# Patient Record
Sex: Female | Born: 1935
Health system: Southern US, Community
[De-identification: ages and names within clinical notes are randomized; demographics above are authoritative.]

## PROBLEM LIST (undated history)

## (undated) DIAGNOSIS — K625 Hemorrhage of anus and rectum: Secondary | ICD-10-CM

## (undated) DIAGNOSIS — G4700 Insomnia, unspecified: Secondary | ICD-10-CM

## (undated) DIAGNOSIS — H9319 Tinnitus, unspecified ear: Secondary | ICD-10-CM

## (undated) DIAGNOSIS — D638 Anemia in other chronic diseases classified elsewhere: Secondary | ICD-10-CM

## (undated) DIAGNOSIS — K631 Perforation of intestine (nontraumatic): Secondary | ICD-10-CM

## (undated) DIAGNOSIS — I10 Essential (primary) hypertension: Secondary | ICD-10-CM

## (undated) DIAGNOSIS — K5790 Diverticulosis of intestine, part unspecified, without perforation or abscess without bleeding: Secondary | ICD-10-CM

## (undated) DIAGNOSIS — D649 Anemia, unspecified: Secondary | ICD-10-CM

## (undated) DIAGNOSIS — F419 Anxiety disorder, unspecified: Secondary | ICD-10-CM

## (undated) HISTORY — DX: Anemia, unspecified: D64.9

## (undated) HISTORY — DX: Anxiety disorder, unspecified: F41.9

## (undated) HISTORY — PX: COLONOSCOPY: SHX174

## (undated) HISTORY — DX: Essential (primary) hypertension: I10

## (undated) HISTORY — DX: Anemia in other chronic diseases classified elsewhere: D63.8

## (undated) HISTORY — DX: Diverticulosis of intestine, part unspecified, without perforation or abscess without bleeding: K57.90

## (undated) HISTORY — PX: PARTIAL HYSTERECTOMY: SHX80

## (undated) HISTORY — DX: Insomnia, unspecified: G47.00

---

## 2001-04-29 DIAGNOSIS — K5790 Diverticulosis of intestine, part unspecified, without perforation or abscess without bleeding: Secondary | ICD-10-CM

## 2001-04-29 HISTORY — DX: Diverticulosis of intestine, part unspecified, without perforation or abscess without bleeding: K57.90

## 2001-05-20 ENCOUNTER — Encounter (INDEPENDENT_AMBULATORY_CARE_PROVIDER_SITE_OTHER): Payer: Self-pay | Admitting: Specialist

## 2001-05-20 ENCOUNTER — Other Ambulatory Visit: Admission: RE | Admit: 2001-05-20 | Discharge: 2001-05-20 | Payer: Self-pay | Admitting: Gastroenterology

## 2005-03-27 ENCOUNTER — Inpatient Hospital Stay (HOSPITAL_COMMUNITY): Admission: EM | Admit: 2005-03-27 | Discharge: 2005-03-30 | Payer: Self-pay | Admitting: Emergency Medicine

## 2005-03-28 ENCOUNTER — Ambulatory Visit: Payer: Self-pay | Admitting: Internal Medicine

## 2005-04-09 ENCOUNTER — Ambulatory Visit: Payer: Self-pay | Admitting: Gastroenterology

## 2005-05-14 ENCOUNTER — Ambulatory Visit: Payer: Self-pay | Admitting: Gastroenterology

## 2005-06-12 ENCOUNTER — Ambulatory Visit: Payer: Self-pay | Admitting: Gastroenterology

## 2005-06-12 LAB — CONVERTED CEMR LAB
Fecal Occult Blood: NEGATIVE
OCCULT 1: NEGATIVE
OCCULT 2: NEGATIVE
OCCULT 3: NEGATIVE
OCCULT 4: NEGATIVE
OCCULT 5: NEGATIVE

## 2007-03-08 ENCOUNTER — Ambulatory Visit: Payer: Self-pay | Admitting: Internal Medicine

## 2007-03-09 LAB — CONVERTED CEMR LAB
ALT: 14 units/L (ref 0–40)
AST: 17 units/L (ref 0–37)
Albumin: 3.3 g/dL — ABNORMAL LOW (ref 3.5–5.2)
Alkaline Phosphatase: 40 units/L (ref 39–117)
Basophils Absolute: 0 10*3/uL (ref 0.0–0.1)
Basophils Relative: 0.1 % (ref 0.0–1.0)
Bilirubin, Direct: 0.2 mg/dL (ref 0.0–0.3)
Crystals: NEGATIVE
Eosinophils Relative: 1.3 % (ref 0.0–5.0)
HCT: 31.5 % — ABNORMAL LOW (ref 36.0–46.0)
Hemoglobin, Urine: NEGATIVE
Hemoglobin: 10.7 g/dL — ABNORMAL LOW (ref 12.0–15.0)
LDL Cholesterol: 92 mg/dL (ref 0–99)
Lymphocytes Relative: 18.2 % (ref 12.0–46.0)
MCHC: 33.9 g/dL (ref 30.0–36.0)
Monocytes Relative: 7.8 % (ref 3.0–11.0)
Neutrophils Relative %: 72.6 % (ref 43.0–77.0)
Nitrite: NEGATIVE
Platelets: 311 10*3/uL (ref 150–400)
TSH: 0.97 microintl units/mL (ref 0.35–5.50)
Total Bilirubin: 1.2 mg/dL (ref 0.3–1.2)
Triglycerides: 37 mg/dL (ref 0–149)
Urobilinogen, UA: 0.2 (ref 0.0–1.0)

## 2007-05-28 ENCOUNTER — Ambulatory Visit: Payer: Self-pay | Admitting: Internal Medicine

## 2007-05-28 LAB — CONVERTED CEMR LAB: Creatinine, Ser: 0.8 mg/dL (ref 0.4–1.2)

## 2007-06-02 ENCOUNTER — Ambulatory Visit: Payer: Self-pay | Admitting: Internal Medicine

## 2007-06-07 ENCOUNTER — Ambulatory Visit: Payer: Self-pay | Admitting: Internal Medicine

## 2007-06-07 LAB — CONVERTED CEMR LAB
Albumin ELP: 48.5 % — ABNORMAL LOW (ref 55.8–66.1)
Alpha-1-Globulin: 4.8 % (ref 2.9–4.9)
BUN: 12 mg/dL (ref 6–23)
Beta Globulin: 5.4 % (ref 4.7–7.2)
Calcium: 9.6 mg/dL (ref 8.4–10.5)
Chloride: 107 meq/L (ref 96–112)
Creatinine, Ser: 0.7 mg/dL (ref 0.4–1.2)
Potassium: 4.1 meq/L (ref 3.5–5.1)
Total Protein, Serum Electrophoresis: 8.6 g/dL — ABNORMAL HIGH (ref 6.0–8.3)
Vitamin B-12: 1476 pg/mL — ABNORMAL HIGH (ref 211–911)

## 2007-06-19 ENCOUNTER — Emergency Department (HOSPITAL_COMMUNITY): Admission: EM | Admit: 2007-06-19 | Discharge: 2007-06-19 | Payer: Self-pay | Admitting: Internal Medicine

## 2007-06-21 ENCOUNTER — Emergency Department (HOSPITAL_COMMUNITY): Admission: EM | Admit: 2007-06-21 | Discharge: 2007-06-21 | Payer: Self-pay | Admitting: *Deleted

## 2007-06-22 ENCOUNTER — Inpatient Hospital Stay (HOSPITAL_COMMUNITY): Admission: EM | Admit: 2007-06-22 | Discharge: 2007-06-24 | Payer: Self-pay | Admitting: Emergency Medicine

## 2007-06-22 ENCOUNTER — Ambulatory Visit: Payer: Self-pay | Admitting: Internal Medicine

## 2007-07-06 ENCOUNTER — Encounter: Admission: RE | Admit: 2007-07-06 | Discharge: 2007-07-06 | Payer: Self-pay | Admitting: Internal Medicine

## 2007-07-08 ENCOUNTER — Ambulatory Visit: Payer: Self-pay | Admitting: Internal Medicine

## 2007-07-09 ENCOUNTER — Ambulatory Visit: Payer: Self-pay | Admitting: Oncology

## 2007-07-22 ENCOUNTER — Telehealth: Payer: Self-pay | Admitting: Internal Medicine

## 2007-07-28 ENCOUNTER — Encounter (INDEPENDENT_AMBULATORY_CARE_PROVIDER_SITE_OTHER): Payer: Self-pay | Admitting: Diagnostic Radiology

## 2007-07-28 ENCOUNTER — Ambulatory Visit (HOSPITAL_COMMUNITY): Admission: RE | Admit: 2007-07-28 | Discharge: 2007-07-28 | Payer: Self-pay | Admitting: Internal Medicine

## 2007-07-28 ENCOUNTER — Encounter: Payer: Self-pay | Admitting: Internal Medicine

## 2007-08-02 ENCOUNTER — Telehealth: Payer: Self-pay | Admitting: Internal Medicine

## 2007-08-11 LAB — CBC WITH DIFFERENTIAL/PLATELET
BASO%: 0.7 % (ref 0.0–2.0)
Basophils Absolute: 0 10*3/uL (ref 0.0–0.1)
EOS%: 0.7 % (ref 0.0–7.0)
HGB: 11.3 g/dL — ABNORMAL LOW (ref 11.6–15.9)
MCH: 30.3 pg (ref 26.0–34.0)
MCHC: 33.6 g/dL (ref 32.0–36.0)
MCV: 90.3 fL (ref 81.0–101.0)
MONO%: 8.5 % (ref 0.0–13.0)
NEUT%: 72.5 % (ref 39.6–76.8)
RDW: 15 % — ABNORMAL HIGH (ref 11.3–14.5)

## 2007-08-11 LAB — MORPHOLOGY

## 2007-08-13 LAB — COMPREHENSIVE METABOLIC PANEL
Albumin: 4.1 g/dL (ref 3.5–5.2)
CO2: 28 mEq/L (ref 19–32)
Glucose, Bld: 90 mg/dL (ref 70–99)
Potassium: 3.8 mEq/L (ref 3.5–5.3)
Sodium: 139 mEq/L (ref 135–145)
Total Protein: 8.3 g/dL (ref 6.0–8.3)

## 2007-08-13 LAB — KAPPA/LAMBDA LIGHT CHAINS: Kappa free light chain: 1.65 mg/dL (ref 0.33–1.94)

## 2007-08-13 LAB — LACTATE DEHYDROGENASE: LDH: 154 U/L (ref 94–250)

## 2007-08-13 LAB — BETA 2 MICROGLOBULIN, SERUM: Beta-2 Microglobulin: 1.79 mg/L — ABNORMAL HIGH (ref 1.01–1.73)

## 2007-08-13 LAB — IMMUNOFIXATION ELECTROPHORESIS: Total Protein, Serum Electrophoresis: 8.3 g/dL (ref 6.0–8.3)

## 2007-08-16 ENCOUNTER — Encounter: Payer: Self-pay | Admitting: Internal Medicine

## 2007-08-16 ENCOUNTER — Ambulatory Visit (HOSPITAL_COMMUNITY): Admission: RE | Admit: 2007-08-16 | Discharge: 2007-08-16 | Payer: Self-pay | Admitting: Oncology

## 2007-08-18 LAB — UIFE/LIGHT CHAINS/TP QN, 24-HR UR
Albumin, U: DETECTED
Alpha 1, Urine: DETECTED — AB
Alpha 2, Urine: DETECTED — AB
Free Lambda Excretion/Day: 1.8 mg/d
Gamma Globulin, Urine: DETECTED — AB
Time: 24 hours

## 2007-09-10 ENCOUNTER — Ambulatory Visit: Payer: Self-pay | Admitting: Oncology

## 2007-10-22 ENCOUNTER — Encounter: Payer: Self-pay | Admitting: Internal Medicine

## 2007-10-28 ENCOUNTER — Telehealth: Payer: Self-pay | Admitting: Internal Medicine

## 2007-11-04 ENCOUNTER — Ambulatory Visit (HOSPITAL_COMMUNITY): Admission: RE | Admit: 2007-11-04 | Discharge: 2007-11-04 | Payer: Self-pay | Admitting: Oncology

## 2007-11-04 ENCOUNTER — Ambulatory Visit: Payer: Self-pay | Admitting: Oncology

## 2007-11-04 ENCOUNTER — Encounter: Payer: Self-pay | Admitting: Oncology

## 2007-11-04 ENCOUNTER — Encounter: Payer: Self-pay | Admitting: Internal Medicine

## 2007-11-17 ENCOUNTER — Ambulatory Visit: Payer: Self-pay | Admitting: Oncology

## 2007-11-19 LAB — COMPREHENSIVE METABOLIC PANEL
Albumin: 4.2 g/dL (ref 3.5–5.2)
CO2: 26 mEq/L (ref 19–32)
Glucose, Bld: 103 mg/dL — ABNORMAL HIGH (ref 70–99)
Potassium: 3.7 mEq/L (ref 3.5–5.3)
Sodium: 140 mEq/L (ref 135–145)
Total Protein: 8.6 g/dL — ABNORMAL HIGH (ref 6.0–8.3)

## 2007-11-19 LAB — CBC WITH DIFFERENTIAL/PLATELET
Eosinophils Absolute: 0 10*3/uL (ref 0.0–0.5)
HGB: 12.1 g/dL (ref 11.6–15.9)
LYMPH%: 15.3 % (ref 14.0–48.0)
MONO#: 0.3 10*3/uL (ref 0.1–0.9)
NEUT#: 4.3 10*3/uL (ref 1.5–6.5)
Platelets: 295 10*3/uL (ref 145–400)
RBC: 4.03 10*6/uL (ref 3.70–5.32)
RDW: 14.3 % (ref 11.3–14.5)
WBC: 5.4 10*3/uL (ref 3.9–10.0)

## 2007-11-25 ENCOUNTER — Encounter: Payer: Self-pay | Admitting: Internal Medicine

## 2007-11-25 DIAGNOSIS — F062 Psychotic disorder with delusions due to known physiological condition: Secondary | ICD-10-CM

## 2007-11-25 DIAGNOSIS — I10 Essential (primary) hypertension: Secondary | ICD-10-CM

## 2007-12-16 ENCOUNTER — Telehealth: Payer: Self-pay | Admitting: Internal Medicine

## 2008-02-29 ENCOUNTER — Telehealth (INDEPENDENT_AMBULATORY_CARE_PROVIDER_SITE_OTHER): Payer: Self-pay | Admitting: *Deleted

## 2008-03-07 ENCOUNTER — Ambulatory Visit: Payer: Self-pay | Admitting: Oncology

## 2008-03-08 ENCOUNTER — Ambulatory Visit: Payer: Self-pay | Admitting: Internal Medicine

## 2008-03-08 DIAGNOSIS — C9000 Multiple myeloma not having achieved remission: Secondary | ICD-10-CM | POA: Insufficient documentation

## 2008-03-08 DIAGNOSIS — D649 Anemia, unspecified: Secondary | ICD-10-CM

## 2008-03-08 DIAGNOSIS — G4700 Insomnia, unspecified: Secondary | ICD-10-CM

## 2008-03-20 ENCOUNTER — Ambulatory Visit: Payer: Self-pay | Admitting: Internal Medicine

## 2008-03-20 DIAGNOSIS — R42 Dizziness and giddiness: Secondary | ICD-10-CM

## 2008-03-20 DIAGNOSIS — R000 Tachycardia, unspecified: Secondary | ICD-10-CM

## 2008-03-21 ENCOUNTER — Encounter: Payer: Self-pay | Admitting: Internal Medicine

## 2008-03-21 DIAGNOSIS — F411 Generalized anxiety disorder: Secondary | ICD-10-CM

## 2008-03-21 LAB — CBC WITH DIFFERENTIAL/PLATELET
BASO%: 0 % (ref 0.0–2.0)
Basophils Absolute: 0 10*3/uL (ref 0.0–0.1)
EOS%: 0 % (ref 0.0–7.0)
HCT: 37.5 % (ref 34.8–46.6)
HGB: 12.4 g/dL (ref 11.6–15.9)
LYMPH%: 2.9 % — ABNORMAL LOW (ref 14.0–48.0)
MCH: 29.6 pg (ref 26.0–34.0)
MCHC: 33.1 g/dL (ref 32.0–36.0)
MCV: 89.3 fL (ref 81.0–101.0)
MONO%: 0.7 % (ref 0.0–13.0)
NEUT%: 96.4 % — ABNORMAL HIGH (ref 39.6–76.8)
Platelets: 291 10*3/uL (ref 145–400)

## 2008-03-21 LAB — COMPREHENSIVE METABOLIC PANEL
Albumin: 4.1 g/dL (ref 3.5–5.2)
Alkaline Phosphatase: 53 U/L (ref 39–117)
CO2: 25 mEq/L (ref 19–32)
Calcium: 9.7 mg/dL (ref 8.4–10.5)
Chloride: 104 mEq/L (ref 96–112)
Glucose, Bld: 117 mg/dL — ABNORMAL HIGH (ref 70–99)
Potassium: 3.6 mEq/L (ref 3.5–5.3)
Sodium: 141 mEq/L (ref 135–145)
Total Protein: 7.7 g/dL (ref 6.0–8.3)

## 2008-03-21 LAB — MORPHOLOGY

## 2008-03-21 LAB — LACTATE DEHYDROGENASE: LDH: 156 U/L (ref 94–250)

## 2008-03-22 ENCOUNTER — Encounter: Payer: Self-pay | Admitting: Internal Medicine

## 2008-03-24 LAB — IMMUNOFIXATION ELECTROPHORESIS
IgG (Immunoglobin G), Serum: 1770 mg/dL — ABNORMAL HIGH (ref 694–1618)
Total Protein, Serum Electrophoresis: 7.5 g/dL (ref 6.0–8.3)

## 2008-03-24 LAB — KAPPA/LAMBDA LIGHT CHAINS: Kappa free light chain: 2 mg/dL — ABNORMAL HIGH (ref 0.33–1.94)

## 2008-03-29 ENCOUNTER — Telehealth: Payer: Self-pay | Admitting: Internal Medicine

## 2008-04-14 ENCOUNTER — Ambulatory Visit: Payer: Self-pay | Admitting: Oncology

## 2008-04-21 ENCOUNTER — Encounter: Payer: Self-pay | Admitting: Internal Medicine

## 2008-04-21 LAB — MORPHOLOGY

## 2008-04-21 LAB — CBC WITH DIFFERENTIAL/PLATELET
EOS%: 0.4 % (ref 0.0–7.0)
Eosinophils Absolute: 0 10*3/uL (ref 0.0–0.5)
MCV: 90.6 fL (ref 81.0–101.0)
MONO%: 10.1 % (ref 0.0–13.0)
NEUT#: 2.7 10*3/uL (ref 1.5–6.5)
RBC: 3.66 10*6/uL — ABNORMAL LOW (ref 3.70–5.32)
RDW: 16.5 % — ABNORMAL HIGH (ref 11.3–14.5)

## 2008-04-25 LAB — IMMUNOFIXATION ELECTROPHORESIS
IgA: 332 mg/dL (ref 68–378)
IgM, Serum: 27 mg/dL — ABNORMAL LOW (ref 60–263)
Total Protein, Serum Electrophoresis: 6.6 g/dL (ref 6.0–8.3)

## 2008-04-25 LAB — COMPREHENSIVE METABOLIC PANEL
AST: 12 U/L (ref 0–37)
Alkaline Phosphatase: 49 U/L (ref 39–117)
BUN: 12 mg/dL (ref 6–23)
Creatinine, Ser: 0.69 mg/dL (ref 0.40–1.20)
Potassium: 3.8 mEq/L (ref 3.5–5.3)
Total Bilirubin: 1.2 mg/dL (ref 0.3–1.2)

## 2008-04-25 LAB — LACTATE DEHYDROGENASE: LDH: 140 U/L (ref 94–250)

## 2008-05-05 ENCOUNTER — Encounter: Payer: Self-pay | Admitting: Internal Medicine

## 2008-05-05 LAB — CBC WITH DIFFERENTIAL/PLATELET
BASO%: 0.8 % (ref 0.0–2.0)
EOS%: 0.4 % (ref 0.0–7.0)
MCH: 30.5 pg (ref 26.0–34.0)
MCHC: 33.3 g/dL (ref 32.0–36.0)
RDW: 16.6 % — ABNORMAL HIGH (ref 11.3–14.5)
WBC: 3.3 10*3/uL — ABNORMAL LOW (ref 3.9–10.0)
lymph#: 0.4 10*3/uL — ABNORMAL LOW (ref 0.9–3.3)

## 2008-05-16 ENCOUNTER — Encounter: Payer: Self-pay | Admitting: Internal Medicine

## 2008-05-16 LAB — CBC WITH DIFFERENTIAL/PLATELET
BASO%: 0.1 % (ref 0.0–2.0)
Basophils Absolute: 0 10e3/uL (ref 0.0–0.1)
EOS%: 0.2 % (ref 0.0–7.0)
Eosinophils Absolute: 0 10e3/uL (ref 0.0–0.5)
HCT: 37.1 % (ref 34.8–46.6)
HGB: 12.2 g/dL (ref 11.6–15.9)
LYMPH%: 6.3 % — ABNORMAL LOW (ref 14.0–48.0)
MCH: 30.7 pg (ref 26.0–34.0)
MCHC: 33 g/dL (ref 32.0–36.0)
MCV: 92.9 fL (ref 81.0–101.0)
MONO#: 0.2 10e3/uL (ref 0.1–0.9)
MONO%: 4.5 % (ref 0.0–13.0)
NEUT#: 4.8 10e3/uL (ref 1.5–6.5)
NEUT%: 88.9 % — ABNORMAL HIGH (ref 39.6–76.8)
Platelets: 197 10e3/uL (ref 145–400)
RBC: 3.99 10e6/uL (ref 3.70–5.32)
RDW: 16.6 % — ABNORMAL HIGH (ref 11.3–14.5)
WBC: 5.4 10e3/uL (ref 3.9–10.0)
lymph#: 0.3 10e3/uL — ABNORMAL LOW (ref 0.9–3.3)

## 2008-05-16 LAB — BASIC METABOLIC PANEL WITH GFR
BUN: 21 mg/dL (ref 6–23)
CO2: 24 meq/L (ref 19–32)
Calcium: 9.4 mg/dL (ref 8.4–10.5)
Chloride: 107 meq/L (ref 96–112)
Creatinine, Ser: 0.97 mg/dL (ref 0.40–1.20)
Glucose, Bld: 103 mg/dL — ABNORMAL HIGH (ref 70–99)
Potassium: 3.7 meq/L (ref 3.5–5.3)
Sodium: 143 meq/L (ref 135–145)

## 2008-06-06 ENCOUNTER — Ambulatory Visit: Payer: Self-pay | Admitting: Oncology

## 2008-06-09 ENCOUNTER — Encounter: Payer: Self-pay | Admitting: Internal Medicine

## 2008-06-09 LAB — CBC WITH DIFFERENTIAL/PLATELET
BASO%: 0.3 % (ref 0.0–2.0)
LYMPH%: 14.8 % (ref 14.0–48.0)
MCHC: 33.5 g/dL (ref 32.0–36.0)
MONO#: 0.3 10*3/uL (ref 0.1–0.9)
Platelets: 201 10*3/uL (ref 145–400)
RBC: 3.74 10*6/uL (ref 3.70–5.32)
RDW: 16.2 % — ABNORMAL HIGH (ref 11.3–14.5)
WBC: 3.1 10*3/uL — ABNORMAL LOW (ref 3.9–10.0)

## 2008-06-13 LAB — COMPREHENSIVE METABOLIC PANEL
ALT: 8 U/L (ref 0–35)
AST: 15 U/L (ref 0–37)
Albumin: 4.4 g/dL (ref 3.5–5.2)
Alkaline Phosphatase: 60 U/L (ref 39–117)
Glucose, Bld: 122 mg/dL — ABNORMAL HIGH (ref 70–99)
Potassium: 3.9 mEq/L (ref 3.5–5.3)
Sodium: 143 mEq/L (ref 135–145)
Total Bilirubin: 0.9 mg/dL (ref 0.3–1.2)
Total Protein: 7.7 g/dL (ref 6.0–8.3)

## 2008-06-13 LAB — KAPPA/LAMBDA LIGHT CHAINS
Kappa free light chain: 1.56 mg/dL (ref 0.33–1.94)
Kappa:Lambda Ratio: 0.67 (ref 0.26–1.65)

## 2008-06-13 LAB — IMMUNOFIXATION ELECTROPHORESIS
IgM, Serum: 27 mg/dL — ABNORMAL LOW (ref 60–263)
Total Protein, Serum Electrophoresis: 7.7 g/dL (ref 6.0–8.3)

## 2008-06-16 LAB — CBC WITH DIFFERENTIAL/PLATELET
BASO%: 0.2 % (ref 0.0–2.0)
EOS%: 0 % (ref 0.0–7.0)
HCT: 34.4 % — ABNORMAL LOW (ref 34.8–46.6)
LYMPH%: 3.8 % — ABNORMAL LOW (ref 14.0–48.0)
MCH: 31 pg (ref 26.0–34.0)
MCHC: 33.3 g/dL (ref 32.0–36.0)
NEUT%: 90.4 % — ABNORMAL HIGH (ref 39.6–76.8)
RBC: 3.7 10*6/uL (ref 3.70–5.32)
lymph#: 0.3 10*3/uL — ABNORMAL LOW (ref 0.9–3.3)

## 2008-06-30 LAB — CBC WITH DIFFERENTIAL/PLATELET
BASO%: 0.1 % (ref 0.0–2.0)
EOS%: 0.3 % (ref 0.0–7.0)
HCT: 33.9 % — ABNORMAL LOW (ref 34.8–46.6)
MCH: 31.5 pg (ref 26.0–34.0)
MCHC: 33.7 g/dL (ref 32.0–36.0)
NEUT%: 80.4 % — ABNORMAL HIGH (ref 39.6–76.8)
lymph#: 0.3 10*3/uL — ABNORMAL LOW (ref 0.9–3.3)

## 2008-06-30 LAB — MORPHOLOGY

## 2008-07-12 ENCOUNTER — Encounter: Payer: Self-pay | Admitting: Internal Medicine

## 2008-07-12 LAB — CBC WITH DIFFERENTIAL/PLATELET
Basophils Absolute: 0 10*3/uL (ref 0.0–0.1)
EOS%: 0.2 % (ref 0.0–7.0)
HCT: 35.7 % (ref 34.8–46.6)
HGB: 11.8 g/dL (ref 11.6–15.9)
MCH: 31.1 pg (ref 26.0–34.0)
MCV: 94.2 fL (ref 81.0–101.0)
MONO%: 4.3 % (ref 0.0–13.0)
NEUT%: 86.9 % — ABNORMAL HIGH (ref 39.6–76.8)

## 2008-07-14 LAB — COMPREHENSIVE METABOLIC PANEL
ALT: 13 U/L (ref 0–35)
AST: 17 U/L (ref 0–37)
Albumin: 4.5 g/dL (ref 3.5–5.2)
Alkaline Phosphatase: 56 U/L (ref 39–117)
Potassium: 3.8 mEq/L (ref 3.5–5.3)
Sodium: 143 mEq/L (ref 135–145)
Total Protein: 7.4 g/dL (ref 6.0–8.3)

## 2008-07-14 LAB — KAPPA/LAMBDA LIGHT CHAINS
Kappa free light chain: 0.81 mg/dL (ref 0.33–1.94)
Kappa:Lambda Ratio: 0.61 (ref 0.26–1.65)
Lambda Free Lght Chn: 1.32 mg/dL (ref 0.57–2.63)

## 2008-07-14 LAB — IMMUNOFIXATION ELECTROPHORESIS

## 2008-07-31 ENCOUNTER — Ambulatory Visit: Payer: Self-pay | Admitting: Oncology

## 2008-08-02 LAB — CBC WITH DIFFERENTIAL/PLATELET
BASO%: 0.2 % (ref 0.0–2.0)
Eosinophils Absolute: 0 10*3/uL (ref 0.0–0.5)
HCT: 33.9 % — ABNORMAL LOW (ref 34.8–46.6)
LYMPH%: 10 % — ABNORMAL LOW (ref 14.0–48.0)
MCHC: 33.7 g/dL (ref 32.0–36.0)
MCV: 94.2 fL (ref 81.0–101.0)
MONO#: 0.3 10*3/uL (ref 0.1–0.9)
MONO%: 10 % (ref 0.0–13.0)
NEUT%: 79.6 % — ABNORMAL HIGH (ref 39.6–76.8)
Platelets: 215 10*3/uL (ref 145–400)
WBC: 2.6 10*3/uL — ABNORMAL LOW (ref 3.9–10.0)

## 2008-08-03 LAB — COMPREHENSIVE METABOLIC PANEL
Albumin: 4.3 g/dL (ref 3.5–5.2)
BUN: 14 mg/dL (ref 6–23)
CO2: 25 mEq/L (ref 19–32)
Glucose, Bld: 114 mg/dL — ABNORMAL HIGH (ref 70–99)
Potassium: 3.8 mEq/L (ref 3.5–5.3)
Sodium: 142 mEq/L (ref 135–145)
Total Protein: 7.1 g/dL (ref 6.0–8.3)

## 2008-08-03 LAB — IGG, IGA, IGM
IgA: 328 mg/dL (ref 68–378)
IgG (Immunoglobin G), Serum: 1090 mg/dL (ref 694–1618)
IgM, Serum: 23 mg/dL — ABNORMAL LOW (ref 60–263)

## 2008-08-03 LAB — LACTATE DEHYDROGENASE: LDH: 170 U/L (ref 94–250)

## 2008-08-03 LAB — KAPPA/LAMBDA LIGHT CHAINS
Kappa:Lambda Ratio: 1.28 (ref 0.26–1.65)
Lambda Free Lght Chn: 1.51 mg/dL (ref 0.57–2.63)

## 2008-08-12 ENCOUNTER — Encounter: Payer: Self-pay | Admitting: Internal Medicine

## 2008-08-16 ENCOUNTER — Telehealth: Payer: Self-pay | Admitting: Internal Medicine

## 2008-09-26 ENCOUNTER — Telehealth: Payer: Self-pay | Admitting: Internal Medicine

## 2008-10-02 ENCOUNTER — Ambulatory Visit: Payer: Self-pay | Admitting: Oncology

## 2008-10-04 LAB — CBC WITH DIFFERENTIAL/PLATELET
BASO%: 0.6 % (ref 0.0–2.0)
Basophils Absolute: 0 10*3/uL (ref 0.0–0.1)
EOS%: 0.4 % (ref 0.0–7.0)
HGB: 11.7 g/dL (ref 11.6–15.9)
MCH: 32 pg (ref 26.0–34.0)
MCHC: 33.3 g/dL (ref 32.0–36.0)
MCV: 96.1 fL (ref 81.0–101.0)
MONO%: 7.9 % (ref 0.0–13.0)
NEUT%: 77.5 % — ABNORMAL HIGH (ref 39.6–76.8)
RDW: 14.1 % (ref 11.3–14.5)
lymph#: 0.4 10*3/uL — ABNORMAL LOW (ref 0.9–3.3)

## 2008-10-04 LAB — MORPHOLOGY

## 2008-10-06 LAB — IGG, IGA, IGM
IgA: 397 mg/dL — ABNORMAL HIGH (ref 68–378)
IgG (Immunoglobin G), Serum: 1270 mg/dL (ref 694–1618)
IgM, Serum: 35 mg/dL — ABNORMAL LOW (ref 60–263)

## 2008-10-06 LAB — COMPREHENSIVE METABOLIC PANEL
Albumin: 4.5 g/dL (ref 3.5–5.2)
BUN: 15 mg/dL (ref 6–23)
CO2: 27 mEq/L (ref 19–32)
Glucose, Bld: 112 mg/dL — ABNORMAL HIGH (ref 70–99)
Potassium: 3.8 mEq/L (ref 3.5–5.3)
Sodium: 145 mEq/L (ref 135–145)
Total Protein: 7.4 g/dL (ref 6.0–8.3)

## 2008-10-06 LAB — KAPPA/LAMBDA LIGHT CHAINS: Kappa free light chain: 2.54 mg/dL — ABNORMAL HIGH (ref 0.33–1.94)

## 2008-11-29 ENCOUNTER — Ambulatory Visit: Payer: Self-pay | Admitting: Oncology

## 2008-12-01 ENCOUNTER — Encounter: Payer: Self-pay | Admitting: Internal Medicine

## 2008-12-01 LAB — CBC WITH DIFFERENTIAL/PLATELET
Basophils Absolute: 0 10*3/uL (ref 0.0–0.1)
Eosinophils Absolute: 0 10*3/uL (ref 0.0–0.5)
HCT: 35.2 % (ref 34.8–46.6)
HGB: 11.7 g/dL (ref 11.6–15.9)
MCV: 94.8 fL (ref 79.5–101.0)
MONO%: 7 % (ref 0.0–14.0)
NEUT#: 2.6 10*3/uL (ref 1.5–6.5)
NEUT%: 79.9 % — ABNORMAL HIGH (ref 38.4–76.8)
RDW: 13.7 % (ref 11.2–14.5)
lymph#: 0.4 10*3/uL — ABNORMAL LOW (ref 0.9–3.3)

## 2008-12-01 LAB — MORPHOLOGY: PLT EST: ADEQUATE

## 2008-12-05 LAB — COMPREHENSIVE METABOLIC PANEL
ALT: 12 U/L (ref 0–35)
Alkaline Phosphatase: 56 U/L (ref 39–117)
Potassium: 3.6 mEq/L (ref 3.5–5.3)
Sodium: 141 mEq/L (ref 135–145)
Total Bilirubin: 0.9 mg/dL (ref 0.3–1.2)
Total Protein: 7.1 g/dL (ref 6.0–8.3)

## 2008-12-05 LAB — IMMUNOFIXATION ELECTROPHORESIS
IgG (Immunoglobin G), Serum: 1290 mg/dL (ref 694–1618)
IgM, Serum: 29 mg/dL — ABNORMAL LOW (ref 60–263)
Total Protein, Serum Electrophoresis: 7.1 g/dL (ref 6.0–8.3)

## 2008-12-05 LAB — KAPPA/LAMBDA LIGHT CHAINS: Kappa:Lambda Ratio: 0.82 (ref 0.26–1.65)

## 2008-12-22 ENCOUNTER — Ambulatory Visit (HOSPITAL_COMMUNITY): Admission: RE | Admit: 2008-12-22 | Discharge: 2008-12-22 | Payer: Self-pay | Admitting: Oncology

## 2009-01-03 ENCOUNTER — Telehealth: Payer: Self-pay | Admitting: Internal Medicine

## 2009-01-08 ENCOUNTER — Telehealth: Payer: Self-pay | Admitting: Internal Medicine

## 2009-02-07 ENCOUNTER — Ambulatory Visit: Payer: Self-pay | Admitting: Oncology

## 2009-02-09 LAB — BASIC METABOLIC PANEL
BUN: 13 mg/dL (ref 6–23)
CO2: 28 mEq/L (ref 19–32)
Chloride: 106 mEq/L (ref 96–112)
Creatinine, Ser: 0.7 mg/dL (ref 0.40–1.20)
Glucose, Bld: 141 mg/dL — ABNORMAL HIGH (ref 70–99)
Potassium: 3.7 mEq/L (ref 3.5–5.3)

## 2009-02-09 LAB — CBC WITH DIFFERENTIAL/PLATELET
Basophils Absolute: 0 10*3/uL (ref 0.0–0.1)
EOS%: 0.6 % (ref 0.0–7.0)
Eosinophils Absolute: 0 10*3/uL (ref 0.0–0.5)
HCT: 35.5 % (ref 34.8–46.6)
HGB: 11.7 g/dL (ref 11.6–15.9)
MCH: 31 pg (ref 25.1–34.0)
MCV: 94.3 fL (ref 79.5–101.0)
NEUT#: 3.1 10*3/uL (ref 1.5–6.5)
NEUT%: 80.3 % — ABNORMAL HIGH (ref 38.4–76.8)
RDW: 14.3 % (ref 11.2–14.5)
lymph#: 0.5 10*3/uL — ABNORMAL LOW (ref 0.9–3.3)

## 2009-02-12 LAB — IGG, IGA, IGM: IgA: 381 mg/dL — ABNORMAL HIGH (ref 68–378)

## 2009-02-12 LAB — KAPPA/LAMBDA LIGHT CHAINS
Kappa free light chain: 1.66 mg/dL (ref 0.33–1.94)
Lambda Free Lght Chn: 1.72 mg/dL (ref 0.57–2.63)

## 2009-03-14 ENCOUNTER — Telehealth: Payer: Self-pay | Admitting: Internal Medicine

## 2009-03-28 ENCOUNTER — Ambulatory Visit: Payer: Self-pay | Admitting: Oncology

## 2009-04-06 ENCOUNTER — Encounter: Payer: Self-pay | Admitting: Internal Medicine

## 2009-04-06 LAB — COMPREHENSIVE METABOLIC PANEL
Albumin: 3.9 g/dL (ref 3.5–5.2)
BUN: 13 mg/dL (ref 6–23)
Calcium: 9.5 mg/dL (ref 8.4–10.5)
Chloride: 104 mEq/L (ref 96–112)
Glucose, Bld: 132 mg/dL — ABNORMAL HIGH (ref 70–99)
Potassium: 3.5 mEq/L (ref 3.5–5.3)
Sodium: 137 mEq/L (ref 135–145)
Total Protein: 8 g/dL (ref 6.0–8.3)

## 2009-04-06 LAB — CBC WITH DIFFERENTIAL/PLATELET
BASO%: 0.3 % (ref 0.0–2.0)
EOS%: 0.5 % (ref 0.0–7.0)
HCT: 35 % (ref 34.8–46.6)
MCH: 30.9 pg (ref 25.1–34.0)
MCHC: 33 g/dL (ref 31.5–36.0)
MONO#: 0.3 10*3/uL (ref 0.1–0.9)
NEUT%: 82.4 % — ABNORMAL HIGH (ref 38.4–76.8)
RDW: 14 % (ref 11.2–14.5)
WBC: 5.1 10*3/uL (ref 3.9–10.3)
lymph#: 0.6 10*3/uL — ABNORMAL LOW (ref 0.9–3.3)

## 2009-04-09 LAB — IGG, IGA, IGM
IgA: 404 mg/dL — ABNORMAL HIGH (ref 68–378)
IgG (Immunoglobin G), Serum: 1290 mg/dL (ref 694–1618)

## 2009-04-09 LAB — KAPPA/LAMBDA LIGHT CHAINS: Kappa:Lambda Ratio: 0.88 (ref 0.26–1.65)

## 2009-04-13 ENCOUNTER — Telehealth: Payer: Self-pay | Admitting: Internal Medicine

## 2009-04-16 ENCOUNTER — Telehealth: Payer: Self-pay | Admitting: Internal Medicine

## 2009-05-31 ENCOUNTER — Telehealth: Payer: Self-pay | Admitting: Internal Medicine

## 2009-05-31 DIAGNOSIS — H9190 Unspecified hearing loss, unspecified ear: Secondary | ICD-10-CM | POA: Insufficient documentation

## 2009-06-06 ENCOUNTER — Ambulatory Visit: Payer: Self-pay | Admitting: Oncology

## 2009-06-14 LAB — CBC WITH DIFFERENTIAL/PLATELET
BASO%: 0.3 % (ref 0.0–2.0)
LYMPH%: 14.9 % (ref 14.0–49.7)
MCHC: 33 g/dL (ref 31.5–36.0)
MONO#: 0.3 10*3/uL (ref 0.1–0.9)
RBC: 3.73 10*6/uL (ref 3.70–5.45)
WBC: 4 10*3/uL (ref 3.9–10.3)
lymph#: 0.6 10*3/uL — ABNORMAL LOW (ref 0.9–3.3)

## 2009-06-14 LAB — COMPREHENSIVE METABOLIC PANEL
AST: 15 U/L (ref 0–37)
Albumin: 3.8 g/dL (ref 3.5–5.2)
Alkaline Phosphatase: 56 U/L (ref 39–117)
BUN: 11 mg/dL (ref 6–23)
Potassium: 3.7 mEq/L (ref 3.5–5.3)
Sodium: 140 mEq/L (ref 135–145)
Total Bilirubin: 1 mg/dL (ref 0.3–1.2)
Total Protein: 7.6 g/dL (ref 6.0–8.3)

## 2009-06-14 LAB — MORPHOLOGY

## 2009-06-15 LAB — IGG, IGA, IGM
IgA: 380 mg/dL — ABNORMAL HIGH (ref 68–378)
IgM, Serum: 23 mg/dL — ABNORMAL LOW (ref 60–263)

## 2009-06-15 LAB — KAPPA/LAMBDA LIGHT CHAINS
Kappa:Lambda Ratio: 0.59 (ref 0.26–1.65)
Lambda Free Lght Chn: 1.61 mg/dL (ref 0.57–2.63)

## 2009-08-08 ENCOUNTER — Ambulatory Visit: Payer: Self-pay | Admitting: Oncology

## 2009-08-17 LAB — COMPREHENSIVE METABOLIC PANEL
CO2: 28 mEq/L (ref 19–32)
Creatinine, Ser: 0.8 mg/dL (ref 0.40–1.20)
Glucose, Bld: 104 mg/dL — ABNORMAL HIGH (ref 70–99)
Total Bilirubin: 1 mg/dL (ref 0.3–1.2)

## 2009-08-17 LAB — CBC WITH DIFFERENTIAL/PLATELET
BASO%: 0.1 % (ref 0.0–2.0)
LYMPH%: 15.7 % (ref 14.0–49.7)
MCHC: 32.8 g/dL (ref 31.5–36.0)
MONO#: 0.3 10*3/uL (ref 0.1–0.9)
MONO%: 6.8 % (ref 0.0–14.0)
Platelets: 215 10*3/uL (ref 145–400)
RBC: 3.71 10*6/uL (ref 3.70–5.45)
RDW: 14.4 % (ref 11.2–14.5)
WBC: 4.2 10*3/uL (ref 3.9–10.3)

## 2009-08-17 LAB — MORPHOLOGY: PLT EST: ADEQUATE

## 2009-08-20 LAB — KAPPA/LAMBDA LIGHT CHAINS
Kappa free light chain: 1.33 mg/dL (ref 0.33–1.94)
Kappa:Lambda Ratio: 0.63 (ref 0.26–1.65)
Lambda Free Lght Chn: 2.12 mg/dL (ref 0.57–2.63)

## 2009-08-20 LAB — IGG: IgG (Immunoglobin G), Serum: 1300 mg/dL (ref 694–1618)

## 2009-09-05 ENCOUNTER — Encounter: Payer: Self-pay | Admitting: Internal Medicine

## 2009-09-17 ENCOUNTER — Telehealth: Payer: Self-pay | Admitting: Internal Medicine

## 2009-11-20 ENCOUNTER — Telehealth: Payer: Self-pay | Admitting: Internal Medicine

## 2009-12-05 ENCOUNTER — Ambulatory Visit: Payer: Self-pay | Admitting: Oncology

## 2009-12-07 ENCOUNTER — Encounter: Payer: Self-pay | Admitting: Internal Medicine

## 2009-12-07 ENCOUNTER — Ambulatory Visit (HOSPITAL_COMMUNITY): Admission: RE | Admit: 2009-12-07 | Discharge: 2009-12-07 | Payer: Self-pay | Admitting: Family Medicine

## 2009-12-07 LAB — MORPHOLOGY: PLT EST: ADEQUATE

## 2009-12-07 LAB — CBC WITH DIFFERENTIAL/PLATELET
BASO%: 0.4 % (ref 0.0–2.0)
Basophils Absolute: 0 10*3/uL (ref 0.0–0.1)
EOS%: 0.4 % (ref 0.0–7.0)
HGB: 11.8 g/dL (ref 11.6–15.9)
MCH: 31.7 pg (ref 25.1–34.0)
NEUT#: 2.6 10*3/uL (ref 1.5–6.5)
Platelets: 221 10*3/uL (ref 145–400)
RBC: 3.73 10*6/uL (ref 3.70–5.45)
WBC: 3.4 10*3/uL — ABNORMAL LOW (ref 3.9–10.3)

## 2009-12-11 ENCOUNTER — Encounter: Payer: Self-pay | Admitting: Internal Medicine

## 2009-12-11 LAB — IMMUNOFIXATION ELECTROPHORESIS
IgA: 417 mg/dL — ABNORMAL HIGH (ref 68–378)
IgG (Immunoglobin G), Serum: 1500 mg/dL (ref 694–1618)
Total Protein, Serum Electrophoresis: 7.6 g/dL (ref 6.0–8.3)

## 2009-12-11 LAB — COMPREHENSIVE METABOLIC PANEL
AST: 16 U/L (ref 0–37)
Alkaline Phosphatase: 59 U/L (ref 39–117)
BUN: 11 mg/dL (ref 6–23)
Calcium: 9.9 mg/dL (ref 8.4–10.5)
Creatinine, Ser: 1.03 mg/dL (ref 0.40–1.20)
Glucose, Bld: 105 mg/dL — ABNORMAL HIGH (ref 70–99)

## 2009-12-11 LAB — KAPPA/LAMBDA LIGHT CHAINS: Kappa free light chain: 2.02 mg/dL — ABNORMAL HIGH (ref 0.33–1.94)

## 2009-12-14 ENCOUNTER — Ambulatory Visit: Payer: Self-pay | Admitting: Internal Medicine

## 2009-12-14 DIAGNOSIS — R279 Unspecified lack of coordination: Secondary | ICD-10-CM | POA: Insufficient documentation

## 2009-12-14 DIAGNOSIS — R5383 Other fatigue: Secondary | ICD-10-CM

## 2009-12-14 DIAGNOSIS — R5381 Other malaise: Secondary | ICD-10-CM

## 2010-02-27 ENCOUNTER — Encounter (INDEPENDENT_AMBULATORY_CARE_PROVIDER_SITE_OTHER): Payer: Self-pay | Admitting: *Deleted

## 2010-03-05 ENCOUNTER — Ambulatory Visit: Payer: Self-pay | Admitting: Oncology

## 2010-03-07 ENCOUNTER — Encounter: Payer: Self-pay | Admitting: Internal Medicine

## 2010-03-07 LAB — CBC WITH DIFFERENTIAL/PLATELET
EOS%: 0.4 % (ref 0.0–7.0)
Eosinophils Absolute: 0 10*3/uL (ref 0.0–0.5)
HCT: 34.3 % — ABNORMAL LOW (ref 34.8–46.6)
HGB: 11.5 g/dL — ABNORMAL LOW (ref 11.6–15.9)
MCH: 31.3 pg (ref 25.1–34.0)
MCHC: 33.4 g/dL (ref 31.5–36.0)
MCV: 93.7 fL (ref 79.5–101.0)
MONO#: 0.2 10*3/uL (ref 0.1–0.9)
MONO%: 6.9 % (ref 0.0–14.0)
NEUT#: 2.7 10*3/uL (ref 1.5–6.5)
Platelets: 193 10*3/uL (ref 145–400)
lymph#: 0.4 10*3/uL — ABNORMAL LOW (ref 0.9–3.3)

## 2010-03-07 LAB — COMPREHENSIVE METABOLIC PANEL
Albumin: 3.9 g/dL (ref 3.5–5.2)
Calcium: 9.4 mg/dL (ref 8.4–10.5)
Chloride: 104 mEq/L (ref 96–112)
Creatinine, Ser: 0.76 mg/dL (ref 0.40–1.20)
Sodium: 139 mEq/L (ref 135–145)

## 2010-03-07 LAB — LACTATE DEHYDROGENASE: LDH: 152 U/L (ref 94–250)

## 2010-03-07 LAB — MORPHOLOGY: PLT EST: ADEQUATE

## 2010-03-08 LAB — KAPPA/LAMBDA LIGHT CHAINS
Kappa free light chain: 1.48 mg/dL (ref 0.33–1.94)
Kappa:Lambda Ratio: 0.34 (ref 0.26–1.65)
Lambda Free Lght Chn: 4.32 mg/dL — ABNORMAL HIGH (ref 0.57–2.63)

## 2010-06-19 ENCOUNTER — Ambulatory Visit: Payer: Self-pay | Admitting: Oncology

## 2010-06-28 LAB — COMPREHENSIVE METABOLIC PANEL
ALT: 14 U/L (ref 0–35)
AST: 19 U/L (ref 0–37)
Albumin: 4 g/dL (ref 3.5–5.2)
Alkaline Phosphatase: 53 U/L (ref 39–117)
CO2: 29 mEq/L (ref 19–32)
Calcium: 9.6 mg/dL (ref 8.4–10.5)
Glucose, Bld: 105 mg/dL — ABNORMAL HIGH (ref 70–99)
Potassium: 3.4 mEq/L — ABNORMAL LOW (ref 3.5–5.3)
Total Protein: 8 g/dL (ref 6.0–8.3)

## 2010-06-28 LAB — CBC WITH DIFFERENTIAL/PLATELET
Basophils Absolute: 0 10*3/uL (ref 0.0–0.1)
EOS%: 0.4 % (ref 0.0–7.0)
HCT: 32.8 % — ABNORMAL LOW (ref 34.8–46.6)
LYMPH%: 13.5 % — ABNORMAL LOW (ref 14.0–49.7)
MCH: 30.4 pg (ref 25.1–34.0)
MCHC: 32.6 g/dL (ref 31.5–36.0)
MONO#: 0.3 10*3/uL (ref 0.1–0.9)
MONO%: 6.6 % (ref 0.0–14.0)
Platelets: 225 10*3/uL (ref 145–400)
RBC: 3.53 10*6/uL — ABNORMAL LOW (ref 3.70–5.45)
RDW: 15.3 % — ABNORMAL HIGH (ref 11.2–14.5)
WBC: 4.3 10*3/uL (ref 3.9–10.3)
lymph#: 0.6 10*3/uL — ABNORMAL LOW (ref 0.9–3.3)

## 2010-06-28 LAB — LACTATE DEHYDROGENASE: LDH: 151 U/L (ref 94–250)

## 2010-07-01 LAB — KAPPA/LAMBDA LIGHT CHAINS: Lambda Free Lght Chn: 6.8 mg/dL — ABNORMAL HIGH (ref 0.57–2.63)

## 2010-09-05 ENCOUNTER — Telehealth: Payer: Self-pay | Admitting: Internal Medicine

## 2010-09-10 ENCOUNTER — Telehealth: Payer: Self-pay | Admitting: Internal Medicine

## 2010-09-11 ENCOUNTER — Ambulatory Visit: Payer: Self-pay | Admitting: Oncology

## 2010-09-13 ENCOUNTER — Encounter: Payer: Self-pay | Admitting: Internal Medicine

## 2010-09-13 LAB — CBC WITH DIFFERENTIAL/PLATELET
Basophils Absolute: 0 10*3/uL (ref 0.0–0.1)
Eosinophils Absolute: 0 10*3/uL (ref 0.0–0.5)
HCT: 35.8 % (ref 34.8–46.6)
LYMPH%: 15.3 % (ref 14.0–49.7)
MCV: 91.4 fL (ref 79.5–101.0)
MONO#: 0.3 10*3/uL (ref 0.1–0.9)
MONO%: 5.9 % (ref 0.0–14.0)
NEUT#: 4.3 10*3/uL (ref 1.5–6.5)
NEUT%: 78.3 % — ABNORMAL HIGH (ref 38.4–76.8)
Platelets: 236 10*3/uL (ref 145–400)
RBC: 3.91 10*6/uL (ref 3.70–5.45)

## 2010-09-14 LAB — LACTATE DEHYDROGENASE: LDH: 172 U/L (ref 94–250)

## 2010-09-14 LAB — COMPREHENSIVE METABOLIC PANEL
Alkaline Phosphatase: 55 U/L (ref 39–117)
BUN: 12 mg/dL (ref 6–23)
CO2: 29 mEq/L (ref 19–32)
Creatinine, Ser: 0.85 mg/dL (ref 0.40–1.20)
Glucose, Bld: 90 mg/dL (ref 70–99)
Sodium: 142 mEq/L (ref 135–145)
Total Bilirubin: 1.1 mg/dL (ref 0.3–1.2)

## 2010-09-16 ENCOUNTER — Encounter: Payer: Self-pay | Admitting: Internal Medicine

## 2010-09-16 LAB — KAPPA/LAMBDA LIGHT CHAINS
Kappa free light chain: 2.05 mg/dL — ABNORMAL HIGH (ref 0.33–1.94)
Kappa:Lambda Ratio: 0.26 (ref 0.26–1.65)
Lambda Free Lght Chn: 7.76 mg/dL — ABNORMAL HIGH (ref 0.57–2.63)

## 2010-09-16 LAB — IGG: IgG (Immunoglobin G), Serum: 1750 mg/dL — ABNORMAL HIGH (ref 694–1618)

## 2010-09-26 ENCOUNTER — Encounter: Payer: Self-pay | Admitting: Internal Medicine

## 2010-09-26 LAB — CONVERTED CEMR LAB
Albumin: 4.1 g/dL
BUN: 12 mg/dL
CO2: 29 meq/L
Calcium: 9.9 mg/dL
Creatinine, Ser: 0.85 mg/dL
Potassium: 3.7 meq/L

## 2010-09-29 HISTORY — PX: CHOLECYSTECTOMY: SHX55

## 2010-10-20 ENCOUNTER — Encounter: Payer: Self-pay | Admitting: Internal Medicine

## 2010-10-29 NOTE — Assessment & Plan Note (Signed)
Summary: EST PT OV/MED REFILL/KB   Vital Signs:  Patient profile:   77 year old female Height:      61 inches Weight:      115 pounds BMI:     21.81 Temp:     99.4 degrees F oral Pulse rate:   94 / minute BP sitting:   166 / 92  (left arm)  Vitals Entered By: Tora Perches (December 14, 2009 4:35 PM) CC: F/U Is Patient Diabetic? No   CC:  F/U.  History of Present Illness: F/u HTN, anxiety BP OK at home C/o being tiired  Preventive Screening-Counseling & Management  Alcohol-Tobacco     Smoking Status: never  Current Medications (verified): 1)  Lorazepam 1 Mg  Tabs (Lorazepam) .Marland Kitchen.. 1 Two Times A Day Prn 2)  Norvasc 5 Mg  Tabs (Amlodipine Besylate) .... Once Daily  Allergies: 1)  ! Adult Aspirin Low Strength (Aspirin) 2)  ! Tetracycline Hcl (Tetracycline Hcl) 3)  ! Vistaril (Hydroxyzine Pamoate) 4)  ! * Ivp Dye  Past History:  Past Medical History: Last updated: 03/20/2008 Hypertension MM Dr Cyndie Chime Insomnia Psychotic depression Anxiety  Social History: Last updated: 03/20/2008 Retired Runner, broadcasting/film/video Widow/Widower Never Smoked  Review of Systems  The patient denies weight loss and weight gain.    Physical Exam  General:  NAD Nose:  External nasal examination shows no deformity or inflammation. Nasal mucosa are pink and moist without lesions or exudates. Mouth:  Oral mucosa and oropharynx without lesions or exudates.  Teeth in good repair. Lungs:  Normal respiratory effort, chest expands symmetrically. Lungs are clear to auscultation, no crackles or wheezes. Heart:  Tachy, reg rr Abdomen:  Bowel sounds positive,abdomen soft and non-tender without masses, organomegaly or hernias noted. Msk:  No deformity or scoliosis noted of thoracic or lumbar spine.   Lumbar-sacral spine is stiff,  tender to palpation over paraspinal muscles and painfull with the ROM  Neurologic:  HP is pos on R Skin:  Intact without suspicious lesions or rashes Psych:  moderately  anxious.     Impression & Recommendations:  Problem # 1:  ANXIETY (ICD-300.00) Assessment Unchanged  The following medications were removed from the medication list:    Paroxetine Hcl 10 Mg Tabs (Paroxetine hcl) ..... One by mouth daily Her updated medication list for this problem includes:    Lorazepam 1 Mg Tabs (Lorazepam) .Marland Kitchen... 1 two times a day prn  Problem # 2:  HYPERTENSION (ICD-401.9) Assessment: Improved  Her updated medication list for this problem includes:    Norvasc 5 Mg Tabs (Amlodipine besylate) ..... Once daily  BP today: 166/92 Prior BP: 188/93 (03/20/2008)  Labs Reviewed: K+: 4.1 (06/07/2007) Creat: : 0.7 (06/07/2007)   Chol: 158 (03/09/2007)   HDL: 58.8 (03/09/2007)   LDL: 92 (03/09/2007)   TG: 37 (03/09/2007)  Problem # 3:  ATAXIA (ICD-781.3) Assessment: Comment Only  Problem # 4:  MULTIPLE  MYELOMA (ICD-203.00) Assessment: Unchanged  Problem # 5:  LBP See "Patient Instructions".   Problem # 6:  Fatigue Assessment: Unchanged See "Patient Instructions".   Complete Medication List: 1)  Lorazepam 1 Mg Tabs (Lorazepam) .Marland Kitchen.. 1 two times a day prn 2)  Norvasc 5 Mg Tabs (Amlodipine besylate) .... Once daily 3)  Vitamin D 1000 Unit Tabs (Cholecalciferol) .Marland Kitchen.. 1 by mouth qd 4)  Vaniqa 13.9 % Crea (Eflornithine hcl) .... Use once daily on face  Patient Instructions: 1)  Can try Benicar 20 mg 1-2 by mouth once daily for your BP instead  of Norvasc 2)  Use stretching exercises that I have provided (15 min. or longer every day) 3)  Start taking a chair  yoga class  4)  Please schedule a follow-up appointment in 6 months. Prescriptions: VANIQA 13.9 % CREA (EFLORNITHINE HCL) use once daily on face  #45 g x 0   Entered and Authorized by:   Tresa Garter MD   Signed by:   Tresa Garter MD on 12/14/2009   Method used:   Print then Give to Patient   RxID:   [id number] NORVASC 5 MG  TABS (AMLODIPINE BESYLATE) once daily  #30 Tablet x 12    Entered and Authorized by:   Tresa Garter MD   Signed by:   Tresa Garter MD on 12/14/2009   Method used:   Print then Give to Patient   RxID:   [id number] LORAZEPAM 1 MG  TABS (LORAZEPAM) 1 two times a day prn  #60 x 6   Entered and Authorized by:   Tresa Garter MD   Signed by:   Tresa Garter MD on 12/14/2009   Method used:   Print then Give to Patient   RxID:   [id number]

## 2010-10-29 NOTE — Progress Notes (Signed)
Summary: REFILL  Phone Note Call from Patient   Summary of Call: Patient is requesting refill of Lorazepam. OK? Daughter wants to know if patient needs office visit anytime soon? She is being followed by cancer center on a regular basis.  Initial call taken by: Lamar Sprinkles, CMA,  November 20, 2009 3:54 PM  Follow-up for Phone Call        Yes, she needs to see me for a Rx (OK to fill x1) or she  can ask Cancer Center to issue Lorazepam Rx Follow-up by: Tresa Garter MD,  November 21, 2009 12:58 PM  Additional Follow-up for Phone Call Additional follow up Details #1::        D[facility] notified and [facility]. Additional Follow-up by: Lucious Groves,  November 21, 2009 3:24 PM    Prescriptions: LORAZEPAM 1 MG  TABS (LORAZEPAM) 1 two times a day prn  #60 x 0   Entered by:   Lucious Groves   Authorized by:   Tresa Garter MD   Signed by:   Lucious Groves on 11/21/2009   Method used:   [facility]phoned to ...       [facility]  [address] [phone]* (retail)       [address]       [place], [place]  [address]       Ph: [phone] or [phone]       Fax: [phone]   RxID:   [id number]

## 2010-10-29 NOTE — Letter (Signed)
Summary: [facility]  [facility]   Imported By: Sherian Rein 10/12/2009 10:29:15  _____________________________________________________________________  External Attachment:    Type:   Image     Comment:   External Document

## 2010-10-29 NOTE — Letter (Signed)
Summary: Colonoscopy Date Change Letter  [facility]  [address] [place], [place] [address]   Phone: [phone]  Fax: [phone]      February 27, 2010 MRN: [id number]   [facility] [address] [place], [place]  [address]   Dear Ms. Schoene,   Previously you were recommended to have a repeat colonoscopy around this time. Your chart was recently reviewed by Dr. Lina Sar of [facility]. Follow up colonoscopy is now recommended in June 2016. This revised recommendation is based on current, nationally recognized guidelines for colorectal cancer screening and polyp surveillance. These guidelines are endorsed by the American Cancer Society, [employer] on Colorectal Cancer as well as numerous other major medical organizations.  Please understand that our recommendation assumes that you do not have any new symptoms such as bleeding, a change in bowel habits, anemia, or significant abdominal discomfort. If you do have any concerning GI symptoms or want to discuss the guideline recommendations, please call to arrange an office visit at your earliest convenience. Otherwise we will keep you in our reminder system and contact you 1-2 months prior to the date listed above to schedule your next colonoscopy.  Thank you,  Hedwig Morton. Juanda Chance, M.D.  [facility] [phone]

## 2010-10-29 NOTE — Progress Notes (Signed)
Summary: RF  Phone Note Refill Request Message from:  Patient on September 05, 2010 4:54 PM  Refills Requested: Medication #1:  LORAZEPAM 1 MG  TABS 1 two times a day prn Daughter (Alfa) left msg requesting refill on Lorazepam. Called back no ansew LMOM md out of office will be tomorrow before we get approval. Is this ok to refill?  Initial call taken by: Orlan Leavens RMA,  September 05, 2010 4:55 PM Caller: Daughter Ellie Lunch [phone] Reason for Call: Refill Medication, Talk to Doctor Summary of Call: Left msg on vm needing refill on mother Lorazepam.   Follow-up for Phone Call        ok x 3 Follow-up by: Tresa Garter MD,  September 06, 2010 1:06 PM    Prescriptions: LORAZEPAM 1 MG  TABS (LORAZEPAM) 1 two times a day prn  #60 x 3   Entered by:   Lamar Sprinkles, CMA   Authorized by:   Tresa Garter MD   Signed by:   Lamar Sprinkles, CMA on 09/06/2010   Method used:   Telephoned to ...       [facility] (retail)       [address]       [place], [place]  [address]       Ph: [phone]       Fax: [phone]   RxID:   [id number]

## 2010-10-31 NOTE — Progress Notes (Signed)
Summary: Lorazepam  Phone Note Call from Patient   Caller: [phone] - Dtr Summary of Call: [facility] [address] as last phone note has. Need to correct.  Initial call taken by: Lamar Sprinkles, CMA,  September 10, 2010 4:38 PM  Follow-up for Phone Call        Canc[facility] at [facility] daughter informed Follow-up by: Lamar Sprinkles, CMA,  September 10, 2010 5:51 PM    Prescriptions: LORAZEPAM 1 MG  TABS (LORAZEPAM) 1 two times a day prn  #60 x 3   Entered by:   Lamar Sprinkles, CMA   Authorized by:   Tresa Garter MD   Signed by:   Lamar Sprinkles, CMA on 09/10/2010   Method used:   Telephoned to ...       [facility]  [address] [phone]* (retail)       [address]       [place], [place]  [id number]       Ph: [phone] or [phone]       Fax: [phone]   RxID:   [id number]

## 2010-10-31 NOTE — Letter (Signed)
Summary: [facility]  [facility]   Imported By: Lennie Odor 09/27/2010 14:15:24  _____________________________________________________________________  External Attachment:    Type:   Image     Comment:   External Document

## 2010-10-31 NOTE — Miscellaneous (Signed)
Summary: lab results  Clinical Lists Changes  Observations: Added new observation of ALBUMIN: 4.1 g/dL (04/54/0981 19:14) Added new observation of PROTEIN, TOT: 8.3 g/dL (78/29/5621 30:86) Added new observation of CALCIUM: 9.9 mg/dL (57/84/6962 95:28) Added new observation of ALK PHOS: 55 units/L (09/26/2010 16:18) Added new observation of BILI TOTAL: 1.1 mg/dL (41/32/4401 02:72) Added new observation of SGPT (ALT): 18 units/L (09/26/2010 16:18) Added new observation of SGOT (AST): 22 units/L (09/26/2010 16:18) Added new observation of CO2 PLSM/SER: 29 meq/L (09/26/2010 16:18) Added new observation of CL SERUM: 104 meq/L (09/26/2010 16:18) Added new observation of K SERUM: 3.7 meq/L (09/26/2010 16:18) Added new observation of NA: 142 meq/L (09/26/2010 16:18) Added new observation of CREATININE: 0.85 mg/dL (53/66/4403 47:42) Added new observation of BUN: 12 mg/dL (59/56/3875 64:33)      -  Date:  09/26/2010    BUN: 12    Creatinine: 0.85    Sodium: 142    Potassium: 3.7    Chloride: 104    CO2 Total: 29    SGOT (AST): 22    SGPT (ALT): 18    T. Bilirubin: 1.1    Alk Phos: 55    Calcium: 9.9    Total Protein: 8.3    Albumin: 4.1

## 2011-01-28 HISTORY — PX: SIGMOID RESECTION / RECTOPEXY: SUR1294

## 2011-02-05 ENCOUNTER — Telehealth: Payer: Self-pay | Admitting: *Deleted

## 2011-02-05 NOTE — Telephone Encounter (Signed)
Daughter left VM requesting RF of Ativan 1 mg bid prn (unsure pharm)

## 2011-02-06 MED ORDER — LORAZEPAM 1 MG PO TABS: 1.0000 mg | ORAL_TABLET | Freq: Two times a day (BID) | ORAL | Status: DC | PRN

## 2011-02-06 NOTE — Telephone Encounter (Signed)
OK to fill this prescription with additional refills x1. Needs OV Thank you!  

## 2011-02-06 NOTE — Telephone Encounter (Signed)
Attempted to call to confirm pharm, no answer no vm. Called into last pharm we called [facility] [address]

## 2011-02-11 NOTE — Assessment & Plan Note (Signed)
[facility]                           PRIMARY CARE OFFICE NOTE   HAIDE, Victoria [name]                       MRN:          [id number]  DATE:03/08/2007                            DOB:          [birth year]    The patient is a 77 year old female new patient who presents with her  daughter with complaint of ringing in the ears, anxiety and  constipation. The ringing in the ears has been chronic. She saw Dr.  Haroldine Laws who gave her a hearing aid in the past.   PAST MEDICAL HISTORY:  1. Elevated blood pressure with white coat hypertension.  2. Anxiety.  3. Tinnitus.  4. Post nasal drip.  5. History of diverticulitis with bleeding in 2006 status post      colonoscopy in 2006.  6. Anxiety.  7. History of elevated glucose.   FAMILY HISTORY:  Positive for diabetes in her sister.   SOCIAL HISTORY:  She lives with her daughter. She has 2 children. She  does not smoke or drink. She is a retired Runner, broadcasting/film/video.   CURRENT MEDICATIONS:  None.   REVIEW OF SYSTEMS:  Blood pressure over 200 when she is at the doctor's  office. Extremely anxious around doctors or when gets tests. Tinnitus is  bothersome. No headache, denies chest pain, weight loss, no memory  problems. The rest of the 18-point review of systems is otherwise  negative.   PHYSICAL EXAMINATION:  VITAL SIGNS:  Blood pressure 228/98, pulse 96,  temperature 99, weight 121 pounds.  GENERAL:  She is in no acute distress.  HEENT:  Moist mucosa. Clear discharge in both nostrils. Left ear blocked  with wax.  NECK:  Supple, possible bruit on both sides.  HEART:  S1, S2. Grade 2/6 systolic murmur.  ABDOMEN:  Soft, slightly distended. There is a U shaped firm large mass  in the left side of the stomach and to the lower center. Nontender.  RECTAL:  Not done.  EXTREMITIES:  Lower extremities without edema.  NEUROLOGIC:  She is alert, oriented and cooperative.   ASSESSMENT/PLAN:  1. Elevated blood pressure seems  more to be related to white coat      hypertension. There is probably some baseline hypertension as well.      Started Inderal 20 mg twice daily. Hopefully this will help with      anxiety as well.  2. Chronic tinnitus. Secondary to #1. Vistaril 50 mg 1/2 - 1 b.i.d.      p.r.n. Followup with Dr. Haroldine Laws.  3. Diverticulitis in 2006 with a colonoscopy 2006. She has been      constipated. MiraLax 17 grams daily.  4. Mass in the left abdomen, unclear etiology. Iam ordering a CT scan      of the abdomen and pelvis with contrast.  5. Wax impaction left ear. She will try to disimpact at home with a      wax removing kit.  6. Weight loss. Plan as above. Check TSH.  7. History of elevated glucose. Check A1c.  8. Chronic rhinitis. Flonase 1 spray each nostril daily.  Georgina Quint. Plotnikov, MD  Electronically Signed    AVP/MedQ  DD: 03/08/2007  DT: 03/08/2007  Job #: [id number]

## 2011-02-11 NOTE — Discharge Summary (Signed)
Victoria [name], Victoria [name]                ACCOUNT NO.:  [id number]   MEDICAL RECORD NO.:  [id number]          PATIENT TYPE:  INP   LOCATION:  1607                         FACILITY:  [facility]   PHYSICIAN:  Rosalyn Gess. Norins, MD  DATE OF BIRTH:  08/02/1932   DATE OF ADMISSION:  06/22/2007  DATE OF DISCHARGE:  06/24/2007                               DISCHARGE SUMMARY   ADMISSION DIAGNOSES:  1. Psychotic break with paranoia and agitation  2. Weight loss.  3. Hypertension.  4. Tinnitus.   DISCHARGE DIAGNOSES:  1. Psychotic break with paranoia and agitation  2. Weight loss.  3. Hypertension.  4. Tinnitus.   PROCEDURES:  No inpatient procedures were done.  The patient had  outpatient CT of brain June 19, 2007, which was negative.   CONSULTANTS:  Dr. Jeanie Sewer for Psychiatry.   HISTORY OF PRESENT ILLNESS:  The patient is a 77 year old African  American woman with no previous significant psychiatric history who  began having paranoid thoughts centered on the problems with family.  She had repetitive thoughts, insomnia and agitation.  The patient had  been seen in the emergency department x2.  She had been advised to see a  psychiatrist, was given Symbyax 6/25 as a sample.  The patient, on the  day of admission, presented with increasing paranoid thoughts and  agitation.  Please see the H&P for past medical history, family history,  social history and medications.   HOSPITAL COURSE:  The patient was initially very agitated and combative  and did require restraints overnight.  Since that time, she has been  more calm.  She has been resting well.  She was seen by Dr. Jeanie Sewer  who felt the patient should continue on Risperdal 0.5 mg q.a.m. and 1 mg  q.h.s..   The patient did remain calm and stable.  On the morning of discharge,  she was resting quietly.  Her daughter is present and has been staying  with her and reports that her mother had a quiet night and was doing  well.   FINAL  LABORATORY:  The patient had a CBC on September 23 which revealed  hemoglobin 11.7 grams, white count was 5200.  Chemistries from September  23 were normal with a BUN 22, creatinine 0.99, glucose was 97, liver  functions on September 23 were normal except for a total bilirubin  mildly elevated at 1.3.  Electrolytes were normal.  Urinalysis was  negative.  Cardiac markers were negative.   DISCHARGE PHYSICAL EXAMINATION:  The patient is a sleep was not awakened  as she did not have a quiet night.  Temperature was 98.8, blood pressure  123/75, pulse 68.   DISPOSITION:  The patient is discharged home.  Her daughter does provide  her ongoing care.  Dr. Jeanie Sewer has suggested possibility of intensive  outpatient therapy.  Per the daughter, the patient has an appointment  see Dr. Betti Cruz for Psychiatry on October 2.   DISCHARGE MEDICATIONS:  1. HCTZ 12.5 mg daily.  2. Risperdal 0.5 mg q.a.m. 1 mg q.h.s.  3. Tylenol as needed.  4. Lorazepam 1  mg one p.o. q.6 h p.r.n. severe agitation and q.h.s.      for sleep..   CONDITION AT TIME OF DISCHARGE:  Stable.   FOLLOWUP:  She is to keep her follow-up appoint with Dr. Betti Cruz on  October 2.      Rosalyn Gess Norins, MD  Electronically Signed     MEN/MEDQ  D:  06/24/2007  T:  06/24/2007  Job:  [id number]   cc:   Georgina Quint. Plotnikov, MD  [address]  [place]  [place] [address]   Daine Floras, M.D.  Fax: [phone]

## 2011-02-11 NOTE — H&P (Signed)
[name], [name]                ACCOUNT NO.:  [id number]   MEDICAL RECORD NO.:  [id number]          PATIENT TYPE:  INP   LOCATION:  1607                         FACILITY[facility]  [facility]   PHYSICIAN:  Georgina Quint. Plotnikov, MDDATE OF BIRTH:  December 03, 1931   DATE OF ADMISSION:  06/22/2007  DATE OF DISCHARGE:                              HISTORY & PHYSICAL   CHIEF COMPLAINT:  None.   HISTORY OF PRESENT ILLNESS:  The patient is a 77 year old female with no  significant previous psychiatric history who started to have paranoid  thoughts mostly about the fact that she destroyed her family; repetitive  thoughts, insomnia, agitation.  Her daughters took her to the emergency  room twice, the first time on Saturday, then on Monday.  The patient was  advised to see a psychiatrist and was given Cymbia 6/25 (sample).  Her  daughter states that in the emergency room she had a head scan which was  normal.  Chest x-ray and blood work with urine test unremarkable.  She  has been complaining of night terrors as well.   PAST MEDICAL HISTORY:  1. Anxiety.  2. White coat hypertension.  3. Tinnitus.  4. History of diverticulitis.  5. History of elevated glucose.   FAMILY HISTORY:  Positive for diabetes in her sister.   SOCIAL HISTORY:  She lives with her daughter, two children.  Does not  smoke or drink.  She is a retired Runner, broadcasting/film/video.   CURRENT MEDICATIONS:  1. Cymbia 6/25 for 2 days I understand.  2. HCTZ 12.5 mg daily.  3. Flonase nasal p.r.n.  4. Xanax 0.25 mg 1/2-1 b.i.d. p.r.n.   ALLERGIES:  VISTARIL MADE HER FEEL WEIRD.  IV CONTRAST CAUSED LIP  SWELLING.  ASPIRIN CAUSED TACHYCARDIA.   REVIEW OF SYSTEMS:  Unobtainable from the patient.   PHYSICAL EXAMINATION:  Blood pressure 180/91, pulse 100, temperature  97.4, weight 108 pounds.  She is in no acute distress.  Tense, alert,  oriented, cooperative.  Denies being depressed or paranoid.  HEENT:  Moist mucosa.  NECK:  Supple.  LUNGS:  Clear.  HEART:  Regular, tachycardiac.  ABDOMEN:  Soft, nontender.  LOWER EXTREMITIES: Without edema.  Calves nontender.  She is alert and  cooperative.  SKIN:  Clear.  No rashes.  No meningeal signs.  Muscle strength within  normal limits.   LABORATORY DATA:  None available at present.   ASSESSMENT/PLAN:  1. Paranoia/agitation.  Likely the patient has decompensated bipolar      disorder.  She has not been sleeping or eating for a couple days.      The family is very concerned.  We will admit her for inpatient evaluation and treatment.  Psychiatry  consultation with Dr. Jeanie Sewer.  I will discontinue Cymbia since it has not helped yet  and start on  Risperdal 1 mg b.i.d. We will use Xanax p.r.n.  1. Recently suspected multiple myeloma with elevated sed rate,      abnormal SPEP and a lytic lesion on the pelvic bone.  She will need      a Hem/Onc consult as an outpatient.  2. Hypertension with white coat component.  She was on HCTZ.  We will      restart.  3. Tinnitus, chronic.      Georgina Quint. Plotnikov, MD  Electronically Signed     AVP/MEDQ  D:  06/22/2007  T:  06/23/2007  Job:  [id number]

## 2011-02-11 NOTE — Consult Note (Signed)
[name], [name]                ACCOUNT NO.:  [id number]   MEDICAL RECORD NO.:  [id number]          PATIENT TYPE:  INP   LOCATION:  1607                         FACILITY:  [facility]   PHYSICIAN:  Antonietta Breach, M.D.  DATE OF BIRTH:  03/19/1932   DATE OF CONSULTATION:  06/23/2007  DATE OF DISCHARGE:                                 CONSULTATION   REFERRING PHYSICIAN:  Georgina Quint. Plotnikov, MD   REASON FOR CONSULTATION:  Catastrophic anxiety and psychosis.   HISTORY OF PRESENT ILLNESS:  [name] is a 77 year old female  ad[facility] on September 23 due to severe  anxiety and psychosis.   [name] has experienced approximately 10 days of progressive feeling  on edge, excessive worry, muscle tension and insomnia.  She began  developing paranoid delusions yesterday.  She was stating that she was  destroying her family.  She also had some psychotic religious  projections involving demons in her room.   The patient was given Symbyax 6-25 in an emergency room on September 20.  It is unclear whether she took any of this.   The patient has maintained her ability for orientation and memory.  She  has not had any thoughts of harming herself or others.  She has  maintained her normal interest in activities and her children through  this time.  While she is suspicious, she is not combative.  She has been  accepting care in her room including medications.  Her daughter and son  are at the bedside.   PAST PSYCHIATRIC HISTORY:  The daughter helped with the history.  The  patient has no history of psychotropic medications up until  approximately 10 years ago when she did receive Xanax from her  outpatient primary care physician for some feeling on edge, muscle  tension and some excessive worry.   She did not require psychotropic intervention since then, until this  past week.   The patient and the daughter due identified a history of some excessive  worry  and feeling on edge, muscle tension that apparently has increased  since the patient stopped teaching.   FAMILY PSYCHIATRIC HISTORY:  None known.   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  One of her favorite  topics was teaching [place] and literature.  She has been living with  her daughter.  She has two children, female and female.  Her daughter  lives in the area.  The patient's religion is Methodist.  She does not  drink alcohol or use illegal drugs.   PAST MEDICAL HISTORY:  It is noted that the patient has recently had an  elevated sed rate assessed in early September.  She also has had an  abnormal S-pep.  There was a lytic lesion located on her pelvis.  She is  pending a Hematology/Oncology workup and there is a provisional  diagnosis of multiple myeloma.  It must be emphasized that this is  provisional at this point.   The patient  also has hypertension.  She has a history of tinnitus,  diverticulitis.  She also has a history of an elevated glucose.   MEDICATIONS:  The MAR is reviewed.   ALLERGIES:  The patient has allergies to ASPIRIN and LATEX.   On medication review she is currently on Xanax 0.25 mg b.i.d. Risperdal  1 mg b.i.d. has been ordered.  She is also on Ativan 0.5 mg q.4 h p.r.n.   Her head CT without contrast showed no acute abnormalities.  There was  cortical volume loss and chronic small vessel disease changes.  Of note  her B12 level in the early September was more than adequate.   WBC 5.2, hemoglobin 11.7, platelet count 282.  Urine drug screen was  positive for benzodiazepines only.   Sodium 139, potassium 3.9, chloride 103, bicarb 24, BUN 22, creatinine  0.99, glucose 97.  SGOT 23, SGPT 16, albumin 3.6, calcium 9.7.   REVIEW OF SYSTEMS:  CONSTITUTIONAL:  Afebrile.  No weight loss.  HEAD:  No trauma.  EYES:  No visual changes.  EARS:  No hearing impairment.  NOSE:  No  rhinorrhea.  MOUTH AND THROAT:  No sore throat NEUROLOGIC:  No  focal motor or sensory deficits.  PSYCHIATRIC:  As above.  CARDIOVASCULAR:  No chest pain, palpitations.  RESPIRATORY:  No coughing  or wheezing.  GASTROINTESTINAL:  No nausea, vomiting, diarrhea.  GENITOURINARY:  No dysuria.  SKIN:  Unremarkable.  ENDOCRINE AND  METABOLIC:  No heat or cold intolerance.  MUSCULOSKELETAL:  No  deformities.  HEMATOLOGIC/LYMPHATIC: As above.   EXAMINATION:  VITAL SIGNS:  Temperature 97.8, pulse 60, respiratory rate  18, blood pressure 140/74, O2 saturation on room air 96%.  GENERAL APPEARANCE:  [name] is an elderly female partially reclined  in a supine position in her hospital bed.  She does not have any  abnormal involuntary movements.  She is well-groomed.   OTHER MENTAL STATUS EXAM:  [name] has intact eye contact.  Her  attention span is slightly decreased.  Her concentration is mildly  decreased.  On orientation testing she is intact to all spheres.  Her  memory is intact to immediate, recent and remote.  Her affect is  slightly anxious and initially guarded but improves as the interview  proceeds.  Her mood is slightly anxious.  Her fund of knowledge and  intelligence are estimated to be within normal limits.  Speech is  slightly soft and with a slightly flat prosody, however there is no  dysarthria.  Thought process is logical, coherent, goal-directed.  No  looseness of associations.  The patient does have intact abstraction.  Thought content:  The patient does still have some suspiciousness but  again this does respond to ego support at this time.  She is having no  thoughts of harming herself or others.  She is not having any  hallucinations or ideas of reference.   Her insight is partial at this time.  She is able to identify that she  has had experiences while she has been awake in recent days that are  similar to a nightmare.  At the time of the exam and the ego  supportive  therapy the patient is able to recognize that she is having a mental  condition.  Her judgment is still impaired, however.   Her judgment does appear to be improving significantly over the  description in the medical record last night.   ASSESSMENT:  AXIS I:  293.81 psychotic disorder  not otherwise specified.  293.84 anxiety disorder not otherwise specified, rule out generalized  anxiety disorder.  AXIS II:  None.  AXIS III:  See past medical history and laboratory section above.  AXIS IV:  General medical.  AXIS V:  45.   [name] and her daughter agree to call emergency services for any  return of severe agitation, catastrophic anxiety or hallucinations.   The undersigned provided ego supportive psychotherapy and education.   The indications, alternatives and adverse effects of the following were  discussed with the patient and her daughter:  Risperdal versus Zyprexa  versus Seroquel for anti psychosis and anti agitation including the risk  of a nonreversible movement, sedation, falls, rigidity, hyperglycemia  and Risperdal as stroke warning; Ativan or Xanax for anti acute anxiety  including cognitive and memory impairment, risk of dependence of falls;  Celexa for long-term anti depression and anti anxiety.   The daughter understands and the patient understands the essentials of  above.  They want to proceed as follows.   RECOMMENDATIONS:  1. Continue the Risperdal with anticipation that it will be tried off      in a taper fashion with the taper to be complete after 3 weeks if      there is no return of psychosis.  2. Utilize Ativan in the hospital 0.5 mg q. 4 hours p.r.n. and 1 mg q.      h.s. p.r.n. insomnia.  Discontinue the Xanax for now to avoid      complicating active metabolites.  3. Consider starting Celexa at 10 mg q. a.m. if excessive worry and      other anxiety symptoms continue after this acute episode.  4. Given the patient's memory ability and  her long-term history of      significant anxiety symptoms she would be a candidate for cognitive      behavioral therapy, deep breathing and progressive muscle      relaxation training as an outpatient.  5. Preliminary discharge planning:  Would ask the case manager to set      this patient up with one of the intensive outpatient psychiatric      programs within the community.  Options are [facility],      [place] or [facility].  If the patient continues with her      current level of stability, her daughter reports that she could      provide 24/7 supervision at home during the patient's initial week      at home.   Therefore if the patient remains able to cooperate with care and  continues to be without significant paranoia or other psychotic  symptoms, the patient may be able to leave the hospital tomorrow with  psychiatric follow-up as mentioned above.   Eventually the patient can transition to standard outpatient psychiatric  medication management along with the psychotherapy described.  The long-  term goal will be to eliminate the use of benzodiazepine with the  potential long-term therapy involving only SSRI treatment along with the  psychotherapy mentioned.      Antonietta Breach, M.D.  Electronically Signed     JW/MEDQ  D:  06/23/2007  T:  06/24/2007  Job:  [id number]

## 2011-02-14 NOTE — Discharge Summary (Signed)
NAMEJAELEIGH, Victoria [name]                ACCOUNT NO.:  [id number]   MEDICAL RECORD NO.:  [id number]          PATIENT TYPE:  INP   LOCATION:  1614                         FACILITY:  [facility]   PHYSICIAN:  Wilhemina Bonito. Marina Goodell, M.D. [facility] OF BIRTH:  01/17/1938   DATE OF ADMISSION:  03/27/2005  DATE OF DISCHARGE:  03/30/2005                                 DISCHARGE SUMMARY   ADMITTING DIAGNOSES.:  33.  A 77 year old female with acute GI bleed likely diverticular.  2.  Anemia secondary to above.  3.  Hyperglycemia rule out adult onset diabetes mellitus.  4.  Hypertension.  5.  Anxiety  6.  Status post partial hysterectomy and a C-section x2.   DISCHARGE DIAGNOSES:  1.  Stable status post acute diverticular bleed.  2.  Anemia secondary to above requiring transfusion  3.  Mild hyperglycemia with normal hemoglobin A1c  4.  Hypertension.  5.  Anxiety  6.  Status post partial hysterectomy and a C-section x2.   CONSULTATIONS:  None.   PROCEDURES:  Colonoscopy per Dr. Lina Sar.   BRIEF HISTORY:  Victoria [name] is a 77 year old African-American female known to Dr.  Victorino Dike and Dr. Cato Mulligan, I believe, with a history of diverticulosis. She  had undergone colonoscopy last in 2002 noted to have pandiverticular  disease. At about 2:30 in the morning on the day of admission, she developed  what sounded like a combination of melenic and bloody appearing stool which  then progressed to bright red blood per rectum. She had had at least four  episodes of bleeding prior to presenting to the emergency room. She denied  any associated abdominal pain, nausea, vomiting, diaphoresis, chest pain,  shortness of breath. She has no upper GI complaints at the time of  admission. Denied any NSAID use or aspirin. She was quite anxious about  having to come to the hospital and had to be talked into it over the phone  per triage, nevertheless, she presented hemodynamically stable but  hemoglobin of 8.2 and she was admitted to  the hospital for medical  management of what appears to be a diverticular bleed.   LABORATORY DATA:  On June 29,  WBC of 9.9, hemoglobin 8.2, hematocrit of  24.9, MCV of 82, platelets 304. Hemoglobin remained 8.2, hematocrit of 24.8.  She was given two more units of packed cells with hemoglobin up to 10.2,  hematocrit of 30.4 and then on July 2, hemoglobin was 8.9, hematocrit of  26.2. Pro time 15, INR of 1.2. Electrolytes within normal limits. Glucose  197 on presentation which was not fasting. Glucose fasting on July 1 was 82.  Hemoglobin A1c was 5.8 within the normal range.   HOSPITAL COURSE:  The patient was admitted to the service of Dr. Stan Head who was covering on-call. She was at kept on a of clear liquid diet,  placed at bedrest, started on IV fluids and was transfused 2 units of packed  RBCs on the evening of admission. The following morning she underwent a  bowel prep, did not appear to have any further active hemorrhage with  the  prep and at colonoscopy per Dr. Juanda Chance was noted to have pandiverticulosis  and some narrowing of the sigmoid colon due to diverticulosis. It was felt  she may have had a right colon bleed as there were blood clots and old blood  noted in the right and the left colon. She required two more units of blood  on June 30 and did not have any further of bleeding. Her diet was gradually  advanced. On July 1, she had had dark stools but no gross blood. Her diet  was advanced, serial H&H's is were obtained. She did not have any further  stools and on the morning of July 2 was up walking, feeling fine, hemoglobin  had drifted a bit to 8.9 but again she had had no evidence of active  bleeding for approximately 36 hours and was allowed discharge to home with  instructions to take it easy at home, limit her activity.   DIET:  Low residue for the next week or so. She was to avoid all aspirin and  aspirin products and antiinflammatories and to follow up with  Dr. Victorino Dike in approximately 10 days at which time she will need repeat  hemoglobin   CONDITION ON DISCHARGE:  Stable.   The patient was also advised to obtain a follow-up appointment with Dr.  Cato Mulligan for follow-up of her hypertension and may need to have follow-up labs  regarding her hyperglycemia as well.      Victoria [name]   AE/MEDQ  D:  04/02/2005  T:  04/02/2005  Job:  [id number]   cc:   Ulyess Mort, M.D. [facility]   Bruce H. Swords, M.D. [facility]

## 2011-02-14 NOTE — H&P (Signed)
Victoria [name], Victoria [name]                ACCOUNT NO.:  [id number]   MEDICAL RECORD NO.:  [id number]          PATIENT TYPE:  INP   LOCATION:  0103                         [facility]AC[facility]:  [facility]   PHYSICIAN:  Iva Boop, M.D. [facility] OF BIRTH:  01/17/1938   DATE OF ADMISSION:  03/27/2005  DATE OF DISCHARGE:                                HISTORY & PHYSICAL   CHIEF COMPLAINT:  Bleeding from rectum.   HISTORY:  This is a 77 year old black female with a history of  diverticulosis and recurrent bleeding.  Her last colonoscopy was in 2002,  with pan diverticulosis, Dr. Victorino Dike has followed her.  At about 2:30  this morning she started passing what sounds like melenic and bloody stools  which progressed to bright red blood.  She has had at least four bowel  movements, the last of which was here just a little while ago.  Denies any  abdominal pain, no nausea or vomiting, no weight loss or anorexia.  No  dysphagia or heartburn symptoms.  Denies NSAIDS.  She does not feel unwell  otherwise at this point, though she is quite anxious and nervous about  having to come to the hospital.  This is similar to previous bleeding, and  she says she thinks it is about to stop.  Her hemoglobin is 8.2.   MEDICATIONS:  Multivitamin.  Has been on atenolol in the past, but says that  her blood pressure has been under good control and she has not needed  medication for that anymore.   ALLERGIES:  ASPIRIN.   PAST MEDICAL HISTORY:  1.  Diverticulosis, as above.  2.  Hypertension.  3.  Partial hysterectomy.  4.  Cesarean section x2.   SOCIAL HISTORY:  She is a divorced retired Runner, broadcasting/film/video.  Her daughter comes and  goes in her house.  No tobacco or alcohol.  She has a Development worker, international aid.   REVIEW OF SYSTEMS:  Positive for sinus drainage.  She denies other  complaints at this time.  All other systems are negative.  See above for  other details, however.   PHYSICAL EXAMINATION:  GENERAL:  A pleasant, elderly, black female in  no  acute distress.  VITAL SIGNS:  Blood pressure 197/93, pulse 122 initially, respirations 18,  temperature 99.2.  Recheck blood pressure 181/96, pulse 113.  HEENT:  The eyes are anicteric.  Mouth free of lesions.  LUNGS:  Clear.  NECK:  Supple, no mass.  HEART:  S1 and S2, tachycardic with a 1/6 systolic murmur throughout.  ABDOMEN:  Soft, nontender, bowel sounds present.  There is no organomegaly  or mass.  RECTAL:  In the presence of female nursing staff shows a scanty amount of  bright red blood.  EXTREMITIES:  No edema in the lower extremities.  NEUROLOGIC:  She is alert and oriented x3.  LYMPH NODES:  No neck or supraclavicular nodes palpated.  PSYCHIATRIC:  She is somewhat anxious.  SKIN:  Warm, dry, no rash.   LABORATORY DATA:  Hemoglobin 8.2, MCV 82, hematocrit 24.9, white count 9.9,  platelets 304.  PT and PTT  normal.  Calcium 9.1.  BMET shows sodium 139,  potassium 3.8, chloride 107, CO2 of 25, BUN 17, creatinine 0.9, glucose 197,  calcium 9.1.   ASSESSMENT:  1.  Gastrointestinal bleeding, likely diverticular.  2.  Blood loss anemia.  3.  Hyperglycemia.  This lady probably has diabetes with a glucose of 197.  4.  Hypertension, not controlled at this point, question related to anxiety      or other issues.  5.  Anxiety.   PLAN:  1.  Admit and hydrate.  I think she needs a transfusion given her hemoglobin      of 8.2 and active bleeding.  Risks have been discussed with the patient.      We will transfuse 2 units of packed red blood cells.  2.  We will probably need a colonoscopy to start, may need an      esophagogastroduodenoscopy.  We will start her prep in the early      morning.  3.  Treat anxiety with Ativan.  4.  Check hemoglobin A1C and add sliding scale insulin.  5.  She has had recurrent bleeding, and I think she ought to consider      surgery for recurrent diverticular bleeding, it is not mandatory, but it      is certainly something to consider it sounds  like.  Would need to flush      out the entire past history, however.  May need to discuss that with Dr.      Corinda Gubler.  6.  If her blood pressure remains significantly elevated, we will use      enalaprilat 0.625 mg IV q6h., though we will watch things for now given      her gastrointestinal bleeding and risk for hypotension.       CEG/MEDQ  D:  03/27/2005  T:  03/27/2005  Job:  [id number]   cc:   Ulyess Mort, M.D. [facility]   Bruce H. Swords, M.D. [facility]

## 2011-02-21 ENCOUNTER — Emergency Department (HOSPITAL_COMMUNITY): Payer: Medicare Other

## 2011-02-21 ENCOUNTER — Inpatient Hospital Stay (HOSPITAL_COMMUNITY)
Admission: EM | Admit: 2011-02-21 | Discharge: 2011-03-05 | DRG: 329 | Disposition: A | Discharge status: Home Health | Payer: Medicare Other | Attending: General Surgery | Admitting: General Surgery

## 2011-02-21 DIAGNOSIS — D62 Acute posthemorrhagic anemia: Secondary | ICD-10-CM | POA: Diagnosis not present

## 2011-02-21 DIAGNOSIS — C9000 Multiple myeloma not having achieved remission: Secondary | ICD-10-CM | POA: Diagnosis present

## 2011-02-21 DIAGNOSIS — R259 Unspecified abnormal involuntary movements: Secondary | ICD-10-CM | POA: Diagnosis present

## 2011-02-21 DIAGNOSIS — F411 Generalized anxiety disorder: Secondary | ICD-10-CM | POA: Diagnosis present

## 2011-02-21 DIAGNOSIS — I959 Hypotension, unspecified: Secondary | ICD-10-CM | POA: Diagnosis not present

## 2011-02-21 DIAGNOSIS — K5669 Other intestinal obstruction: Secondary | ICD-10-CM | POA: Diagnosis present

## 2011-02-21 DIAGNOSIS — K5732 Diverticulitis of large intestine without perforation or abscess without bleeding: Secondary | ICD-10-CM | POA: Diagnosis present

## 2011-02-21 DIAGNOSIS — J9600 Acute respiratory failure, unspecified whether with hypoxia or hypercapnia: Secondary | ICD-10-CM | POA: Diagnosis not present

## 2011-02-21 DIAGNOSIS — I471 Supraventricular tachycardia, unspecified: Secondary | ICD-10-CM | POA: Diagnosis present

## 2011-02-21 DIAGNOSIS — D509 Iron deficiency anemia, unspecified: Secondary | ICD-10-CM | POA: Diagnosis present

## 2011-02-21 DIAGNOSIS — F22 Delusional disorders: Secondary | ICD-10-CM | POA: Diagnosis present

## 2011-02-21 DIAGNOSIS — K560 Paralytic ileus: Secondary | ICD-10-CM | POA: Diagnosis not present

## 2011-02-21 DIAGNOSIS — R188 Other ascites: Secondary | ICD-10-CM | POA: Diagnosis present

## 2011-02-21 DIAGNOSIS — F29 Unspecified psychosis not due to a substance or known physiological condition: Secondary | ICD-10-CM | POA: Diagnosis not present

## 2011-02-21 DIAGNOSIS — I369 Nonrheumatic tricuspid valve disorder, unspecified: Secondary | ICD-10-CM

## 2011-02-21 DIAGNOSIS — E876 Hypokalemia: Secondary | ICD-10-CM | POA: Diagnosis not present

## 2011-02-21 DIAGNOSIS — Z79899 Other long term (current) drug therapy: Secondary | ICD-10-CM

## 2011-02-21 DIAGNOSIS — I10 Essential (primary) hypertension: Secondary | ICD-10-CM | POA: Diagnosis present

## 2011-02-21 DIAGNOSIS — K650 Generalized (acute) peritonitis: Principal | ICD-10-CM | POA: Diagnosis present

## 2011-02-21 DIAGNOSIS — Z781 Physical restraint status: Secondary | ICD-10-CM | POA: Diagnosis not present

## 2011-02-21 DIAGNOSIS — R6510 Systemic inflammatory response syndrome (SIRS) of non-infectious origin without acute organ dysfunction: Secondary | ICD-10-CM | POA: Diagnosis not present

## 2011-02-21 LAB — CBC
MCH: 29.6 pg (ref 26.0–34.0)
MCV: 84.9 fL (ref 78.0–100.0)
Platelets: 229 10*3/uL (ref 150–400)
RBC: 3.98 MIL/uL (ref 3.87–5.11)
RDW: 16.7 % — ABNORMAL HIGH (ref 11.5–15.5)
WBC: 2.9 10*3/uL — ABNORMAL LOW (ref 4.0–10.5)

## 2011-02-21 LAB — URINE MICROSCOPIC-ADD ON

## 2011-02-21 LAB — CARDIAC PANEL(CRET KIN+CKTOT+MB+TROPI)
CK, MB: 2.6 ng/mL (ref 0.3–4.0)
CK, MB: 2.6 ng/mL (ref 0.3–4.0)
Relative Index: 1.9 (ref 0.0–2.5)
Relative Index: 1.5 (ref 0.0–2.5)
Total CK: 117 U/L (ref 7–177)

## 2011-02-21 LAB — RAPID URINE DRUG SCREEN, HOSP PERFORMED
Benzodiazepines: NOT DETECTED
Tetrahydrocannabinol: NOT DETECTED

## 2011-02-21 LAB — APTT: aPTT: 30 seconds (ref 24–37)

## 2011-02-21 LAB — DIFFERENTIAL
Basophils Absolute: 0 10*3/uL (ref 0.0–0.1)
Eosinophils Absolute: 0 10*3/uL (ref 0.0–0.7)
Lymphocytes Relative: 4 % — ABNORMAL LOW (ref 12–46)
Monocytes Relative: 9 % (ref 3–12)
Neutro Abs: 2.5 10*3/uL (ref 1.7–7.7)

## 2011-02-21 LAB — BASIC METABOLIC PANEL
CO2: 24 mEq/L (ref 19–32)
Chloride: 99 mEq/L (ref 96–112)
Glucose, Bld: 145 mg/dL — ABNORMAL HIGH (ref 70–99)
Potassium: 3.6 mEq/L (ref 3.5–5.1)
Sodium: 136 mEq/L (ref 135–145)

## 2011-02-21 LAB — BLOOD GAS, VENOUS
Acid-Base Excess: 1.9 mmol/L (ref 0.0–2.0)
TCO2: 22.2 mmol/L (ref 0–100)
pCO2, Ven: 32.6 mmHg — ABNORMAL LOW (ref 45.0–50.0)
pO2, Ven: 41.6 mmHg (ref 30.0–45.0)

## 2011-02-21 LAB — URINALYSIS, ROUTINE W REFLEX MICROSCOPIC
Glucose, UA: NEGATIVE mg/dL
Specific Gravity, Urine: 1.024 (ref 1.005–1.030)
pH: 6 (ref 5.0–8.0)

## 2011-02-21 LAB — MRSA PCR SCREENING: MRSA by PCR: NEGATIVE

## 2011-02-21 LAB — GLUCOSE, CAPILLARY: Glucose-Capillary: 92 mg/dL (ref 70–99)

## 2011-02-21 LAB — PHOSPHORUS: Phosphorus: 2.9 mg/dL (ref 2.3–4.6)

## 2011-02-22 ENCOUNTER — Inpatient Hospital Stay (HOSPITAL_COMMUNITY): Payer: Medicare Other

## 2011-02-22 ENCOUNTER — Other Ambulatory Visit (INDEPENDENT_AMBULATORY_CARE_PROVIDER_SITE_OTHER): Payer: Self-pay | Admitting: Surgery

## 2011-02-22 DIAGNOSIS — J9600 Acute respiratory failure, unspecified whether with hypoxia or hypercapnia: Secondary | ICD-10-CM

## 2011-02-22 DIAGNOSIS — K650 Generalized (acute) peritonitis: Secondary | ICD-10-CM

## 2011-02-22 DIAGNOSIS — I471 Supraventricular tachycardia: Secondary | ICD-10-CM

## 2011-02-22 LAB — LIPASE, BLOOD: Lipase: 10 U/L — ABNORMAL LOW (ref 11–59)

## 2011-02-22 LAB — BLOOD GAS, ARTERIAL
Bicarbonate: 19.6 mEq/L — ABNORMAL LOW (ref 20.0–24.0)
Drawn by: 235321
Drawn by: 295031
FIO2: 0.21 %
MECHVT: 500 mL
O2 Saturation: 96 %
PEEP: 5 cmH2O
Patient temperature: 98.6
Patient temperature: 98.6
RATE: 12 resp/min
pH, Arterial: 7.441 — ABNORMAL HIGH (ref 7.350–7.400)

## 2011-02-22 LAB — BASIC METABOLIC PANEL
BUN: 31 mg/dL — ABNORMAL HIGH (ref 6–23)
CO2: 17 mEq/L — ABNORMAL LOW (ref 19–32)
Calcium: 8.5 mg/dL (ref 8.4–10.5)
Calcium: 7.4 mg/dL — ABNORMAL LOW (ref 8.4–10.5)
Creatinine, Ser: 1 mg/dL (ref 0.4–1.2)
GFR calc Af Amer: >60 mL/min (ref >60)
GFR calc non Af Amer: >60 mL/min (ref >60)
Glucose, Bld: 82 mg/dL (ref 70–99)
Potassium: 4.1 mEq/L (ref 3.5–5.1)
Sodium: 143 mEq/L (ref 135–145)

## 2011-02-22 LAB — GLUCOSE, CAPILLARY: Glucose-Capillary: 132 mg/dL — ABNORMAL HIGH (ref 70–99)

## 2011-02-22 LAB — HEPATIC FUNCTION PANEL
Bilirubin, Direct: 0.6 mg/dL — ABNORMAL HIGH (ref 0.0–0.3)
Indirect Bilirubin: 0.8 mg/dL (ref 0.3–0.9)
Total Bilirubin: 1.4 mg/dL — ABNORMAL HIGH (ref 0.3–1.2)

## 2011-02-22 LAB — CBC
MCH: 27.2 pg (ref 26.0–34.0)
MCH: 27.4 pg (ref 26.0–34.0)
MCHC: 31.8 g/dL (ref 30.0–36.0)
MCHC: 32 g/dL (ref 30.0–36.0)
MCV: 85.6 fL (ref 78.0–100.0)
Platelets: 215 10*3/uL (ref 150–400)
Platelets: 236 10*3/uL (ref 150–400)
RDW: 16.8 % — ABNORMAL HIGH (ref 11.5–15.5)

## 2011-02-22 LAB — DIFFERENTIAL
Basophils Relative: 0 % (ref 0–1)
Eosinophils Absolute: 0 10*3/uL (ref 0.0–0.7)
Eosinophils Relative: 0 % (ref 0–5)
Lymphs Abs: 0.5 10*3/uL — ABNORMAL LOW (ref 0.7–4.0)
Monocytes Absolute: 0.5 10*3/uL (ref 0.1–1.0)
Neutro Abs: 8.1 10*3/uL — ABNORMAL HIGH (ref 1.7–7.7)
Neutrophils Relative %: 89 % — ABNORMAL HIGH (ref 43–77)

## 2011-02-22 LAB — TYPE AND SCREEN: Antibody Screen: NEGATIVE

## 2011-02-22 LAB — MAGNESIUM: Magnesium: 2.3 mg/dL (ref 1.5–2.5)

## 2011-02-22 LAB — LACTIC ACID, PLASMA: Lactic Acid, Venous: 0.9 mmol/L (ref 0.5–2.2)

## 2011-02-22 LAB — POCT OCCULT BLOOD STOOL (DEVICE): Fecal Occult Bld: POSITIVE

## 2011-02-23 ENCOUNTER — Inpatient Hospital Stay (HOSPITAL_COMMUNITY): Payer: Medicare Other

## 2011-02-23 LAB — CBC
HCT: 23.8 % — ABNORMAL LOW (ref 36.0–46.0)
MCH: 27.9 pg (ref 26.0–34.0)
MCH: 27.6 pg (ref 26.0–34.0)
MCV: 85 fL (ref 78.0–100.0)
Platelets: 191 10*3/uL (ref 150–400)
RBC: 2.8 MIL/uL — ABNORMAL LOW (ref 3.87–5.11)
RBC: 2.75 MIL/uL — ABNORMAL LOW (ref 3.87–5.11)
WBC: 8.3 10*3/uL (ref 4.0–10.5)
WBC: 8.3 10*3/uL (ref 4.0–10.5)

## 2011-02-23 LAB — HEMOGLOBIN: Hemoglobin: 7.8 g/dL — ABNORMAL LOW (ref 12.0–15.0)

## 2011-02-23 LAB — DIFFERENTIAL
Basophils Absolute: 0 10*3/uL (ref 0.0–0.1)
Eosinophils Absolute: 0 10*3/uL (ref 0.0–0.7)
Lymphocytes Relative: 4 % — ABNORMAL LOW (ref 12–46)
Monocytes Absolute: 0.5 10*3/uL (ref 0.1–1.0)
Neutrophils Relative %: 90 % — ABNORMAL HIGH (ref 43–77)

## 2011-02-23 LAB — GLUCOSE, CAPILLARY: Glucose-Capillary: 99 mg/dL (ref 70–99)

## 2011-02-23 LAB — COMPREHENSIVE METABOLIC PANEL
Alkaline Phosphatase: 26 U/L — ABNORMAL LOW (ref 39–117)
BUN: 36 mg/dL — ABNORMAL HIGH (ref 6–23)
Chloride: 116 mEq/L — ABNORMAL HIGH (ref 96–112)
Creatinine, Ser: 1.73 mg/dL — ABNORMAL HIGH (ref 0.4–1.2)
GFR calc non Af Amer: 29 mL/min — ABNORMAL LOW (ref >60)
Glucose, Bld: 78 mg/dL (ref 70–99)
Potassium: 4.5 mEq/L (ref 3.5–5.1)
Total Bilirubin: 2.2 mg/dL — ABNORMAL HIGH (ref 0.3–1.2)

## 2011-02-24 DIAGNOSIS — E8779 Other fluid overload: Secondary | ICD-10-CM

## 2011-02-24 LAB — BASIC METABOLIC PANEL
CO2: 19 mEq/L (ref 19–32)
Calcium: 7 mg/dL — ABNORMAL LOW (ref 8.4–10.5)
Creatinine, Ser: 1.65 mg/dL — ABNORMAL HIGH (ref 0.4–1.2)
GFR calc Af Amer: 37 mL/min — ABNORMAL LOW (ref >60)
Glucose, Bld: 138 mg/dL — ABNORMAL HIGH (ref 70–99)

## 2011-02-24 LAB — GLUCOSE, CAPILLARY
Glucose-Capillary: 55 mg/dL — ABNORMAL LOW (ref 70–99)
Glucose-Capillary: 65 mg/dL — ABNORMAL LOW (ref 70–99)
Glucose-Capillary: 115 mg/dL — ABNORMAL HIGH (ref 70–99)
Glucose-Capillary: 121 mg/dL — ABNORMAL HIGH (ref 70–99)

## 2011-02-24 LAB — CBC
MCH: 27.5 pg (ref 26.0–34.0)
Platelets: 235 10*3/uL (ref 150–400)
RBC: 2.76 MIL/uL — ABNORMAL LOW (ref 3.87–5.11)

## 2011-02-25 ENCOUNTER — Inpatient Hospital Stay (HOSPITAL_COMMUNITY): Payer: Medicare Other

## 2011-02-25 DIAGNOSIS — E8779 Other fluid overload: Secondary | ICD-10-CM

## 2011-02-25 DIAGNOSIS — K650 Generalized (acute) peritonitis: Secondary | ICD-10-CM

## 2011-02-25 DIAGNOSIS — J9600 Acute respiratory failure, unspecified whether with hypoxia or hypercapnia: Secondary | ICD-10-CM

## 2011-02-25 DIAGNOSIS — I471 Supraventricular tachycardia, unspecified: Secondary | ICD-10-CM

## 2011-02-25 LAB — CBC
HCT: 22.2 % — ABNORMAL LOW (ref 36.0–46.0)
MCV: 84.7 fL (ref 78.0–100.0)
RBC: 2.62 MIL/uL — ABNORMAL LOW (ref 3.87–5.11)
WBC: 13.8 10*3/uL — ABNORMAL HIGH (ref 4.0–10.5)

## 2011-02-25 LAB — GLUCOSE, CAPILLARY
Glucose-Capillary: 108 mg/dL — ABNORMAL HIGH (ref 70–99)
Glucose-Capillary: 98 mg/dL (ref 70–99)
Glucose-Capillary: 119 mg/dL — ABNORMAL HIGH (ref 70–99)
Glucose-Capillary: 124 mg/dL — ABNORMAL HIGH (ref 70–99)

## 2011-02-25 LAB — BLOOD GAS, ARTERIAL
MECHVT: 500 mL
TCO2: 18 mmol/L (ref 0–100)
pCO2 arterial: 40.3 mmHg (ref 35.0–45.0)
pH, Arterial: 7.311 — ABNORMAL LOW (ref 7.350–7.400)

## 2011-02-25 LAB — BASIC METABOLIC PANEL
Calcium: 7.3 mg/dL — ABNORMAL LOW (ref 8.4–10.5)
Creatinine, Ser: 1.12 mg/dL (ref 0.4–1.2)
GFR calc Af Amer: 57 mL/min — ABNORMAL LOW (ref >60)
GFR calc non Af Amer: 47 mL/min — ABNORMAL LOW (ref >60)
Sodium: 146 mEq/L — ABNORMAL HIGH (ref 135–145)

## 2011-02-25 LAB — CULTURE, ROUTINE-ABSCESS

## 2011-02-25 LAB — IRON AND TIBC: Iron: <10 ug/dL — ABNORMAL LOW (ref 42–135)

## 2011-02-25 LAB — FERRITIN: Ferritin: 340 ng/mL — ABNORMAL HIGH (ref 10–291)

## 2011-02-26 ENCOUNTER — Inpatient Hospital Stay (HOSPITAL_COMMUNITY): Payer: Medicare Other

## 2011-02-26 LAB — BASIC METABOLIC PANEL
CO2: 22 mEq/L (ref 19–32)
Chloride: 111 mEq/L (ref 96–112)
GFR calc non Af Amer: 60 mL/min — ABNORMAL LOW (ref >60)
Glucose, Bld: 107 mg/dL — ABNORMAL HIGH (ref 70–99)
Potassium: 2.6 mEq/L — CL (ref 3.5–5.1)
Sodium: 142 mEq/L (ref 135–145)

## 2011-02-26 LAB — GLUCOSE, CAPILLARY
Glucose-Capillary: 96 mg/dL (ref 70–99)
Glucose-Capillary: 99 mg/dL (ref 70–99)
Glucose-Capillary: 91 mg/dL (ref 70–99)
Glucose-Capillary: 115 mg/dL — ABNORMAL HIGH (ref 70–99)

## 2011-02-26 LAB — CBC
HCT: 22.1 % — ABNORMAL LOW (ref 36.0–46.0)
Hemoglobin: 7.3 g/dL — ABNORMAL LOW (ref 12.0–15.0)
MCV: 83.4 fL (ref 78.0–100.0)
RBC: 2.65 MIL/uL — ABNORMAL LOW (ref 3.87–5.11)
WBC: 9.2 10*3/uL (ref 4.0–10.5)

## 2011-02-27 LAB — ANAEROBIC CULTURE

## 2011-02-27 LAB — GLUCOSE, CAPILLARY
Glucose-Capillary: 107 mg/dL — ABNORMAL HIGH (ref 70–99)
Glucose-Capillary: 140 mg/dL — ABNORMAL HIGH (ref 70–99)

## 2011-02-27 LAB — CULTURE, BLOOD (ROUTINE X 2): Culture: NO GROWTH

## 2011-02-27 LAB — CBC
HCT: 22.6 % — ABNORMAL LOW (ref 36.0–46.0)
Hemoglobin: 7.5 g/dL — ABNORMAL LOW (ref 12.0–15.0)
MCHC: 33.2 g/dL (ref 30.0–36.0)
MCV: 83.7 fL (ref 78.0–100.0)

## 2011-02-27 LAB — BASIC METABOLIC PANEL
CO2: 23 mEq/L (ref 19–32)
Calcium: 7.7 mg/dL — ABNORMAL LOW (ref 8.4–10.5)
Glucose, Bld: 105 mg/dL — ABNORMAL HIGH (ref 70–99)
Sodium: 140 mEq/L (ref 135–145)

## 2011-02-27 NOTE — H&P (Signed)
Victoria [name], Victoria [name]                ACCOUNT NO.:  [id number]  MEDICAL RECORD NO.:  [id number]           PATIENT TYPE:  E  LOCATION:  WLED                         [facility]ACILITY:  [facility]  PHYSICIAN:  Hartley Barefoot, MD    DATE OF BIRTH:  1932/02/04  DATE OF ADMISSION:  02/21/2011 DATE OF DISCHARGE:                             HISTORY & PHYSICAL   CHIEF COMPLAINT:  Abdominal distention and pain.  PRIMARY CARE PHYSICIAN:  Georgina Quint. Plotnikov, MD  HISTORY OF PRESENT ILLNESS:  This is a very pleasant 77 year old with past medical history of anxiety, psychotic break with paranoia, hypertension, prior history of partial hysterectomy and C-section, who presents to the emergency department, complaining of abdominal distention and tremors.  The patient relates that she started to have abdominal pain and distention 3 days prior to admission.  This has been accompanied by nausea and vomiting.  Her last bowel movement was the day prior to admission.  She relates that she has been passing gas, that she passed gas at the day of admission.  Initially, she was having some difficulty taking deep breaths.  She denies chest pain, shortness of breath.  ALLERGIES:  ASPIRIN and CONTRAST MEDIA.  VISTARIL caused some adverse effect.  PAST MEDICAL HISTORY: 1. Anxiety. 2. White collar hypertension. 3. History of diverticulitis. 4. Prior history of psychotic breakdown with paranoia and agitation. 5. History of diverticular bleed.  PAST SURGICAL HISTORY:  Partial hysterectomy and C-section x2.  MEDICATIONS: 1. Ativan. 2. Norvasc 5 mg p.o. daily, although the patient has no refill of this     since 2011. 3. Probiotic 1 tablet daily. 4. Multivitamins 1 tablet daily. 5. Lorazepam 1 mg twice a day as needed. 6. Fish oil 1 tablet daily.  SOCIAL HISTORY:  Denies smoking, alcohol or recreational drugs.  She is a retired Runner, broadcasting/film/video.  She has 2 daughters.  FAMILY HISTORY:  Positive for diabetes in her sister,  otherwise noncontributory.  REVIEW OF SYSTEMS:  Negative, except as per HPI.  Denies chest pain, weakness, muscle pain, diarrhea.  PHYSICAL EXAMINATION:  VITAL SIGNS:  Blood pressure 114/77, respirations 16 to 28, temperature 98.4, blood pressure 114/77, sat 90% on room air, pulse 124. GENERAL:  The patient is sitting in bed, in no acute distress, speaking in full sentences and NG tube in place. HEENT:  Head is traumatic, normocephalic.  Eyes anicteric.  Pupils are equal and reactive to light.  Extraocular muscles intact. NECK:  Supple.  No rigidity and no carotid bruits. CARDIOVASCULAR:  S1, S2, tachycardic.  Regular rhythm and rate.  No rubs or gallops. LUNGS:  Bilateral good air movement.  No crackles, wheezing or rhonchi. ABDOMEN:  Bowel sounds decreased, distended and soft to palpation.  No rigidity.  No guarding.  Tender to palpation but mildly. EXTREMITIES:  No edema.  No cyanosis. NEUROLOGIC:  Alert and oriented x3.  Cranial nerves II through XII intact.  Sensation grossly intact.  Motor strength 5/5 throughout.  LABORATORY DATA:  Admission labs, white blood cell 2.9, hemoglobin 11.8, platelet 229,000, lactic acid 1.9.  Sodium 136, potassium 3.6, chloride 24, glucose 145, BUN 36, creatinine  1.09.  Abdominal x-ray showed diffuse distention of small bowel loop to 5.8 cm in maximum diameter with mild distention of the stomach.  Finding concerning for high-grade small bowel obstruction.  Air and stool is still seen within the colon. Hypodense lungs with mild bibasilar atelectasis.  X-ray, nasogastric tube seen coiling within the stomach.  Persistent diffuse dilation of small bowel loops, likely reflecting high-grade small bowel obstruction. No definite free intra-abdominal air seen.  Mild bibasilar atelectasis noted.  ASSESSMENT AND PLAN:  This is a 77 year old who presented with abdominal distention, found to have a high-grade small bowel obstruction. 1. Small-bowel  obstruction.  Surgery already consulted.  They want to     proceed with medical management.  NG tube, IV fluid, n.p.o.  We     will replete potassium.  Will monitor closely. 2. Supraventricular tachycardia.  We will admit the patient to the     ICU.  The patient's heart rate initially 188 to 208 in the ED.  She     received Cardizem and adenosine by ED physician.  Her heart rate     has decreased into the 120s.  This is likely secondary to acute     illness, small-bowel obstruction.  I will check TSH, cardiac     enzymes, 2-D echo.  Will continue with IV fluids.  Will check mag     level.  I will consult cardiology to help with management. 3. Hypertension.  I will hold at this time blood pressure medications. 4. Anxiety.  I will continue with Ativan p.r.n. 5. Tachypnea, maybe component of abdominal distention.  Will monitor     closely in the ICU.  Keep oxygen saturation above 90. RR decrease to 20. 6. Anemia.  Will guaiac stool.  Monitor hemoglobin.  She had a prior     history of multiple myeloma. 7. Deep venous thrombosis prophylaxis, heparin at this time. 8. Leukopenia.  Will check B12, not neutropenic.  Unclear if she was     on chemo for her multiple myeloma. 9. Uremia.  Will continue with IV fluids.     Hartley Barefoot, MD     BR/MEDQ  D:  02/21/2011  T:  02/21/2011  Job:  [id number]  Electronically Signed by Hartley Barefoot MD on 02/27/2011 10:28:22 PM

## 2011-02-28 ENCOUNTER — Telehealth: Payer: Self-pay | Admitting: *Deleted

## 2011-02-28 LAB — GLUCOSE, CAPILLARY
Glucose-Capillary: 137 mg/dL — ABNORMAL HIGH (ref 70–99)
Glucose-Capillary: 134 mg/dL — ABNORMAL HIGH (ref 70–99)

## 2011-02-28 LAB — COMPREHENSIVE METABOLIC PANEL
ALT: 42 U/L — ABNORMAL HIGH (ref 0–35)
AST: 42 U/L — ABNORMAL HIGH (ref 0–37)
Alkaline Phosphatase: 90 U/L (ref 39–117)
GFR calc non Af Amer: >60 mL/min (ref >60)
Glucose, Bld: 129 mg/dL — ABNORMAL HIGH (ref 70–99)
Total Bilirubin: 1.8 mg/dL — ABNORMAL HIGH (ref 0.3–1.2)

## 2011-02-28 LAB — CBC
MCH: 27.7 pg (ref 26.0–34.0)
MCHC: 33.2 g/dL (ref 30.0–36.0)
MCV: 83.5 fL (ref 78.0–100.0)
Platelets: 412 10*3/uL — ABNORMAL HIGH (ref 150–400)
RDW: 17.1 % — ABNORMAL HIGH (ref 11.5–15.5)
WBC: 10.8 10*3/uL — ABNORMAL HIGH (ref 4.0–10.5)

## 2011-02-28 NOTE — Telephone Encounter (Signed)
Noted Sorry to here it. Thx

## 2011-02-28 NOTE — Telephone Encounter (Signed)
FYI - Daughter called, pt is inpt for perforated bowel.

## 2011-02-28 NOTE — Consult Note (Signed)
Victoria [name]                ACCOUNT NO.:  [id number]  MEDICAL RECORD NO.:  [id number]           PATIENT TYPE:  E  LOCATION:  WLED                         FACILITY:  [facility]  PHYSICIAN:  Currie Paris, M.D.DATE OF BIRTH:  05/18/1932  DATE OF CONSULTATION:  02/21/2011 DATE OF DISCHARGE:                                CONSULTATION   REQUESTING PHYSICIAN:  Paula Libra, MD  PRIMARY CARE PHYSICIAN:  Georgina Quint. Plotnikov, M.D.  CONSULTING SURGEON:  Currie Paris, M.D.  REASON FOR CONSULTATION:  Small-bowel obstruction.  HISTORY OF PRESENT ILLNESS:  Victoria [name] is a 77 year old black female with a history of anxiety, diverticulitis, multiple myeloma and white coat hypertension who feels like she ate some bad meat approximately three days ago.  After she ate this, she developed some abdominal pain along with some nausea, but no emesis.  Over the last several days she has begun to feel more bloated.  She does feel like her pain is better now that she is here in the Emergency Department.  She did have a bowel movement that was normal yesterday and states that she has been passing flatus the whole time.  The patient, I believe, was brought to the Emergency Department by her daughter due to worsening abdominal distention and pain.  Upon arrival she had a workup which included an abdominal x-ray, which showed significant small-bowel dilatation up to 5.8 cm consistent with a bowel obstruction.  Because of this finding, we have been asked to evaluate the patient.  REVIEW OF SYSTEMS:  Please see HPI.  Otherwise all other systems have currently been reviewed and are negative.  FAMILY HISTORY:  Noncontributory.  PAST MEDICAL HISTORY: 1. Diverticulitis. 2. White coat hypertension. 3. Multiple myeloma. 4. Anxiety. 5. History of psychotic break with paranoia. 6. History of tinnitus.  PAST SURGICAL HISTORY:  Per the E-Chart, as the patient is unable to recall all of her  surgeries: 1. Cesarean section x2. 2. Partial hysterectomy.  SOCIAL HISTORY:  The patient lives with her daughter.  She denies any alcohol, tobacco or illicit drug abuse.  MEDICATIONS:  Several medications are noted in  [facility]; however, the pharmacist states that the only one that the patient has filled recently has been her Ativan, which is 1 mg q.4-6 hours p.r.n., I believe, but we are waiting for confirmation of the dose.  ALLERGIES: 1. ASPIRIN. 2. Questionable allergy to CONTRAST MEDIA.  PHYSICAL EXAMINATION:  GENERAL:  Victoria [name] is a very pleasant 77 year old black female who is currently lying in bed in no acute distress. VITAL SIGNS:  Temperature 98.4, pulse 121, however, it has gone into SVT as high as 208.  Blood pressure 114/77, respirations 16. HEENT:  Head is normocephalic, atraumatic.  Sclerae are noninjected. Pupils are equal, round and reactive to light.  Ears and nose without any obvious masses or lesions.  However, she does have an NG tube present with feculent output of approximately 800 cc at this time. Mouth is pink, but dry.HEART:  Regular rhythm.  However, she is tachycardic in the 120s.  She has a normal S1-S2.  No obvious  murmurs, gallops or rubs are noted.  She does have palpable carotid, renal and pedal pulses bilaterally. LUNGS:  Clear to auscultation bilaterally.  No wheezes, rhonchi or rales are noted.  Respiratory effort is nonlabored. ABDOMEN:  Soft, but diffusely mildly tender to palpation.  She has hypoactive to absent bowel sounds.  She is otherwise distended and tympanitic.  She does have a lower Pfannenstiel scar, but no other scars, masses or hernias are noted on her abdomen.  MUSCULOSKELETAL: All four extremities are symmetrical with no cyanosis, clubbing or edema. SKIN:  Warm and dry with no masses, lesions or rashes.  However, she does have several actinic keratoses noted all over the lower portion of her abdomen. PSYCHIATRIC:  The  patient is currently alert and oriented x3 with an appropriate affect.  LABORATORY DATA:  White blood cell count 2900, hemoglobin 11.8, hematocrit 33.8, platelet count is 229,000.  Sodium 136, potassium 3.6, glucose 145, BUN 36, creatinine 1.09.  Lactic acid is 1.9.  DIAGNOSTICS:  Acute abdominal series revealed diffuse distention of small-bowel loops up to 5.8 cm with mild distention of the stomach and associated air-fluid levels.  Findings are concerning for a high-grade small-bowel obstruction.  There is air and stool in the colon and no free air visible.  IMPRESSION: 1. Small-bowel obstruction. 2. Tachycardia with supraventricular tachycardia. 3. Anxiety. 4. White coat hypertension. 5. Multiple myeloma.  PLAN:  We agree with medical admission.  We also agree with placement of an NG tube and bowel rest for conservative management. I have explained this concept to the patient.  I have also explained to the patient that if she does not improve with bowel rest and conservative management, she may require surgical intervention.  In the meantime we will repeat abdominal x-rays in the morning and follow the patient along with you.     Letha Cape, PA   ______________________________ Currie Paris, M.D.    KEO/MEDQ  D:  02/21/2011  T:  02/21/2011  Job:  [id number]  cc:   Paula Libra, MD Fax: [phone]  Georgina Quint. Plotnikov, MD [address] [place] [address]  Electronically Signed by Barnetta Chapel PA on 02/25/2011 12:48:07 PM Electronically Signed by Cyndia Bent M.D. on 02/28/2011 01:14:59 PM

## 2011-03-01 LAB — BASIC METABOLIC PANEL
BUN: 6 mg/dL (ref 6–23)
Calcium: 7 mg/dL — ABNORMAL LOW (ref 8.4–10.5)
Chloride: 105 mEq/L (ref 96–112)
Creatinine, Ser: 0.67 mg/dL (ref 0.4–1.2)
GFR calc Af Amer: >60 mL/min (ref >60)

## 2011-03-01 LAB — GLUCOSE, CAPILLARY
Glucose-Capillary: 111 mg/dL — ABNORMAL HIGH (ref 70–99)
Glucose-Capillary: 123 mg/dL — ABNORMAL HIGH (ref 70–99)
Glucose-Capillary: 113 mg/dL — ABNORMAL HIGH (ref 70–99)

## 2011-03-02 LAB — BASIC METABOLIC PANEL
BUN: 6 mg/dL (ref 6–23)
CO2: 23 mEq/L (ref 19–32)
Calcium: 7.4 mg/dL — ABNORMAL LOW (ref 8.4–10.5)
Chloride: 108 mEq/L (ref 96–112)
Creatinine, Ser: 0.65 mg/dL (ref 0.4–1.2)

## 2011-03-02 LAB — GLUCOSE, CAPILLARY
Glucose-Capillary: 107 mg/dL — ABNORMAL HIGH (ref 70–99)
Glucose-Capillary: 112 mg/dL — ABNORMAL HIGH (ref 70–99)
Glucose-Capillary: 96 mg/dL (ref 70–99)
Glucose-Capillary: 122 mg/dL — ABNORMAL HIGH (ref 70–99)
Glucose-Capillary: 114 mg/dL — ABNORMAL HIGH (ref 70–99)

## 2011-03-03 LAB — GLUCOSE, CAPILLARY
Glucose-Capillary: 105 mg/dL — ABNORMAL HIGH (ref 70–99)
Glucose-Capillary: 109 mg/dL — ABNORMAL HIGH (ref 70–99)
Glucose-Capillary: 118 mg/dL — ABNORMAL HIGH (ref 70–99)

## 2011-03-04 LAB — GLUCOSE, CAPILLARY
Glucose-Capillary: 120 mg/dL — ABNORMAL HIGH (ref 70–99)
Glucose-Capillary: 89 mg/dL (ref 70–99)

## 2011-03-05 LAB — CBC
HCT: 23.4 % — ABNORMAL LOW (ref 36.0–46.0)
Hemoglobin: 7.5 g/dL — ABNORMAL LOW (ref 12.0–15.0)
MCV: 86.7 fL (ref 78.0–100.0)
RDW: 18 % — ABNORMAL HIGH (ref 11.5–15.5)
WBC: 6.1 10*3/uL (ref 4.0–10.5)

## 2011-03-06 ENCOUNTER — Other Ambulatory Visit: Payer: Self-pay | Admitting: *Deleted

## 2011-03-06 ENCOUNTER — Telehealth: Payer: Self-pay | Admitting: *Deleted

## 2011-03-06 MED ORDER — AMLODIPINE BESYLATE 5 MG PO TABS: 5.0000 mg | ORAL_TABLET | Freq: Every day | ORAL | Status: DC

## 2011-03-06 NOTE — Telephone Encounter (Signed)
Noted. Thx.

## 2011-03-06 NOTE — Telephone Encounter (Signed)
[facility] RN called w/update. Pt is home and [facility] will be following her for wound/ostomy care and med monitoring. FYI

## 2011-03-10 ENCOUNTER — Ambulatory Visit: Payer: Medicare Other | Admitting: Internal Medicine

## 2011-03-10 DIAGNOSIS — Z0289 Encounter for other administrative examinations: Secondary | ICD-10-CM

## 2011-03-11 NOTE — Op Note (Signed)
NAMEMARQUASIA, [name]                ACCOUNT NO.:  [id number]  MEDICAL RECORD NO.:  [id number]           PATIENT TYPE:  I  LOCATION:  1223                         FACILITY:  [facility]  PHYSICIAN:  Ardeth Sportsman, MD     DATE OF BIRTH:  07-Jan-1932  DATE OF PROCEDURE:  02/22/2011 DATE OF DISCHARGE:                              OPERATIVE REPORT   PRIMARY CARE PHYSICIAN:  Georgina Quint. Plotnikov, MD  OPERATING SURGEON:  Ardeth Sportsman, MD, FACS  ASSISTANT:  Angelia Mould. Derrell Lolling, MD, FACS  PREOPERATIVE DIAGNOSIS:  Peritonitis, most likely secondary to small bowel obstruction with possible ischemia.  POSTOPERATIVE DIAGNOSES: 1. Hinchey class III peritonitis. 2. Severe left colon diverticulosis with sigmoid diverticulitis and     perforation.  PROCEDURE PERFORMED: 1. Exploratory laparotomy. 2. Abdominal washout of peritonitis. 3. Mobilization of splenic flexure of the colon. 4. Left hemicolectomy with end-transverse colostomy.  ANESTHESIA: 1. General anesthesia. 2. Local anesthetic and field block around the midline incision.  SPECIMENS: 1. Pelvic fluid for culture.  Initial Gram stain shows gram-negative     cocci and gram-negative rods and numerous PMNs. 2. Left colon stitches in the distal segment.  ESTIMATED BLOOD LOSS:  100 mL.  DRAINS:  None.  COMPLICATIONS:  None apparent.  INDICATIONS:  Victoria [name] is a 77 year old female with prior history of anxiety and psychotic break with paranoia in the past with prior abdominal surgeries.  She began to have abdominal pain and distention about 3 days prior to admission.  She also had some tremors.  She came to the emergency room with SVT and was emergently admitted into the ICU. Critical care and cardiology consults have been made.  She initially had a bump in the troponins and CKMB concerning for myocardial infarction, but Cardiology feels it is more like a cardiac strain.  She had an x-ray, which showed evidence of a probable small  bowel obstruction.  She was immediately hydrated and placed on IV fluids and had serial abdominal examinations.  This morning, her abdominal pain became more intense.  She had a temperature spike.  She had a CT scan, which was difficult to assess given the inability to give contrast. However, there were concerns of possible doubts of free air and at least a high-grade small bowel obstruction.  She had significant diverticulosis on the left sigmoid side, which was noted in the past.  Given the fact that she had worsening abdominal pain and her cardiac function was better resuscitated, I was concerned about either necrosis or some other significant event that was life-threatening and recommend laparotomy.  I had a long discussion with the patient's daughter with numerous discussions.  The procedure risks, techniques, alternatives, benefits were discussed, possible postoperative scenarios were discussed as well. Questions were answered and the daughter and patient agreed to proceed.  OPERATIVE FINDINGS:  She had diffuse peritonitis in all quadrants, Hinchey class III.  She had extensive diverticulosis all the way to the splenic flexure of the colon.  She had thickening up the mid descending colon going down to sigmoid with a very thick phlegmon, consistent with diverticulitis.  Her  rectum was clear.  Her gallbladder and liver were clear.  Her small bowel had some proximal dilation, but no true transition point and no evidence of any significant adhesions or transition point.  She had some inflammation around her appendix, but this was secondary inflammation.  No evidence of any primary appendicitis.  DESCRIPTION OF PROCEDURE:  Informed consent was confirmed.  The patient was already on IV vancomycin and Zosyn.  She underwent general anesthesia without any difficulty.  She had an arterial line in place. She was already on an amiodarone drip for better control of her SVT. She was  positioned supine.  Her abdomen was prepped and draped in sterile fashion.  Surgical time-out confirmed our plan.  I entered the abdomen through a periumbilical midline incision.  I encountered and dilated small bowel and phlegmon, but no definite necrosis.  I extended the incision to more cephalad inferiorly.  I eviscerated the small bowel out.  I could easily finger fracture some mild inflammatory adhesions between small bowel loops with fair amount of inflammatory peel.  I did get into some mild purulent ascites down in the pelvis and we swabbed that for culture.  I did eviscerate the  bowel out.  I could easily run the small bowel and could not find any chronic abdominal adhesions or any significant abdominal adhesions.  Up on the liver, there was some fair amount of free fluid as well, but no major purulence.  The gallbladder and liver looked fine.  I looked down and followed the colon and down in the left colon in the proximal descending colon, there was significant diverticulosis and thickening all the way down to the sigmoid colon.  She had a very thick sigmoid phlegmon, highly suspicious for perforated diverticulitis.  I did not see any feculent contamination.  We had mobilized the left colon starting at the peritoneal reflections and freeing the colon off the adhesions to free both ovaries off the rectosigmoid region as there was some dense inflammation there.  I was able to elevate the sigmoid colon up well and mobilize it.  I could see the retroperitoneal structures on left side and including the left ureter and we kept that posterior at all times.  I mobilized it in the lateral and medial fashion.  She had significant thickening all the way up nearly to the splenic flexure of the colon and significant diverticulosis extremely carpeted with it.  Based on that, I felt that she would require splenic flexure mobilization to find and transition point to more soft,  noninflammed colon as her mid transverse colon was obviously noninflamed.  Therefore, I gradually freed some adhesions from the transverse colon to the proximal descending colon and also freed some of the greater omentum off the mid transverse colon and followed that distally in splenic flexure. I came and mobilized in lateral medial fashion as well as came posterior and freed the splenic flexure of the colon off its adhesions to the left kidney and pancreas and able to sweep around and elevate that.  With that, I was able to elevate the splenic flexure and completely mobilize it off.  I freed the transverse colon off the stomach and the greater omentum off that as well.  At that point, I could see at least massive carpeting diverticulosis stopped just in the distal transverse colon and hepatic flexure.  At that point, the colon was nice and soft and not inflamed.  We decided to resect the left colon as she had some  extra redundant transverse colon.  I transected at the rectosigmoid junction to where there was rectum and no more diverticula using a GIA stapler.  I placed 0 Prolene stitches at the ends of the rectal stumps.  I ended up taking the mesentery in a radial fashion high on the sigmoid colon seeing the left ureter, retroperitoneal structures and staying away from it.  I came around the splenic flexure, there was already a defect at the splenic flexure of the colon, so we decided to go proximal to that.  Her left middle colic artery was really contracted and limited mobility first be able to help the left colon and reach up for a possible colostomy, so I ended up doing a high ligation on the left in the middle colic branch.  However, she had a good marginal artery and she had a strong main middle colic artery branch that had more stretch to it and we preserved that.  I wound up transecting at the distal transverse colon.  That looked pink and viable.  We marked the specimen and  sent it off.  I did copious irrigation of all 4 quadrants in the pelvis using about 10 L of saline until p.o. was calmed down and had much more clear return. I ensured hemostasis was good on the pelvis and the retroperitoneum all the way up to the splenic flexure. She had some omental adhesions to the spleen, but we had stayed away from that and I found no active bleeding. The omentum was rather ischemic that we had freed off, so we ligated that radially and transected that off.  Since her mesentery and omentum were very thinned out, we decided to keep those together as I was worried that sacrificing both the flaps further to the omentum ends in risk injuring the mesenteries and kept that together.  We created a defect in the left supraumbilical paramedian region making a 3 cm circular defect at the skin and then splitting the anterior rectus fascia transversely, splitting the rectus muscle vertically, and the posterior rectus fascia vertically.  I could dilate up to 2 fingers and I could easily bring up the distal transverse colon stump up through that wound well with 5 cm out.  We protected that area.  We did irrigation and then we closed the abdomen using running #1 PDS with good fascial bites.  I did use some staples around the umbilicus and about every 5 cm debriding the wound partially together as the tissues were viable, but given the fact that she had frank peritonitis, contamination, and did not feel it was safe to completely close the wound.  Because there was no specific abscess pocket, we both agreed there was no benefit to drain at this time.  Once the wound was packed and set aside, we had matured the colostomy. I used scissors and trimmed off the staple line at the end colostomy.  I had healthy bleeding from the mucosa and the mucosa was pink and plain, good healthy viability.  I matured it in a Brook-like fashion using interrupted 3-0 Vicryl stitches with a 3 cm nice  rosebud hump.  Given the significant peritonitis, we decided to keep the patient intubated and she was sent to the ICU on some phenylephrine but her tachycardia resolved and her blood pressure pretty reasonable.  She had had some 115 L of urine output for only  2 hours.  I am about to discuss postoperative findings with the patient's family.  Ardeth Sportsman, MD     SCG/MEDQ  D:  02/22/2011  T:  02/23/2011  Job:  [id number]  cc:   Georgina Quint. Plotnikov, MD [address] [place] [address]  Electronically Signed by Karie Soda MD on 03/11/2011 04:05:56 PM

## 2011-03-15 ENCOUNTER — Ambulatory Visit (INDEPENDENT_AMBULATORY_CARE_PROVIDER_SITE_OTHER): Payer: Medicare Other | Admitting: Family Medicine

## 2011-03-15 ENCOUNTER — Encounter: Payer: Self-pay | Admitting: Family Medicine

## 2011-03-15 DIAGNOSIS — I10 Essential (primary) hypertension: Secondary | ICD-10-CM

## 2011-03-15 DIAGNOSIS — L299 Pruritus, unspecified: Secondary | ICD-10-CM

## 2011-03-15 NOTE — Patient Instructions (Signed)
Hopefully the itching will subside in the next several days  Try zyrtec 10 mg once daily over the counter Can also use benadryl as directed for breakthrough itching  Keep cool - with cool mist or water and fan  Cut fingernails short to discourage scratching  If no improvement Monday-- follow up with your regular doctor  If worse - please call

## 2011-03-15 NOTE — Assessment & Plan Note (Signed)
Itching residual - after hives (pcn rxn in hosp) Rash is better but itching remains  Cannot have prednisone due to very recent infection  Will try zyrtec 10 daily Also benadryl as needed sympt care-keep cool -see inst  If worse or not imp call and f/u

## 2011-03-15 NOTE — Progress Notes (Signed)
Subjective:    Patient ID: Victoria [name], female    DOB: [birth year], 77 y.o.   MRN: [id number]  HPI  hosp 5/25 perf bowel -- colost bag now  Was on IV zosyn Then po amox -- 2nd dose hives everywhere so d//c it  Ever since then -- just itches all over  Tried benadryl / topical creams also  Also tried hydroxyzine - did not help muchy  Pharmacist recommended -- first generation antihist    Periacitin (cyproheptadine) ? -- unsure if that is availible    Rash is better -- but itch is from the inside out  No hx of liver problems  Is miserable with itching   No abx now  Infection is under control  Last wbc ct was fine  That is all much imp  No cough or wheeze  Some chronic post nasal drip  No swelling of mouth/ tongue or throat  Patient Active Problem List  Diagnoses  . MULTIPLE  MYELOMA  . ANEMIA OF OTHER CHRONIC DISEASE  . PSYCHOTIC DISORDER W/DELUSIONS CONDS CLASS ELSW  . ANXIETY  . INSOMNIA, PERSISTENT  . HEARING DEFICIT  . HYPERTENSION  . VERTIGO  . FATIGUE  . ATAXIA  . TACHYCARDIA  . Itching   Past Medical History  Diagnosis Date  . Hypertension   . Insomnia   . Depression, psychotic   . Anxiety    No past surgical history on file. History  Substance Use Topics  . Smoking status: Never Smoker   . Smokeless tobacco: Not on file  . Alcohol Use: No   Family History  Problem Relation Age of Onset  . Hypertension Other    Allergies  Allergen Reactions  . Amoxicillin Hives and Itching  . Aspirin   . Hydroxyzine Pamoate     REACTION: wierd feeling  . Tetracycline Hcl    Current Outpatient Prescriptions on File Prior to Visit  Medication Sig Dispense Refill  . amLODipine (NORVASC) 5 MG tablet Take 1 tablet (5 mg total) by mouth daily.  90 tablet  1  . cholecalciferol (VITAMIN D) 1000 UNITS tablet Take 1,000 Units by mouth daily.        Marland Kitchen LORazepam (ATIVAN) 1 MG tablet Take 1 tablet (1 mg total) by mouth 2 (two) times daily as needed for anxiety.  60  tablet  0  . Eflornithine HCl (VANIQA) 13.9 % cream Apply 1 application topically daily.             Review of Systems Review of Systems  Constitutional: Negative for fever, appetite change, fatigue and unexpected weight change.  Eyes: Negative for pain and visual disturbance.  Respiratory: Negative for cough and shortness of breath.   Cardiovascular: Negative. For cp  Gastrointestinal: Negative for nausea, diarrhea and constipation.  Genitourinary: Negative for urgency and frequency.  Skin: Negative for pallor.pos for itching without rash   Neurological: Negative for weakness, light-headedness, numbness and headaches.  Hematological: Negative for adenopathy. Does not bruise/bleed easily.  Psychiatric/Behavioral: Negative for dysphoric mood. The patient is not nervous/anxious.         Objective:   Physical Exam  Constitutional: She appears well-developed and well-nourished. No distress.       Frail appearing elderly female in wheelchair with poor hearing   HENT:  Head: Normocephalic and atraumatic.  Nose: Nose normal.  Mouth/Throat: Oropharynx is clear and moist.  Eyes: Conjunctivae and EOM are normal. Pupils are equal, round, and reactive to light. Right eye exhibits no discharge. Left  eye exhibits no discharge. No scleral icterus.  Neck: Normal range of motion. Neck supple. No thyromegaly present.  Cardiovascular: Normal rate and regular rhythm.   Pulmonary/Chest: Effort normal and breath sounds normal. She has no wheezes.  Abdominal: Soft. Bowel sounds are normal. She exhibits no distension. There is no tenderness.       Colostomy bag is intact  Musculoskeletal: She exhibits no tenderness.       Trace ankle edema   Lymphadenopathy:    She has no cervical adenopathy.  Neurological: No cranial nerve deficit.  Skin: Skin is warm and dry. No rash noted. No erythema. No pallor.       Pt claims to itch all over No rash or excoriations seen   Psychiatric:       Pt seems  generally anxious Hard to communicate due to poor hearing  Caregiver is very helpful          Assessment & Plan:

## 2011-03-16 NOTE — Assessment & Plan Note (Signed)
bp is up today - but pt seems somewhat agitated by her symptoms  Recommended close f/u by her primary physician

## 2011-03-17 ENCOUNTER — Encounter: Payer: Self-pay | Admitting: Internal Medicine

## 2011-03-17 ENCOUNTER — Telehealth: Payer: Self-pay

## 2011-03-17 ENCOUNTER — Encounter (INDEPENDENT_AMBULATORY_CARE_PROVIDER_SITE_OTHER): Payer: Self-pay | Admitting: Surgery

## 2011-03-17 ENCOUNTER — Ambulatory Visit (INDEPENDENT_AMBULATORY_CARE_PROVIDER_SITE_OTHER): Payer: Medicare Other | Admitting: Internal Medicine

## 2011-03-17 VITALS — BP 160/76 | HR 88 | Temp 98.2°F | Resp 16 | Ht 62.5 in | Wt 111.0 lb

## 2011-03-17 DIAGNOSIS — L299 Pruritus, unspecified: Secondary | ICD-10-CM

## 2011-03-17 DIAGNOSIS — R000 Tachycardia, unspecified: Secondary | ICD-10-CM

## 2011-03-17 DIAGNOSIS — C9000 Multiple myeloma not having achieved remission: Secondary | ICD-10-CM

## 2011-03-17 DIAGNOSIS — I10 Essential (primary) hypertension: Secondary | ICD-10-CM

## 2011-03-17 MED ORDER — CETIRIZINE HCL 10 MG PO TABS: 10.0000 mg | ORAL_TABLET | Freq: Every day | ORAL | Status: DC

## 2011-03-17 MED ORDER — ATENOLOL 50 MG PO TABS: 50.0000 mg | ORAL_TABLET | Freq: Two times a day (BID) | ORAL | Status: DC

## 2011-03-17 MED ORDER — HYDROXYZINE HCL 25 MG PO TABS: 25.0000 mg | ORAL_TABLET | Freq: Three times a day (TID) | ORAL | Status: AC | PRN

## 2011-03-17 MED ORDER — TRIAMCINOLONE ACETONIDE 0.5 % EX CREA: TOPICAL_CREAM | Freq: Three times a day (TID) | CUTANEOUS | Status: DC

## 2011-03-17 MED ORDER — METHYLPREDNISOLONE ACETATE 80 MG/ML IJ SUSP
120.0000 mg | Freq: Once | INTRAMUSCULAR | Status: AC
  Administered 2011-03-17: 120 mg via INTRAMUSCULAR

## 2011-03-17 NOTE — Assessment & Plan Note (Signed)
See Meds 

## 2011-03-17 NOTE — Progress Notes (Signed)
  Subjective:    Patient ID: Victoria [name], female    DOB: 1932/01/24, 77 y.o.   MRN: [id number]  HPI She had rash on June 5th from Amoxicillin C/o itching since then C/o dry throat C/o hematomas Post-hosp visit for diverticulitis/colect/colostomy   Review of Systems  Constitutional: Positive for activity change, appetite change, fatigue and unexpected weight change (wt loss). Negative for chills.  HENT: Positive for sore throat. Negative for ear pain, congestion, sneezing, mouth sores, postnasal drip and sinus pressure.        Dry mouth  Eyes: Negative for discharge and visual disturbance.  Respiratory: Positive for shortness of breath. Negative for cough and chest tightness.   Cardiovascular: Negative for leg swelling.  Gastrointestinal: Positive for abdominal pain. Negative for nausea, constipation and blood in stool.  Genitourinary: Negative for dysuria, frequency, difficulty urinating and vaginal pain.  Musculoskeletal: Negative for back pain and gait problem.  Skin: Negative for pallor and rash.  Neurological: Positive for weakness. Negative for dizziness, tremors, numbness and headaches.  Psychiatric/Behavioral: Negative for confusion and sleep disturbance. The patient is nervous/anxious.        Objective:   Physical Exam  Constitutional: She appears well-developed. No distress.       Thin, NAD Chronically ill appearing  HENT:  Head: Normocephalic.  Right Ear: External ear normal.  Left Ear: External ear normal.  Nose: Nose normal.  Mouth/Throat: Oropharynx is clear and moist.  Eyes: Conjunctivae are normal. Pupils are equal, round, and reactive to light. Right eye exhibits no discharge. Left eye exhibits no discharge.  Neck: Normal range of motion. Neck supple. No JVD present. No tracheal deviation present. No thyromegaly present.  Cardiovascular: Normal rate, regular rhythm and normal heart sounds.   Pulmonary/Chest: No stridor. No respiratory distress. She has no  wheezes. She has no rales. She exhibits no tenderness.  Abdominal: Soft. Bowel sounds are normal. She exhibits no distension and no mass. There is tenderness (mild). There is no rebound and no guarding.  Musculoskeletal: She exhibits no edema and no tenderness.  Lymphadenopathy:    She has no cervical adenopathy.  Neurological: She displays normal reflexes. No cranial nerve deficit. She exhibits normal muscle tone. Coordination normal.  Skin: No rash noted. No erythema.  Psychiatric: Her behavior is normal. Judgment and thought content normal.       Flat affect  Colost bag L Thrombosed R prox forearm     Wt Readings from Last 3 Encounters:  03/17/11 111 lb (50.349 kg)  03/15/11 120 lb 1.9 oz (54.486 kg)  12/14/09 115 lb (52.164 kg)     Assessment & Plan:  A complex case Hosp records and tests reviewed

## 2011-03-17 NOTE — Patient Instructions (Signed)
Hold Amiodarone x 2 wks to see if itching better Start Tenormin if heart rate goes up

## 2011-03-17 NOTE — Assessment & Plan Note (Signed)
On Rx per Oncology

## 2011-03-17 NOTE — Assessment & Plan Note (Signed)
Suspected Amiodarone. Will hold See meds Depo 120 mg IM

## 2011-03-17 NOTE — Telephone Encounter (Signed)
[facility] Triage Call Report Triage Record Num: [id number] Operator: Ether Griffins Patient Name: Victoria [name] Call Date & Time: 03/15/2011 9:40:03AM Patient Phone: [phone] PCP: Patient Gender: Female PCP Fax : Patient DOB: 07-18-1932 Practice Name: [facility] Reason for Call: Victoria [name]/ daughter calling about itchy rash--rash is almost gone but still systemically itchy.Onset 02/22/11.Had surgery on 02/22/11--has not followed up with surgeon yet.Tried Benadryl,Hydroxyzine,Sarna cream with some effect but not helping totally.Afebrile.All emergent sxs of Rash r/o.Contacted office and appt was scheduled for today(03/15/11) at 1100. Protocol(s) Used: Rash Recommended Outcome per Protocol: See Provider within 4 hours Reason for Outcome: Evaluated by provider AND symptoms worsening when following recommended treatment plan Care Advice: ~ Avoid substances associated with symptoms. ~ Call provider if symptoms worsen or new symptoms develop. ~ Continue to follow treatment plan, including medications, until evaluated by provider. Cool/tepid showers or baths may help relieve itching. If cool water alone does not relieve itching, try adding 1/2 to 1 cup baking soda or colloidal oatmeal (Aveeno) to bath water. ~ ~ Call provider immediately if symptoms worsen before seeing provider. ~ SYMPTOM / CONDITION MANAGEMENT ~ List, or take, all current prescription(s), nonprescription or alternative medication(s) to provider for evaluation. 03/15/2011 10:04:45AM Page 1 of 1 [id number]

## 2011-03-18 NOTE — Assessment & Plan Note (Signed)
Will start a beta blocker

## 2011-03-25 ENCOUNTER — Encounter (INDEPENDENT_AMBULATORY_CARE_PROVIDER_SITE_OTHER): Payer: Self-pay | Admitting: Surgery

## 2011-03-25 ENCOUNTER — Ambulatory Visit (INDEPENDENT_AMBULATORY_CARE_PROVIDER_SITE_OTHER): Payer: Medicare Other | Admitting: Surgery

## 2011-03-25 DIAGNOSIS — K5720 Diverticulitis of large intestine with perforation and abscess without bleeding: Secondary | ICD-10-CM | POA: Insufficient documentation

## 2011-03-25 DIAGNOSIS — K5732 Diverticulitis of large intestine without perforation or abscess without bleeding: Secondary | ICD-10-CM

## 2011-03-25 NOTE — Progress Notes (Signed)
Subjective:     Patient ID: Cline Cools, female   DOB: 11-09-1931, 77 y.o.   MRN: [id number]    There were no vitals taken for this visit.    HPI  Diagnosis:  diverticulitis with perforation procedure performed is exploratory laparotomy mobilization of splenic flexure a left hemicolectomy with end transverse colostomy on 526 2012  Reason for visit is: followup  Ms. Rathel comes a feeling better. He keeps telling me "U-shaped my life". Her daughter is very involved her care. Her incision has no more drainage. Staples out and removed. Her ostomy appliances changed to 1-2 times a week. That way for ostomy appliance was reinforced and the skin irritated. It did not cause a maceration breakdown.  She had BM twice a day. Her appetite is okay. Her weight is down to 105 at that G. the patient and her daughter claims she's pretty stable. Patient has a lot of anxiety issues but started outside more.  Review of Systems  Respiratory: Negative for choking and shortness of breath.   Gastrointestinal: Negative for nausea, vomiting, abdominal pain, diarrhea, constipation and abdominal distention.  Musculoskeletal: Positive for back pain and arthralgias.  All other systems reviewed and are negative.       Objective:   Physical Exam  Constitutional: She appears well-developed and well-nourished.  Pulmonary/Chest: Effort normal. No respiratory distress.  Abdominal: Soft. There is no tenderness. There is no rebound and no guarding.       Incision C/D/I.  Staples removed.  Musculoskeletal:       In wheelchair  Skin: Skin is warm and dry.  Psychiatric:       Anxious but consolable       Assessment:     Perforated diverticulosis sigmoid colon the colon with prolonged hospital stay and colostomy rehabilitation. Slowly improving.    Plan:     -Completion colonoscopy. The last documented 2006 by Dr. [facility].  Scope the rectal pouch and through the colostomy to show no other lesions or  abnormalities  -Cardiac clearance. She survived the emergency colectomy, so she should tolerate colostomy takedown.  Completeness I think it should be done. Will see if Dr. Herbie [facility] can do this.  -Colostomy takedown. I would wait a minimum of 3 months. She seems deconditioned but for only a month out and rather sick she slowly improving. Technique of colostomy takedown discussed. Risks/benefits/alternatives discussed. She wants to ostomy taken down. Should be able to do laparoscopically. The questions answered and she agrees to proceed.  Plan for in  September.  Return to clinic next month to see if improving and discuss surgery.

## 2011-04-03 ENCOUNTER — Encounter: Payer: Self-pay | Admitting: Internal Medicine

## 2011-04-07 ENCOUNTER — Encounter (INDEPENDENT_AMBULATORY_CARE_PROVIDER_SITE_OTHER): Payer: Self-pay | Admitting: Surgery

## 2011-04-07 ENCOUNTER — Ambulatory Visit (INDEPENDENT_AMBULATORY_CARE_PROVIDER_SITE_OTHER): Payer: Medicare Other | Admitting: Surgery

## 2011-04-07 DIAGNOSIS — K5732 Diverticulitis of large intestine without perforation or abscess without bleeding: Secondary | ICD-10-CM

## 2011-04-07 DIAGNOSIS — K5720 Diverticulitis of large intestine with perforation and abscess without bleeding: Secondary | ICD-10-CM

## 2011-04-07 NOTE — Progress Notes (Signed)
Subjective:     Patient ID: Victoria [name], female   DOB: Jul 24, 1932, 77 y.o.   MRN: [id number]    There were no vitals taken for this visit.    HPI  Diagnosis perforated diverticulitis  Procedure laparotomy colectomy colostomy May 2012  Reason for Visit: Followup  Victoria [name] comes the feeling better. She is eating "like a big". No fever chills with nausea vomiting. She's into the bag 2-3 times a day. She is walking with a cane. She came with her  family is very supportive. She denies pain.  She is due to get cardiology to see her later this month. She is due for colonoscopy this month. The patient still very motivated to have her colostomy taken down.  Review of Systems  Constitutional: Positive for appetite change. Negative for fever, chills, fatigue and unexpected weight change.  Gastrointestinal: Negative for nausea, abdominal pain, diarrhea, constipation and blood in stool.  Musculoskeletal: Negative for arthralgias.  Psychiatric/Behavioral: Negative for hallucinations and dysphoric mood. The patient is nervous/anxious.   All other systems reviewed and are negative.       Objective:   Physical Exam  Constitutional: She appears well-developed and well-nourished. No distress.  HENT:  Head: Normocephalic.  Eyes: Pupils are equal, round, and reactive to light.  Cardiovascular: Normal rate and intact distal pulses.   Pulmonary/Chest: Effort normal. No respiratory distress.  Abdominal: Soft. She exhibits no distension. There is no tenderness. There is no rebound.       Incision well healed  Neurological: She is alert. No cranial nerve deficit. Coordination normal.  Skin: Skin is warm and dry. She is not diaphoretic. No erythema.  Psychiatric:       Mild perseveration in questions.  Not agitated       Assessment:     Perforated diverticulitis status post emergency colectomy/colostomy. Slowly improving.    Plan:     She is now 7 weeks postop. She's making improvements.  I'm open to the idea of colostomy takedown her. However, I want cardiac clearance. I am happy they are working get that done. I also agree with colonoscopy since it's been more than 3 years from her last one.  I did discuss colostomy takedown. I think it reasonable try laparoscopically. I would wait at least 3 months post-op, push it back to September or October. The patient is very motivated to have this taken down.   I recommend they call me after they have cardiology and GI see her to make sure they are okay with proceeding. Thank you

## 2011-04-07 NOTE — Patient Instructions (Signed)
Call after seeing cardiology & getting colonoscopy to see if safe to perform colostomy takedown

## 2011-04-08 ENCOUNTER — Encounter (HOSPITAL_BASED_OUTPATIENT_CLINIC_OR_DEPARTMENT_OTHER): Payer: Medicare Other | Admitting: Oncology

## 2011-04-08 ENCOUNTER — Other Ambulatory Visit: Payer: Self-pay | Admitting: Oncology

## 2011-04-08 DIAGNOSIS — K5732 Diverticulitis of large intestine without perforation or abscess without bleeding: Secondary | ICD-10-CM

## 2011-04-08 DIAGNOSIS — I471 Supraventricular tachycardia: Secondary | ICD-10-CM

## 2011-04-08 DIAGNOSIS — K659 Peritonitis, unspecified: Secondary | ICD-10-CM

## 2011-04-08 DIAGNOSIS — C9000 Multiple myeloma not having achieved remission: Secondary | ICD-10-CM

## 2011-04-08 LAB — CBC WITH DIFFERENTIAL/PLATELET
Basophils Absolute: 0.1 10*3/uL (ref 0.0–0.1)
EOS%: 1.1 % (ref 0.0–7.0)
Eosinophils Absolute: 0 10*3/uL (ref 0.0–0.5)
HGB: 10.5 g/dL — ABNORMAL LOW (ref 11.6–15.9)
LYMPH%: 28.9 % (ref 14.0–49.7)
MCH: 29.9 pg (ref 25.1–34.0)
MCV: 91.7 fL (ref 79.5–101.0)
MONO%: 6.4 % (ref 0.0–14.0)
NEUT#: 2.1 10*3/uL (ref 1.5–6.5)
NEUT%: 61.9 % (ref 38.4–76.8)
Platelets: 243 10*3/uL (ref 145–400)
RDW: 20.5 % — ABNORMAL HIGH (ref 11.2–14.5)

## 2011-04-08 LAB — COMPREHENSIVE METABOLIC PANEL
AST: 19 U/L (ref 0–37)
Alkaline Phosphatase: 72 U/L (ref 39–117)
BUN: 15 mg/dL (ref 6–23)
Creatinine, Ser: 0.64 mg/dL (ref 0.50–1.10)
Glucose, Bld: 98 mg/dL (ref 70–99)
Potassium: 4.2 mEq/L (ref 3.5–5.3)
Total Bilirubin: 0.8 mg/dL (ref 0.3–1.2)

## 2011-04-08 NOTE — Discharge Summary (Signed)
[name], [name] NO.:  [id number]  MEDICAL RECORD NO.:  [id number]  LOCATION:  [id number]                         FACILITY:  [facility]  PHYSICIAN:  Anselm Pancoast. Julus Kelley, M.D.DATE OF BIRTH:  October 26, 1931  DATE OF ADMISSION:  02/21/2011 DATE OF DISCHARGE:                              DISCHARGE SUMMARY   ADMITTING PHYSICIAN:  Hartley Barefoot, MD.  PRIMARY CARE PHYSICIAN:  [facility]. Plotnikov, MD.  CONSULTING CARDIOLOGIST:  [facility], Dr. Rennis Golden.  CONSULTING SURGEON:  Karie Soda, MD  HISTORY OF PRESENT ILLNESS:  Victoria [name] presented on Feb 21, 2011, with several days' complaint of abdominal pain and distention accompanied by nausea and vomiting.  Victoria [name] was seen in Victoria [name] and found to have abnormal workup including elevated white blood cell count and an x-ray showing diffuse small bowel distention concerning for high-grade small bowel obstruction.  There was air and stool seen within Victoria colon.  Victoria [name] also had evidence of supraventricular tachycardia.  After evaluation was completed by Victoria hospitalist service, Victoria decision was made to admit Victoria [name] to Victoria [name].  [facility] was consulted for management of supraventricular tachycardia.  Victoria [name] was placed on IV Cardizem.  This was felt likely secondary to Victoria [name] acute intra- abdominal process.  SUMMARY OF HOSPITAL COURSE:  After admission on Feb 21, 2011, Dr. Jamey Ripa was asked to evaluate Victoria [name] for bowel obstruction.  A CT scan was subsequently ordered and follow up of Victoria plain films which showed significant findings of bowel obstruction.  However, there was evidence of free air and left-sided inflammatory process, possibly showing evidence of an abscess.  There was significant diverticulosis along Victoria entirety of Victoria colon.  Therefore, after Dr. Michaell Cowing reviewed Victoria report, he had a long discussion with Victoria [name] and  Victoria [name] family regarding Victoria need for surgical intervention for Victoria acute abdomen.  Victoria [name] was taken to Victoria operating room on Feb 22, 2011, and  underwent exploratory laparotomy, abdominal washout of peritonitis, mobilization of Victoria splenic flexure and a left hemicolectomy with end transverse colostomy. Postoperatively Victoria [name] was sent to Victoria ICU where Victoria [name] had a relatively typical stay.  Victoria [name]  supraventricular tachycardia maintained stability on IV amiodarone with breakthrough IV Cardizem.  Victoria [name] developed some postoperative acute blood loss anemia and an anemia panel showed significant iron deficiency anemia as well.  Victoria [name] had a significant postop ileus that gradually improved.  Victoria [name] electrolytes were corrected.  Victoria [name] NG tube was eventually discontinued and Victoria [name] was started on a liquid diet once output started from Victoria [name] colostomy.  Victoria [name] was eventually converted from IV amiodarone to p.o. amiodarone and Victoria [name] began tolerating more diet.  Physical Therapy and Occupational Therapy were instituted due to deconditioned state; however, they felt Victoria [name] was okay for discharge home as long as Victoria [name] had home health care and therapy.  Victoria [name] diet was advanced to a regular diet.  As Victoria [name] ostomy function has been very stable, Victoria [name] has been converted to p.o. pain medication which Victoria [name] has tolerated well.  Victoria [name] antibiotics have been discontinued.  Outgoing labs showed a white blood cell count of 6.1,  hemoglobin 7.5, hematocrit of 23.4, platelet count 553.  DISCHARGE DIAGNOSES: 1. Perforated sigmoid diverticulitis with associated small-bowel     obstruction/ileus secondary to inflammatory process status post     left hemicolectomy with end colostomy. 2. Paroxysmal supraventricular tachycardia - stable on p.o.     amiodarone. 3. Hypokalemia - repleted. 4. Hypertension - stable on Norvasc. 5. Iron-deficiency anemia.  Victoria [name] to be placed on oral     supplements.  DISCHARGE MEDICATIONS:  Include: 1.  Norvasc 5 mg 1 tablet daily. 2. Lorazepam 1 mg 1 tablet twice daily as needed. 3. Fish oil daily. 4. Multivitamin daily. 5. Probiotic over-Victoria-counter daily. 6. Amiodarone 200 mg 1 tablet twice daily. 7. Ferrous sulfate 325 mg three times daily. 8. Vicodin 5/500 one to two tablets q.6 h p.r.n. severe pain.  PLAN:  Victoria [name] will have Home Health Nursing and Physical Therapy and Occupational Therapy arranged for Victoria [name] to continue at home.  Victoria [name] has been taught ostomy teaching by Victoria wound and ostomy care nurse.  Victoria [name] is given instructions regarding wound care to keep this area clean with soap and water.  Victoria [name] has a followup appointment scheduled with Dr. Avel Peace at [facility] on March 10, 2011, at 2:10 p.m. Victoria [name] can contact our office at [phone] for any questions or concerns prior to then.  Also recommend Victoria [name] follow up with primary care physician Dr. Posey Rea as well as Dr. Rennis Golden of [facility] over Victoria next couple weeks.  Victoria [name] can contact our office with any questions or concerns prior to then.     Brayton El, PA-C   ______________________________ Anselm Pancoast. Zachery Dakins, M.D.KB/MEDQ  D:  03/05/2011  T:  03/05/2011  Job:  [id number]  cc:   Georgina Quint. Plotnikov, MD [address] [place] [address]  Italy Hilty, MD  Electronically Signed by Brayton El  on 03/24/2011 02:34:41 PM Electronically Signed by Consuello Bossier M.D. on 04/08/2011 03:46:09 PM

## 2011-04-09 ENCOUNTER — Ambulatory Visit (AMBULATORY_SURGERY_CENTER): Payer: Medicare Other | Admitting: *Deleted

## 2011-04-09 VITALS — Ht 62.5 in | Wt 109.0 lb

## 2011-04-09 DIAGNOSIS — Z1211 Encounter for screening for malignant neoplasm of colon: Secondary | ICD-10-CM

## 2011-04-09 MED ORDER — PEG-KCL-NACL-NASULF-NA ASC-C 100 G PO SOLR: ORAL | Status: DC

## 2011-04-09 NOTE — Progress Notes (Signed)
Ms. Satchell has a Stoma from surgery for a perforated bowel and is having this colon prior to being reconnected.

## 2011-04-10 LAB — IMMUNOFIXATION ELECTROPHORESIS
IgA: 392 mg/dL — ABNORMAL HIGH (ref 68–380)
IgG (Immunoglobin G), Serum: 1850 mg/dL — ABNORMAL HIGH (ref 690–1700)
IgM, Serum: 64 mg/dL (ref 52–322)

## 2011-04-10 LAB — KAPPA/LAMBDA LIGHT CHAINS
Kappa free light chain: 2.86 mg/dL — ABNORMAL HIGH (ref 0.33–1.94)
Lambda Free Lght Chn: 19 mg/dL — ABNORMAL HIGH (ref 0.57–2.63)

## 2011-04-15 ENCOUNTER — Ambulatory Visit: Payer: Medicare Other | Admitting: Internal Medicine

## 2011-04-17 ENCOUNTER — Telehealth: Payer: Self-pay | Admitting: Internal Medicine

## 2011-04-17 NOTE — Telephone Encounter (Signed)
Patient's daughter wants to be sure Dr. Juanda Chance knows that the patient had a perforation of the bowel and she has a left stoma. The colonoscopy on 04/22/11 is for the surgeon to determine if she can be reattached.

## 2011-04-21 NOTE — Telephone Encounter (Signed)
Thank You for letting me know

## 2011-04-22 ENCOUNTER — Encounter: Payer: Self-pay | Admitting: Internal Medicine

## 2011-04-22 ENCOUNTER — Ambulatory Visit (AMBULATORY_SURGERY_CENTER): Payer: Medicare Other | Admitting: Internal Medicine

## 2011-04-22 VITALS — BP 184/88 | HR 80 | Temp 98.2°F | Resp 17 | Ht 62.5 in | Wt 106.0 lb

## 2011-04-22 DIAGNOSIS — D126 Benign neoplasm of colon, unspecified: Secondary | ICD-10-CM

## 2011-04-22 DIAGNOSIS — K5730 Diverticulosis of large intestine without perforation or abscess without bleeding: Secondary | ICD-10-CM

## 2011-04-22 DIAGNOSIS — D128 Benign neoplasm of rectum: Secondary | ICD-10-CM

## 2011-04-22 DIAGNOSIS — Z1211 Encounter for screening for malignant neoplasm of colon: Secondary | ICD-10-CM

## 2011-04-22 DIAGNOSIS — D129 Benign neoplasm of anus and anal canal: Secondary | ICD-10-CM

## 2011-04-22 DIAGNOSIS — Z01818 Encounter for other preprocedural examination: Secondary | ICD-10-CM

## 2011-04-22 MED ORDER — SODIUM CHLORIDE 0.9 % IV SOLN: 500.0000 mL | INTRAVENOUS | Status: DC

## 2011-04-22 NOTE — Patient Instructions (Signed)
Please read the handouts given to you by your recovery room nurse.  Your biopsy results will be mailed to you within 2 weeks.   Dr. Juanda Chance would like for you to increase the fiber in your diet.   She also said that it would probably be possible to reverse her colostomy.   Resume your routine medications today.  If you have any questions, please call us at [phone].  Thank-you.

## 2011-04-23 ENCOUNTER — Telehealth: Payer: Self-pay

## 2011-04-23 NOTE — Telephone Encounter (Signed)

## 2011-04-24 ENCOUNTER — Telehealth (INDEPENDENT_AMBULATORY_CARE_PROVIDER_SITE_OTHER): Payer: Self-pay

## 2011-04-24 NOTE — Telephone Encounter (Signed)
Returned pt's voicemail message about getting pt scheduled for surgery. I advised the pt's daughter that I have received clearance from her cardiologist but he really wants pt to get clearance from Dr Posey Rea b/c of her blood pressure. I told the daughter I would call her as soon as pt gets this last clearance. The pt has an appt with Dr Posey Rea on 05-06-11./ [facility]

## 2011-04-28 ENCOUNTER — Encounter: Payer: Self-pay | Admitting: Internal Medicine

## 2011-05-01 ENCOUNTER — Other Ambulatory Visit: Payer: Medicare Other | Admitting: Internal Medicine

## 2011-05-06 ENCOUNTER — Encounter: Payer: Self-pay | Admitting: Internal Medicine

## 2011-05-06 ENCOUNTER — Ambulatory Visit (INDEPENDENT_AMBULATORY_CARE_PROVIDER_SITE_OTHER): Payer: Medicare Other | Admitting: Internal Medicine

## 2011-05-06 DIAGNOSIS — Z9889 Other specified postprocedural states: Secondary | ICD-10-CM

## 2011-05-06 DIAGNOSIS — Z9049 Acquired absence of other specified parts of digestive tract: Secondary | ICD-10-CM

## 2011-05-06 DIAGNOSIS — R000 Tachycardia, unspecified: Secondary | ICD-10-CM

## 2011-05-06 DIAGNOSIS — I10 Essential (primary) hypertension: Secondary | ICD-10-CM

## 2011-05-06 NOTE — Assessment & Plan Note (Signed)
On atenolol - no dose change BP rechecked 160/80

## 2011-05-06 NOTE — Assessment & Plan Note (Signed)
Better  

## 2011-05-06 NOTE — Progress Notes (Signed)
  Subjective:    Patient ID: Victoria [name], female    DOB: [birth year], 77 y.o.   MRN: [id number]  HPI F/u HTN - high BP in Dr's offices; nl BP at home   Review of Systems  Constitutional: Positive for fatigue. Negative for fever.  Cardiovascular: Negative for chest pain.  Gastrointestinal: Negative for nausea, vomiting and blood in stool.  Genitourinary: Negative for dysuria and flank pain.  Musculoskeletal: Negative for back pain.  Neurological: Positive for dizziness. Negative for tremors and light-headedness.  Psychiatric/Behavioral: Negative for agitation. The patient is nervous/anxious.        Objective:   Physical Exam  Constitutional: She appears well-developed. No distress.       NAD  HENT:  Head: Normocephalic.  Right Ear: External ear normal.  Left Ear: External ear normal.  Nose: Nose normal.  Mouth/Throat: Oropharynx is clear and moist.  Eyes: Conjunctivae are normal. Pupils are equal, round, and reactive to light. Right eye exhibits no discharge. Left eye exhibits no discharge.  Neck: Normal range of motion. Neck supple. No JVD present. No tracheal deviation present. No thyromegaly present.  Cardiovascular: Normal rate, regular rhythm and normal heart sounds.   Pulmonary/Chest: No stridor. No respiratory distress. She has no wheezes.  Abdominal: Soft. Bowel sounds are normal. She exhibits no distension and no mass. There is no tenderness. There is no rebound and no guarding.       L pouch scar  Musculoskeletal: She exhibits no edema and no tenderness.  Lymphadenopathy:    She has no cervical adenopathy.  Neurological: She displays normal reflexes. No cranial nerve deficit. She exhibits normal muscle tone. Coordination (slow) abnormal.  Skin: No rash noted. No erythema.  Psychiatric: She has a normal mood and affect. Her behavior is normal. Judgment and thought content normal.          Assessment & Plan:

## 2011-05-07 ENCOUNTER — Encounter: Payer: Self-pay | Admitting: Internal Medicine

## 2011-05-07 ENCOUNTER — Other Ambulatory Visit: Payer: Self-pay | Admitting: Internal Medicine

## 2011-05-07 ENCOUNTER — Telehealth: Payer: Self-pay | Admitting: *Deleted

## 2011-05-07 NOTE — Telephone Encounter (Signed)
Pls write a letter Thx 

## 2011-05-07 NOTE — Telephone Encounter (Signed)
Patient's daughter called - she is req MD call patient OR a letter from MD stating that patient should try NOT to use her walker and/or cane.

## 2011-05-08 ENCOUNTER — Telehealth (INDEPENDENT_AMBULATORY_CARE_PROVIDER_SITE_OTHER): Payer: Self-pay

## 2011-05-08 ENCOUNTER — Telehealth: Payer: Self-pay | Admitting: *Deleted

## 2011-05-08 NOTE — Telephone Encounter (Signed)
Letter mailed

## 2011-05-08 NOTE — Telephone Encounter (Signed)
It is ok - she has a white coat htn x years. Pls write a lette and  Mail w/my last note. Thx

## 2011-05-08 NOTE — Telephone Encounter (Signed)
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 She is going to be scheduled for colostomy take down surgery but they must have clearance letter from MD b/c BP has been elevated.

## 2011-05-08 NOTE — Telephone Encounter (Signed)
Letter printed/pending MD's signature.

## 2011-05-08 NOTE — Telephone Encounter (Signed)
Returned [employer] but had to [facility] for her to call me so we could discuss surgery she is trying to schedule for the pt. I also called Dr Plotkinov's office to see if clearance could be faxed over to [place] but they are having to leave a message for Dr Gayla Medicus write clearance  Note.Hulda Humphrey

## 2011-05-09 ENCOUNTER — Other Ambulatory Visit: Payer: Self-pay | Admitting: *Deleted

## 2011-05-09 ENCOUNTER — Other Ambulatory Visit: Payer: Medicare Other | Admitting: Internal Medicine

## 2011-05-09 ENCOUNTER — Telehealth (INDEPENDENT_AMBULATORY_CARE_PROVIDER_SITE_OTHER): Payer: Self-pay

## 2011-05-09 MED ORDER — LORAZEPAM 1 MG PO TABS: 1.0000 mg | ORAL_TABLET | Freq: Two times a day (BID) | ORAL | Status: DC | PRN

## 2011-05-09 NOTE — Telephone Encounter (Signed)
LMOM of Dr Plotnikov's nurse checking on the status of medical clearance. The pt's daughter called checking on the clearance to see if we had received it yet b/c they were told we would get it sent to us./ [facility]

## 2011-05-12 NOTE — Telephone Encounter (Signed)
Letter faxed with 05-06-11 OV note to Dr. Michaell Cowing at [phone]

## 2011-05-13 ENCOUNTER — Telehealth (INDEPENDENT_AMBULATORY_CARE_PROVIDER_SITE_OTHER): Payer: Self-pay

## 2011-05-13 NOTE — Telephone Encounter (Signed)
Called pt's daughter to notify her that we received clearance on the pt and we can schedule surgery now. I told her that I would take her mother's info to our surgery schedulers. I will also mail miralax prep to the pt./ [facility]

## 2011-05-15 ENCOUNTER — Telehealth (INDEPENDENT_AMBULATORY_CARE_PROVIDER_SITE_OTHER): Payer: Self-pay

## 2011-05-15 NOTE — Telephone Encounter (Signed)
Returned pt's daughter's voicemail message about pt not liking gatorade and we gave pt the miralax gatorade prep to do before her colostomy takedown surgery. Per Dr Michaell Cowing pt may have crystal light in water but nothing with red dyes./ [facility]

## 2011-05-26 ENCOUNTER — Telehealth: Payer: Self-pay | Admitting: *Deleted

## 2011-05-26 NOTE — Telephone Encounter (Signed)
Needs RF of ativan. Our records show RX was called in w/3 RFs on 8/10. Pharm did not get RFs, called in #60 w/2 RFs & left mess for pateint

## 2011-06-20 ENCOUNTER — Other Ambulatory Visit (INDEPENDENT_AMBULATORY_CARE_PROVIDER_SITE_OTHER): Payer: Self-pay | Admitting: Surgery

## 2011-06-20 ENCOUNTER — Ambulatory Visit (HOSPITAL_COMMUNITY)
Admission: RE | Admit: 2011-06-20 | Discharge: 2011-06-20 | Disposition: A | Discharge status: Home / Self Care | Payer: Medicare Other | Source: Ambulatory Visit | Attending: Surgery | Admitting: Surgery

## 2011-06-20 ENCOUNTER — Encounter (HOSPITAL_COMMUNITY): Payer: Medicare Other

## 2011-06-20 DIAGNOSIS — K5732 Diverticulitis of large intestine without perforation or abscess without bleeding: Secondary | ICD-10-CM

## 2011-06-20 DIAGNOSIS — Z01812 Encounter for preprocedural laboratory examination: Secondary | ICD-10-CM | POA: Insufficient documentation

## 2011-06-20 DIAGNOSIS — Z01818 Encounter for other preprocedural examination: Secondary | ICD-10-CM | POA: Insufficient documentation

## 2011-06-20 LAB — BASIC METABOLIC PANEL
BUN: 22 mg/dL (ref 6–23)
GFR calc Af Amer: >60 mL/min (ref >60)
GFR calc non Af Amer: 53 mL/min — ABNORMAL LOW (ref >60)
Potassium: 4.3 mEq/L (ref 3.5–5.1)

## 2011-06-20 LAB — TISSUE HYBRIDIZATION (BONE MARROW)-NCBH

## 2011-06-20 LAB — DIFFERENTIAL
Eosinophils Absolute: 0
Lymphs Abs: 0.8
Monocytes Relative: 6
Neutro Abs: 4.1
Neutrophils Relative %: 78 — ABNORMAL HIGH

## 2011-06-20 LAB — CBC
HCT: 34.6 % — ABNORMAL LOW (ref 36.0–46.0)
Hemoglobin: 11 — ABNORMAL LOW
MCHC: 32.1 g/dL (ref 30.0–36.0)
MCV: 88.9
Platelets: 223 10*3/uL (ref 150–400)
RBC: 3.84 — ABNORMAL LOW
RDW: 14.4 % (ref 11.5–15.5)
WBC: 5.2

## 2011-06-20 LAB — CHROMOSOME ANALYSIS, BONE MARROW

## 2011-06-20 LAB — BONE MARROW EXAM

## 2011-06-24 ENCOUNTER — Telehealth: Payer: Self-pay | Admitting: *Deleted

## 2011-06-24 MED ORDER — CARVEDILOL 12.5 MG PO TABS: 12.5000 mg | ORAL_TABLET | Freq: Two times a day (BID) | ORAL | Status: DC

## 2011-06-24 NOTE — Telephone Encounter (Signed)
Left detailed VM for daughter

## 2011-06-24 NOTE — Telephone Encounter (Signed)
Stop it Start Coreg Thx

## 2011-06-24 NOTE — Telephone Encounter (Signed)
Daughter called, pt's hair is falling out in clumps since starting atenolol. She is scheduled for surgery on the 28 th but would like to change med after surgery. Please advise.

## 2011-06-27 ENCOUNTER — Other Ambulatory Visit (INDEPENDENT_AMBULATORY_CARE_PROVIDER_SITE_OTHER): Payer: Self-pay | Admitting: Surgery

## 2011-06-27 ENCOUNTER — Inpatient Hospital Stay (HOSPITAL_COMMUNITY)
Admission: RE | Admit: 2011-06-27 | Discharge: 2011-06-30 | DRG: 336 | Disposition: A | Discharge status: Home Health | Payer: Medicare Other | Source: Ambulatory Visit | Attending: Surgery | Admitting: Surgery

## 2011-06-27 DIAGNOSIS — I4891 Unspecified atrial fibrillation: Secondary | ICD-10-CM | POA: Diagnosis present

## 2011-06-27 DIAGNOSIS — Z9049 Acquired absence of other specified parts of digestive tract: Secondary | ICD-10-CM

## 2011-06-27 DIAGNOSIS — Z433 Encounter for attention to colostomy: Secondary | ICD-10-CM

## 2011-06-27 DIAGNOSIS — Z01812 Encounter for preprocedural laboratory examination: Secondary | ICD-10-CM

## 2011-06-27 DIAGNOSIS — I4892 Unspecified atrial flutter: Secondary | ICD-10-CM | POA: Diagnosis present

## 2011-06-27 DIAGNOSIS — K660 Peritoneal adhesions (postprocedural) (postinfection): Secondary | ICD-10-CM | POA: Diagnosis present

## 2011-06-27 DIAGNOSIS — R42 Dizziness and giddiness: Secondary | ICD-10-CM | POA: Diagnosis present

## 2011-06-27 DIAGNOSIS — R279 Unspecified lack of coordination: Secondary | ICD-10-CM | POA: Diagnosis present

## 2011-06-27 DIAGNOSIS — H9190 Unspecified hearing loss, unspecified ear: Secondary | ICD-10-CM | POA: Diagnosis present

## 2011-06-27 DIAGNOSIS — F411 Generalized anxiety disorder: Secondary | ICD-10-CM | POA: Diagnosis present

## 2011-06-27 DIAGNOSIS — I10 Essential (primary) hypertension: Secondary | ICD-10-CM | POA: Diagnosis present

## 2011-06-27 DIAGNOSIS — H04129 Dry eye syndrome of unspecified lacrimal gland: Secondary | ICD-10-CM | POA: Diagnosis present

## 2011-06-27 DIAGNOSIS — K5730 Diverticulosis of large intestine without perforation or abscess without bleeding: Secondary | ICD-10-CM | POA: Diagnosis present

## 2011-06-27 DIAGNOSIS — F068 Other specified mental disorders due to known physiological condition: Secondary | ICD-10-CM | POA: Diagnosis present

## 2011-06-27 DIAGNOSIS — C9000 Multiple myeloma not having achieved remission: Secondary | ICD-10-CM | POA: Diagnosis present

## 2011-06-27 HISTORY — PX: COLOSTOMY TAKEDOWN: SHX5258

## 2011-06-27 LAB — TYPE AND SCREEN: ABO/RH(D): O POS

## 2011-06-28 DIAGNOSIS — E876 Hypokalemia: Secondary | ICD-10-CM

## 2011-06-28 LAB — CBC
Hemoglobin: 10 g/dL — ABNORMAL LOW (ref 12.0–15.0)
MCH: 30 pg (ref 26.0–34.0)
MCV: 93.7 fL (ref 78.0–100.0)
RBC: 3.33 MIL/uL — ABNORMAL LOW (ref 3.87–5.11)

## 2011-06-28 LAB — BASIC METABOLIC PANEL
CO2: 24 mEq/L (ref 19–32)
Calcium: 8.3 mg/dL — ABNORMAL LOW (ref 8.4–10.5)
Creatinine, Ser: 0.92 mg/dL (ref 0.50–1.10)
Glucose, Bld: 76 mg/dL (ref 70–99)

## 2011-06-29 LAB — MAGNESIUM: Magnesium: 1.9 mg/dL (ref 1.5–2.5)

## 2011-06-29 LAB — POTASSIUM: Potassium: 3.3 mEq/L — ABNORMAL LOW (ref 3.5–5.1)

## 2011-06-30 LAB — CREATININE, SERUM
Creatinine, Ser: 0.72 mg/dL (ref 0.50–1.10)
GFR calc non Af Amer: 81 mL/min — ABNORMAL LOW (ref >90)

## 2011-06-30 LAB — HEMOGLOBIN: Hemoglobin: 10.8 g/dL — ABNORMAL LOW (ref 12.0–15.0)

## 2011-07-07 ENCOUNTER — Encounter (INDEPENDENT_AMBULATORY_CARE_PROVIDER_SITE_OTHER): Payer: Self-pay | Admitting: Surgery

## 2011-07-07 ENCOUNTER — Ambulatory Visit (INDEPENDENT_AMBULATORY_CARE_PROVIDER_SITE_OTHER): Payer: Medicare Other | Admitting: Surgery

## 2011-07-07 VITALS — BP 136/88 | HR 64 | Temp 99.2°F | Resp 16 | Ht 62.5 in | Wt 109.4 lb

## 2011-07-07 DIAGNOSIS — K5720 Diverticulitis of large intestine with perforation and abscess without bleeding: Secondary | ICD-10-CM

## 2011-07-07 DIAGNOSIS — K5732 Diverticulitis of large intestine without perforation or abscess without bleeding: Secondary | ICD-10-CM

## 2011-07-07 MED ORDER — TRAMADOL HCL 50 MG PO TABS: 50.0000 mg | ORAL_TABLET | Freq: Four times a day (QID) | ORAL | Status: DC | PRN

## 2011-07-07 NOTE — Progress Notes (Signed)
Subjective:     Patient ID: Victoria [name], female   DOB: 1932-08-19, 77 y.o.   MRN: [id number]  HPI  Patient Care Team: Sonda Primes, MD as PCP - General Levert Feinstein, MD (Hematology and Oncology) Hart Carwin, MD as Consulting Physician (Gastroenterology) Marykay Lex as Consulting Physician (Cardiology)  This patient is a 77 y.o.female who presents today for surgical evaluation.   Reason for visit: Follow up on laparoscopic lysis of adhesions and colostomy takedown 06/27/2011  The patient comes today feeling well. She is lost a few pounds. Appetite coming back. Her daughter is with her. Her car notes that she has to nudge the patient to walk little bit. Some lower abdominal and mid thoracic back pain as well. Tylenol seems to not be used. Tramadol helps. The patient wants more. Regular bowel movements. No nausea or vomiting. No fevers no chills no sweats. Energy level coming back. She is walking with a cane.  Past Medical History  Diagnosis Date  . Hypertension   . Insomnia   . Anxiety   . Hearing loss     bilateral hearing aids  . Anemia   . Multiple myeloma   . Multiple myeloma     Past Surgical History  Procedure Date  . Colostomy 2012  . Colectomy 01/2011    perforation/stoma  . Colonoscopy   . Partial hysterectomy   . Cesarean section   . Colostomy takedown 06/27/11    History   Social History  . Marital Status: Divorced    Spouse Name: N/A    Number of Children: N/A  . Years of Education: N/A   Occupational History  . Not on file.   Social History Main Topics  . Smoking status: Never Smoker   . Smokeless tobacco: Never Used  . Alcohol Use: No  . Drug Use: No  . Sexually Active: No   Other Topics Concern  . Not on file   Social History Narrative  . No narrative on file    Family History  Problem Relation Age of Onset  . Hypertension Other   . Cancer Father     prostate  . Colitis Neg Hx   . Esophageal cancer Neg Hx   . Stomach  cancer Neg Hx     Current outpatient prescriptions:amiodarone (PACERONE) 200 MG tablet, TAKE 1 TABLET TWICE A DAY, Disp: 180 tablet, Rfl: 1;  amLODipine (NORVASC) 5 MG tablet, Take 1 tablet (5 mg total) by mouth daily., Disp: 90 tablet, Rfl: 1;  carvedilol (COREG) 12.5 MG tablet, Take 1 tablet (12.5 mg total) by mouth 2 (two) times daily., Disp: 60 tablet, Rfl: 11;  cholecalciferol (VITAMIN D) 1000 UNITS tablet, Take 1,000 Units by mouth daily.  , Disp: , Rfl:  hydrochlorothiazide 25 MG tablet, Take 25 mg by mouth daily.  , Disp: , Rfl: ;  Iron 25 MG TABS, Take 1 tablet by mouth 3 (three) times daily.  , Disp: , Rfl: ;  KRILL OIL PO, Take 250 mg by mouth daily.  , Disp: , Rfl: ;  LORazepam (ATIVAN) 1 MG tablet, Take 1 tablet (1 mg total) by mouth 2 (two) times daily as needed for anxiety., Disp: 60 tablet, Rfl: 3 Multiple Vitamins-Minerals (MULTIVITAMIN WITH MINERALS) tablet, Take 1 tablet by mouth daily.  , Disp: , Rfl: ;  traMADol (ULTRAM) 50 MG tablet, Take 1 tablet (50 mg total) by mouth every 6 (six) hours as needed for pain., Disp: 40 tablet, Rfl: 0 Current facility-administered  medications:0.9 %  sodium chloride infusion, 500 mL, Intravenous, Continuous, Hart Carwin, MD  Allergies  Allergen Reactions  . Amiodarone     rash  . Amoxicillin Hives and Itching  . Aspirin   . Atenolol     Hair loss       Review of Systems  Constitutional: Negative for fever, chills, diaphoresis, appetite change and fatigue.       6lb wght loss since OR   HENT: Negative for ear pain, sore throat, trouble swallowing, neck pain and ear discharge.   Eyes: Negative for photophobia, discharge and visual disturbance.  Respiratory: Negative for cough, choking, chest tightness and shortness of breath.   Cardiovascular: Negative for chest pain, palpitations and leg swelling.  Gastrointestinal: Negative for nausea, vomiting, abdominal pain, diarrhea, constipation, blood in stool, anal bleeding and rectal pain.    Genitourinary: Negative for dysuria, frequency and difficulty urinating.  Musculoskeletal: Positive for back pain. Negative for myalgias, joint swelling, arthralgias and gait problem.  Skin: Negative for color change, pallor and rash.  Neurological: Negative for dizziness, speech difficulty, weakness and numbness.  Hematological: Negative for adenopathy.  Psychiatric/Behavioral: Negative for confusion and agitation. The patient is not nervous/anxious.        Objective:   Physical Exam  Constitutional: She is oriented to person, place, and time. She appears well-developed and well-nourished. No distress.  HENT:  Head: Normocephalic.  Mouth/Throat: Oropharynx is clear and moist. No oropharyngeal exudate.  Eyes: Conjunctivae and EOM are normal. Pupils are equal, round, and reactive to light. No scleral icterus.  Neck: Normal range of motion. No tracheal deviation present.  Cardiovascular: Normal rate and intact distal pulses.   Pulmonary/Chest: Effort normal. No respiratory distress. She exhibits no tenderness.  Abdominal: Soft. She exhibits no distension and no mass. There is no tenderness. There is no rebound and no guarding. Hernia confirmed negative in the right inguinal area and confirmed negative in the left inguinal area.       Incisions clean with normal healing ridges.  No hernias  Genitourinary: No vaginal discharge found.  Musculoskeletal: Normal range of motion. She exhibits no tenderness.  Lymphadenopathy:       Right: No inguinal adenopathy present.       Left: No inguinal adenopathy present.  Neurological: She is alert and oriented to person, place, and time. No cranial nerve deficit. She exhibits normal muscle tone. Coordination normal.  Skin: Skin is warm and dry. No rash noted. She is not diaphoretic.  Psychiatric: She has a normal mood and affect. Her behavior is normal.       Assessment:     10 days postop from a laparoscopic lysis of adhesions and colostomy  takedown. Recovering rather well so far    Plan:     Increase activity as tolerated  Advanced as tolerated  Ice/Heat/Tylenol/tramadol/increased activity for her back pain and abdominal discomfort.  RTC 3 weeks.  Questions answered.  She gave me a hug & expressed understanding.

## 2011-07-07 NOTE — Patient Instructions (Signed)
Diverticulosis Diverticulosis is a common condition that develops when small pouches (diverticula) form in the wall of the colon. The risk of diverticulosis increases with age. It happens more often in people who eat a low-fiber diet. Most individuals with diverticulosis have no symptoms. Those individuals with symptoms usually experience belly (abdominal) pain, constipation, or loose stools (diarrhea). HOME CARE INSTRUCTIONS  Increase the amount of fiber in your diet as directed by your caregiver or dietician. This may reduce symptoms of diverticulosis.   Your caregiver may recommend taking a dietary fiber supplement.   Drink at least 6 to 8 glasses of water each day to prevent constipation.   Try not to strain when you have a bowel movement.   Your caregiver may recommend avoiding nuts and seeds to prevent complications, although this is still an uncertain benefit.   Only take over-the-counter or prescription medicines for pain, discomfort, or fever as directed by your caregiver.  FOODS HAVING HIGH FIBER CONTENT INCLUDE:  Fruits. Apple, peach, pear, tangerine, raisins, prunes.   Vegetables. Brussels sprouts, asparagus, broccoli, cabbage, carrot, cauliflower, romaine lettuce, spinach, summer squash, tomato, winter squash, zucchini.   Starchy Vegetables. Baked beans, kidney beans, lima beans, split peas, lentils, potatoes (with skin).   Grains. Whole wheat bread, brown rice, bran flake cereal, plain oatmeal, white rice, shredded wheat, bran muffins.  SEEK IMMEDIATE MEDICAL CARE IF:  You develop increasing pain or severe bloating.   You have an oral temperature above 102, not controlled by medicine.   You develop vomiting or bowel movements that are bloody or black.  Document Released: 06/12/2004 Document Re-Released: 03/05/2010 [facility] Patient Information 2011 [place], [place].

## 2011-07-09 LAB — CBC
MCHC: 32.8
MCV: 90.6
Platelets: 259

## 2011-07-09 LAB — CHROMOSOME ANALYSIS, BONE MARROW

## 2011-07-10 ENCOUNTER — Telehealth (INDEPENDENT_AMBULATORY_CARE_PROVIDER_SITE_OTHER): Payer: Self-pay | Admitting: General Surgery

## 2011-07-10 LAB — POCT I-STAT CREATININE: Creatinine, Ser: 1

## 2011-07-10 LAB — URINALYSIS, ROUTINE W REFLEX MICROSCOPIC
Bilirubin Urine: NEGATIVE
Hgb urine dipstick: NEGATIVE
Ketones, ur: NEGATIVE
Nitrite: NEGATIVE
Protein, ur: NEGATIVE
Specific Gravity, Urine: 1.015
Specific Gravity, Urine: 1.01
Urobilinogen, UA: 1
Urobilinogen, UA: 1
pH: 6.5

## 2011-07-10 LAB — I-STAT 8, (EC8 V) (CONVERTED LAB)
BUN: 17
Bicarbonate: 30 — ABNORMAL HIGH
Glucose, Bld: 103 — ABNORMAL HIGH
Hemoglobin: 15.3 — ABNORMAL HIGH
Sodium: 141
TCO2: 32
pCO2, Ven: 50.6 — ABNORMAL HIGH
pH, Ven: 7.382 — ABNORMAL HIGH

## 2011-07-10 LAB — DIFFERENTIAL
Basophils Relative: 0
Lymphocytes Relative: 14
Lymphs Abs: 0.7
Monocytes Absolute: 0.4
Monocytes Relative: 8
Neutro Abs: 4.2
Neutrophils Relative %: 77

## 2011-07-10 LAB — HEPATIC FUNCTION PANEL
ALT: 16
AST: 28
Albumin: 3.8
Alkaline Phosphatase: 54
Bilirubin, Direct: 0.3
Total Bilirubin: 1.2

## 2011-07-10 LAB — RAPID URINE DRUG SCREEN, HOSP PERFORMED
Amphetamines: NOT DETECTED
Barbiturates: NOT DETECTED
Benzodiazepines: POSITIVE — AB

## 2011-07-10 LAB — COMPREHENSIVE METABOLIC PANEL
ALT: 16
CO2: 24
Calcium: 9.7
Chloride: 103
Creatinine, Ser: 0.99
GFR calc non Af Amer: 55 — ABNORMAL LOW
Glucose, Bld: 97
Total Bilirubin: 1.3 — ABNORMAL HIGH

## 2011-07-10 LAB — CBC
HCT: 35.4 — ABNORMAL LOW
Hemoglobin: 12.8
MCHC: 32.7
Platelets: 282
RBC: 4.31
WBC: 5.2
WBC: 5.5

## 2011-07-10 LAB — POCT CARDIAC MARKERS
Myoglobin, poc: 106
Operator id: 151321
Troponin i, poc: <0.05

## 2011-07-10 LAB — URINE CULTURE

## 2011-07-10 NOTE — Telephone Encounter (Signed)
Physical Therapist Baruch Gouty with [facility] called to get verbal order to see patient once a week for the first week, twice a week for the second week and once a week for two more weeks. Patient is doing better. I okayed the order and told to call if needed anything else.

## 2011-07-17 NOTE — Discharge Summary (Signed)
Victoria [name], STAFF NO.:  [id number]  MEDICAL RECORD NO.:  [id number]  LOCATION:  1534                         FACILITY:  [facility]  PHYSICIAN:  Ardeth Sportsman, MD     DATE OF BIRTH:  Jun 05, 1932  DATE OF ADMISSION:  06/27/2011 DATE OF DISCHARGE:  06/30/2011                              DISCHARGE SUMMARY   PRIMARY CARE PHYSICIAN:  Georgina Quint. Plotnikov, MD  GASTROENTEROLOGIST:  Hedwig Morton. Juanda Chance, MD  CARDIOLOGIST:  Landry Corporal, MD  SURGEON:  Karie Soda, MD  FINAL DIAGNOSES: 1. Diverticulitis and perforation, with status post colostomy and     laparoscopic lysis of adhesions, colostomy takedown.  PRINCIPAL PROCEDURES PERFORMED: 1. Laparoscopic lysis of adhesions. 2. Colostomy takedown and rigid proctoscopy on June 27, 2011.  OTHER DISCHARGE DIAGNOSES:  Include: 1. Anxiety with prior psychosis in the past. 2. Atrial fibrillation, flutter converted, amiodarone transitioned     over to beta-blocker in the past. 3. Hypertension. 4. Mild dementia. 5. Hearing deficit and vertigo. 6. Mild ataxia. 7. Multiple myeloma, followed by Dr. Cyndie Chime. 8. Chronic dry eyes.  DISCHARGE MEDICATIONS:  Include home medications which are: 1. Vitamin D3 1000 units daily. 2. Atenolol 50 mg p.o. daily. 3. Hydrochlorothiazide 25 mg q.a.m. 4. Similasan Dry Eye Relief over-the-counter as needed. 5. Probiotic over-the-counter. 6. Amlodipine 5 mg q.h.s. 7. Multivitamin daily. 8. Lorazepam 1 mg q.12 hours p.r.n. anxiety. 9. Fish oil over-the-counter daily. 10.Tylenol 325 mg to 1 g  p.o. q.6 hours p.r.n. pain. 11.Tramadol 50-100 mg p.o. q.6 hours p.r.n. pain.  HOSPITAL COURSE:  Victoria [name] is a 58ish year old female who had a perforated diverticulitis that required emergency colectomy and colostomy in May 2012.  She recovered from that, had some rehab therapy, but is much stronger now.  She underwent laparoscopic lysis of adhesions and colostomy takedown on  June 27, 2011.  She was placed on the anti-ileus protocol.  She had prophylactic anticoagulation.  She was mobilized.  By the time of discharge, she was tolerating a solid diet, had bowel movements and flatus, had adequate pain control.  No medications.  She was in good spirits, with good energy.  She was cleared by occupational therapy for neurological needs.  She had close followup with her daughter.  The patient had improved and it was felt to be reasonable to discharge home on the following instructions: 1. She is to return to clinic, to me in about 1-2 weeks to make sure     she is continuing to improve. 2. She should take fiber and fluids and advance to regular diet as     tolerated.  She should watch for any extremes in bowel function and     control constipation, diarrhea with over-the-counter as instructed.     They should call if this becomes not controllable. 3. She should use ice, heat, warm showers, Tylenol, tramadol, all in     combination to help her pain control. 4. She should walk an hour a day, broken up into 20-30 minutes     segments.  Walker p.r.n. for balance. 5. She should call if she has worsening fevers, chills, sweats,  nausea, vomiting, uncontrolled pain, diarrhea, drainage from     incisions or other concerns.  I had these instructions given to the     patient and her daughter and they expressed understanding and     appreciation.     Ardeth Sportsman, MD     SCG/MEDQ  D:  06/30/2011  T:  06/30/2011  Job:  [id number]  cc:   Georgina Quint. Plotnikov, MD [address] [place] [address]  Hedwig Morton. Juanda Chance, MD [address] [place] [address]  Electronically Signed by Karie Soda MD on 07/17/2011 01:11:42 PM

## 2011-07-17 NOTE — Op Note (Signed)
Victoria [name], Victoria [name] NO.:  [id number]  MEDICAL RECORD NO.:  [id number]  LOCATION:  1534                         FACILITY:  [facility]  PHYSICIAN:  Victoria Sportsman, MD     DATE OF BIRTH:  [birth year]  DATE OF PROCEDURE:  06/27/2011 DATE OF DISCHARGE:                              OPERATIVE REPORT   PRIMARY CARE PHYSICIAN:  Victoria Quint. Plotnikov, MD  GASTROENTEROLOGIST:  Victoria Morton. Juanda Chance, MD  SURGEON:  Victoria Soda, MD  ASSISTANTS:  Victoria [name], M.D. and Victoria [name]. Victoria [name], M.D.  PREOPERATIVE DIAGNOSIS:  Status post partial colectomy and colostomy due to perforated diverticulitis in May 2012.  Wished for colostomy takedown.  POSTOPERATIVE DIAGNOSIS:  Status post partial colectomy and colostomy due to perforated diverticulitis in May 2012.  Wished for colostomy takedown.  PROCEDURE PERFORMED: 1. Laparoscopic lysis of adhesions x2 hours. 2. Laparoscopically-assisted colostomy takedown with EEA 29 stapled     anastomosis.3. Rigid proctoscopy.  ANESTHESIA: 1. General anesthesia. 2. Local anesthetic and a field block around all port sites and     colostomy site (Exparel used at colostomy site).  DRAINS:  None.  ESTIMATED BLOOD LOSS:  Minimal, less than 30 mL.  COMPLICATIONS:  None.  MAJOR INDICATION:  Victoria [name] is a 77 year old thin female with history of anxiety and intermittent paranoia and psychosis with perforated diverticulitis.  She required emergent surgical exploration and partial colectomy of the left colon for perforated diverticulitis contamination. She recovered from that.  She was a little bit debilitated, but is much stronger and now is almost 4 months out.  Because of the improvements and decent performance status, she and her family wished to have colostomy takedown.  The anatomy and physiology of the digestive tract was explained. Pathophysiology of diverticulitis was discussed.  Options were discussed and recommendations were made for  colostomy takedown.  She got cardiac clearance.  Laparoscopic and possible techniques were discussed.  Risks including bleeding, infection, leak, reoperation, stroke, heart attack, death, and other numerous risks were discussed in detail.  Questions were answered and she and her family agreed to proceed.  OPERATIVE FINDINGS:  She had a large volume of mostly soft adhesions, primarily intraloop into the retroperitoneum greater than to the anterior abdominal wall.  Some moderate to her pelvis as well.  Her anastomosis is at 13 cm from the anal verge.  It is a 29 EEA stapler to the anastomosis.  DESCRIPTION OF PROCEDURE:  Informed consent was confirmed.  The patient underwent general anesthesia without any difficulty.  She was placed on the anti-ileus protocol.  She received subcutaneous heparin preoperatively.  She had sequential compression devices active during the entire case.  I did a 2-0 silk pursestring stitch to help to close the colostomy in the left upper quadrant.  She had a Foley catheter sterilely placed.  Her abdomen and perineum were prepped and draped in sterile fashion.  Surgical timeout confirmed our plan.  I placed a #5 mm port in the right upper quadrant using optical entry technique with the patient in steep reverse Trendelenburg and right side up.  Entry was clean.  I induced carbon dioxide insufflation.  Camera inspection revealed  no injury.  Under direct visualization, I placed 5 mm ports in the right flank and right lower quadrant.  I later put a 5 mm port to the prior midline incision and just inferior to the umbilicus.  Camera inspection revealed moderate gossamer adhesions of greater omentum and small bowel to the anterior abdominal wall.  I was able to break those up using cold scissors, sharp dissection as well as some general blunt dissection.  I found this mobile and was able to gradually mobilize it and free it off adhesions down in the pelvis.  It was  rather densely adherent to her right ovary.  Eventually after freeing it off the right ovary, I could then mobilize the small bowel out of the pelvis.  She had some moderate adhesions along the left pelvic brim over the left psoas muscle, but gradually I was able to free it off and come up the left paracolic gutter.  The most dense adhesions seen to be up in the splenic flexure and to the left kidney, but eventually I was able to free this off after meticulously mobilizing.  I used cold scissors only.  She did have some moderate adhesions too.  She had a few adhesions to the colostomy, but I was able to free the small bowel off it.  Eventually I was able to free all the small bowel off the left side of the abdomen, rotated back over to the right side.  There was one persistent loop, but after inspecting them, it was consistent with the ligament of Treitz.  My partner agreed.  I freed some greater omentum and stomach off that region for better mobility.  We felt that we had adequate mobilization.  I proceeded with colostomy takedown.  I had already done some laparoscopic freeing of the colon out of the abdominal wall from the peritoneal side.  I went ahead and made a transverse elliptical incision around the colostomy using a scalpel.  I used some careful cautery and blunt and sharp dissection to free the colon from its attachments to the abdominal wall.  I was able to see and identify the fascia well.  I was able to come in medially, into the peritoneal cavity and able to use with help of finger and sharp dissection, freed the remaining adhesions from the deeper part of the abdominal wall.  With adequate mobilization the colon out and they came out about almost 20 cm quite easily.  She had a very thinned out mesentery that was stretched with excellent blood supply.  I used sharp scissors to free about 7 mm of the distal colon and the skin edge off.  A 29 EEAs sizer fit the snug into it,  so I used that.  I placed a 29 EEA anvil into the end of the trimmed colostomy and closed around it using a 0 Prolene pursestring stitch.  I returned the colon and the abdomen.  I used some towel clamps to help close the fascial defect skin.  I changed gloves and we did inspection in the abdomen.  I mobilized the small bowel away and the colon was able to reach down to the pelvic brim.  I went down and dilated the anus and using EEA sizers up to the larger sizer, it could easily come up the rectal pouch.  I had already removed some mucus balls out of the rectum at the beginning of the case and made sure that there was no retained mucus or stool left in  the pouch, I brought the stapler up rather easily.  I brought the spike out the right anterior, proximal, and the rectal stump without difficulty.  The spike came out easily.  We attached the anvil of the left colon to the spike of the stapler and clamped that down.  I held the clamp for 60 seconds.  I fired and held that fired for 30 seconds. I released it.  I inspected two excellent anastomotic rings.  We did copious irrigation of the pelvis.  There was no obvious leak or other abnormality.  I did a leak test by clamping the colon proximal to the anastomosis and doing rigid proctoscopy and rectal insufflation.  I could see the anastomosis at 13 cm from the anal verge.  Stapler line was cleaned with no active bleeding.  There was no leak under water. Mucosa looked normal.  There were no retained stools or other abnormalities.  I went ahead and desufflated the anastomosis and rectum.  I did irrigation.  Hemostasis was excellent in the left side of the abdomen.  I reinspected the small bowel and found no evidence of any serosal injury.  There was some soft intraloop adhesions, but most of those had been freed off and no more severe kinking.  I allowed the small bowel to lay over the transverse colorectal anastomosis.  I desufflated the  abdomen.  I placed the Exparel on the preperitoneal planes around the colostomy site.  I closed the colostomy using 0 Vicryl.  Posterior rectus then closed vertically and then transversely closed the anterior rectus fascia using #1 loop PDS to get the result. I closed the 5 mm ports sites using Monocryl stitch.  I closed the colostomy site using a few interrupted Monocryl stitches with plenty of gaps and I placed some wicks into the colostomy as well after doing copious irrigation, and ensuring good hemostasis and no contamination. Sterile dressings applied.  The patient has been extubated and is in recovery room.  She is stable. I discussed postop care in detail with the patient and her daughter.     Victoria Sportsman, MD     SCG/MEDQ  D:  06/27/2011  T:  06/27/2011  Job:  [id number]  cc:   Victoria Quint. Plotnikov, MD [address] [place] [address]  Electronically Signed by Victoria Soda MD on 07/17/2011 01:13:34 PM

## 2011-07-29 ENCOUNTER — Encounter (INDEPENDENT_AMBULATORY_CARE_PROVIDER_SITE_OTHER): Payer: Self-pay | Admitting: Surgery

## 2011-07-29 ENCOUNTER — Encounter (INDEPENDENT_AMBULATORY_CARE_PROVIDER_SITE_OTHER): Payer: Medicare Other | Admitting: Surgery

## 2011-07-29 ENCOUNTER — Ambulatory Visit (INDEPENDENT_AMBULATORY_CARE_PROVIDER_SITE_OTHER): Payer: Medicare Other | Admitting: Surgery

## 2011-07-29 VITALS — BP 162/94 | HR 78 | Temp 98.1°F | Resp 16 | Ht 62.5 in | Wt 108.0 lb

## 2011-07-29 DIAGNOSIS — K5732 Diverticulitis of large intestine without perforation or abscess without bleeding: Secondary | ICD-10-CM

## 2011-07-29 DIAGNOSIS — K5720 Diverticulitis of large intestine with perforation and abscess without bleeding: Secondary | ICD-10-CM

## 2011-07-29 NOTE — Progress Notes (Signed)
Subjective:     Patient ID: Victoria [name], female   DOB: 11/24/1931, 77 y.o.   MRN: [id number]  HPI  Patient Care Team: Sonda Primes, MD as PCP - General Levert Feinstein, MD (Hematology and Oncology) Hart Carwin, MD as Consulting Physician (Gastroenterology) Marykay Lex as Consulting Physician (Cardiology)  This patient is a 78 y.o.female who presents today for surgical evaluation.   Procedure: Laparoscopically assisted colostomy takedown 06/27/2011.  Patient comes today feeling okay. Possibly lost a little weight but gaining it back. Eating better. Trying to walk an hour a day. Her daughter reports that the patient doesn't look it, but she is trying to be  compliant. About 2 bowel movements a day. Energy level is slowly returning. She is concerned about a possible exposed stitch on her abdomen. It turned out to be a leftover dressing. I went and removed it.  Past Medical History  Diagnosis Date  . Hypertension   . Insomnia   . Anxiety   . Hearing loss     bilateral hearing aids  . Anemia   . Multiple myeloma   . Multiple myeloma     Past Surgical History  Procedure Date  . Colostomy 2012  . Colectomy 01/2011    perforation/stoma  . Colonoscopy   . Partial hysterectomy   . Cesarean section   . Colostomy takedown 06/27/11  . Cholecystectomy 2012    laparoscopic    History   Social History  . Marital Status: Divorced    Spouse Name: N/A    Number of Children: N/A  . Years of Education: N/A   Occupational History  . Not on file.   Social History Main Topics  . Smoking status: Never Smoker   . Smokeless tobacco: Never Used  . Alcohol Use: No  . Drug Use: No  . Sexually Active: No   Other Topics Concern  . Not on file   Social History Narrative  . No narrative on file    Family History  Problem Relation Age of Onset  . Hypertension Other   . Cancer Father     prostate  . Colitis Neg Hx   . Esophageal cancer Neg Hx   . Stomach cancer Neg Hx      Current outpatient prescriptions:amiodarone (PACERONE) 200 MG tablet, TAKE 1 TABLET TWICE A DAY, Disp: 180 tablet, Rfl: 1;  amLODipine (NORVASC) 5 MG tablet, Take 1 tablet (5 mg total) by mouth daily., Disp: 90 tablet, Rfl: 1;  carvedilol (COREG) 12.5 MG tablet, Take 1 tablet (12.5 mg total) by mouth 2 (two) times daily., Disp: 60 tablet, Rfl: 11;  cholecalciferol (VITAMIN D) 1000 UNITS tablet, Take 1,000 Units by mouth daily.  , Disp: , Rfl:  hydrochlorothiazide 25 MG tablet, Take 25 mg by mouth daily.  , Disp: , Rfl: ;  Iron 25 MG TABS, Take 1 tablet by mouth 3 (three) times daily.  , Disp: , Rfl: ;  KRILL OIL PO, Take 250 mg by mouth daily.  , Disp: , Rfl: ;  LORazepam (ATIVAN) 1 MG tablet, Take 1 tablet (1 mg total) by mouth 2 (two) times daily as needed for anxiety., Disp: 60 tablet, Rfl: 3 Multiple Vitamins-Minerals (MULTIVITAMIN WITH MINERALS) tablet, Take 1 tablet by mouth daily.  , Disp: , Rfl: ;  traMADol (ULTRAM) 50 MG tablet, Take 1 tablet (50 mg total) by mouth every 6 (six) hours as needed for pain., Disp: 40 tablet, Rfl: 0 Current facility-administered medications:0.9 %  sodium  chloride infusion, 500 mL, Intravenous, Continuous, Hart Carwin, MD  Allergies  Allergen Reactions  . Amiodarone     rash  . Amoxicillin Hives and Itching  . Aspirin   . Atenolol     Hair loss       Review of Systems  Constitutional: Negative for fever, chills and diaphoresis.  HENT: Negative for ear pain, sore throat and trouble swallowing.   Eyes: Negative for photophobia and visual disturbance.  Respiratory: Negative for cough and choking.   Cardiovascular: Negative for chest pain, palpitations and leg swelling.  Gastrointestinal: Negative for nausea, vomiting, abdominal pain, diarrhea, constipation, blood in stool, anal bleeding and rectal pain.       BM 2-3 x / day  Genitourinary: Negative for dysuria, frequency and difficulty urinating.  Musculoskeletal: Positive for myalgias. Negative  for back pain, joint swelling, arthralgias and gait problem.  Skin: Negative for color change, pallor and rash.  Neurological: Negative for dizziness, speech difficulty, weakness and numbness.  Hematological: Negative for adenopathy.  Psychiatric/Behavioral: Positive for decreased concentration. Negative for suicidal ideas, hallucinations, confusion, sleep disturbance, dysphoric mood and agitation. The patient is not nervous/anxious.        Objective:   Physical Exam  Constitutional: She is oriented to person, place, and time. She appears well-developed and well-nourished. No distress.  HENT:  Head: Normocephalic.  Mouth/Throat: Oropharynx is clear and moist. No oropharyngeal exudate.  Eyes: Conjunctivae and EOM are normal. Pupils are equal, round, and reactive to light. No scleral icterus.  Neck: Normal range of motion. No tracheal deviation present.  Cardiovascular: Normal rate and intact distal pulses.   Pulmonary/Chest: Effort normal. No respiratory distress. She exhibits no tenderness.  Abdominal: Soft. She exhibits no distension and no mass. There is no tenderness. There is no rebound and no guarding. Hernia confirmed negative in the right inguinal area and confirmed negative in the left inguinal area.       Incisions clean with normal healing ridges.  No hernias  Genitourinary: No vaginal discharge found.  Musculoskeletal: Normal range of motion. She exhibits no tenderness.  Lymphadenopathy:       Right: No inguinal adenopathy present.       Left: No inguinal adenopathy present.  Neurological: She is alert and oriented to person, place, and time. No cranial nerve deficit. She exhibits normal muscle tone. Coordination normal.  Skin: Skin is warm and dry. No rash noted. She is not diaphoretic.  Psychiatric: She has a normal mood and affect. Her behavior is normal.       Pleasant, loving.  Mildly confused (baseline)       Assessment:     One month s/p colostomy takedown after  emergency Hartmann for perforated diverticulitis    Plan:     Increase activity as tolerated.  Do not push through pain.  Advanced on diet as tolerated. Bowel regimen to avoid problems.  Return to clinic p.r.n. The patient & daughter expressed understanding and appreciation.  I got another hug from her.

## 2011-07-31 ENCOUNTER — Other Ambulatory Visit (INDEPENDENT_AMBULATORY_CARE_PROVIDER_SITE_OTHER): Payer: Self-pay

## 2011-07-31 MED ORDER — TRAMADOL HCL 50 MG PO TABS: 50.0000 mg | ORAL_TABLET | Freq: Four times a day (QID) | ORAL | Status: DC | PRN

## 2011-07-31 NOTE — Telephone Encounter (Signed)
Pt's daughter requesting refill on Ultram for pt. Per Dr Michaell Cowing ok to refill one more time NO more refills.Victoria [name]

## 2011-08-20 ENCOUNTER — Telehealth (INDEPENDENT_AMBULATORY_CARE_PROVIDER_SITE_OTHER): Payer: Self-pay

## 2011-08-20 MED ORDER — TRAMADOL HCL 50 MG PO TABS: 50.0000 mg | ORAL_TABLET | Freq: Four times a day (QID) | ORAL | Status: DC | PRN

## 2011-08-20 NOTE — Telephone Encounter (Signed)
LMOM notifying daughter that Dr Michaell Cowing would ok to refill Tramadol 50mg   One more time but no more refills. We will e-file script.Hulda Humphrey

## 2011-08-28 ENCOUNTER — Other Ambulatory Visit: Payer: Self-pay | Admitting: Oncology

## 2011-08-29 ENCOUNTER — Telehealth: Payer: Self-pay | Admitting: Oncology

## 2011-08-29 NOTE — Telephone Encounter (Signed)
Talked to pt, tried to give appt with Misty Stanley, pt wants to see MD, gave appt for February per pt rqst.

## 2011-08-30 NOTE — Telephone Encounter (Signed)
OK but she needs at least 30 minutes

## 2011-09-05 ENCOUNTER — Encounter: Payer: Self-pay | Admitting: Internal Medicine

## 2011-09-05 ENCOUNTER — Ambulatory Visit (INDEPENDENT_AMBULATORY_CARE_PROVIDER_SITE_OTHER): Payer: Medicare Other | Admitting: Internal Medicine

## 2011-09-05 DIAGNOSIS — R279 Unspecified lack of coordination: Secondary | ICD-10-CM

## 2011-09-05 DIAGNOSIS — C9000 Multiple myeloma not having achieved remission: Secondary | ICD-10-CM

## 2011-09-05 DIAGNOSIS — I10 Essential (primary) hypertension: Secondary | ICD-10-CM

## 2011-09-05 MED ORDER — TRAMADOL HCL 50 MG PO TABS: 50.0000 mg | ORAL_TABLET | Freq: Four times a day (QID) | ORAL | Status: DC | PRN

## 2011-09-05 MED ORDER — LORAZEPAM 1 MG PO TABS: 1.0000 mg | ORAL_TABLET | Freq: Two times a day (BID) | ORAL | Status: DC | PRN

## 2011-09-05 NOTE — Assessment & Plan Note (Signed)
Continue with current prescription therapy as reflected on the Med list.  

## 2011-09-05 NOTE — Assessment & Plan Note (Signed)
States BP is nl at home -- always (White coat HTN) Seems to be doing well at home

## 2011-09-05 NOTE — Progress Notes (Signed)
  Subjective:    Patient ID: Victoria [name], female    DOB: 02/10/1932, 77 y.o.   MRN: [id number]  HPI The patient presents for a follow-up of  chronic hypertension, chronic dyslipidemia, post-op issues, controlled with medicines  Wt Readings from Last 3 Encounters:  09/05/11 108 lb (48.988 kg)  07/29/11 108 lb (48.988 kg)  07/07/11 109 lb 6.4 oz (49.624 kg)    BP Readings from Last 3 Encounters:  09/05/11 192/80  07/29/11 162/94  07/07/11 136/88      Review of Systems  Constitutional: Negative for chills, activity change, appetite change, fatigue and unexpected weight change.  HENT: Negative for congestion, mouth sores and sinus pressure.   Eyes: Negative for visual disturbance.  Respiratory: Negative for cough and chest tightness.   Gastrointestinal: Negative for nausea and abdominal pain.  Genitourinary: Negative for frequency, difficulty urinating and vaginal pain.  Musculoskeletal: Negative for back pain and gait problem.  Skin: Negative for pallor and rash.  Neurological: Negative for dizziness, tremors, weakness, numbness and headaches.  Psychiatric/Behavioral: Negative for confusion and sleep disturbance. The patient is nervous/anxious.        Objective:   Physical Exam  Constitutional: She appears well-developed. No distress.       thin  HENT:  Head: Normocephalic.  Right Ear: External ear normal.  Left Ear: External ear normal.  Nose: Nose normal.  Mouth/Throat: Oropharynx is clear and moist.  Eyes: Conjunctivae are normal. Pupils are equal, round, and reactive to light. Right eye exhibits no discharge. Left eye exhibits no discharge.  Neck: Normal range of motion. Neck supple. No JVD present. No tracheal deviation present. No thyromegaly present.  Cardiovascular: Normal rate, regular rhythm and normal heart sounds.   Pulmonary/Chest: No stridor. No respiratory distress. She has no wheezes.  Abdominal: Soft. Bowel sounds are normal. She exhibits no distension  and no mass. There is no tenderness. There is no rebound and no guarding.  Musculoskeletal: She exhibits no edema and no tenderness.  Lymphadenopathy:    She has no cervical adenopathy.  Neurological: She displays normal reflexes. No cranial nerve deficit. She exhibits normal muscle tone. Coordination normal.  Skin: No rash noted. No erythema.  Psychiatric: She has a normal mood and affect. Her behavior is normal. Judgment and thought content normal.          Assessment & Plan:

## 2011-09-08 ENCOUNTER — Encounter: Payer: Self-pay | Admitting: Internal Medicine

## 2011-09-08 ENCOUNTER — Telehealth: Payer: Self-pay | Admitting: *Deleted

## 2011-09-08 ENCOUNTER — Other Ambulatory Visit: Payer: Medicare Other | Admitting: Lab

## 2011-09-08 ENCOUNTER — Ambulatory Visit: Payer: Medicare Other | Admitting: Nurse Practitioner

## 2011-09-08 NOTE — Telephone Encounter (Signed)
ON 04/08/11 PT. HAD LAB DRAWN AND A PHYSICIAN'S VISIT. HER NEXT LAB AND PHYSICIAN'S VISIT IS 11/21/11. IS THIS OK? PT. AND HER DAUGHTER REFUSE TO SEE A MID LEVEL. THIS NOTE TO DR.GRANFORTUNA'S ACTIVE WORK BOX.

## 2011-11-10 ENCOUNTER — Telehealth: Payer: Self-pay

## 2011-11-10 ENCOUNTER — Other Ambulatory Visit: Payer: Self-pay | Admitting: *Deleted

## 2011-11-10 MED ORDER — AMLODIPINE BESYLATE 5 MG PO TABS: 5.0000 mg | ORAL_TABLET | Freq: Every day | ORAL | Status: DC

## 2011-11-10 NOTE — Telephone Encounter (Signed)
Pt's daughter called stating that Rx for Ativan given at last OV has been misplaced. Daughter is requesting refill, please advise.

## 2011-11-10 NOTE — Telephone Encounter (Signed)
OK to replace the Rx Thx

## 2011-11-11 ENCOUNTER — Telehealth: Payer: Self-pay | Admitting: *Deleted

## 2011-11-11 MED ORDER — LORAZEPAM 1 MG PO TABS: 1.0000 mg | ORAL_TABLET | Freq: Two times a day (BID) | ORAL | Status: DC | PRN

## 2011-11-11 NOTE — Telephone Encounter (Signed)
Pt's daughter informed rf called in.

## 2011-11-11 NOTE — Telephone Encounter (Signed)
Left msg on vm requesting to speak with nurse concerning ativan rx. MD gave rx at office visit.... 11/11/11@4 :17pm/LMB

## 2011-11-11 NOTE — Telephone Encounter (Signed)
Left mess for daughter to call back. Rf to be called in or printed?

## 2011-11-11 NOTE — Telephone Encounter (Signed)
Pt left vm stating Rf can be called in to [facility] [address]

## 2011-11-21 ENCOUNTER — Telehealth: Payer: Self-pay | Admitting: Oncology

## 2011-11-21 ENCOUNTER — Other Ambulatory Visit: Payer: Medicare Other | Admitting: Lab

## 2011-11-21 ENCOUNTER — Ambulatory Visit (HOSPITAL_BASED_OUTPATIENT_CLINIC_OR_DEPARTMENT_OTHER): Payer: Medicare Other | Admitting: Oncology

## 2011-11-21 DIAGNOSIS — C9000 Multiple myeloma not having achieved remission: Secondary | ICD-10-CM

## 2011-11-21 LAB — CBC WITH DIFFERENTIAL/PLATELET
BASO%: 0.5 % (ref 0.0–2.0)
EOS%: 0.4 % (ref 0.0–7.0)
HCT: 34.4 % — ABNORMAL LOW (ref 34.8–46.6)
LYMPH%: 24.9 % (ref 14.0–49.7)
MCH: 31.1 pg (ref 25.1–34.0)
MCHC: 33.3 g/dL (ref 31.5–36.0)
NEUT%: 65.3 % (ref 38.4–76.8)
Platelets: 203 10*3/uL (ref 145–400)
RBC: 3.68 10*6/uL — ABNORMAL LOW (ref 3.70–5.45)
WBC: 3 10*3/uL — ABNORMAL LOW (ref 3.9–10.3)
lymph#: 0.8 10*3/uL — ABNORMAL LOW (ref 0.9–3.3)

## 2011-11-21 NOTE — Telephone Encounter (Signed)
gv pts daughter appt for april2013.  scheduled bone survey for 03/08 @ [facility]

## 2011-11-24 ENCOUNTER — Telehealth: Payer: Self-pay | Admitting: *Deleted

## 2011-11-24 ENCOUNTER — Encounter: Payer: Self-pay | Admitting: *Deleted

## 2011-11-24 LAB — COMPREHENSIVE METABOLIC PANEL
ALT: 15 U/L (ref 0–35)
AST: 19 U/L (ref 0–37)
Creatinine, Ser: 0.84 mg/dL (ref 0.50–1.10)
Sodium: 144 mEq/L (ref 135–145)
Total Bilirubin: 1 mg/dL (ref 0.3–1.2)
Total Protein: 7.8 g/dL (ref 6.0–8.3)

## 2011-11-24 LAB — KAPPA/LAMBDA LIGHT CHAINS
Kappa free light chain: 2.53 mg/dL — ABNORMAL HIGH (ref 0.33–1.94)
Kappa:Lambda Ratio: 0.04 — ABNORMAL LOW (ref 0.26–1.65)
Lambda Free Lght Chn: 62.9 mg/dL — ABNORMAL HIGH (ref 0.57–2.63)

## 2011-11-24 LAB — IGG, IGA, IGM: IgM, Serum: 22 mg/dL — ABNORMAL LOW (ref 52–322)

## 2011-11-24 NOTE — Telephone Encounter (Signed)
Message copied by Reesa Chew on Mon Nov 24, 2011  3:40 PM ------      Message from: Levert Feinstein      Created: Mon Nov 24, 2011  8:08 AM       Call  Patients's daughter IgG 1800 stable comparedw value fromJuly, 2012 - if bone X-rays turn out good - we can continue to watch off Rx for her myeloma

## 2011-11-25 ENCOUNTER — Telehealth: Payer: Self-pay | Admitting: *Deleted

## 2011-11-25 NOTE — Telephone Encounter (Signed)
Message copied by Sabino Snipes on Tue Nov 25, 2011  3:42 PM ------      Message from: Levert Feinstein      Created: Mon Nov 24, 2011  8:08 AM       Call  Patients's daughter IgG 1800 stable comparedw value fromJuly, 2012 - if bone X-rays turn out good - we can continue to watch off Rx for her myeloma

## 2011-11-25 NOTE — Progress Notes (Signed)
Hematology and Oncology Follow Up Visit  Victoria [name] [id number] 1932/07/11 78 y.o. 11/25/2011 8:15 PM   Principle Diagnosis: Encounter Diagnosis  Name Primary?  . Multiple myeloma Yes     Interim History:   Followup visit for this frail 78 year old woman diagnosed with early stage multiple myeloma back in October of 2008 when she was incidentally found to have a small bone lesion in the right iliac bone seen on x-rays to evaluate abdominal pain. The lesion was confirmed by CT scan. A needle biopsy was done on 07/28/2007 and showed increased plasma cells.I obtained additional laboratory studies in anticipation of today's visit.  These show BUN 15, creatinine 0.8, total protein 8.3, albumin 4.1, calcium 9.8, LDH 154.  Beta-2 microglobulin 1.79 (1.01-1.73).  Serum immunoglobulins show mild elevation of IgG at 2380 mg% (478-2956) and IgA 676 mg% (68-378), with suppression of IgM 43 mg% (60-263).  Immunofixation electrophoresis shows monoclonal IgG lambda and monoclonal IgA kappa protein.  Kappa free light chains in the serum 1.65 (0.33-1.94) and lambda free light chains 9.45 (normal 0.57-2.63), with ration of 0.17.  She had mild anemia with hemoglobin 11.6. She had no proteinuria and no monoclonal free light chains on IFE of urine. She had some vague lesions in the skull and proximal humeri on bone survey. Bilateral posterior iliac crest bone marrow biopsies were done on 11/04/2007 and only showed a slight elevation of plasma cells of 11%. She was started on initial treatment with oral Alkeran and prednisone. After 2 cycles low-dose thalidomide was added at 100 mg daily. She was unable to tolerate this drug due to CNS side effects. She did achieve a nice remission on the Alkeran and prednisone which was continued for a total of 6 months. She had her last cycle of treatment in October 2009. We have been monitoring her lab studies since that time and she remains in a stable plateau.  She recently had  a prolonged hospitalization when she presented with acute abdominal pain and was found to have a perforated diverticulum requiring emergency surgery and a temporary colostomy. This occurred in May of 2012. She had a prolonged recovery and has not been seen in this office since July of 2012.  She is now back on her feet. Her colostomy is functioning well. She has had no interim infections.  Medications: reviewed  Allergies:  Allergies  Allergen Reactions  . Amiodarone     rash  . Amoxicillin Hives and Itching  . Aspirin   . Atenolol     Hair loss    Review of Systems: Constitutional:   She is gaining back lost weight Respiratory: No cough or dyspnea Cardiovascular:  No chest pain or palpitations Gastrointestinal: no abdominal pain no hematochezia Genito-Urinary: No urinary tract symptoms Musculoskeletal: No bone pain Neurologic: No headache or change in vision Skin: No rash or ecchymosis Remaining ROS negative.  Physical Exam: Blood pressure 180/78, pulse 111, temperature 98.8 F (37.1 C), temperature source Oral, height 5' 2.5" (1.588 m), weight 106 lb 1.6 oz (48.127 kg). Wt Readings from Last 3 Encounters:  11/21/11 106 lb 1.6 oz (48.127 kg)  09/05/11 108 lb (48.988 kg)  07/29/11 108 lb (48.988 kg)     General appearance: Thin frail African American woman HENNT: Normal Lymph nodes: No adenopathy Breasts: Lungs: Clear to auscultation resonant to percussion Heart: Regular cardiac rhythm no murmur Abdomen: Soft nontender no mass no organomegaly Extremities: No edema no calf tenderness Vascular: No cyanosis Neurologic: No focal deficit Skin: No rash  or ecchymosis  Lab Results: Lab Results  Component Value Date   WBC 3.0* 11/21/2011   HGB 11.4* 11/21/2011   HCT 34.4* 11/21/2011   MCV 93.4 11/21/2011   PLT 203 11/21/2011     Chemistry      Component Value Date/Time   NA 144 11/21/2011 1409   K 3.6 11/21/2011 1409   CL 105 11/21/2011 1409   CO2 29 11/21/2011 1409    BUN 18 11/21/2011 1409   CREATININE 0.84 11/21/2011 1409      Component Value Date/Time   CALCIUM 10.2 11/21/2011 1409   ALKPHOS 56 11/21/2011 1409   AST 19 11/21/2011 1409   ALT 15 11/21/2011 1409   BILITOT 1.0 11/21/2011 1409    IgA I 335 mg percent which is in the normal range IgG 1800 mg percent slightly lower than the value of 1850 done 04/08/2011 and just above normal range which goes up to 1700 IgM remains suppressed at 22 mg percent Kappa free light chains 2.53 mg percent normal up to 1.94 Lambda free light chains up from 19 mg percent in July to 62.9 mg percent with kappa lambda ratio 0.04   Impression and Plan: #1. Early stage biclonal IgG  and IgA multiple myeloma Despite an increase in the lambda free light chains between July 2012 and now, remainder of her hematologic profile and serum immunoglobulin levels remain stable. Going to go ahead and get a followup bone survey. If this is also stable, I will continue observation alone and not put her back on chemotherapy.  #2. Perforated diverticulum. Now with a colostomy. She may eventually have a colostomy closure procedure.  #3. Essential hypertension.  #4. History of a psychotic episode in the past.   CC:. Dr. Jola Schmidt; [facility] [place] [facility]   Levert Feinstein, MD 2/26/20138:15 PM

## 2011-11-25 NOTE — Telephone Encounter (Signed)
Called pt, left message reagrding appt on 12/05/11 and April 2013 for lab and MD

## 2011-11-25 NOTE — Telephone Encounter (Signed)
Message left for daughter, Revonda Standard to call back.

## 2011-11-27 ENCOUNTER — Telehealth: Payer: Self-pay | Admitting: *Deleted

## 2011-11-27 NOTE — Telephone Encounter (Signed)
Copy of recent labs mailed to Evalee Mutton, Pt's daughter, per Dr. Cyndie Chime.

## 2011-12-05 ENCOUNTER — Telehealth: Payer: Self-pay | Admitting: *Deleted

## 2011-12-05 ENCOUNTER — Ambulatory Visit (HOSPITAL_COMMUNITY)
Admission: RE | Admit: 2011-12-05 | Discharge: 2011-12-05 | Disposition: A | Discharge status: Home / Self Care | Payer: Medicare Other | Source: Ambulatory Visit | Attending: Oncology | Admitting: Oncology

## 2011-12-05 ENCOUNTER — Ambulatory Visit: Payer: Medicare Other | Admitting: Internal Medicine

## 2011-12-05 DIAGNOSIS — C9000 Multiple myeloma not having achieved remission: Secondary | ICD-10-CM | POA: Insufficient documentation

## 2011-12-05 DIAGNOSIS — M899 Disorder of bone, unspecified: Secondary | ICD-10-CM | POA: Insufficient documentation

## 2011-12-05 NOTE — Telephone Encounter (Signed)
Daughter, Revonda Standard notified of bone survey results & she would like a copy of report.  This will be mailed to her.

## 2011-12-05 NOTE — Telephone Encounter (Signed)
Message copied by Sabino Snipes on Fri Dec 05, 2011  4:20 PM ------      Message from: Levert Feinstein      Created: Fri Dec 05, 2011  3:49 PM       Call daughter - bone X rays stable - OK to continue observation alone for mom's myelopma

## 2012-01-09 ENCOUNTER — Other Ambulatory Visit (HOSPITAL_BASED_OUTPATIENT_CLINIC_OR_DEPARTMENT_OTHER): Payer: Medicare Other | Admitting: Lab

## 2012-01-09 DIAGNOSIS — C9000 Multiple myeloma not having achieved remission: Secondary | ICD-10-CM

## 2012-01-09 LAB — CBC WITH DIFFERENTIAL/PLATELET
BASO%: 0.8 % (ref 0.0–2.0)
LYMPH%: 23.7 % (ref 14.0–49.7)
MCHC: 32.8 g/dL (ref 31.5–36.0)
MONO#: 0.3 10*3/uL (ref 0.1–0.9)
RBC: 3.46 10*6/uL — ABNORMAL LOW (ref 3.70–5.45)
RDW: 13.8 % (ref 11.2–14.5)
WBC: 3.1 10*3/uL — ABNORMAL LOW (ref 3.9–10.3)
lymph#: 0.7 10*3/uL — ABNORMAL LOW (ref 0.9–3.3)
nRBC: 0 % (ref 0–0)

## 2012-01-09 LAB — MORPHOLOGY: PLT EST: ADEQUATE

## 2012-01-13 LAB — IMMUNOFIXATION ELECTROPHORESIS
IgM, Serum: 20 mg/dL — ABNORMAL LOW (ref 52–322)
Total Protein, Serum Electrophoresis: 7.9 g/dL (ref 6.0–8.3)

## 2012-01-13 LAB — KAPPA/LAMBDA LIGHT CHAINS: Kappa free light chain: 2.18 mg/dL — ABNORMAL HIGH (ref 0.33–1.94)

## 2012-01-14 ENCOUNTER — Telehealth: Payer: Self-pay | Admitting: *Deleted

## 2012-01-14 NOTE — Telephone Encounter (Signed)
Message copied by Sabino Snipes on Wed Jan 14, 2012  1:09 PM ------      Message from: Levert Feinstein      Created: Tue Jan 13, 2012  4:30 PM       Call daughter - antibody levels stable on her mom - no need to go back on Rx

## 2012-01-14 NOTE — Telephone Encounter (Signed)
Notified daughter, Revonda Standard of lab result per Dr Cyndie Chime & no need for Rx.  They will come for appt this fri.

## 2012-01-16 ENCOUNTER — Ambulatory Visit (HOSPITAL_BASED_OUTPATIENT_CLINIC_OR_DEPARTMENT_OTHER): Payer: Medicare Other | Admitting: Oncology

## 2012-01-16 DIAGNOSIS — Z8659 Personal history of other mental and behavioral disorders: Secondary | ICD-10-CM

## 2012-01-16 DIAGNOSIS — C9000 Multiple myeloma not having achieved remission: Secondary | ICD-10-CM

## 2012-01-16 DIAGNOSIS — I10 Essential (primary) hypertension: Secondary | ICD-10-CM

## 2012-01-16 NOTE — Progress Notes (Signed)
Hematology and Oncology Follow Up Visit  JOLEAH KOSAK [id number] 03-Jul-1932 78 y.o. 01/16/2012 7:08 PM   Principle Diagnosis: Encounter Diagnosis  Name Primary?  . Multiple myeloma Yes     Interim History:   Followup visit for this frail 78 year old woman diagnosed with early stage multiple myeloma back in October of 2008 when she was incidentally found to have a small bone lesion in the right iliac bone seen on x-rays to evaluate abdominal pain. The lesion was confirmed by CT scan. A needle biopsy was done on 07/28/2007 and showed increased plasma cells.Additional laboratory studies  Showed  BUN 15, creatinine 0.8, total protein 8.3, albumin 4.1, calcium 9.8, LDH 154. Beta-2 microglobulin 1.79 (1.01-1.73). Serum immunoglobulins showed mild elevation of IgG at 2380 mg% (562-1308) and IgA 676 mg% (68-378), with suppression of IgM 43 mg% (60-263). Immunofixation electrophoresis showed monoclonal IgG lambda and monoclonal IgA kappa protein. Kappa free light chains in the serum 1.65 (0.33-1.94) and lambda free light chains 9.45 (normal 0.57-2.63), with ration of 0.17. She had mild anemia with hemoglobin 11.6. She had no proteinuria and no monoclonal free light chains on IFE of urine. She had some vague lesions in the skull and proximal humeri on bone survey.  Bilateral posterior iliac crest bone marrow biopsies were done on 11/04/2007 and only showed a slight elevation of plasma cells of 11%.  She was started on initial treatment with oral Alkeran and prednisone. After 2 cycles low-dose thalidomide was added at 100 mg daily. She was unable to tolerate this drug due to CNS side effects.  She did achieve a nice remission on the Alkeran and prednisone which was continued for a total of 6 months. She had her last cycle of treatment in October 2009. We have been monitoring her lab studies since that time and she remains in a stable plateau. More recently there has been a elevation of her lambda serum free  light chains up to 62.9 mg percent on 11/21/2011. However repeat value done at a short interval on April 12 was overall stable at 65.1 mg percent. Serum total IgG and IgA remain stable and both are in the normal range. Recent value on April 12 IgG 1590 IgA 328. CBC remains stable with hemoglobin was ranging 10-11 g on a chronic basis. Chronic mild leukopenia. Normal platelet count. A followup metastatic bone survey done and this February showed no new lesions. She has some spotty punched out lesions in the skull and the index lesion in the right iliac bone which are unchanged. No new lesions. She denies any bone pain. Appetite is good. She has had a nice recovery from her emergency surgery due to a perforated diverticulum in may 2012. She had a temporary colostomy which has now been reconnected. She has had no interim fever or infection.    Medications: reviewed  Allergies:  Allergies  Allergen Reactions  . Amiodarone     rash  . Amoxicillin Hives and Itching  . Aspirin   . Atenolol     Hair loss    Review of Systems: Constitutional: No constitutional symptoms at this time   Respiratory: No cough or dyspnea Cardiovascular:  No chest pain or palpitations Gastrointestinal: No change in bowel habit Genito-Urinary: No dysuria or frequency Musculoskeletal: See above Neurologic: No headache or change in vision Skin: No rash or ecchymosis Remaining ROS negative.  Physical Exam: Blood pressure 179/108, pulse 108, temperature 99 F (37.2 C), temperature source Oral, height 5' 2.5" (1.588 m), weight 110 lb  3.2 oz (49.986 kg). Wt Readings from Last 3 Encounters:  01/16/12 110 lb 3.2 oz (49.986 kg)  11/21/11 106 lb 1.6 oz (48.127 kg)  09/05/11 108 lb (48.988 kg)     General appearance: Frail, thin, African American woman HENNT: Pharynx no erythema or exudate Lymph nodes: No lymphadenopathy Breasts: Not examined Lungs: Clear to auscultation resonant to percussion Heart: Regular rhythm  no murmur Abdomen: Soft nontender no mass no organomegaly Extremities: No edema no calf tenderness Vascular: No cyanosis Neurologic: Motor strength is 5 over 5. Reflexes 1+ symmetric. Sensation is mildly decreased to tuning fork exam over the fingertips left greater than right Skin: No rash or ecchymosis  Lab Results: Lab Results  Component Value Date   WBC 3.1* 01/09/2012   HGB 10.8* 01/09/2012   HCT 33.0* 01/09/2012   MCV 95.3 01/09/2012   PLT 202 01/09/2012     Chemistry      Component Value Date/Time   NA 144 11/21/2011 1409   K 3.6 11/21/2011 1409   CL 105 11/21/2011 1409   CO2 29 11/21/2011 1409   BUN 18 11/21/2011 1409   CREATININE 0.84 11/21/2011 1409      Component Value Date/Time   CALCIUM 10.2 11/21/2011 1409   ALKPHOS 56 11/21/2011 1409   AST 19 11/21/2011 1409   ALT 15 11/21/2011 1409   BILITOT 1.0 11/21/2011 1409       Impression and Plan: #1. Early stage biclonal IgG and IgA multiple myeloma  Despite an increase in the lambda free light chains between July 2012 and now, remainder of her hematologic profile and serum immunoglobulin levels remain stable.   I will continue observation alone and not put her back on chemotherapy. Repeat lab again in 3 months.   #2. Perforated diverticulum. Temporary colostomy colostomy now status post colostomy closure procedure.   #3. Essential hypertension.   #4. History of a psychotic episode in the past.    CC:. Dr. Jola Schmidt; [facility]    Levert Feinstein, MD 4/19/20137:08 PM

## 2012-01-19 ENCOUNTER — Telehealth: Payer: Self-pay | Admitting: Oncology

## 2012-01-19 NOTE — Telephone Encounter (Signed)
Talked to pt gave her appt on on 7/12 lab and md on 7/19 md only

## 2012-04-05 ENCOUNTER — Other Ambulatory Visit: Payer: Self-pay | Admitting: Internal Medicine

## 2012-04-06 ENCOUNTER — Telehealth: Payer: Self-pay | Admitting: Oncology

## 2012-04-06 NOTE — Telephone Encounter (Signed)
R/s lab appt to Thursday 7/11 per pt rqst

## 2012-04-06 NOTE — Telephone Encounter (Signed)
Ok to Rf Tramadol? 

## 2012-04-08 ENCOUNTER — Other Ambulatory Visit (HOSPITAL_BASED_OUTPATIENT_CLINIC_OR_DEPARTMENT_OTHER): Payer: Medicare Other | Admitting: Lab

## 2012-04-08 DIAGNOSIS — C9000 Multiple myeloma not having achieved remission: Secondary | ICD-10-CM

## 2012-04-08 LAB — CBC WITH DIFFERENTIAL/PLATELET
Basophils Absolute: 0 10*3/uL (ref 0.0–0.1)
Eosinophils Absolute: 0 10*3/uL (ref 0.0–0.5)
HCT: 35.9 % (ref 34.8–46.6)
HGB: 11.6 g/dL (ref 11.6–15.9)
MCH: 30.5 pg (ref 25.1–34.0)
MONO#: 0.2 10*3/uL (ref 0.1–0.9)
NEUT#: 2.3 10*3/uL (ref 1.5–6.5)
NEUT%: 72.8 % (ref 38.4–76.8)
RDW: 14 % (ref 11.2–14.5)
WBC: 3.2 10*3/uL — ABNORMAL LOW (ref 3.9–10.3)
lymph#: 0.6 10*3/uL — ABNORMAL LOW (ref 0.9–3.3)

## 2012-04-08 LAB — COMPREHENSIVE METABOLIC PANEL
ALT: 14 U/L (ref 0–35)
AST: 17 U/L (ref 0–37)
Albumin: 4.1 g/dL (ref 3.5–5.2)
Alkaline Phosphatase: 62 U/L (ref 39–117)
BUN: 18 mg/dL (ref 6–23)
Potassium: 3.3 mEq/L — ABNORMAL LOW (ref 3.5–5.3)
Sodium: 139 mEq/L (ref 135–145)

## 2012-04-09 ENCOUNTER — Other Ambulatory Visit: Payer: Medicare Other | Admitting: Lab

## 2012-04-09 LAB — IGG, IGA, IGM: IgM, Serum: 18 mg/dL — ABNORMAL LOW (ref 52–322)

## 2012-04-09 LAB — KAPPA/LAMBDA LIGHT CHAINS: Kappa:Lambda Ratio: 0.06 — ABNORMAL LOW (ref 0.26–1.65)

## 2012-04-16 ENCOUNTER — Ambulatory Visit (HOSPITAL_BASED_OUTPATIENT_CLINIC_OR_DEPARTMENT_OTHER): Payer: Medicare Other | Admitting: Oncology

## 2012-04-16 VITALS — BP 168/83 | HR 108 | Temp 98.1°F | Ht 62.0 in | Wt 108.8 lb

## 2012-04-16 DIAGNOSIS — C9000 Multiple myeloma not having achieved remission: Secondary | ICD-10-CM

## 2012-04-16 NOTE — Progress Notes (Signed)
Hematology and Oncology Follow Up Visit  Victoria [name] [id number] 1932-03-11 78 y.o. 04/16/2012 5:47 PM   Principle Diagnosis: Encounter Diagnosis  Name Primary?  . MULTIPLE  MYELOMA Yes     Interim History:    Followup visit for this frail 78 year old woman diagnosed with early stage biclonal IgG and IgA multiple myeloma back in October of 2008 when she was incidentally found to have a small bone lesion in the right iliac bone seen on x-rays to evaluate abdominal pain. The lesion was confirmed by CT scan. A needle biopsy was done on 07/28/2007 and showed increased plasma cells.Additional laboratory studies Showed BUN 15, creatinine 0.8, total protein 8.3, albumin 4.1, calcium 9.8, LDH 154. Beta-2 microglobulin 1.79 (1.01-1.73). Serum immunoglobulins showed mild elevation of IgG at 2380 mg% (409-8119) and IgA 676 mg% (68-378), with suppression of IgM 43 mg% (60-263). Immunofixation electrophoresis showed monoclonal IgG lambda and monoclonal IgA kappa protein. Kappa free light chains in the serum 1.65 (0.33-1.94) and lambda free light chains 9.45 (normal 0.57-2.63), with ratio of 0.17. She had mild anemia with hemoglobin 11.6. She had no proteinuria and no monoclonal free light chains on IFE of urine. She had some vague lesions in the skull and proximal humeri on bone survey.  Bilateral posterior iliac crest bone marrow biopsies were done on 11/04/2007 and only showed a slight elevation of plasma cells of 11%.  She was started on initial treatment with oral Alkeran and prednisone. After 2 cycles low-dose thalidomide was added at 100 mg daily. She was unable to tolerate this drug due to CNS side effects.  She did achieve a nice remission on the Alkeran and prednisone which was continued for a total of 6 months. She had her last cycle of treatment in October 2009. We have been monitoring her lab studies since that time and she remains in a stable plateau.  More recently there has been an elevation of  her lambda serum free light chains up to 62.9 mg percent on 11/21/2011. However repeat value done at a short interval on April 12 was overall stable at 65.1 mg percent. Serum total IgG and IgA remain stable and both are in the normal range. Recent value on April 12 IgG 1590 IgA 328. CBC remains stable with hemoglobin was ranging 10-11 g on a chronic basis. Chronic mild leukopenia. Normal platelet count. A followup metastatic bone survey done and this February showed no new lesions. She has some spotty punched out lesions in the skull and the index lesion in the right iliac bone which are unchanged. No new lesions.  Repeat lab studies in anticipation of today's visit on July 11 shows a fall in the lambda light chains from 65 mg percent in April to 46 mg percent. Kappa lambda ratio 0.06. Of note she has always had a slight elevation of the kappa free light chain as well as the lambda. As total serum immunoglobulin is  higher than most recent value up from 1590 in April to 2120 mg percent. IgA is stable at 327 mg percent. Hematologic profile also stable with hemoglobin 11.6, white count 3200 with 73% neutrophils, and platelet count 220,000.  She's had no interim medical problems. No infections. She is having some trouble with her right hearing aid and complaining of tinnitus which goes away when she turns the device off. She denies any bone pain.  Medications: reviewed  Allergies:  Allergies  Allergen Reactions  . Amiodarone     rash  . Amoxicillin Hives and  Itching  . Aspirin   . Atenolol     Hair loss    Review of Systems: Constitutional:   No constitutional symptoms Respiratory: No cough or dyspnea Cardiovascular:  No chest pain or palpitations Gastrointestinal: No change in bowel habit Genito-Urinary: Not questioned Musculoskeletal: See above Neurologic: No headache or change in vision Skin: No rash Remaining ROS negative.  Physical Exam: Blood pressure 168/83, pulse 108, temperature  98.1 F (36.7 C), temperature source Oral, height 5\' 2"  (1.575 m), weight 108 lb 12.8 oz (49.351 kg). Wt Readings from Last 3 Encounters:  04/16/12 108 lb 12.8 oz (49.351 kg)  01/16/12 110 lb 3.2 oz (49.986 kg)  11/21/11 106 lb 1.6 oz (48.127 kg)     General appearance: Thin African American woman HENNT: Pharynx no erythema or exudate Lymph nodes: No adenopathy Breasts: Not examined Lungs: Clear to auscultation resonant to percussion Heart: Regular rhythm no murmur Abdomen: Soft nontender no mass no organomegaly Extremities: No edema no calf tenderness Vascular: No cyanosis Neurologic: Motor strength 5 over 5 reflexes 1+ symmetric Skin: No rash or ecchymosis  Lab Results: Lab Results  Component Value Date   WBC 3.2* 04/08/2012   HGB 11.6 04/08/2012   HCT 35.9 04/08/2012   MCV 94.6 04/08/2012   PLT 220 04/08/2012     Chemistry      Component Value Date/Time   NA 139 04/08/2012 1513   K 3.3* 04/08/2012 1513   CL 99 04/08/2012 1513   CO2 30 04/08/2012 1513   BUN 18 04/08/2012 1513   CREATININE 0.75 04/08/2012 1513      Component Value Date/Time   CALCIUM 10.5 04/08/2012 1513   ALKPHOS 62 04/08/2012 1513   AST 17 04/08/2012 1513   ALT 14 04/08/2012 1513   BILITOT 1.0 04/08/2012 1513       Radiological Studies: No results found.  Impression and Plan: #1. Biclonal IgG and IgA multiple myeloma. She remains in a partial remission at this time with stable hematologic profile and slightly elevated total IgG and lambda free light chains. Plan: I will continue close observation repeat lab and visit in 3 months.  #2. History of perforated diverticulum requiring emergency surgery May 2012. Temporary colostomy now reversed. No new abdominal symptoms at this time.  #3. Essential hypertension  #4. History of psychotic episode in the past.   CC:. Dr. Jola Schmidt   Levert Feinstein, MD 7/19/20135:47 PM

## 2012-04-19 ENCOUNTER — Telehealth: Payer: Self-pay | Admitting: *Deleted

## 2012-04-19 NOTE — Telephone Encounter (Signed)
gave patient appointment for 07-16-2012 starting at 2:00pm

## 2012-05-03 ENCOUNTER — Other Ambulatory Visit: Payer: Self-pay | Admitting: Internal Medicine

## 2012-06-11 ENCOUNTER — Ambulatory Visit: Payer: Medicare Other | Admitting: Internal Medicine

## 2012-06-14 ENCOUNTER — Telehealth: Payer: Self-pay | Admitting: Internal Medicine

## 2012-06-14 NOTE — Telephone Encounter (Signed)
06/14/2012:  Patient's  daughter calling about refill of  Ativan.  Patient is getting low and per daughter patient has slight panic attack when Ativan is close to running out.  Daughter trying to prevent this by having refill ready when bottle done.  Daughter says Pharmacy had sent refill request over last week.  Could office please follow-up on this?

## 2012-06-15 NOTE — Telephone Encounter (Signed)
OK to fill this prescription with additional refills x2 Thank you!  

## 2012-06-16 MED ORDER — LORAZEPAM 1 MG PO TABS: 1.0000 mg | ORAL_TABLET | Freq: Two times a day (BID) | ORAL | Status: DC | PRN

## 2012-06-16 NOTE — Telephone Encounter (Signed)
Done

## 2012-06-25 ENCOUNTER — Telehealth: Payer: Self-pay | Admitting: Oncology

## 2012-06-25 NOTE — Telephone Encounter (Signed)
Pt's daughter called and wants lab to be drawn on 10/4 since they are going to be @ the [facility] building that day, lab is now scheduled for 07/02/12

## 2012-07-02 ENCOUNTER — Other Ambulatory Visit (HOSPITAL_BASED_OUTPATIENT_CLINIC_OR_DEPARTMENT_OTHER): Payer: Medicare Other | Admitting: Lab

## 2012-07-02 ENCOUNTER — Ambulatory Visit (INDEPENDENT_AMBULATORY_CARE_PROVIDER_SITE_OTHER): Payer: Medicare Other | Admitting: Internal Medicine

## 2012-07-02 ENCOUNTER — Encounter: Payer: Self-pay | Admitting: Internal Medicine

## 2012-07-02 VITALS — BP 140/84 | HR 88 | Temp 98.9°F | Resp 16 | Wt 103.0 lb

## 2012-07-02 DIAGNOSIS — C9000 Multiple myeloma not having achieved remission: Secondary | ICD-10-CM

## 2012-07-02 DIAGNOSIS — K5720 Diverticulitis of large intestine with perforation and abscess without bleeding: Secondary | ICD-10-CM

## 2012-07-02 DIAGNOSIS — K5732 Diverticulitis of large intestine without perforation or abscess without bleeding: Secondary | ICD-10-CM

## 2012-07-02 DIAGNOSIS — R5383 Other fatigue: Secondary | ICD-10-CM

## 2012-07-02 DIAGNOSIS — F411 Generalized anxiety disorder: Secondary | ICD-10-CM

## 2012-07-02 DIAGNOSIS — I10 Essential (primary) hypertension: Secondary | ICD-10-CM

## 2012-07-02 LAB — COMPREHENSIVE METABOLIC PANEL (CC13)
AST: 16 U/L (ref 5–34)
Alkaline Phosphatase: 60 U/L (ref 40–150)
BUN: 17 mg/dL (ref 7.0–26.0)
Creatinine: 0.9 mg/dL (ref 0.6–1.1)
Potassium: 3.2 mEq/L — ABNORMAL LOW (ref 3.5–5.1)
Total Bilirubin: 1.1 mg/dL (ref 0.20–1.20)

## 2012-07-02 LAB — CBC WITH DIFFERENTIAL/PLATELET
BASO%: 0.3 % (ref 0.0–2.0)
HCT: 33.9 % — ABNORMAL LOW (ref 34.8–46.6)
MCHC: 33 g/dL (ref 31.5–36.0)
MONO#: 0.2 10*3/uL (ref 0.1–0.9)
NEUT%: 70.8 % (ref 38.4–76.8)
RBC: 3.68 10*6/uL — ABNORMAL LOW (ref 3.70–5.45)
WBC: 3.3 10*3/uL — ABNORMAL LOW (ref 3.9–10.3)
lymph#: 0.7 10*3/uL — ABNORMAL LOW (ref 0.9–3.3)

## 2012-07-02 NOTE — Assessment & Plan Note (Addendum)
Continue with current prescription therapy as reflected on the Med list. Pneumovax, flu shot advised - declined

## 2012-07-02 NOTE — Assessment & Plan Note (Signed)
Continue with current prescription therapy as reflected on the Med list.  

## 2012-07-02 NOTE — Assessment & Plan Note (Signed)
Doing well 

## 2012-07-02 NOTE — Progress Notes (Signed)
Patient ID: Victoria [name], female   DOB: 04-Sep-1932, 78 y.o.   MRN: [id number]  Subjective:    Patient ID: Victoria [name], female    DOB: Nov 02, 1931, 78 y.o.   MRN: [id number]  HPI The patient presents for a follow-up of  chronic hypertension, chronic dyslipidemia, post-op issues, controlled with medicines  Wt Readings from Last 3 Encounters:  07/02/12 103 lb (46.72 kg)  04/16/12 108 lb 12.8 oz (49.351 kg)  01/16/12 110 lb 3.2 oz (49.986 kg)    BP Readings from Last 3 Encounters:  07/02/12 140/84  04/16/12 168/83  01/16/12 179/108      Review of Systems  Constitutional: Negative for chills, activity change, appetite change, fatigue and unexpected weight change.  HENT: Negative for congestion, mouth sores and sinus pressure.   Eyes: Negative for visual disturbance.  Respiratory: Negative for cough and chest tightness.   Gastrointestinal: Negative for nausea and abdominal pain.  Genitourinary: Negative for frequency, difficulty urinating and vaginal pain.  Musculoskeletal: Negative for back pain and gait problem.  Skin: Negative for pallor and rash.  Neurological: Negative for dizziness, tremors, weakness, numbness and headaches.  Psychiatric/Behavioral: Negative for confusion and disturbed wake/sleep cycle. The patient is nervous/anxious.        Objective:   Physical Exam  Constitutional: She appears well-developed. No distress.       thin  HENT:  Head: Normocephalic.  Right Ear: External ear normal.  Left Ear: External ear normal.  Nose: Nose normal.  Mouth/Throat: Oropharynx is clear and moist.  Eyes: Conjunctivae normal are normal. Pupils are equal, round, and reactive to light. Right eye exhibits no discharge. Left eye exhibits no discharge.  Neck: Normal range of motion. Neck supple. No JVD present. No tracheal deviation present. No thyromegaly present.  Cardiovascular: Normal rate, regular rhythm and normal heart sounds.   Pulmonary/Chest: No stridor. No  respiratory distress. She has no wheezes.  Abdominal: Soft. Bowel sounds are normal. She exhibits no distension and no mass. There is no tenderness. There is no rebound and no guarding.  Musculoskeletal: She exhibits no edema and no tenderness.  Lymphadenopathy:    She has no cervical adenopathy.  Neurological: She displays normal reflexes. No cranial nerve deficit. She exhibits normal muscle tone. Coordination normal.  Skin: No rash noted. No erythema.  Psychiatric: She has a normal mood and affect. Her behavior is normal. Judgment and thought content normal.   Lab Results  Component Value Date   WBC 3.3* 07/02/2012   HGB 11.2* 07/02/2012   HCT 33.9* 07/02/2012   PLT 203 07/02/2012   GLUCOSE 95 07/02/2012   CHOL 158 03/09/2007   TRIG 37 03/09/2007   HDL 58.8 03/09/2007   LDLCALC 92 03/09/2007   ALT 11 07/02/2012   AST 16 07/02/2012   NA 140 07/02/2012   K 3.2* 07/02/2012   CL 107 07/02/2012   CREATININE 0.9 07/02/2012   BUN 17.0 07/02/2012   CO2 23 07/02/2012   TSH 1.127 02/21/2011   INR 1.19 02/21/2011   HGBA1C 5.3 03/09/2007          Assessment & Plan:

## 2012-07-05 ENCOUNTER — Telehealth: Payer: Self-pay | Admitting: Internal Medicine

## 2012-07-05 ENCOUNTER — Other Ambulatory Visit: Payer: Self-pay | Admitting: Internal Medicine

## 2012-07-05 MED ORDER — POTASSIUM CHLORIDE ER 10 MEQ PO TBCR: 10.0000 meq | EXTENDED_RELEASE_TABLET | Freq: Two times a day (BID) | ORAL | Status: DC

## 2012-07-05 NOTE — Telephone Encounter (Signed)
Misty Stanley, please, inform patient that er potass was low. Pls start klor-con 1 a day and repeat BMET in 4-6 wks Thx

## 2012-07-05 NOTE — Telephone Encounter (Signed)
Unable to reach pt/leave message. Will try again later.

## 2012-07-06 LAB — KAPPA/LAMBDA LIGHT CHAINS
Kappa free light chain: 1.99 mg/dL — ABNORMAL HIGH (ref 0.33–1.94)
Lambda Free Lght Chn: 32.3 mg/dL — ABNORMAL HIGH (ref 0.57–2.63)

## 2012-07-06 LAB — IMMUNOFIXATION ELECTROPHORESIS
IgA: 267 mg/dL (ref 69–380)
IgM, Serum: 18 mg/dL — ABNORMAL LOW (ref 52–322)

## 2012-07-07 ENCOUNTER — Other Ambulatory Visit: Payer: Self-pay | Admitting: *Deleted

## 2012-07-07 MED ORDER — POTASSIUM CHLORIDE ER 10 MEQ PO TBCR: 10.0000 meq | EXTENDED_RELEASE_TABLET | Freq: Two times a day (BID) | ORAL | Status: DC

## 2012-07-07 NOTE — Telephone Encounter (Signed)
Left detailed mess informing  Revonda Standard of below.

## 2012-07-16 ENCOUNTER — Ambulatory Visit (HOSPITAL_BASED_OUTPATIENT_CLINIC_OR_DEPARTMENT_OTHER): Payer: Medicare Other | Admitting: Oncology

## 2012-07-16 ENCOUNTER — Telehealth: Payer: Self-pay | Admitting: Oncology

## 2012-07-16 ENCOUNTER — Other Ambulatory Visit: Payer: Medicare Other | Admitting: Lab

## 2012-07-16 VITALS — BP 149/80 | HR 104 | Temp 98.2°F | Resp 18 | Ht 62.0 in | Wt 104.8 lb

## 2012-07-16 DIAGNOSIS — C9000 Multiple myeloma not having achieved remission: Secondary | ICD-10-CM

## 2012-07-16 NOTE — Progress Notes (Signed)
Hematology and Oncology Follow Up Visit  Victoria [name] [id number] [birth year] 78 y.o. 07/16/2012 6:25 PM   Principle Diagnosis: Encounter Diagnosis  Name Primary?  . MULTIPLE  MYELOMA Yes     Interim History:   Followup visit for this frail 78 year old woman diagnosed with early stage biclonal IgG and IgA multiple myeloma back in October of 2008 when she was incidentally found to have a small bone lesion in the right iliac bone seen on x-rays to evaluate abdominal pain. The lesion was confirmed by CT scan. A needle biopsy was done on 07/28/2007 and showed increased plasma cells.Additional laboratory studies Showed BUN 15, creatinine 0.8, total protein 8.3, albumin 4.1, calcium 9.8, LDH 154. Beta-2 microglobulin 1.79 (1.01-1.73). Serum immunoglobulins showed mild elevation of IgG at 2380 mg% (409-8119) and IgA 676 mg% (68-378), with suppression of IgM 43 mg% (60-263). Immunofixation electrophoresis showed monoclonal IgG lambda and monoclonal IgA kappa protein. Kappa free light chains in the serum 1.65 (0.33-1.94) and lambda free light chains 9.45 (normal 0.57-2.63), with ratio of 0.17. She had mild anemia with hemoglobin 11.6. She had no proteinuria and no monoclonal free light chains on IFE of urine. She had some vague lesions in the skull and proximal humeri on bone survey.  Bilateral posterior iliac crest bone marrow biopsies were done on 11/04/2007 and only showed a slight elevation of plasma cells of 11%.  She was started on initial treatment with oral Alkeran and prednisone. After 2 cycles low-dose thalidomide was added at 100 mg daily. She was unable to tolerate this drug due to CNS side effects.  She did achieve a nice remission on the Alkeran and prednisone which was continued for a total of 6 months. She had her last cycle of treatment in October 2009. We have been monitoring her lab studies since that time and she remains in a stable plateau.   Recently there has been an elevation of her  lambda serum free light chains up to 62.9 mg percent on 11/21/2011. However repeat values done since that time are of falling with most recent IV done in anticipation of today's visit on October 4, down to 32.3 mg percent. Although there was a trend for rise in her IgG immunoglobulin on a July 11 study ( 2120 mg%), there is been no further elevation with most recent value on October 4  identical to the July value. IgA remains in normal range at 267 mg percent with ongoing suppression of IgM at 18 mg percent. There is still presence of monoclonal IgG lambda and IgA kappa on the serum  IFE.  She remains asymptomatic and denies any new bone pain. She has had no interim infections. She is still getting some lower abdominal discomfort on an intermittent basis related to previous bowel surgery.   Medications: reviewed  Allergies:  Allergies  Allergen Reactions  . Amiodarone     rash  . Amoxicillin Hives and Itching  . Aspirin   . Atenolol     Hair loss    Review of Systems: Constitutional:   Appetite is better and she is gaining some weight Respiratory: No cough or dyspnea Cardiovascular:  No chest pain or palpitations Gastrointestinal: See above Genito-Urinary: No urinary tract symptoms Musculoskeletal: See above Neurologic: No change in vision Skin: No rash Remaining ROS negative.  Physical Exam: Blood pressure 149/80, pulse 104, temperature 98.2 F (36.8 C), temperature source Oral, resp. rate 18, height 5\' 2"  (1.575 m), weight 104 lb 12.8 oz (47.537 kg). Wt Readings from  Last 3 Encounters:  07/16/12 104 lb 12.8 oz (47.537 kg)  07/02/12 103 lb (46.72 kg)  04/16/12 108 lb 12.8 oz (49.351 kg)     General appearance: Frail, African American woman HENNT: Pharynx no erythema or exudate Lymph nodes: No adenopathy Breasts: Not examined Lungs: Clear to auscultation resonant to percussion Heart: Regular rhythm no murmur Abdomen: Soft, nontender, no mass, no organomegaly Extremities:  No edema, no calf tenderness Vascular: No cyanosis Neurologic: Motor strength 5 over 5, reflexes 1+ symmetric, sensation intact to vibration over the fingertips by tuning fork exam Skin: No rash or ecchymosis  Lab Results: Lab Results  Component Value Date   WBC 3.3* 07/02/2012   HGB 11.2* 07/02/2012   HCT 33.9* 07/02/2012   MCV 92.1 07/02/2012   PLT 203 07/02/2012     Chemistry      Component Value Date/Time   NA 140 07/02/2012 1410   NA 139 04/08/2012 1513   K 3.2* 07/02/2012 1410   K 3.3* 04/08/2012 1513   CL 107 07/02/2012 1410   CL 99 04/08/2012 1513   CO2 23 07/02/2012 1410   CO2 30 04/08/2012 1513   BUN 17.0 07/02/2012 1410   BUN 18 04/08/2012 1513   CREATININE 0.9 07/02/2012 1410   CREATININE 0.75 04/08/2012 1513      Component Value Date/Time   CALCIUM 10.3 07/02/2012 1410   CALCIUM 10.5 04/08/2012 1513   ALKPHOS 60 07/02/2012 1410   ALKPHOS 62 04/08/2012 1513   AST 16 07/02/2012 1410   AST 17 04/08/2012 1513   ALT 11 07/02/2012 1410   ALT 14 04/08/2012 1513   BILITOT 1.10 07/02/2012 1410   BILITOT 1.0 04/08/2012 1513       Impression and Plan: #1. Biclonal IgG and IgA multiple myeloma. She  at a remarkably 5 years from diagnosis and only received 6 months of initial treatment. Hematologic parameters remained stable. She has never developed any progressive bone lesions. Plan: Continue intermittent followup. We are checking labs and exams every 3 months.  #2. History of perforated diverticulum requiring emergency surgery May 2012. Temporary colostomy now reversed. No new abdominal symptoms at this time.   #3. Essential hypertension   #4. History of psychotic episode in the past.     CC:. Dr. Jola Schmidt   Levert Feinstein, MD 10/18/20136:25 PM

## 2012-07-16 NOTE — Telephone Encounter (Signed)
Gave pt appt for January 2014 lab and Bone survey, per Deidra no appt needed, then pt will see MD on 10/29/12

## 2012-08-09 ENCOUNTER — Telehealth: Payer: Self-pay | Admitting: Internal Medicine

## 2012-08-09 NOTE — Telephone Encounter (Signed)
Left message for pt to call back.  Pts daughter states that her mother is having lots of diarrhea. Have talked to her PCP and tried the brat diet, it has not helped. Pt woke up this am in a "pool of diarrhea." Pts daughter requesting to be seen sooner than 1st available. Pt scheduled to see Willette Cluster NP tomorrow at 3pm. Pt aware of appt date and time.

## 2012-08-10 ENCOUNTER — Ambulatory Visit: Payer: Medicare Other | Admitting: Nurse Practitioner

## 2012-08-11 ENCOUNTER — Other Ambulatory Visit: Payer: Medicare Other

## 2012-08-11 ENCOUNTER — Ambulatory Visit (INDEPENDENT_AMBULATORY_CARE_PROVIDER_SITE_OTHER): Payer: Medicare Other | Admitting: Nurse Practitioner

## 2012-08-11 ENCOUNTER — Encounter: Payer: Self-pay | Admitting: *Deleted

## 2012-08-11 VITALS — BP 112/60 | HR 99 | Ht 61.0 in | Wt 103.6 lb

## 2012-08-11 DIAGNOSIS — R197 Diarrhea, unspecified: Secondary | ICD-10-CM | POA: Insufficient documentation

## 2012-08-11 NOTE — Patient Instructions (Addendum)
Please go to the basement level to the lab for stool studies. Hold the evening fiber for now. We will call you with the results of the stool studies once we have them.

## 2012-08-11 NOTE — Progress Notes (Signed)
08/11/2012 Victoria [name] [id number] [birth year]   History of Present Illness:  Patient is a 78 year old female known to Dr. Juanda [name]. She has a history of diverticular disease, status post sigmoid resection May 2012 for diverticulitis. Her takedown was in September 2012. Her BMs were normal following take down until early October when she began to have intermittent loose stools, especially at night. Now having frequent episodes of diarrhea but still mainly just in the evenings at a night. She wakes up to defecate and has been incontinent of stool during her sleep. Nighttime stool  is very watery, no blood or mucous. She does has some normal BMs during the day.  No medication or dietary changes. Her weight is stable. She is taking fiber supplements after dinner. Patient's daughter is with her today. She helps provide history but she and patient don't agree on some of the details regarding bowel changes.   Current Medications, Allergies, Past Medical History, Past Surgical History, Family History and Social History were reviewed in [employer] record.   Physical Exam: General: pleasant well developed , black female in no acute distress Head: Normocephalic and atraumatic Eyes:  sclerae anicteric, conjunctiva pink  Ears: Normal auditory acuity Lungs: Clear throughout to auscultation Heart: Regular rate and rhythm Abdomen: Soft, non tender and non distended. No masses, no hepatomegaly. Normal bowel sounds Rectal: Soft stool in vault, no impaction. Adequate rectal tone. Musculoskeletal: Symmetrical with no gross deformities  Extremities: No edema  Neurological: Alert oriented x 4, grossly nonfocal Psychological:  Alert and cooperative. Normal mood and affect  Assessment and Recommendations: 1. pleasant 78 year old female with recent bowel changes. Over the last several weeks patient has been having loose stool in the evening associated with some incontinence in her sleep. She  apparently has somewhat normal bowel movements during the day. No abdominal pain. Doubt infectious since having normal bowel movements during daytime hours but still need to exclude infectious process. Will check stool studies. Patient takes fiber in the evenings, perhaps this is causing the loose stools at night. I did recommend she hold evening fiber to see if this makes a difference. I don't think this is overflow diarrhea since again, she's having normal bowel movements during the day. Her daughter is in the healthcare field. We discussed options for evaluation if bowel movements don't normalize after discontinuation of fiber / stool studies are negative. Will call patient with stool study results when they are available.  2. history of perforated diverticulum May 2012. She had take down surgery September 2012.  3. multiple myeloma diagnosed 5 years ago. She is followed by Dr. Cyndie [name]. Apparently in remission after only 6 months of initial treatment

## 2012-08-12 ENCOUNTER — Other Ambulatory Visit: Payer: Medicare Other

## 2012-08-12 ENCOUNTER — Encounter: Payer: Self-pay | Admitting: Nurse Practitioner

## 2012-08-12 DIAGNOSIS — R197 Diarrhea, unspecified: Secondary | ICD-10-CM

## 2012-08-13 LAB — FECAL LACTOFERRIN, QUANT: Lactoferrin: NEGATIVE

## 2012-08-13 LAB — FECAL FAT QUALITATIVE
Free Fatty Acids: NORMAL
NEUTRAL FAT: NORMAL

## 2012-08-13 LAB — OVA AND PARASITE SCREEN

## 2012-08-18 NOTE — Progress Notes (Signed)
Reviewed, agree 

## 2012-08-19 ENCOUNTER — Telehealth: Payer: Self-pay | Admitting: *Deleted

## 2012-08-19 DIAGNOSIS — R197 Diarrhea, unspecified: Secondary | ICD-10-CM

## 2012-08-19 NOTE — Telephone Encounter (Signed)
Left a message for patient to call me. 

## 2012-08-19 NOTE — Telephone Encounter (Signed)
Message copied by Daphine Deutscher on Thu Aug 19, 2012  9:50 AM ------      Message from: Meredith Pel      Created: Wed Aug 18, 2012  5:31 PM       Rene Kocher, amy got her stool study results in error. Now I can't document on them. Do you know how I can write on the actual report now?  Also, will you call and let her know that all stool studies were normal. Is she better after holding fiber? Thanks

## 2012-08-20 NOTE — Telephone Encounter (Signed)
Per Willette Cluster, NP, get KUB to verify patient does not constipation. Patient's daughter notified.

## 2012-08-20 NOTE — Telephone Encounter (Signed)
Left a message for patient to call me. Patient given results. She states the diarrhea is some better but she is still having it at night.

## 2012-08-20 NOTE — Telephone Encounter (Signed)
Per Willette Cluster, NP discuss with Dr. Juanda Chance and let patient know what next step will be. May take Imodium prn. Daughter notified. Patient's daughter states the patient is having some episodes during the day now.

## 2012-08-25 ENCOUNTER — Telehealth: Payer: Self-pay | Admitting: *Deleted

## 2012-08-25 NOTE — Telephone Encounter (Signed)
Patient has not gotten the KUB. Attempted to reach patient but unable to reach her by home phone. Reached patients son on cell number. She is asleep. Will try again later.

## 2012-08-25 NOTE — Telephone Encounter (Signed)
Message copied by Daphine Deutscher on Wed Aug 25, 2012  8:52 AM ------      Message from: Daphine Deutscher      Created: Mon Aug 23, 2012  8:19 AM       Did pt bet KUB for PG

## 2012-08-30 NOTE — Telephone Encounter (Signed)
Spoke with patient's daughter and she will try to get in this week. Patient has been have solid stool then diarrhea off and on. Patient's daughter wanted to be sure the KUB did not have "contrast." Explained that KUB was 2 view abdominal xray.

## 2012-09-02 NOTE — Telephone Encounter (Signed)
Victoria [name], did patient say whether or not she would get the KUB done? Takes

## 2012-09-03 NOTE — Telephone Encounter (Signed)
See phone note on 08/25/12.

## 2012-09-10 ENCOUNTER — Ambulatory Visit (HOSPITAL_COMMUNITY)
Admission: RE | Admit: 2012-09-10 | Discharge: 2012-09-10 | Disposition: A | Discharge status: Home / Self Care | Payer: Medicare Other | Source: Ambulatory Visit | Attending: Oncology | Admitting: Oncology

## 2012-09-10 ENCOUNTER — Encounter: Payer: Self-pay | Admitting: *Deleted

## 2012-09-10 DIAGNOSIS — I700 Atherosclerosis of aorta: Secondary | ICD-10-CM | POA: Insufficient documentation

## 2012-09-10 DIAGNOSIS — C9000 Multiple myeloma not having achieved remission: Secondary | ICD-10-CM | POA: Insufficient documentation

## 2012-09-10 DIAGNOSIS — M899 Disorder of bone, unspecified: Secondary | ICD-10-CM | POA: Insufficient documentation

## 2012-09-21 ENCOUNTER — Telehealth: Payer: Self-pay | Admitting: *Deleted

## 2012-09-21 NOTE — Telephone Encounter (Signed)
Dtr. Jeanene Erb for results of bone scan done 09/10/12.   Let her know that results are on Dr. Patsy Lager desk for him to review when he returns next week.  (Next  MD visit is 10-29-12).  Dtr.-[name] Frampton  Requested call back,  [phone], when he has had a chance to review results.  She understands he is out of the office this week.

## 2012-10-01 ENCOUNTER — Telehealth: Payer: Self-pay | Admitting: *Deleted

## 2012-10-01 NOTE — Telephone Encounter (Signed)
Pt's daughter called for report of bone survey.  Message left yest to return call.  Informed that bone xrays stable & no new abnormalities per Dr Cyndie Chime.  Daughter, Amalia Hailey. Copy of report & OK by Dr.Granfortuna. Mailed copy to home address.

## 2012-10-13 ENCOUNTER — Encounter: Payer: Self-pay | Admitting: Internal Medicine

## 2012-10-13 ENCOUNTER — Ambulatory Visit (INDEPENDENT_AMBULATORY_CARE_PROVIDER_SITE_OTHER): Payer: 59 | Admitting: Internal Medicine

## 2012-10-13 VITALS — BP 146/74 | HR 76 | Ht 61.0 in | Wt 103.0 lb

## 2012-10-13 DIAGNOSIS — IMO0002 Reserved for concepts with insufficient information to code with codable children: Secondary | ICD-10-CM

## 2012-10-13 DIAGNOSIS — R627 Adult failure to thrive: Secondary | ICD-10-CM

## 2012-10-13 DIAGNOSIS — R6251 Failure to thrive (child): Secondary | ICD-10-CM

## 2012-10-13 DIAGNOSIS — K589 Irritable bowel syndrome without diarrhea: Secondary | ICD-10-CM

## 2012-10-13 MED ORDER — SERTRALINE HCL 25 MG PO TABS: 25.0000 mg | ORAL_TABLET | Freq: Every day | ORAL | Status: DC

## 2012-10-13 MED ORDER — DICYCLOMINE HCL 10 MG PO CAPS: 10.0000 mg | ORAL_CAPSULE | Freq: Two times a day (BID) | ORAL | Status: DC

## 2012-10-13 NOTE — Progress Notes (Signed)
Victoria [name] December 04, 1931 MRN [id number]   History of Present Illness:  This is a 79 year old African American female with a history of diverticulitis and diverticular abscess necessitating exploratory laparotomy, segmental colon resection and diverting colostomy which was taken down in September 2010. She has multiple myeloma. She saw Willette Cluster, NP in October with complaints of change in bowel habits, fecal  urgency and incontinence. Her stool studies included C. difficile lactoferrin and fecal fat and were all negative. She is doing better but still having some urgency. Her last colonoscopy in July 2012 was done prior to takedown of the colostomy and showed diverticulosis. Prior colonoscopies were in 2002 and 2006. She has failure to thrive. She has failed to gain weight being currently at 103 pounds. In October 2012 her weight was 108 pounds.   Past Medical History  Diagnosis Date  . Hypertension   . Insomnia   . Anxiety   . Hearing loss     bilateral hearing aids  . Anemia   . Multiple myeloma(203.0)   . Multiple myeloma(203.0)   . Diverticulosis 04/2001   Past Surgical History  Procedure Date  . Sigmoid resection / rectopexy 01/2011    perforation/stoma  . Colonoscopy   . Partial hysterectomy   . Cesarean section   . Colostomy takedown 06/27/11  . Cholecystectomy 2012    laparoscopic    reports that she has never smoked. She has never used smokeless tobacco. She reports that she does not drink alcohol or use illicit drugs. family history includes Hypertension in her other and Prostate cancer in her father.  There is no history of Colitis, and Esophageal cancer, and Stomach cancer, . Allergies  Allergen Reactions  . Amiodarone     rash  . Amoxicillin Hives and Itching  . Aspirin   . Atenolol     Hair loss        Review of Systems: Denies dysphagia nausea vomiting or rectal bleeding  The remainder of the 10 point ROS is negative except as outlined in  H&P   Physical Exam: General appearance thin and frail appearing, in no distress. Eyes- non icteric. HEENT nontraumatic, normocephalic. Mouth no lesions, tongue papillated, no cheilosis. Neck supple without adenopathy, thyroid not enlarged, no carotid bruits, no JVD. Lungs Clear to auscultation bilaterally. Cor normal S1, normal S2, regular rhythm, no murmur,  quiet precordium. Abdomen: Scaphoid abdomen with normal active bowel sounds. No distention. No tenderness. Well-healed surgical scar. Rectal: Soft Hemoccult negative stool. Extremities no pedal edema. Skin no lesions. Neurological alert and oriented x 3. Psychological normal mood and affect.  Assessment and Plan:  Problem #1 Failure to thrive and failure to gain weight since sigmoid resection 3 years ago. Patient has lost 5 pounds since her last appointment in October. We will start Bentyl 10 mg twice a day for spastic colon and also Zoloft 25 mg at bedtime. If there is no improvement in the next 2 or 3 months, we will consider using Megace and increasing her Zoloft to 50 mg daily.   10/13/2012 Victoria [name]

## 2012-10-13 NOTE — Patient Instructions (Addendum)
We have sent the following medications to your pharmacy for you to pick up at your convenience: Bentyl twice daily Zoloft 25 mg daily  CC: Dr Posey Rea

## 2012-10-15 ENCOUNTER — Other Ambulatory Visit: Payer: Self-pay | Admitting: *Deleted

## 2012-10-15 MED ORDER — HYDROCHLOROTHIAZIDE 25 MG PO TABS: 25.0000 mg | ORAL_TABLET | Freq: Every day | ORAL | Status: DC

## 2012-10-22 ENCOUNTER — Other Ambulatory Visit (HOSPITAL_BASED_OUTPATIENT_CLINIC_OR_DEPARTMENT_OTHER): Payer: Medicare Other | Admitting: Lab

## 2012-10-22 ENCOUNTER — Other Ambulatory Visit (HOSPITAL_COMMUNITY): Payer: Medicare Other

## 2012-10-22 DIAGNOSIS — C9000 Multiple myeloma not having achieved remission: Secondary | ICD-10-CM

## 2012-10-22 LAB — COMPREHENSIVE METABOLIC PANEL (CC13)
ALT: 12 U/L (ref 0–55)
AST: 19 U/L (ref 5–34)
BUN: 14.4 mg/dL (ref 7.0–26.0)
CO2: 29 mEq/L (ref 22–29)
Creatinine: 1 mg/dL (ref 0.6–1.1)
Total Bilirubin: 1.06 mg/dL (ref 0.20–1.20)

## 2012-10-22 LAB — CBC WITH DIFFERENTIAL/PLATELET
BASO%: 0.7 % (ref 0.0–2.0)
LYMPH%: 16.7 % (ref 14.0–49.7)
MCH: 31.1 pg (ref 25.1–34.0)
MCHC: 32 g/dL (ref 31.5–36.0)
MCV: 97 fL (ref 79.5–101.0)
MONO%: 6.5 % (ref 0.0–14.0)
NEUT%: 75.8 % (ref 38.4–76.8)
Platelets: 226 10*3/uL (ref 145–400)
RBC: 3.7 10*6/uL (ref 3.70–5.45)
nRBC: 0 % (ref 0–0)

## 2012-10-26 LAB — KAPPA/LAMBDA LIGHT CHAINS: Kappa:Lambda Ratio: 0.03 — ABNORMAL LOW (ref 0.26–1.65)

## 2012-10-26 LAB — IMMUNOFIXATION ELECTROPHORESIS
IgA: 304 mg/dL (ref 69–380)
IgM, Serum: 21 mg/dL — ABNORMAL LOW (ref 52–322)
Total Protein, Serum Electrophoresis: 8.5 g/dL — ABNORMAL HIGH (ref 6.0–8.3)

## 2012-10-29 ENCOUNTER — Other Ambulatory Visit: Payer: Self-pay | Admitting: *Deleted

## 2012-10-29 ENCOUNTER — Other Ambulatory Visit: Payer: Self-pay | Admitting: Internal Medicine

## 2012-10-29 ENCOUNTER — Ambulatory Visit (HOSPITAL_BASED_OUTPATIENT_CLINIC_OR_DEPARTMENT_OTHER): Payer: Medicare Other | Admitting: Oncology

## 2012-10-29 VITALS — BP 150/83 | HR 104 | Temp 98.8°F | Resp 20 | Ht 62.0 in | Wt 99.2 lb

## 2012-10-29 DIAGNOSIS — C9000 Multiple myeloma not having achieved remission: Secondary | ICD-10-CM

## 2012-10-29 MED ORDER — POMALIDOMIDE 2 MG PO CAPS: 2.0000 mg | ORAL_CAPSULE | Freq: Every day | ORAL | Status: DC

## 2012-10-29 MED ORDER — DEXAMETHASONE 4 MG PO TABS: ORAL_TABLET | ORAL | Status: DC

## 2012-10-29 NOTE — Patient Instructions (Signed)
Start pomalidamide 2 mg 1 daily for 21 days then 7 day rest dexsamethasone 4 mg  5 tablest after breakfast once a week no rest Lab on 2/14 Lab and MD vist 2/28

## 2012-11-01 NOTE — Progress Notes (Signed)
Hematology and Oncology Follow Up Visit  Victoria [name] [id number] 12/09/1931 79 y.o. 11/01/2012 11:19 AM   Principle Diagnosis: Encounter Diagnosis  Name Primary?  . MULTIPLE  MYELOMA Yes     Interim History:   Emotional visit for this frail 79 year old woman diagnosed with early stage biclonal IgG and IgA multiple myeloma back in October of 2008 when she was incidentally found to have a small bone lesion in the right iliac bone seen on x-rays to evaluate abdominal pain. The lesion was confirmed by CT scan. A needle biopsy was done on 07/28/2007 and showed increased plasma cells.Additional laboratory studies Showed BUN 15, creatinine 0.8, total protein 8.3, albumin 4.1, calcium 9.8, LDH 154. Beta-2 microglobulin 1.79 (1.01-1.73). Serum immunoglobulins showed mild elevation of IgG at 2380 mg% (409-8119) and IgA 676 mg% (68-378), with suppression of IgM 43 mg% (60-263). Immunofixation electrophoresis showed monoclonal IgG lambda and monoclonal IgA kappa protein. Kappa free light chains in the serum 1.65 (0.33-1.94) and lambda free light chains 9.45 (normal 0.57-2.63), with ratio of 0.17. She had mild anemia with hemoglobin 11.6. She had no proteinuria and no monoclonal free light chains on IFE of urine. She had some vague lesions in the skull and proximal humeri on bone survey.  Bilateral posterior iliac crest bone marrow biopsies were done on 11/04/2007 and only showed a slight elevation of plasma cells of 11%.  She was started on initial treatment with oral Alkeran and prednisone. After 2 cycles, low-dose thalidomide was added at 100 mg daily. She was unable to tolerate this drug due to CNS side effects.  She did achieve a nice remission on the Alkeran and prednisone which was continued for a total of 6 months. She had her last cycle of treatment in October 2009. We have been monitoring her lab studies since that time and she remained in a stable plateau until now.  Recent immunoglobulin studies now  showing progressive increase in serum lambda free light chains which had risen back in February 2013 to 62.9 mg percent but then without any specific intervention fell down to 32.3 mg percent on 07/02/2012. Most recent value now up again to 76.8 mg percent on 10/22/2012.Serum total IgA remains stable at 304 mg percent but there is a slow rise in IgG since April 2013 when the value was 1590 mg percent, rose to 2.1 g by July, stable at 2.1 g in October, but now trending upward at 2540 mg% as of January 24 of this year. Hematologic profile remained stable with total white count running around 3000 with 75% neutrophils, hemoglobin stable at 11.5 g, and platelets of 226,000. She has never had any bone pain and does not have any to report at this time.   Allergies:  Allergies  Allergen Reactions  . Amiodarone     rash  . Amoxicillin Hives and Itching  . Aspirin   . Atenolol     Hair loss    Review of Systems: Constitutional:   No constitutional symptoms Respiratory:no cough or dyspnea Cardiovascular:  No chest pain or palpitations Gastrointestinal:no abdominal pain or change in bowel habit Genito-Urinary: no urinary tract symptoms Musculoskeletal:no bone pain Neurologic:no new neurologic complaints Skin:no rash or ecchymosis Remaining ROS negative.  Physical Exam: Blood pressure 150/83, pulse 104, temperature 98.8 F (37.1 C), temperature source Oral, resp. rate 20, height 5\' 2"  (1.575 m), weight 99 lb 3.2 oz (44.997 kg). Wt Readings from Last 3 Encounters:  10/29/12 99 lb 3.2 oz (44.997 kg)  10/13/12 103 lb (  46.72 kg)  08/11/12 103 lb 9.6 oz (46.993 kg)     General appearance: frail African American woman HENNT: bilateral conjunctival irritation, pharynx no erythema or exudate Lymph nodes: no adenopathy Breasts: Lungs:clear to auscultation resonant to percussion Heart:regular rhythm no murmur Abdomen:soft, nontender, no mass, no organomegaly Extremities:no edema, no calf  tenderness Vascular:no cyanosis Neurologic:motor strength 5 over 5, reflexes 1+ symmetric, sensation intact to vibration over the fingertips by tuning fork exam Skin:no rash or ecchymosis  Lab Results: Lab Results  Component Value Date   WBC 2.9* 10/22/2012   HGB 11.5* 10/22/2012   HCT 35.9 10/22/2012   MCV 97.0 10/22/2012   PLT 226 10/22/2012     Chemistry      Component Value Date/Time   NA 142 10/22/2012 1457   NA 139 04/08/2012 1513   K 3.9 10/22/2012 1457   K 3.3* 04/08/2012 1513   CL 102 10/22/2012 1457   CL 99 04/08/2012 1513   CO2 29 10/22/2012 1457   CO2 30 04/08/2012 1513   BUN 14.4 10/22/2012 1457   BUN 18 04/08/2012 1513   CREATININE 1.0 10/22/2012 1457   CREATININE 0.75 04/08/2012 1513      Component Value Date/Time   CALCIUM 10.8* 10/22/2012 1457   CALCIUM 10.5 04/08/2012 1513   ALKPHOS 69 10/22/2012 1457   ALKPHOS 62 04/08/2012 1513   AST 19 10/22/2012 1457   AST 17 04/08/2012 1513   ALT 12 10/22/2012 1457   ALT 14 04/08/2012 1513   BILITOT 1.06 10/22/2012 1457   BILITOT 1.0 04/08/2012 1513       Impression and Plan: #1. Biclonal IgG A. and IgG lambda multiple myeloma I would like to start her back on a treatment program. The data presented at recent American Society of hematology meetings in December 2013 with respect to elderly patients who are not transplant candidates, has given some new insights into optimal treatment of older patients. Doublet treatment with a third-generation Imid, Pomalidomide plus low-dose dexamethasone gave superior results to a triplet regimen including melphalan, prednisone, and thalidomide.Pomalidomide low-dose dexamethasone also active and less toxic than high-dose dexamethasone alone. Pomalidomide is better tolerated than thalidomide and has less chance of causing  neuropathy than Revlimid. However, in view of her negative experience with the thalidomide ,I'm going to start the pomalidomide at a 50% dose reduction:  2 mg daily 3 weeks on one week  rest with dexamethasone 20 mg weekly no rest .I reviewed the treatment plan with her and her daughter and gave them written information about the drug. Main side effect profile seen with the Imids has been variable degree of pancytopenia and increased frequency of infections. We are also starting to see a second primary malignancies.  #2. History of perforated diverticulum requiring emergency surgery May 2012. Temporary colostomy now reversed. No new abdominal symptoms at this time.  #3. Essential hypertension  #4. History of psychotic episode in the past.    CC:. Dr. Lucianne Lei   Levert Feinstein, MD 2/3/201411:19 AM

## 2012-11-02 ENCOUNTER — Encounter: Payer: Self-pay | Admitting: Oncology

## 2012-11-02 NOTE — Progress Notes (Signed)
Faxed everything to Ellis Savage @ [employer] [phone] and her phone number is [phone] ext [phone].

## 2012-11-05 ENCOUNTER — Other Ambulatory Visit: Payer: Self-pay | Admitting: *Deleted

## 2012-11-06 ENCOUNTER — Other Ambulatory Visit: Payer: Self-pay | Admitting: Internal Medicine

## 2012-11-12 ENCOUNTER — Telehealth: Payer: Self-pay | Admitting: *Deleted

## 2012-11-12 NOTE — Telephone Encounter (Signed)
Called and left message.  They requested pts. Diagnosis in case she requests assistance with her pomalyst.  She has a $100 co-pay thru her [facility] [employer] [facility].  Forwarded message to [facility] so she would be aware.

## 2012-11-15 ENCOUNTER — Telehealth: Payer: Self-pay | Admitting: Internal Medicine

## 2012-11-15 NOTE — Telephone Encounter (Signed)
Pt's daughter Jill Side) req refill for ATIVAN, pt's daughter stated that its need to be approve from the doctor. Please call Jill Side if this is ok or with any question.

## 2012-11-16 ENCOUNTER — Other Ambulatory Visit: Payer: Self-pay | Admitting: *Deleted

## 2012-11-16 MED ORDER — LORAZEPAM 1 MG PO TABS: 1.0000 mg | ORAL_TABLET | Freq: Two times a day (BID) | ORAL | Status: DC | PRN

## 2012-11-16 NOTE — Telephone Encounter (Signed)
OK to fill this prescription with additional refills x5 Thank you!  

## 2012-11-16 NOTE — Telephone Encounter (Signed)
Done. Left detailed mess informing pt of below.   

## 2012-11-16 NOTE — Telephone Encounter (Signed)
Received call from [employer] stating that pt's script was sent to [facility] & supposedly filled 11/12/12.  Called pt's daughter, Revonda Standard & she reports that pomalyst has not been received as of yet & she will call to check on delivery & let us know if drug received & when she starts it.

## 2012-11-18 ENCOUNTER — Telehealth: Payer: Self-pay | Admitting: Oncology

## 2012-11-18 NOTE — Telephone Encounter (Signed)
Talked to pt's daughter gave her MD and lab appt for February scheduled for 2/25 ,original POF 2/28, MD on call day., she refused appt, notified MD and asked for another MD

## 2012-11-19 ENCOUNTER — Telehealth: Payer: Self-pay | Admitting: *Deleted

## 2012-11-19 ENCOUNTER — Other Ambulatory Visit: Payer: Medicare Other

## 2012-11-19 NOTE — Telephone Encounter (Signed)
Spoke with dtr., scheduler Rose called her last night to r/s appt. To 2/25.  They did not even get her medicine until this morning, so she does not think they  Should see Dr.Granfortuna on the 2/25 as she will not have been on it very long.   Dr. Cyndie Chime is out of the office today.  Will discuss with him on Monday and see when he wants to see her based on the fact that she is not starting the pomalyst until today.

## 2012-11-22 ENCOUNTER — Other Ambulatory Visit: Payer: Self-pay | Admitting: Oncology

## 2012-11-22 ENCOUNTER — Telehealth: Payer: Self-pay | Admitting: Dietician

## 2012-11-22 DIAGNOSIS — C9000 Multiple myeloma not having achieved remission: Secondary | ICD-10-CM

## 2012-11-22 NOTE — Telephone Encounter (Signed)
Patient has been identified to be at risk on the malnutrition screen.    Wt Readings from Last 10 Encounters:  10/29/12 99 lb 3.2 oz (44.997 kg)  10/13/12 103 lb (46.72 kg)  08/11/12 103 lb 9.6 oz (46.993 kg)  07/16/12 104 lb 12.8 oz (47.537 kg)  07/02/12 103 lb (46.72 kg)  04/16/12 108 lb 12.8 oz (49.351 kg)  01/16/12 110 lb 3.2 oz (49.986 kg)  11/21/11 106 lb 1.6 oz (48.127 kg)  09/05/11 108 lb (48.988 kg)  07/29/11 108 lb (48.988 kg)    Chart reviewed.  Weight has been decreasing.  Left message with patient to call RD for any questions.  Oran Rein, RD, LDN

## 2012-11-23 ENCOUNTER — Telehealth: Payer: Self-pay | Admitting: Oncology

## 2012-11-23 ENCOUNTER — Telehealth: Payer: Self-pay | Admitting: *Deleted

## 2012-11-23 ENCOUNTER — Ambulatory Visit: Payer: Medicare Other | Admitting: Oncology

## 2012-11-23 NOTE — Telephone Encounter (Signed)
S/w pt's dtr re appt for 3/28 and 4/4. Lb schedule for 3/24 due to per dtr appt needs to be on Friday.

## 2012-11-23 NOTE — Telephone Encounter (Signed)
Received call from daughter Revonda Standard wanting to clarify on the steroids dose for pt.  Called Revonda Standard back and informed her re:  Per Dr. Patsy Lager notes, pt to take Dexamethasone 20 mg weekly  No rest.   Per Revonda Standard,  Pt has just started taking Pomalyst 2 mg daily on  Sun. 2/23,  Mon 2/24,  And  Will take today at bedtime.   Revonda Standard stated pt has not taken Dexamethasone dose yet.  Instructed Revonda Standard to have pt start taking steroids in am of  11/24/12 -  Due to late in the day today.  Reinforced with Revonda Standard that steroids need to be taken with food.  Revonda Standard voiced understanding.  Message to md. [name]'s  Phone   [phone].

## 2012-12-06 ENCOUNTER — Encounter: Payer: Self-pay | Admitting: Oncology

## 2012-12-06 NOTE — Progress Notes (Signed)
Received letter from Patient [employer].  Pt has been approved for Pomalyst.  Reimbursement is available from 11/12/12 to 11/11/13 or when benefit cap has been met.  The amount of the grant is $10,000.00.  I will give letter to Medical records to be scanned in pt's chart.

## 2012-12-17 ENCOUNTER — Other Ambulatory Visit: Payer: Self-pay | Admitting: *Deleted

## 2012-12-17 ENCOUNTER — Encounter: Payer: Self-pay | Admitting: Oncology

## 2012-12-17 MED ORDER — POMALIDOMIDE 2 MG PO CAPS: 2.0000 mg | ORAL_CAPSULE | Freq: Every day | ORAL | Status: DC

## 2012-12-17 NOTE — Telephone Encounter (Signed)
Spoke with daughter Revonda Standard for her to get intouch with [facility] for shipment of Pomalyst. Hopefully patient will get to start Cycle 2 for Monday 12/20/12.

## 2012-12-17 NOTE — Progress Notes (Signed)
[facility] called requesting a pomalyst prescription with [employer] to be faxed to [phone].

## 2012-12-21 ENCOUNTER — Other Ambulatory Visit: Payer: Self-pay | Admitting: *Deleted

## 2012-12-21 ENCOUNTER — Telehealth: Payer: Self-pay | Admitting: *Deleted

## 2012-12-21 NOTE — Telephone Encounter (Signed)
[employer] called.  Victoria [name] has signed for her mother for the pomalyst.  Does she have POA?     No POA listed in our records.  So [employer] will fax the form back to [place].   When she comes on 12/24/12 at 3pm for lab.  Please have pt. Cross out [employer], put her name and the date and then fax back to [employer].

## 2012-12-21 NOTE — Telephone Encounter (Signed)
THIS REFILL REQUEST FOR POMALYST WAS GIVEN TO DR.GRANFORTUNA'S NURSE, JAN MYERS,RN.

## 2012-12-22 ENCOUNTER — Other Ambulatory Visit: Payer: Medicare Other

## 2012-12-24 ENCOUNTER — Telehealth: Payer: Self-pay | Admitting: *Deleted

## 2012-12-24 ENCOUNTER — Other Ambulatory Visit (HOSPITAL_BASED_OUTPATIENT_CLINIC_OR_DEPARTMENT_OTHER): Payer: Medicare Other | Admitting: Lab

## 2012-12-24 DIAGNOSIS — C9000 Multiple myeloma not having achieved remission: Secondary | ICD-10-CM

## 2012-12-24 LAB — COMPREHENSIVE METABOLIC PANEL (CC13)
ALT: 16 U/L (ref 0–55)
AST: 15 U/L (ref 5–34)
Albumin: 3.3 g/dL — ABNORMAL LOW (ref 3.5–5.0)
Alkaline Phosphatase: 84 U/L (ref 40–150)
BUN: 14.8 mg/dL (ref 7.0–26.0)
Calcium: 9.7 mg/dL (ref 8.4–10.4)
Chloride: 107 mEq/L (ref 98–107)
Potassium: 3.5 mEq/L (ref 3.5–5.1)
Sodium: 145 mEq/L (ref 136–145)
Total Protein: 7.6 g/dL (ref 6.4–8.3)

## 2012-12-24 LAB — CBC WITH DIFFERENTIAL/PLATELET
BASO%: 1.3 % (ref 0.0–2.0)
Basophils Absolute: 0.1 10*3/uL (ref 0.0–0.1)
EOS%: 1.5 % (ref 0.0–7.0)
HCT: 32.9 % — ABNORMAL LOW (ref 34.8–46.6)
HGB: 10.5 g/dL — ABNORMAL LOW (ref 11.6–15.9)
LYMPH%: 17.1 % (ref 14.0–49.7)
MCH: 30.3 pg (ref 25.1–34.0)
MCHC: 32 g/dL (ref 31.5–36.0)
MCV: 94.6 fL (ref 79.5–101.0)
MONO%: 7.8 % (ref 0.0–14.0)
NEUT%: 72.3 % (ref 38.4–76.8)
lymph#: 0.8 10*3/uL — ABNORMAL LOW (ref 0.9–3.3)

## 2012-12-24 NOTE — Telephone Encounter (Signed)
Checked with pt's daughter, Revonda Standard to see if she has POA & she states "sorta but not legal POA"  [employer] papers signed by pt & refaxed to [employer] & our consent was also signed today.

## 2012-12-28 ENCOUNTER — Telehealth: Payer: Self-pay | Admitting: *Deleted

## 2012-12-28 LAB — IMMUNOFIXATION ELECTROPHORESIS
IgA: 282 mg/dL (ref 69–380)
IgM, Serum: 23 mg/dL — ABNORMAL LOW (ref 52–322)

## 2012-12-28 LAB — KAPPA/LAMBDA LIGHT CHAINS: Kappa:Lambda Ratio: 0.13 — ABNORMAL LOW (ref 0.26–1.65)

## 2012-12-28 NOTE — Telephone Encounter (Signed)
Received vm that [facility] that they have tried several times to reach pt to set up delivery & have not gotten a response & asked if there is any other phone #'s.  Returned call @ 4pm & gave pt's daughter's cell #

## 2012-12-31 ENCOUNTER — Ambulatory Visit (HOSPITAL_BASED_OUTPATIENT_CLINIC_OR_DEPARTMENT_OTHER): Payer: Medicare Other | Admitting: Oncology

## 2012-12-31 ENCOUNTER — Telehealth: Payer: Self-pay | Admitting: Oncology

## 2012-12-31 ENCOUNTER — Ambulatory Visit: Payer: Medicare Other | Admitting: Internal Medicine

## 2012-12-31 VITALS — BP 157/77 | HR 90 | Temp 98.5°F | Resp 18 | Ht 62.0 in | Wt 108.2 lb

## 2012-12-31 DIAGNOSIS — I10 Essential (primary) hypertension: Secondary | ICD-10-CM

## 2012-12-31 DIAGNOSIS — C9000 Multiple myeloma not having achieved remission: Secondary | ICD-10-CM

## 2012-12-31 NOTE — Patient Instructions (Signed)
We will increase Pomalidomide to 3 mg starting with the next prescription Start 1 baby aspirin 81 mg daily Lab every 2 weeks starting 4/18

## 2012-12-31 NOTE — Telephone Encounter (Signed)
Called pt and left message regarding lab and ML visit on June 2014, advised pt to get  appt calendar appt

## 2013-01-02 NOTE — Progress Notes (Signed)
Hematology and Oncology Follow Up Visit  Victoria [name] [id number] May 04, 1932 79 y.o. 01/02/2013 12:35 PM   Principle Diagnosis: Encounter Diagnosis  Name Primary?  . MULTIPLE  MYELOMA Yes     Interim History:   Followup visit for this 79 year old recently started back on treatment for her indolent, biclonal, IgG and IgA multiple myeloma initially diagnosed in 2008. Please see my recent 11/01/2012 progress note for full details. She had a long treatment free interval and was off all treatment since October 2009. She recently had a progressive rise in serum free lambda light chains and total IgG immunoglobulin with a stable IgA immunoglobulin. No significant new lesions on bone survey. She has small lucent lesions in the skull and questionable small new lesion in the right scapula which is asymptomatic. I elected to resume therapy with a new third generation IMID - pomalidomide at a dose of 2 mg on Mondays on 7 days rest to assess tolerance. She has tolerated the drug well. No constipation. No rash. No excessive sedation or other CNS side effects. She has had no interim medical problems or infection since visit here last month.  Medications: reviewed  Allergies:  Allergies  Allergen Reactions  . Amiodarone     rash  . Amoxicillin Hives and Itching  . Aspirin   . Atenolol     Hair loss    Review of Systems: Constitutional:   No constitutional symptoms Respiratory: No cough or dyspnea Cardiovascular:  No chest pain or palpitations Gastrointestinal: No abdominal pain or change in bowel habit.  Genito-Urinary: No urinary tract symptoms Musculoskeletal: No muscle or bone pain Neurologic: No headache or change in vision Skin: No rash or ecchymosis Remaining ROS negative.  Physical Exam: Blood pressure 157/77, pulse 90, temperature 98.5 F (36.9 C), temperature source Oral, resp. rate 18, height 5\' 2"  (1.575 m), weight 108 lb 3.2 oz (49.079 kg). Wt Readings from Last 3 Encounters:   12/31/12 108 lb 3.2 oz (49.079 kg)  10/29/12 99 lb 3.2 oz (44.997 kg)  10/13/12 103 lb (46.72 kg)     General appearance: Thin, frail, African American woman HENNT: Partial alopecia which is chronic. Pharynx no erythema, exudate, or ulcer Lymph nodes: No adenopathy Breasts: Lungs: Clear to auscultation resonant to percussion Heart: Regular rhythm no murmur Abdomen: Soft, nontender, no mass, no organomegaly Extremities: Asymmetric edema right calf greater than left. Right calf firm but not tender  Vascular: No cyanosis Neurologic: Motor strength 5 over 5, reflexes 1+ symmetric at the biceps, absent symmetric at the knees Skin: No rash or ecchymosis  Lab Results: Lab Results  Component Value Date   WBC 4.5 12/24/2012   HGB 10.5* 12/24/2012   HCT 32.9* 12/24/2012   MCV 94.6 12/24/2012   PLT 280 12/24/2012     Chemistry      Component Value Date/Time   NA 145 12/24/2012 1504   NA 139 04/08/2012 1513   K 3.5 12/24/2012 1504   K 3.3* 04/08/2012 1513   CL 107 12/24/2012 1504   CL 99 04/08/2012 1513   CO2 29 12/24/2012 1504   CO2 30 04/08/2012 1513   BUN 14.8 12/24/2012 1504   BUN 18 04/08/2012 1513   CREATININE 0.8 12/24/2012 1504   CREATININE 0.75 04/08/2012 1513      Component Value Date/Time   CALCIUM 9.7 12/24/2012 1504   CALCIUM 10.5 04/08/2012 1513   ALKPHOS 84 12/24/2012 1504   ALKPHOS 62 04/08/2012 1513   AST 15 12/24/2012 1504   AST  17 04/08/2012 1513   ALT 16 12/24/2012 1504   ALT 14 04/08/2012 1513   BILITOT 1.10 12/24/2012 1504   BILITOT 1.0 04/08/2012 1513    Lambda free light chain: 13.9 mg percent on March 28 down from 76.8 on January 24   Impression and Plan:  #1. Biclonal  IgA and IgG lambda myeloma She showing a rapid response to newly initiated treatment with low-dose pomalidomide. She is tolerating the drug well. I'm going to increase to 3 mg 3 weeks on one week rest period I advised her to start on one baby aspirin daily for thrombo prophylaxis.  #2. History of  perforated diverticulum requiring emergency surgery May 2012. Temporary colostomy now reversed. No new abdominal symptoms at this time.  #3. Essential hypertension  #4. History of psychotic episode in the past.   CC:.    Levert Feinstein, MD 4/6/201412:35 PM

## 2013-01-11 ENCOUNTER — Telehealth: Payer: Self-pay | Admitting: Dietician

## 2013-01-13 ENCOUNTER — Telehealth: Payer: Self-pay | Admitting: Oncology

## 2013-01-13 NOTE — Telephone Encounter (Signed)
Received call from [phone] (pt dtr) and message was left on vm to cx 4/18 lb appt. Returned call and lmonvm asking dtr to call us back to r/s appt. Message to desk nurse.

## 2013-01-14 ENCOUNTER — Other Ambulatory Visit: Payer: Medicare Other | Admitting: Lab

## 2013-01-18 ENCOUNTER — Other Ambulatory Visit: Payer: Self-pay | Admitting: *Deleted

## 2013-01-18 MED ORDER — POMALIDOMIDE 3 MG PO CAPS: 3.0000 mg | ORAL_CAPSULE | Freq: Every day | ORAL | Status: DC

## 2013-01-27 ENCOUNTER — Telehealth: Payer: Self-pay | Admitting: *Deleted

## 2013-01-27 NOTE — Telephone Encounter (Signed)
Received call from daughter, Revonda Standard stating it took 3 wks  For pt to get her pomalyst & has only taken new script 3 days & pt is scheduled for lab tomorrow.  Revonda Standard thinks it is too soon to draw lab since pt just got script.  Called [facility] & was told that they had made several attemps to reach pt/daughter to arrange delivery but no one calls them back.  The daughter states that she calls them but is placed on hold for 20" & then she gets tired of holding & hangs up.  Verified phone #'s & fax #'s for communication both of pt's daughter & this office.  Their fax # is [phone].  Will discuss with Dr. Cyndie Chime to see when pt needs to come back for labs.

## 2013-01-28 ENCOUNTER — Telehealth: Payer: Self-pay | Admitting: Oncology

## 2013-01-28 NOTE — Telephone Encounter (Signed)
lmonvm re next appt for 5/16. vm for dtr is full. Schedule mailed.

## 2013-02-01 ENCOUNTER — Telehealth: Payer: Self-pay | Admitting: *Deleted

## 2013-02-01 NOTE — Telephone Encounter (Signed)
2 attempts made 01/28/13 & today to reach daughter to discuss lab appts. Dr. Cyndie Chime had suggested to just keep appts made & skip 5/2.  Original order was for labs q 2 wks.  She will come 02/11/13 for lab & then we will schedule for q 2 wks.  Message left for Rose/Scheduler to this effect.

## 2013-02-03 ENCOUNTER — Other Ambulatory Visit: Payer: Self-pay | Admitting: *Deleted

## 2013-02-04 ENCOUNTER — Ambulatory Visit: Payer: Self-pay | Admitting: Internal Medicine

## 2013-02-09 ENCOUNTER — Telehealth: Payer: Self-pay | Admitting: Oncology

## 2013-02-09 NOTE — Telephone Encounter (Signed)
Victoria [name], for pt's dtr Victoria [name] re new time for 5/16 lb appt @ 3pm. [name] stating pt could not come in for AM appts and that appts needed to be after 2:30pm. Victoria [name] asked to get new schedule when pt comes in 5/16.

## 2013-02-10 ENCOUNTER — Other Ambulatory Visit: Payer: Self-pay | Admitting: *Deleted

## 2013-02-10 MED ORDER — POMALIDOMIDE 3 MG PO CAPS: 3.0000 mg | ORAL_CAPSULE | Freq: Every day | ORAL | Status: DC

## 2013-02-10 NOTE — Telephone Encounter (Signed)
Called and informed Victoria [name] that Rx has been faxed to [facility]

## 2013-02-11 ENCOUNTER — Other Ambulatory Visit (HOSPITAL_BASED_OUTPATIENT_CLINIC_OR_DEPARTMENT_OTHER): Payer: Medicare Other | Admitting: Lab

## 2013-02-11 ENCOUNTER — Other Ambulatory Visit: Payer: Medicare Other | Admitting: Lab

## 2013-02-11 DIAGNOSIS — C9000 Multiple myeloma not having achieved remission: Secondary | ICD-10-CM

## 2013-02-11 LAB — COMPREHENSIVE METABOLIC PANEL (CC13)
ALT: 13 U/L (ref 0–55)
CO2: 28 mEq/L (ref 22–29)
Calcium: 9.6 mg/dL (ref 8.4–10.4)
Chloride: 103 mEq/L (ref 98–107)
Glucose: 134 mg/dl — ABNORMAL HIGH (ref 70–99)
Sodium: 143 mEq/L (ref 136–145)
Total Protein: 8.1 g/dL (ref 6.4–8.3)

## 2013-02-11 LAB — CBC WITH DIFFERENTIAL/PLATELET
BASO%: 0.4 % (ref 0.0–2.0)
Basophils Absolute: 0 10*3/uL (ref 0.0–0.1)
EOS%: 0.6 % (ref 0.0–7.0)
HGB: 11.2 g/dL — ABNORMAL LOW (ref 11.6–15.9)
MCH: 30.2 pg (ref 25.1–34.0)
MCHC: 32.9 g/dL (ref 31.5–36.0)
MCV: 91.7 fL (ref 79.5–101.0)
MONO%: 3.1 % (ref 0.0–14.0)
RDW: 15.2 % — ABNORMAL HIGH (ref 11.2–14.5)
lymph#: 0.4 10*3/uL — ABNORMAL LOW (ref 0.9–3.3)

## 2013-02-14 LAB — IGG, IGA, IGM: IgM, Serum: 24 mg/dL — ABNORMAL LOW (ref 52–322)

## 2013-02-14 LAB — KAPPA/LAMBDA LIGHT CHAINS: Lambda Free Lght Chn: 16 mg/dL — ABNORMAL HIGH (ref 0.57–2.63)

## 2013-02-15 ENCOUNTER — Telehealth: Payer: Self-pay | Admitting: *Deleted

## 2013-02-15 NOTE — Telephone Encounter (Signed)
Pt & daughter in office last fri & req to be seen to discuss protocol to follow but they left before RN could get to lobby.  Message left late in the day fri to call us back on Monday.  Tried to reach daughter again today & left message to call back.

## 2013-02-25 ENCOUNTER — Other Ambulatory Visit (HOSPITAL_BASED_OUTPATIENT_CLINIC_OR_DEPARTMENT_OTHER): Payer: Medicare Other | Admitting: Lab

## 2013-02-25 ENCOUNTER — Other Ambulatory Visit: Payer: Medicare Other | Admitting: Lab

## 2013-02-25 DIAGNOSIS — C9000 Multiple myeloma not having achieved remission: Secondary | ICD-10-CM

## 2013-02-25 LAB — COMPREHENSIVE METABOLIC PANEL (CC13)
ALT: 10 U/L (ref 0–55)
Albumin: 3.6 g/dL (ref 3.5–5.0)
CO2: 29 mEq/L (ref 22–29)
Calcium: 9.9 mg/dL (ref 8.4–10.4)
Chloride: 104 mEq/L (ref 98–107)
Potassium: 3.6 mEq/L (ref 3.5–5.1)
Sodium: 141 mEq/L (ref 136–145)
Total Bilirubin: 1.01 mg/dL (ref 0.20–1.20)
Total Protein: 7.4 g/dL (ref 6.4–8.3)

## 2013-02-25 LAB — CBC WITH DIFFERENTIAL/PLATELET
BASO%: 0.4 % (ref 0.0–2.0)
Eosinophils Absolute: 0 10*3/uL (ref 0.0–0.5)
MCHC: 32.2 g/dL (ref 31.5–36.0)
MONO#: 0.5 10*3/uL (ref 0.1–0.9)
NEUT#: 2.6 10*3/uL (ref 1.5–6.5)
RBC: 3.81 10*6/uL (ref 3.70–5.45)
RDW: 16 % — ABNORMAL HIGH (ref 11.2–14.5)
WBC: 3.8 10*3/uL — ABNORMAL LOW (ref 3.9–10.3)
lymph#: 0.7 10*3/uL — ABNORMAL LOW (ref 0.9–3.3)

## 2013-03-01 ENCOUNTER — Telehealth: Payer: Self-pay | Admitting: *Deleted

## 2013-03-01 NOTE — Telephone Encounter (Addendum)
Received call from [facility]/Janice stating that they have tried 3 or more times to set up delivery for pomalyst & have been unable to reach pt/daughter.  Verified daughter, Victoria [name]'s ph # & infomed that I had also left message for her to return our call recently & have been unable to reach her.  Called pt's home # & reached pt & she states she will have daughter call us. Daughter returned call & left vm that she had been out of town & was just getting to her messages. Tried to reach daughter again today 03/02/13.  Left message to call back.

## 2013-03-02 ENCOUNTER — Telehealth: Payer: Self-pay | Admitting: *Deleted

## 2013-03-04 ENCOUNTER — Ambulatory Visit: Payer: Self-pay | Admitting: Internal Medicine

## 2013-03-04 ENCOUNTER — Telehealth: Payer: Self-pay | Admitting: *Deleted

## 2013-03-04 DIAGNOSIS — C9000 Multiple myeloma not having achieved remission: Secondary | ICD-10-CM

## 2013-03-04 NOTE — Telephone Encounter (Signed)
Message copied by Sabino Snipes on Fri Mar 04, 2013  3:18 PM ------      Message from: Levert Feinstein      Created: Sun Feb 27, 2013 11:03 PM       Call pt daughter  Calcium running high  May just be lab variation  Need to repeat in 1 week. Add odrer for quantitative immunoglobulins with IFE if not done day she has lab on 5/30  Misty Stanley can help c lab orders ------

## 2013-03-04 NOTE — Telephone Encounter (Addendum)
Reached pt today & informed of rising calcium although in normal range & will repeat at next visit 03/11/13 per Lonna Cobb NP & add Qig with IFE per Dr Patsy Lager instructions. Daughter was unable to get pt here earlier.  Daughter reports pt is on her break with pomalyst & will call [facility] to set up shipment of next cycle.

## 2013-03-11 ENCOUNTER — Ambulatory Visit (HOSPITAL_BASED_OUTPATIENT_CLINIC_OR_DEPARTMENT_OTHER): Payer: Medicare Other | Admitting: Nurse Practitioner

## 2013-03-11 ENCOUNTER — Other Ambulatory Visit (HOSPITAL_BASED_OUTPATIENT_CLINIC_OR_DEPARTMENT_OTHER): Payer: Medicare Other | Admitting: Lab

## 2013-03-11 ENCOUNTER — Telehealth: Payer: Self-pay | Admitting: Oncology

## 2013-03-11 VITALS — BP 178/68 | HR 78 | Temp 97.9°F | Resp 18 | Ht 62.0 in | Wt 102.5 lb

## 2013-03-11 DIAGNOSIS — C9000 Multiple myeloma not having achieved remission: Secondary | ICD-10-CM

## 2013-03-11 LAB — COMPREHENSIVE METABOLIC PANEL (CC13)
BUN: 13.6 mg/dL (ref 7.0–26.0)
CO2: 29 mEq/L (ref 22–29)
Calcium: 9.8 mg/dL (ref 8.4–10.4)
Chloride: 103 mEq/L (ref 98–107)
Creatinine: 0.8 mg/dL (ref 0.6–1.1)
Glucose: 91 mg/dl (ref 70–99)
Total Bilirubin: 1.06 mg/dL (ref 0.20–1.20)

## 2013-03-11 LAB — CBC WITH DIFFERENTIAL/PLATELET
Basophils Absolute: 0 10*3/uL (ref 0.0–0.1)
HCT: 34.7 % — ABNORMAL LOW (ref 34.8–46.6)
HGB: 11.4 g/dL — ABNORMAL LOW (ref 11.6–15.9)
LYMPH%: 15.3 % (ref 14.0–49.7)
MONO#: 0.2 10*3/uL (ref 0.1–0.9)
NEUT%: 74.3 % (ref 38.4–76.8)
Platelets: 232 10*3/uL (ref 145–400)
WBC: 3.6 10*3/uL — ABNORMAL LOW (ref 3.9–10.3)
lymph#: 0.5 10*3/uL — ABNORMAL LOW (ref 0.9–3.3)

## 2013-03-11 MED ORDER — DEXAMETHASONE 4 MG PO TABS: ORAL_TABLET | ORAL | Status: DC

## 2013-03-11 NOTE — Progress Notes (Signed)
OFFICE PROGRESS NOTE  Interval history:  Victoria [name] is a 79 year old woman with biclonal IgG and IgA multiple myeloma. Initial diagnosis dates to 2008. There was a recent progressive rise in serum free lambda light chains and total IgG with stable IgA immunoglobulin. Treatment was initiated with Pomalidomide at an initial dose of 2 mg 21 days on with a 7 day break and Decadron 20 mg weekly. There has been significant improvement in the lambda free light chains. The Pomalidomide dose was escalated to 3 mg 3 weeks on followed by a one-week break following an office visit 12/31/2012. Lambda free light chain value returned at 16 on 02/11/2013 as compared to 13.9 on 12/24/2012 and 76 10/22/2012.  Victoria [name] reports that she feels well. No interim illnesses or infections. Her appetite is better. She denies pain. No nausea or vomiting. No mouth sores. No diarrhea. No numbness or tingling in her hands or feet. No excessive sedation. She has mild intermittent leg swelling. She denies calf pain. No shortness of breath or chest pain.   Objective: Blood pressure 178/68, pulse 78, temperature 97.9 F (36.6 C), resp. rate 18, height 5\' 2"  (1.575 m), weight 102 lb 8 oz (46.494 kg), SpO2 100.00%.  Oropharynx is without thrush or ulceration. No palpable cervical, supraclavicular or axillary lymph nodes. Lungs are clear. Regular cardiac rhythm. Abdomen is soft and nontender. No organomegaly. Trace lower leg/ankle edema bilaterally. Calves are nontender.  Lab Results: Lab Results  Component Value Date   WBC 3.6* 03/11/2013   HGB 11.4* 03/11/2013   HCT 34.7* 03/11/2013   MCV 91.6 03/11/2013   PLT 232 03/11/2013    Chemistry:    Chemistry      Component Value Date/Time   NA 143 03/11/2013 1503   NA 139 04/08/2012 1513   K 3.5 03/11/2013 1503   K 3.3* 04/08/2012 1513   CL 103 03/11/2013 1503   CL 99 04/08/2012 1513   CO2 29 03/11/2013 1503   CO2 30 04/08/2012 1513   BUN 13.6 03/11/2013 1503   BUN 18 04/08/2012 1513   CREATININE 0.8 03/11/2013 1503   CREATININE 0.75 04/08/2012 1513      Component Value Date/Time   CALCIUM 9.8 03/11/2013 1503   CALCIUM 10.5 04/08/2012 1513   ALKPHOS 65 03/11/2013 1503   ALKPHOS 62 04/08/2012 1513   AST 13 03/11/2013 1503   AST 17 04/08/2012 1513   ALT 12 03/11/2013 1503   ALT 14 04/08/2012 1513   BILITOT 1.06 03/11/2013 1503   BILITOT 1.0 04/08/2012 1513       Studies/Results: No results found.  Medications: I have reviewed the patient's current medications.  Assessment/Plan:  1. Biclonal IgA and IgG lambda multiple myeloma currently on treatment with Pomalidomide with the dose escalated to 3 mg 3 weeks on/1 week off following an office visit 12/31/2012. 2. History of perforated diverticulum require emergency surgery May 2012. Temporary colostomy, status post reversal. 3. Hypertension. 4. History of psychotic episode in the past.  Disposition-she appears to be tolerating the Pomalidomide well. Plan to continue at the current dose. We will followup on the serum light chain analysis from today. Plan to continue labs on a 2 week schedule. She will return for a followup visit in 8 weeks.    Lonna Cobb ANP/GNP-BC

## 2013-03-11 NOTE — Telephone Encounter (Signed)
Gave pt appt for labs and MD on August 2014

## 2013-03-15 LAB — IMMUNOFIXATION ELECTROPHORESIS: Total Protein, Serum Electrophoresis: 7.3 g/dL (ref 6.0–8.3)

## 2013-03-15 LAB — KAPPA/LAMBDA LIGHT CHAINS
Kappa free light chain: 3.41 mg/dL — ABNORMAL HIGH (ref 0.33–1.94)
Kappa:Lambda Ratio: 0.13 — ABNORMAL LOW (ref 0.26–1.65)
Lambda Free Lght Chn: 27.1 mg/dL — ABNORMAL HIGH (ref 0.57–2.63)

## 2013-03-18 ENCOUNTER — Other Ambulatory Visit: Payer: Medicare Other

## 2013-03-25 ENCOUNTER — Other Ambulatory Visit (HOSPITAL_BASED_OUTPATIENT_CLINIC_OR_DEPARTMENT_OTHER): Payer: Medicare Other | Admitting: Lab

## 2013-03-25 ENCOUNTER — Other Ambulatory Visit: Payer: Medicare Other

## 2013-03-25 DIAGNOSIS — C9000 Multiple myeloma not having achieved remission: Secondary | ICD-10-CM

## 2013-03-25 LAB — COMPREHENSIVE METABOLIC PANEL (CC13)
BUN: 15 mg/dL (ref 7.0–26.0)
CO2: 30 mEq/L — ABNORMAL HIGH (ref 22–29)
Calcium: 10.1 mg/dL (ref 8.4–10.4)
Chloride: 105 mEq/L (ref 98–109)
Creatinine: 0.8 mg/dL (ref 0.6–1.1)
Glucose: 106 mg/dl (ref 70–140)
Total Bilirubin: 1.13 mg/dL (ref 0.20–1.20)

## 2013-03-25 LAB — CBC WITH DIFFERENTIAL/PLATELET
BASO%: 0.6 % (ref 0.0–2.0)
Basophils Absolute: 0 10*3/uL (ref 0.0–0.1)
Eosinophils Absolute: 0.1 10*3/uL (ref 0.0–0.5)
HCT: 35.7 % (ref 34.8–46.6)
HGB: 11.2 g/dL — ABNORMAL LOW (ref 11.6–15.9)
LYMPH%: 22 % (ref 14.0–49.7)
MCHC: 31.4 g/dL — ABNORMAL LOW (ref 31.5–36.0)
MONO#: 0.5 10*3/uL (ref 0.1–0.9)
NEUT%: 57.3 % (ref 38.4–76.8)
Platelets: 204 10*3/uL (ref 145–400)
WBC: 3.2 10*3/uL — ABNORMAL LOW (ref 3.9–10.3)

## 2013-03-28 ENCOUNTER — Other Ambulatory Visit: Payer: Self-pay | Admitting: *Deleted

## 2013-03-28 DIAGNOSIS — C9000 Multiple myeloma not having achieved remission: Secondary | ICD-10-CM

## 2013-03-28 MED ORDER — POMALIDOMIDE 3 MG PO CAPS: 3.0000 mg | ORAL_CAPSULE | Freq: Every day | ORAL | Status: DC

## 2013-03-28 NOTE — Telephone Encounter (Signed)
THIS REFILL REQUEST FOR POMALYST WAS PLACED IN DR.GRANFORTUNA'S ACTIVE WORK BOX. 

## 2013-03-28 NOTE — Telephone Encounter (Signed)
Left message for daughter, Revonda Standard to call back regarding survey & reminders regarding pomalyst.  Script faxed to [employer].

## 2013-04-08 ENCOUNTER — Other Ambulatory Visit (HOSPITAL_BASED_OUTPATIENT_CLINIC_OR_DEPARTMENT_OTHER): Payer: Medicare Other | Admitting: Lab

## 2013-04-08 ENCOUNTER — Other Ambulatory Visit: Payer: Medicare Other

## 2013-04-08 ENCOUNTER — Ambulatory Visit: Payer: Self-pay | Admitting: Internal Medicine

## 2013-04-08 DIAGNOSIS — C9000 Multiple myeloma not having achieved remission: Secondary | ICD-10-CM

## 2013-04-08 LAB — CBC WITH DIFFERENTIAL/PLATELET
BASO%: 4.5 % — ABNORMAL HIGH (ref 0.0–2.0)
Eosinophils Absolute: 0 10*3/uL (ref 0.0–0.5)
HCT: 32.8 % — ABNORMAL LOW (ref 34.8–46.6)
LYMPH%: 24.3 % (ref 14.0–49.7)
MONO#: 0.5 10*3/uL (ref 0.1–0.9)
NEUT#: 1.3 10*3/uL — ABNORMAL LOW (ref 1.5–6.5)
NEUT%: 52.1 % (ref 38.4–76.8)
Platelets: 305 10*3/uL (ref 145–400)
WBC: 2.6 10*3/uL — ABNORMAL LOW (ref 3.9–10.3)
lymph#: 0.6 10*3/uL — ABNORMAL LOW (ref 0.9–3.3)

## 2013-04-08 LAB — COMPREHENSIVE METABOLIC PANEL (CC13)
ALT: 16 U/L (ref 0–55)
AST: 16 U/L (ref 5–34)
CO2: 27 mEq/L (ref 22–29)
Chloride: 106 mEq/L (ref 98–109)
Creatinine: 0.8 mg/dL (ref 0.6–1.1)
Sodium: 143 mEq/L (ref 136–145)
Total Bilirubin: 0.78 mg/dL (ref 0.20–1.20)
Total Protein: 7.4 g/dL (ref 6.4–8.3)

## 2013-04-11 LAB — KAPPA/LAMBDA LIGHT CHAINS
Kappa:Lambda Ratio: 0.15 — ABNORMAL LOW (ref 0.26–1.65)
Lambda Free Lght Chn: 14.5 mg/dL — ABNORMAL HIGH (ref 0.57–2.63)

## 2013-04-11 LAB — IGG: IgG (Immunoglobin G), Serum: 1090 mg/dL (ref 690–1700)

## 2013-04-21 ENCOUNTER — Telehealth: Payer: Self-pay | Admitting: Oncology

## 2013-04-21 NOTE — Telephone Encounter (Signed)
pt daughter called and cancelled appt,nurse notified

## 2013-04-22 ENCOUNTER — Other Ambulatory Visit: Payer: Medicare Other | Admitting: Lab

## 2013-04-26 ENCOUNTER — Encounter: Payer: Self-pay | Admitting: Oncology

## 2013-04-26 NOTE — Progress Notes (Signed)
Received 60 day letter from [employer] wanting to know if pt still needed assistance with Pomalyst.  Per nurse, pt is still taking this.  I called [employer] making them aware of this.  Rep informed me she will request a 30 day extension but a claim will need to be filed in order to keep account open.

## 2013-05-04 ENCOUNTER — Other Ambulatory Visit: Payer: Self-pay

## 2013-05-06 ENCOUNTER — Telehealth: Payer: Self-pay | Admitting: Internal Medicine

## 2013-05-06 ENCOUNTER — Ambulatory Visit (HOSPITAL_BASED_OUTPATIENT_CLINIC_OR_DEPARTMENT_OTHER): Payer: Medicare Other | Admitting: Oncology

## 2013-05-06 ENCOUNTER — Other Ambulatory Visit (HOSPITAL_BASED_OUTPATIENT_CLINIC_OR_DEPARTMENT_OTHER): Payer: Medicare Other | Admitting: Lab

## 2013-05-06 VITALS — BP 166/74 | HR 84 | Temp 98.1°F | Resp 17 | Ht 62.0 in | Wt 109.0 lb

## 2013-05-06 DIAGNOSIS — C9000 Multiple myeloma not having achieved remission: Secondary | ICD-10-CM

## 2013-05-06 LAB — COMPREHENSIVE METABOLIC PANEL (CC13)
BUN: 12.1 mg/dL (ref 7.0–26.0)
CO2: 29 mEq/L (ref 22–29)
Calcium: 9.7 mg/dL (ref 8.4–10.4)
Chloride: 105 mEq/L (ref 98–109)
Creatinine: 0.9 mg/dL (ref 0.6–1.1)
Glucose: 111 mg/dl (ref 70–140)

## 2013-05-06 LAB — CBC WITH DIFFERENTIAL/PLATELET
LYMPH%: 12.8 % — ABNORMAL LOW (ref 14.0–49.7)
MCHC: 32.6 g/dL (ref 31.5–36.0)
MCV: 94.8 fL (ref 79.5–101.0)
MONO%: 4.3 % (ref 0.0–14.0)
NEUT#: 3.1 10*3/uL (ref 1.5–6.5)
Platelets: 196 10*3/uL (ref 145–400)
RBC: 3.7 10*6/uL (ref 3.70–5.45)
RDW: 16.6 % — ABNORMAL HIGH (ref 11.2–14.5)
WBC: 3.8 10*3/uL — ABNORMAL LOW (ref 3.9–10.3)

## 2013-05-06 NOTE — Telephone Encounter (Signed)
Patient's daughter is requesting a refill of Norvasc to be sent to [facility] on [address] .

## 2013-05-06 NOTE — Progress Notes (Signed)
Hematology and Oncology Follow Up Visit  Victoria [name] [id number] December 16, 1931 79 y.o. 05/06/2013 6:25 PM   Principle Diagnosis: Encounter Diagnoses  Name Primary?  . MULTIPLE  MYELOMA Yes  . MULTIPLE  MYELOMA      Interim History:   Followup visit for this pleasant but frail 79 year old woman on active treatment for biclonal IgG and IgA lambda multiple myeloma initially diagnosed in October, 2008. Initial treatment with oral Alkeran, prednisone, and Revlimid. Revlimid stopped early on due to dysphoric reaction. She was treated for 6 months. She reached a stable plateau and had a long treatment free interval. Last cycle given in October 2009. Secondary to rising paraprotein levels, treatment was recently reinitiated with oral pomalidomide in February of this year. Initial dose 2 mg 21 days on 7 day rest. She tolerated this well and was escalated to 3 mg which is her current dose. There's been a slow but steady improvement in her paraprotein levels. Total IgG 2540 mg percent, IgA 304 on January 24. Most recent value on 04/08/2013 IgG 1090. Serum lambda free light chains down from 76.8 mg percent in January to 14.5 as of 04/08/2013. She reports no side effects from the drug. She denies any paresthesias. No skin rash. She's had no other interim medical problems.  Medications: reviewed  Allergies:  Allergies  Allergen Reactions  . Amiodarone     rash  . Amoxicillin Hives and Itching  . Aspirin   . Atenolol     Hair loss    Review of Systems: Constitutional:  Improving constitutional symptoms  Respiratory: No cough or dyspnea Cardiovascular:  No chest pain or palpitations Gastrointestinal: No abdominal pain or change in bowel habit  Genito-Urinary: No urinary tract symptoms Musculoskeletal: No muscle bone or joint pain Neurologic: No headache or change in vision Skin: No rash Remaining ROS negative.  Physical Exam: Blood pressure 166/74, pulse 84, temperature 98.1 F (36.7 C),  temperature source Oral, resp. rate 17, height 5\' 2"  (1.575 m), weight 109 lb (49.442 kg), SpO2 100.00%. Wt Readings from Last 3 Encounters:  05/06/13 109 lb (49.442 kg)  03/11/13 102 lb 8 oz (46.494 kg)  12/31/12 108 lb 3.2 oz (49.079 kg)     General appearance: Thin African American woman HENNT: Pharynx no erythema or exudate Lymph nodes:  No adenopathy Breasts: Lungs: Clear to auscultation resonant to percussion Heart: Regular rhythm no murmur Abdomen: Soft, nontender, no mass, no organomegaly Extremities: No edema, no calf tenderness Musculoskeletal: No joint deformities GU: Vascular: No carotid bruits, no cyanosis Neurologic: Motor strength 5 over 5, reflexes absent symmetric, mild to moderate decrease in vibration sensation over the fingertips Skin: No rash or ecchymosis  Lab Results: Lab Results  Component Value Date   WBC 3.8* 05/06/2013   HGB 11.4* 05/06/2013   HCT 35.0 05/06/2013   MCV 94.8 05/06/2013   PLT 196 05/06/2013     Chemistry      Component Value Date/Time   NA 144 05/06/2013 1433   NA 139 04/08/2012 1513   K 3.6 05/06/2013 1433   K 3.3* 04/08/2012 1513   CL 103 03/11/2013 1503   CL 99 04/08/2012 1513   CO2 29 05/06/2013 1433   CO2 30 04/08/2012 1513   BUN 12.1 05/06/2013 1433   BUN 18 04/08/2012 1513   CREATININE 0.9 05/06/2013 1433   CREATININE 0.75 04/08/2012 1513      Component Value Date/Time   CALCIUM 9.7 05/06/2013 1433   CALCIUM 10.5 04/08/2012 1513   ALKPHOS  59 05/06/2013 1433   ALKPHOS 62 04/08/2012 1513   AST 15 05/06/2013 1433   AST 17 04/08/2012 1513   ALT 16 05/06/2013 1433   ALT 14 04/08/2012 1513   BILITOT 1.08 05/06/2013 1433   BILITOT 1.0 04/08/2012 1513    .  Impression: #1. Biclonal IgG and IgA multiple myeloma She is responding to pomalidomide 3 mg daily 21 days on 7 days rest with dexamethasone 20 mg weekly. Plan: Continue the same Daughter accompanies her on all visits and we reviewed lab work together.   #2. History of perforated diverticulum  requiring emergency surgery May 2012. Temporary colostomy now reversed. No new abdominal symptoms at this time.   #3. Essential hypertension   #4. History of psychotic episode in the past.    CC:. Dr. Elige Ko, MD 8/8/20146:25 PM

## 2013-05-09 ENCOUNTER — Other Ambulatory Visit: Payer: Self-pay | Admitting: *Deleted

## 2013-05-09 ENCOUNTER — Telehealth: Payer: Self-pay | Admitting: Oncology

## 2013-05-09 DIAGNOSIS — C9000 Multiple myeloma not having achieved remission: Secondary | ICD-10-CM

## 2013-05-09 LAB — KAPPA/LAMBDA LIGHT CHAINS
Kappa free light chain: 1.81 mg/dL (ref 0.33–1.94)
Kappa:Lambda Ratio: 0.07 — ABNORMAL LOW (ref 0.26–1.65)

## 2013-05-09 LAB — IGG, IGA, IGM: IgG (Immunoglobin G), Serum: 1360 mg/dL (ref 690–1700)

## 2013-05-09 MED ORDER — POMALIDOMIDE 3 MG PO CAPS: 3.0000 mg | ORAL_CAPSULE | Freq: Every day | ORAL | Status: DC

## 2013-05-09 MED ORDER — AMLODIPINE BESYLATE 5 MG PO TABS: 5.0000 mg | ORAL_TABLET | Freq: Every day | ORAL | Status: DC

## 2013-05-09 NOTE — Telephone Encounter (Signed)
s.w. pt daughter and advised on all appts...ok...she requested i mail appt.Marland KitchenMarland KitchenDone

## 2013-05-09 NOTE — Telephone Encounter (Signed)
Done

## 2013-05-19 ENCOUNTER — Telehealth: Payer: Self-pay | Admitting: *Deleted

## 2013-05-19 NOTE — Telephone Encounter (Signed)
Received a call from Keigan Tafoya/[facility].  They have tried unsuccessfully to contact patient re: delivery of pomalyst.   This will expire on 9/10.  Called patient.  No answer and not able to leave message @ [phone].  Called [phone] NA but left message for patient to call us.  Also called dtr and left message at her number to please call us ASAP.

## 2013-05-20 ENCOUNTER — Other Ambulatory Visit: Payer: Self-pay | Admitting: Internal Medicine

## 2013-05-20 NOTE — Telephone Encounter (Signed)
Ok to refill? Last OV 10.4.13 Last filled 2.18.14

## 2013-06-10 ENCOUNTER — Encounter: Payer: Self-pay | Admitting: Internal Medicine

## 2013-06-10 ENCOUNTER — Ambulatory Visit (INDEPENDENT_AMBULATORY_CARE_PROVIDER_SITE_OTHER): Payer: Medicare Other | Admitting: Internal Medicine

## 2013-06-10 ENCOUNTER — Telehealth: Payer: Self-pay | Admitting: *Deleted

## 2013-06-10 VITALS — BP 158/68 | HR 92 | Temp 98.4°F | Resp 12 | Wt 104.0 lb

## 2013-06-10 DIAGNOSIS — D638 Anemia in other chronic diseases classified elsewhere: Secondary | ICD-10-CM

## 2013-06-10 DIAGNOSIS — C9000 Multiple myeloma not having achieved remission: Secondary | ICD-10-CM

## 2013-06-10 DIAGNOSIS — F411 Generalized anxiety disorder: Secondary | ICD-10-CM

## 2013-06-10 DIAGNOSIS — R5381 Other malaise: Secondary | ICD-10-CM

## 2013-06-10 DIAGNOSIS — I10 Essential (primary) hypertension: Secondary | ICD-10-CM

## 2013-06-10 MED ORDER — AMLODIPINE BESYLATE 5 MG PO TABS: 5.0000 mg | ORAL_TABLET | Freq: Every day | ORAL | Status: DC

## 2013-06-10 MED ORDER — TRAMADOL HCL 50 MG PO TABS: ORAL_TABLET | ORAL | Status: DC

## 2013-06-10 MED ORDER — POMALIDOMIDE 3 MG PO CAPS: ORAL_CAPSULE | ORAL | Status: DC

## 2013-06-10 MED ORDER — LORAZEPAM 1 MG PO TABS: ORAL_TABLET | ORAL | Status: DC

## 2013-06-10 MED ORDER — HYDROCHLOROTHIAZIDE 25 MG PO TABS: 25.0000 mg | ORAL_TABLET | Freq: Every day | ORAL | Status: DC

## 2013-06-10 NOTE — Progress Notes (Signed)
Patient ID: Victoria [name], female   DOB: 04-27-1932, 79 y.o.   MRN: [id number]

## 2013-06-10 NOTE — Assessment & Plan Note (Signed)
Continue with current prescription therapy as reflected on the Med list.  

## 2013-06-10 NOTE — Assessment & Plan Note (Signed)
Labs

## 2013-06-10 NOTE — Assessment & Plan Note (Signed)
Chronic. 

## 2013-06-10 NOTE — Telephone Encounter (Signed)
Received call from Kelly[facility] stating that pomalyst script was sent out 05/06/13 & no one was available to sign for med & it got sent back & they were never able to reach pt or daughter & didn't receive call back & Berkley Harvey was expired.  Informed new script was sent today with new auth.  Tried to call pt's daughter, Revonda Standard.  Message left for her to call back Monday to discuss pomalyst.

## 2013-06-10 NOTE — Progress Notes (Signed)
Patient ID: Victoria [name], female   DOB: 1932-01-14, 79 y.o.   MRN: [id number] Patient ID: Victoria [name], female   DOB: 11/18/1931, 79 y.o.   MRN: [id number]  Subjective:    Patient ID: Victoria [name], female    DOB: 1931-11-28, 79 y.o.   MRN: [id number]  HPI The patient presents for a follow-up of  chronic hypertension, chronic dyslipidemia, post-op issues, controlled with medicines  Wt Readings from Last 3 Encounters:  06/10/13 104 lb (47.174 kg)  05/06/13 109 lb (49.442 kg)  03/11/13 102 lb 8 oz (46.494 kg)    BP Readings from Last 3 Encounters:  06/10/13 158/68  05/06/13 166/74  03/11/13 178/68      Review of Systems  Constitutional: Negative for chills, activity change, appetite change, fatigue and unexpected weight change.  HENT: Negative for congestion, mouth sores and sinus pressure.   Eyes: Negative for visual disturbance.  Respiratory: Negative for cough and chest tightness.   Gastrointestinal: Negative for nausea and abdominal pain.  Genitourinary: Negative for frequency, difficulty urinating and vaginal pain.  Musculoskeletal: Negative for back pain and gait problem.  Skin: Negative for pallor and rash.  Neurological: Negative for dizziness, tremors, weakness, numbness and headaches.  Psychiatric/Behavioral: Negative for confusion and sleep disturbance. The patient is nervous/anxious.        Objective:   Physical Exam  Constitutional: She appears well-developed. No distress.  thin  HENT:  Head: Normocephalic.  Right Ear: External ear normal.  Left Ear: External ear normal.  Nose: Nose normal.  Mouth/Throat: Oropharynx is clear and moist.  Eyes: Conjunctivae are normal. Pupils are equal, round, and reactive to light. Right eye exhibits no discharge. Left eye exhibits no discharge.  Neck: Normal range of motion. Neck supple. No JVD present. No tracheal deviation present. No thyromegaly present.  Cardiovascular: Normal rate, regular rhythm and normal heart  sounds.   Pulmonary/Chest: No stridor. No respiratory distress. She has no wheezes.  Abdominal: Soft. Bowel sounds are normal. She exhibits no distension and no mass. There is no tenderness. There is no rebound and no guarding.  Musculoskeletal: She exhibits no edema and no tenderness.  Lymphadenopathy:    She has no cervical adenopathy.  Neurological: She displays normal reflexes. No cranial nerve deficit. She exhibits normal muscle tone. Coordination normal.  Skin: No rash noted. No erythema.  Psychiatric: She has a normal mood and affect. Her behavior is normal. Judgment and thought content normal.   Lab Results  Component Value Date   WBC 3.8* 05/06/2013   HGB 11.4* 05/06/2013   HCT 35.0 05/06/2013   PLT 196 05/06/2013   GLUCOSE 111 05/06/2013   CHOL 158 03/09/2007   TRIG 37 03/09/2007   HDL 58.8 03/09/2007   LDLCALC 92 03/09/2007   ALT 16 05/06/2013   AST 15 05/06/2013   NA 144 05/06/2013   K 3.6 05/06/2013   CL 103 03/11/2013   CREATININE 0.9 05/06/2013   BUN 12.1 05/06/2013   CO2 29 05/06/2013   TSH 1.127 02/21/2011   INR 1.19 02/21/2011   HGBA1C 5.3 03/09/2007          Assessment & Plan:

## 2013-06-17 ENCOUNTER — Other Ambulatory Visit: Payer: Medicare Other | Admitting: Lab

## 2013-06-24 ENCOUNTER — Other Ambulatory Visit: Payer: Self-pay | Admitting: *Deleted

## 2013-06-24 DIAGNOSIS — C9000 Multiple myeloma not having achieved remission: Secondary | ICD-10-CM

## 2013-06-24 MED ORDER — POMALIDOMIDE 4 MG PO CAPS: 4.0000 mg | ORAL_CAPSULE | Freq: Every day | ORAL | Status: DC

## 2013-06-24 NOTE — Telephone Encounter (Signed)
Dtr. Jeanene Erb concerned that [facility] had increased the dose.  Let her know that Dr. Cyndie Chime did increase the dose to 4mg .  She is fine with this and [facility] will ship on  Tuesday 9/30.

## 2013-07-19 ENCOUNTER — Other Ambulatory Visit: Payer: Self-pay | Admitting: *Deleted

## 2013-07-19 DIAGNOSIS — C9000 Multiple myeloma not having achieved remission: Secondary | ICD-10-CM

## 2013-07-19 NOTE — Telephone Encounter (Signed)
THIS REFILL REQUEST FOR POMALYST WAS PLACED IN DR.GRANFORTUNA'S ACTIVE WORK BOX. 

## 2013-07-22 ENCOUNTER — Other Ambulatory Visit (HOSPITAL_BASED_OUTPATIENT_CLINIC_OR_DEPARTMENT_OTHER): Payer: Medicare Other | Admitting: Lab

## 2013-07-22 DIAGNOSIS — C9000 Multiple myeloma not having achieved remission: Secondary | ICD-10-CM

## 2013-07-22 LAB — CBC WITH DIFFERENTIAL/PLATELET
BASO%: 0.6 % (ref 0.0–2.0)
Basophils Absolute: 0 10*3/uL (ref 0.0–0.1)
EOS%: 0.5 % (ref 0.0–7.0)
HCT: 36.4 % (ref 34.8–46.6)
LYMPH%: 12.7 % — ABNORMAL LOW (ref 14.0–49.7)
MCH: 30.3 pg (ref 25.1–34.0)
MCHC: 31.6 g/dL (ref 31.5–36.0)
MCV: 95.9 fL (ref 79.5–101.0)
MONO%: 4.2 % (ref 0.0–14.0)
NEUT%: 82 % — ABNORMAL HIGH (ref 38.4–76.8)
Platelets: 194 10*3/uL (ref 145–400)
lymph#: 0.5 10*3/uL — ABNORMAL LOW (ref 0.9–3.3)

## 2013-07-22 LAB — COMPREHENSIVE METABOLIC PANEL (CC13)
ALT: 12 U/L (ref 0–55)
AST: 18 U/L (ref 5–34)
Anion Gap: 10 mEq/L (ref 3–11)
CO2: 27 mEq/L (ref 22–29)
Sodium: 141 mEq/L (ref 136–145)
Total Bilirubin: 1.23 mg/dL — ABNORMAL HIGH (ref 0.20–1.20)
Total Protein: 8 g/dL (ref 6.4–8.3)

## 2013-07-25 LAB — KAPPA/LAMBDA LIGHT CHAINS
Kappa:Lambda Ratio: 0.05 — ABNORMAL LOW (ref 0.26–1.65)
Lambda Free Lght Chn: 34.6 mg/dL — ABNORMAL HIGH (ref 0.57–2.63)

## 2013-07-25 LAB — IGG, IGA, IGM: IgM, Serum: 18 mg/dL — ABNORMAL LOW (ref 52–322)

## 2013-07-26 MED ORDER — POMALIDOMIDE 4 MG PO CAPS: 4.0000 mg | ORAL_CAPSULE | Freq: Every day | ORAL | Status: DC

## 2013-07-26 NOTE — Addendum Note (Signed)
Addended by: Arvilla Meres on: 07/26/2013 10:02 AM   Modules accepted: Orders

## 2013-07-29 ENCOUNTER — Ambulatory Visit (HOSPITAL_BASED_OUTPATIENT_CLINIC_OR_DEPARTMENT_OTHER): Payer: Medicare Other | Admitting: Oncology

## 2013-07-29 ENCOUNTER — Other Ambulatory Visit: Payer: Medicare Other | Admitting: Lab

## 2013-07-29 VITALS — BP 169/78 | HR 101 | Temp 97.3°F | Resp 17 | Ht 62.0 in | Wt 103.4 lb

## 2013-07-29 DIAGNOSIS — C9000 Multiple myeloma not having achieved remission: Secondary | ICD-10-CM

## 2013-07-29 DIAGNOSIS — I10 Essential (primary) hypertension: Secondary | ICD-10-CM

## 2013-07-29 MED ORDER — INFLUENZA VAC SPLIT QUAD 0.5 ML IM SUSP: 0.5000 mL | INTRAMUSCULAR | Status: DC

## 2013-07-30 NOTE — Progress Notes (Signed)
Hematology and Oncology Follow Up Visit  Victoria [name] [id number] 1932-07-14 79 y.o. 07/30/2013 7:19 PM   Principle Diagnosis: Encounter Diagnosis  Name Primary?  . MULTIPLE  MYELOMA Yes     Interim History:   Followup visit for this pleasant but frail 79 year old woman on active treatment for biclonal IgG and IgA lambda multiple myeloma initially diagnosed in October, 2008. Initial treatment with oral Alkeran, prednisone, and Revlimid. Revlimid stopped early on due to dysphoric reaction. She was treated for 6 months. She reached a stable plateau and had a long treatment free interval. Last cycle given in October 2009. Secondary to rising paraprotein levels, treatment was recently reinitiated with oral pomalidomide in February of this year. Initial dose 2 mg 21 days on 7 day rest. She tolerated this well and was escalated to 3 mg then subsequently to maximum target dose of 4 mg beginning in September.. There's been a slow but steady improvement in her paraprotein levels although most recent values done on October 24 show early trend for increase with rise in IgG from 1360 mg percent to 1450 and rise in lambda free light chains from 26.8 mg percent to 34.6. Hematologic profile remains stable with hemoglobin 11.5, white count 4000 with 82% neutrophils, and platelet count 194,000. She denies any bone pain. She had minimal bone pain at diagnosis with a asymptomatic lesion in her hip found incidentally when she fell down went to the emergency department for further evaluation. This lesion was biopsied and showed plasma cells.  She reports no major side effects from the drug. She denies any paresthesias. No skin rash.  She's had no other interim medical problems. She has chronic problems with her left eye which is flaring up at time of today's visit. She is being followed by an ophthalmologist.   Medications: reviewed  Allergies:  Allergies  Allergen Reactions  . Amiodarone     rash  .  Amoxicillin Hives and Itching  . Aspirin   . Atenolol     Hair loss    Review of Systems: Hematology: negative for swollen glands, easy bruising,  ENT ROS: negative for - oral lesions or sore throat Breast ROS:  Respiratory ROS: negative for - cough, pleuritic pain, shortness of breath or wheezing Cardiovascular ROS: negative for - chest pain, dyspnea on exertion, edema, irregular heartbeat, murmur, orthopnea, palpitations, paroxysmal nocturnal dyspnea or rapid heart rate Gastrointestinal ROS: negative for - abdominal pain, appetite loss, blood in stools, change in bowel habits, constipation, diarrhea, heartburn, hematemesis, melena, nausea/vomiting or swallowing difficulty/pain Genito-Urinary ROS: negative for - , dysuria, hematuria, incontinence,  or urinary frequency/urgency Musculoskeletal ROS: negative for - joint pain, joint stiffness, joint swelling, muscle pain, muscular weakness  Neurological ROS: negative for - behavioral changes, confusion, dizziness, gait disturbance, headaches, impaired coordination/balance, memory loss, numbness/tingling,  Dermatological ROS: negative for rash, ecchymosis Remaining ROS negative.  Physical Exam: Blood pressure 169/78, pulse 101, temperature 97.3 F (36.3 C), temperature source Oral, resp. rate 17, height 5\' 2"  (1.575 m), weight 103 lb 6.4 oz (46.902 kg), SpO2 97.00%. Wt Readings from Last 3 Encounters:  07/29/13 103 lb 6.4 oz (46.902 kg)  06/10/13 104 lb (47.174 kg)  05/06/13 109 lb (49.442 kg)     General appearance: Thin, Frail, African American woman HENNT: Pharynx no erythema, exudate, mass, or ulcer. No thyromegaly or thyroid nodules Lymph nodes: No cervical, supraclavicular, or axillary lymphadenopathy Breasts:  Lungs: Clear to auscultation, resonant to percussion throughout Heart: Regular rhythm, no murmur, no gallop, no  rub, no click, no edema Abdomen: Soft, nontender, normal bowel sounds, no mass, no organomegaly Extremities:  No edema, no calf tenderness Musculoskeletal: no joint deformities GU: Vascular: Carotid pulses 2+, no bruits,  Neurologic: Alert, oriented, PERRLA,  cranial nerves grossly normal, motor strength 5 over 5, reflexes 1+ symmetric, upper body coordination normal, gait normal, sensation intact to vibration over the fingertips. Skin: No rash or ecchymosis  Lab Results: CBC W/Diff    Component Value Date/Time   WBC 4.0 07/22/2013 1534   WBC 4.3 06/28/2011 0425   RBC 3.80 07/22/2013 1534   RBC 3.33* 06/28/2011 0425   HGB 11.5* 07/22/2013 1534   HGB 10.8* 06/30/2011 0350   HCT 36.4 07/22/2013 1534   HCT 31.2* 06/28/2011 0425   PLT 194 07/22/2013 1534   PLT 154 06/28/2011 0425   MCV 95.9 07/22/2013 1534   MCV 93.7 06/28/2011 0425   MCH 30.3 07/22/2013 1534   MCH 30.0 06/28/2011 0425   MCHC 31.6 07/22/2013 1534   MCHC 32.1 06/28/2011 0425   RDW 13.8 07/22/2013 1534   RDW 14.2 06/28/2011 0425   LYMPHSABS 0.5* 07/22/2013 1534   LYMPHSABS 0.3* 02/23/2011 0536   MONOABS 0.2 07/22/2013 1534   MONOABS 0.5 02/23/2011 0536   EOSABS 0.0 07/22/2013 1534   EOSABS 0.0 02/23/2011 0536   BASOSABS 0.0 07/22/2013 1534   BASOSABS 0.0 02/23/2011 0536     Chemistry      Component Value Date/Time   NA 141 07/22/2013 1533   NA 139 04/08/2012 1513   K 3.7 07/22/2013 1533   K 3.3* 04/08/2012 1513   CL 103 03/11/2013 1503   CL 99 04/08/2012 1513   CO2 27 07/22/2013 1533   CO2 30 04/08/2012 1513   BUN 12.6 07/22/2013 1533   BUN 18 04/08/2012 1513   CREATININE 0.8 07/22/2013 1533   CREATININE 0.75 04/08/2012 1513      Component Value Date/Time   CALCIUM 10.1 07/22/2013 1533   CALCIUM 10.5 04/08/2012 1513   ALKPHOS 56 07/22/2013 1533   ALKPHOS 62 04/08/2012 1513   AST 18 07/22/2013 1533   AST 17 04/08/2012 1513   ALT 12 07/22/2013 1533   ALT 14 04/08/2012 1513   BILITOT 1.23* 07/22/2013 1533   BILITOT 1.0 04/08/2012 1513       Radiological Studies: No results found.  Impression:  #1. Biclonal IgG and IgA  multiple myeloma  She is responding to pomalidomide now at maximum target dose of 4 mg daily 21 days on 7 days rest with dexamethasone 20 mg weekly. Dose was just increased for her cycle beginning in September. Minimal rise in her paraprotein levels in October as outlined above compared with previous but I would like to keep her on the pomalidomide at present. She is due for a skeletal bone survey and we will do this when she comes back for her next lab evaluation. Daughter accompanies her on all visits and we reviewed lab work together.  #2. History of perforated diverticulum requiring emergency surgery May 2012. Temporary colostomy now reversed. No new abdominal symptoms at this time.   #3. Essential hypertension   #4. History of psychotic episode in the past.  Miscellaneous: We offered her the flu vaccine today but it got late and she will get it through her primary care physician's office or a local pharmacy.    CC: Patient Care Team: Tresa Garter, MD as PCP - General Levert Feinstein, MD (Hematology and Oncology) Hart Carwin, MD as Consulting  Physician (Gastroenterology) Marykay Lex, MD as Consulting Physician (Cardiology)   Levert Feinstein, MD 11/1/20147:19 PM

## 2013-08-01 ENCOUNTER — Telehealth: Payer: Self-pay | Admitting: *Deleted

## 2013-08-01 ENCOUNTER — Telehealth: Payer: Self-pay | Admitting: Oncology

## 2013-08-01 NOTE — Telephone Encounter (Signed)
lvm for pt regarding to all appts...mailed pt avs and letter

## 2013-08-01 NOTE — Telephone Encounter (Signed)
Received message from [facility] stating that they have been unable to reach pt to schedule delivery of pomalyst & after 3 trys they will continue to try but are obligated to notify MD.  Informed that I would also try to reach daughter, Revonda Standard.  Message left on her vm to call us back.

## 2013-08-03 ENCOUNTER — Other Ambulatory Visit: Payer: Self-pay | Admitting: *Deleted

## 2013-08-03 DIAGNOSIS — C9000 Multiple myeloma not having achieved remission: Secondary | ICD-10-CM

## 2013-08-03 MED ORDER — POMALIDOMIDE 4 MG PO CAPS: 4.0000 mg | ORAL_CAPSULE | Freq: Every day | ORAL | Status: DC

## 2013-08-04 ENCOUNTER — Other Ambulatory Visit: Payer: Self-pay

## 2013-08-26 ENCOUNTER — Ambulatory Visit (HOSPITAL_COMMUNITY): Payer: Medicare Other

## 2013-08-30 ENCOUNTER — Telehealth: Payer: Self-pay | Admitting: *Deleted

## 2013-08-30 NOTE — Telephone Encounter (Signed)
Received vm call from [place] with [facility] from 08/29/13 stating that pomalyst script has been discontinued since auth has expired & will need new script with new auth.  They can be reached at [phone] option 2. Message left on pt's daughter's vm to call us regarding her mother's meds.

## 2013-09-02 ENCOUNTER — Ambulatory Visit (HOSPITAL_COMMUNITY)
Admission: RE | Admit: 2013-09-02 | Discharge: 2013-09-02 | Disposition: A | Discharge status: Home / Self Care | Payer: Medicare Other | Source: Ambulatory Visit | Attending: Oncology | Admitting: Oncology

## 2013-09-02 ENCOUNTER — Telehealth: Payer: Self-pay | Admitting: Oncology

## 2013-09-02 ENCOUNTER — Other Ambulatory Visit: Payer: Medicare Other | Admitting: Lab

## 2013-09-02 DIAGNOSIS — M47817 Spondylosis without myelopathy or radiculopathy, lumbosacral region: Secondary | ICD-10-CM | POA: Insufficient documentation

## 2013-09-02 DIAGNOSIS — M899 Disorder of bone, unspecified: Secondary | ICD-10-CM | POA: Insufficient documentation

## 2013-09-02 DIAGNOSIS — M47814 Spondylosis without myelopathy or radiculopathy, thoracic region: Secondary | ICD-10-CM | POA: Insufficient documentation

## 2013-09-02 DIAGNOSIS — M47812 Spondylosis without myelopathy or radiculopathy, cervical region: Secondary | ICD-10-CM | POA: Insufficient documentation

## 2013-09-02 DIAGNOSIS — C9000 Multiple myeloma not having achieved remission: Secondary | ICD-10-CM

## 2013-09-02 DIAGNOSIS — M4120 Other idiopathic scoliosis, site unspecified: Secondary | ICD-10-CM | POA: Insufficient documentation

## 2013-09-02 NOTE — Telephone Encounter (Signed)
pts dgt came at 4:25 pm stating they were detained in Xray today and too late to have labs drawn so I rs 12/5 lab for 12/12 at 3pm at [facility] request shh

## 2013-09-09 ENCOUNTER — Other Ambulatory Visit (HOSPITAL_BASED_OUTPATIENT_CLINIC_OR_DEPARTMENT_OTHER): Payer: Medicare Other

## 2013-09-09 DIAGNOSIS — C9000 Multiple myeloma not having achieved remission: Secondary | ICD-10-CM

## 2013-09-09 LAB — CBC WITH DIFFERENTIAL/PLATELET
BASO%: 0.7 % (ref 0.0–2.0)
Basophils Absolute: 0 10*3/uL (ref 0.0–0.1)
MCH: 30.3 pg (ref 25.1–34.0)
MCHC: 30.8 g/dL — ABNORMAL LOW (ref 31.5–36.0)
MCV: 98.3 fL (ref 79.5–101.0)
MONO#: 0.2 10*3/uL (ref 0.1–0.9)
MONO%: 7.5 % (ref 0.0–14.0)
RBC: 3.86 10*6/uL (ref 3.70–5.45)
RDW: 14 % (ref 11.2–14.5)
WBC: 3.3 10*3/uL — ABNORMAL LOW (ref 3.9–10.3)
lymph#: 0.5 10*3/uL — ABNORMAL LOW (ref 0.9–3.3)

## 2013-09-09 LAB — COMPREHENSIVE METABOLIC PANEL (CC13)
ALT: 16 U/L (ref 0–55)
AST: 18 U/L (ref 5–34)
Alkaline Phosphatase: 52 U/L (ref 40–150)
BUN: 12.5 mg/dL (ref 7.0–26.0)
Calcium: 10.3 mg/dL (ref 8.4–10.4)
Creatinine: 0.9 mg/dL (ref 0.6–1.1)
Total Bilirubin: 1.52 mg/dL — ABNORMAL HIGH (ref 0.20–1.20)

## 2013-09-13 LAB — IMMUNOFIXATION ELECTROPHORESIS
IgA: 255 mg/dL (ref 69–380)
IgM, Serum: 20 mg/dL — ABNORMAL LOW (ref 52–322)
Total Protein, Serum Electrophoresis: 7.8 g/dL (ref 6.0–8.3)

## 2013-09-13 LAB — KAPPA/LAMBDA LIGHT CHAINS: Kappa:Lambda Ratio: 0.05 — ABNORMAL LOW (ref 0.26–1.65)

## 2013-09-30 ENCOUNTER — Ambulatory Visit (HOSPITAL_BASED_OUTPATIENT_CLINIC_OR_DEPARTMENT_OTHER): Payer: Medicare Other | Admitting: Oncology

## 2013-09-30 ENCOUNTER — Other Ambulatory Visit (HOSPITAL_BASED_OUTPATIENT_CLINIC_OR_DEPARTMENT_OTHER): Payer: Medicare Other

## 2013-09-30 VITALS — BP 136/67 | HR 99 | Temp 98.0°F | Resp 17 | Ht 62.0 in | Wt 107.7 lb

## 2013-09-30 DIAGNOSIS — I10 Essential (primary) hypertension: Secondary | ICD-10-CM

## 2013-09-30 DIAGNOSIS — C9000 Multiple myeloma not having achieved remission: Secondary | ICD-10-CM

## 2013-09-30 LAB — CBC WITH DIFFERENTIAL/PLATELET
BASO%: 0.2 % (ref 0.0–2.0)
Basophils Absolute: 0 10*3/uL (ref 0.0–0.1)
EOS%: 0.1 % (ref 0.0–7.0)
Eosinophils Absolute: 0 10*3/uL (ref 0.0–0.5)
HCT: 38 % (ref 34.8–46.6)
HGB: 12.4 g/dL (ref 11.6–15.9)
LYMPH%: 3.4 % — AB (ref 14.0–49.7)
MCH: 31.9 pg (ref 25.1–34.0)
MCHC: 32.8 g/dL (ref 31.5–36.0)
MCV: 97.4 fL (ref 79.5–101.0)
MONO#: 0.1 10*3/uL (ref 0.1–0.9)
MONO%: 1.1 % (ref 0.0–14.0)
NEUT#: 6.7 10*3/uL — ABNORMAL HIGH (ref 1.5–6.5)
NEUT%: 95.2 % — ABNORMAL HIGH (ref 38.4–76.8)
PLATELETS: 214 10*3/uL (ref 145–400)
RBC: 3.9 10*6/uL (ref 3.70–5.45)
RDW: 13.5 % (ref 11.2–14.5)
WBC: 7 10*3/uL (ref 3.9–10.3)
lymph#: 0.2 10*3/uL — ABNORMAL LOW (ref 0.9–3.3)

## 2013-09-30 LAB — COMPREHENSIVE METABOLIC PANEL (CC13)
ALT: 13 U/L (ref 0–55)
ANION GAP: 14 meq/L — AB (ref 3–11)
AST: 16 U/L (ref 5–34)
Albumin: 4 g/dL (ref 3.5–5.0)
Alkaline Phosphatase: 62 U/L (ref 40–150)
BUN: 12.7 mg/dL (ref 7.0–26.0)
CALCIUM: 10.1 mg/dL (ref 8.4–10.4)
CHLORIDE: 106 meq/L (ref 98–109)
CO2: 24 meq/L (ref 22–29)
Creatinine: 0.9 mg/dL (ref 0.6–1.1)
Glucose: 108 mg/dl (ref 70–140)
Potassium: 3.7 mEq/L (ref 3.5–5.1)
SODIUM: 144 meq/L (ref 136–145)
TOTAL PROTEIN: 8.7 g/dL — AB (ref 6.4–8.3)
Total Bilirubin: 1.29 mg/dL — ABNORMAL HIGH (ref 0.20–1.20)

## 2013-09-30 NOTE — Progress Notes (Signed)
Hematology and Oncology Follow Up Visit  Victoria [name] [id number] Jul 04, 1932 80 y.o. 09/30/2013 5:12 PM   Principle Diagnosis: Encounter Diagnosis  Name Primary?  . MULTIPLE  MYELOMA Yes     Interim History:   Followup visit for this pleasant but frail 80 year old woman on active treatment for biclonal IgG and IgA lambda multiple myeloma initially diagnosed in October, 2008. Initial treatment with oral Alkeran, prednisone, and Revlimid. Revlimid stopped early on due to dysphoric reaction. She was treated for 6 months. She reached a stable plateau and had a long treatment free interval. Last cycle given in October 2009. Secondary to rising paraprotein levels, treatment was  reinitiated with oral pomalidomide in February 2014. Initial dose 2 mg 21 days on 7 day rest. She tolerated this well and was escalated to 3 mg which is her current dose. There was   a slow but steady improvement in her paraprotein levels until recently. Trend over the last 3 months it is for slow but progressive rise in total IgG and lambda free light chains. Nadir IgG 1090 mg percent on 04/08/2013. Current value 1620 mg percent on 09/09/2013. Peak lambda free light chain 76.8 mg percent 10/22/2012 fell to a nadir of 14.5 by July 11 and now current value of 42.2. CBC has remained stable over this interval and hemoglobin today is 12.4 with white count 7000 and platelet count 214,000. I repeated a skeletal bone survey on December 5. There may be a few more lesions in her calvarium which are asymptomatic. Vague lucencies left proximal humerus not clearly seen on prior study. Also asymptomatic. She's had no interim medical problems. No infections.   Medications: reviewed  Allergies:  Allergies  Allergen Reactions  . Amiodarone     rash  . Amoxicillin Hives and Itching  . Aspirin   . Atenolol     Hair loss    Review of Systems: Hematology: No bleeding or bruising ENT ROS: No sore throat Breast ROS:  Respiratory ROS:  No cough or dyspnea Cardiovascular ROS:  No chest pain or palpitations Gastrointestinal ROS: No abdominal pain or change in bowel habit Genito-Urinary ROS: No urinary tract symptoms Musculoskeletal ROS: No bone pain Neurological ROS: No headache or change in vision Dermatological ROS: No rash Remaining ROS negative.  Physical Exam: Blood pressure 136/67, pulse 99, temperature 98 F (36.7 C), temperature source Oral, resp. rate 17, height $RemoveBe'5\' 2"'CeWrZhixR$  (1.575 m), weight 107 lb 11.2 oz (48.852 kg), SpO2 97.00%. Wt Readings from Last 3 Encounters:  09/30/13 107 lb 11.2 oz (48.852 kg)  07/29/13 103 lb 6.4 oz (46.902 kg)  06/10/13 104 lb (47.174 kg)     General appearance: Frail, petite, African American woman HENNT: Pharynx no erythema, exudate, mass, or ulcer. No thyromegaly or thyroid nodules Lymph nodes: No cervical, supraclavicular, or axillary lymphadenopathy Breasts:  Lungs: Clear to auscultation, resonant to percussion throughout except decreased breath sounds and poor air movement right base Heart: Regular rhythm, no murmur, no gallop, no rub, no click, no edema Abdomen: Soft, nontender, normal bowel sounds, no mass, no organomegaly Extremities: No edema, no calf tenderness Musculoskeletal: no joint deformities GU:  Vascular: Carotid pulses 2+, no bruits,  Neurologic: Alert, oriented, PERRLA, , cranial nerves grossly normal, motor strength 5 over 5, reflexes 1+ symmetric, upper body coordination normal, gait normal, Skin: No rash or ecchymosis  Lab Results: CBC W/Diff    Component Value Date/Time   WBC 7.0 09/30/2013 1607   WBC 4.3 06/28/2011 0425   RBC 3.90  09/30/2013 1607   RBC 3.33* 06/28/2011 0425   HGB 12.4 09/30/2013 1607   HGB 10.8* 06/30/2011 0350   HCT 38.0 09/30/2013 1607   HCT 31.2* 06/28/2011 0425   PLT 214 09/30/2013 1607   PLT 154 06/28/2011 0425   MCV 97.4 09/30/2013 1607   MCV 93.7 06/28/2011 0425   MCH 31.9 09/30/2013 1607   MCH 30.0 06/28/2011 0425   MCHC 32.8 09/30/2013 1607    MCHC 32.1 06/28/2011 0425   RDW 13.5 09/30/2013 1607   RDW 14.2 06/28/2011 0425   LYMPHSABS 0.2* 09/30/2013 1607   LYMPHSABS 0.3* 02/23/2011 0536   MONOABS 0.1 09/30/2013 1607   MONOABS 0.5 02/23/2011 0536   EOSABS 0.0 09/30/2013 1607   EOSABS 0.0 02/23/2011 0536   BASOSABS 0.0 09/30/2013 1607   BASOSABS 0.0 02/23/2011 0536     Chemistry      Component Value Date/Time   NA 144 09/30/2013 1607   NA 139 04/08/2012 1513   K 3.7 09/30/2013 1607   K 3.3* 04/08/2012 1513   CL 103 03/11/2013 1503   CL 99 04/08/2012 1513   CO2 24 09/30/2013 1607   CO2 30 04/08/2012 1513   BUN 12.7 09/30/2013 1607   BUN 18 04/08/2012 1513   CREATININE 0.9 09/30/2013 1607   CREATININE 0.75 04/08/2012 1513      Component Value Date/Time   CALCIUM 10.1 09/30/2013 1607   CALCIUM 10.5 04/08/2012 1513   ALKPHOS 62 09/30/2013 1607   ALKPHOS 62 04/08/2012 1513   AST 16 09/30/2013 1607   AST 17 04/08/2012 1513   ALT 13 09/30/2013 1607   ALT 14 04/08/2012 1513   BILITOT 1.29* 09/30/2013 1607   BILITOT 1.0 04/08/2012 1513       Radiological Studies: Dg Bone Survey Met  09/02/2013   CLINICAL DATA:  History of multiple myeloma, back pain.  EXAM: METASTATIC BONE SURVEY  COMPARISON:  September 10, 2012.  FINDINGS: Within the skull lucencies and been previously demonstrated. These appear larger and more numerous today. The cervical vertebral bodies are preserved in height. There is degenerative disc change in the mid upper cervical spine which appears stable. No definite abnormal lucencies are demonstrated within the Send right humerus or pectoral girdle. On the left there are subtle lucencies noted at the level of the neck of the humerus which were not clearly evident previously. The thoracic and lumbar spine exhibit no compression fractures. There is gentle dextroscoliosis of the thoracolumbar spine that is stable. There is degenerative disc change at lower lumbar levels which is stable. The bony pelvis is osteopenic. There is a stable curvilinear ovoid  calcific density projecting over the upper aspect of the right sacral ala and over medial aspect of the right iliac bone. Considerable stool overlies the pelvis limiting evaluation of the bony structures. Definite abnormal lucencies are not demonstrated. No abnormal lucencies are demonstrated within the femurs.  IMPRESSION: 1. There are increased numbers of lytic lesions within the calvarium and the dominant lesion visible has slightly increased in size. There are also new subtle lucencies that have sclerotic margins in the left humeral neck which are nonspecific. 2. There are degenerative changes of the cervical, thoracic, and lumbar spines. There is mild stable dextroscoliosis of the lumbar spine. 3. The bony pelvis and hips exhibit no acute abnormalities but there is a stable curvilinear calcification projecting over the upper aspect of the right SI joint and the medial aspect of the right iliac wing.   Electronically Signed  By: David  Martinique   On: 09/02/2013 16:26    Impression: #1. Biclonal IgG and IgA multiple myeloma  She is now  breaking through pomalidomide with slowly rising total IgG and lambda free light chains. Hematologic profile otherwise stable. No significant bony disease.  I would like to give her a month holiday and then I'm going to offer her Velcade dexamethasone.   I was hoping the oral proteosome inhibitor would be available by now but it is not.  Daughter accompanies her on all visits and we reviewed Status and treatment plan    #2. History of perforated diverticulum requiring emergency surgery May 2012. Temporary colostomy now reversed. No new abdominal symptoms at this time.   #3. Essential hypertension   #4. History of psychotic episode in the past.    CC: Patient Care Team: Cassandria Anger, MD as PCP - General Annia Belt, MD (Hematology and Oncology) Lafayette Dragon, MD as Consulting Physician (Gastroenterology) Leonie Man, MD as Consulting Physician  (Cardiology)   Annia Belt, MD 1/2/20155:12 PM

## 2013-10-04 LAB — IMMUNOFIXATION ELECTROPHORESIS
IgA: 259 mg/dL (ref 69–380)
IgG (Immunoglobin G), Serum: 1570 mg/dL (ref 690–1700)
IgM, Serum: 21 mg/dL — ABNORMAL LOW (ref 52–322)
TOTAL PROTEIN, SERUM ELECTROPHOR: 7.9 g/dL (ref 6.0–8.3)

## 2013-10-04 LAB — KAPPA/LAMBDA LIGHT CHAINS
Kappa free light chain: 1.92 mg/dL (ref 0.33–1.94)
Kappa:Lambda Ratio: 0.05 — ABNORMAL LOW (ref 0.26–1.65)
Lambda Free Lght Chn: 40.9 mg/dL — ABNORMAL HIGH (ref 0.57–2.63)

## 2013-10-24 ENCOUNTER — Other Ambulatory Visit: Payer: Self-pay | Admitting: Internal Medicine

## 2013-10-28 ENCOUNTER — Other Ambulatory Visit (HOSPITAL_BASED_OUTPATIENT_CLINIC_OR_DEPARTMENT_OTHER): Payer: Medicare Other

## 2013-10-28 DIAGNOSIS — C9000 Multiple myeloma not having achieved remission: Secondary | ICD-10-CM

## 2013-10-28 LAB — COMPREHENSIVE METABOLIC PANEL (CC13)
ALT: 13 U/L (ref 0–55)
AST: 16 U/L (ref 5–34)
Albumin: 3.9 g/dL (ref 3.5–5.0)
Alkaline Phosphatase: 60 U/L (ref 40–150)
Anion Gap: 12 mEq/L — ABNORMAL HIGH (ref 3–11)
BUN: 12.4 mg/dL (ref 7.0–26.0)
CHLORIDE: 105 meq/L (ref 98–109)
CO2: 27 mEq/L (ref 22–29)
Calcium: 10.3 mg/dL (ref 8.4–10.4)
Creatinine: 1.1 mg/dL (ref 0.6–1.1)
Glucose: 153 mg/dl — ABNORMAL HIGH (ref 70–140)
Potassium: 3.5 mEq/L (ref 3.5–5.1)
Sodium: 144 mEq/L (ref 136–145)
Total Bilirubin: 1.03 mg/dL (ref 0.20–1.20)
Total Protein: 7.9 g/dL (ref 6.4–8.3)

## 2013-10-28 LAB — CBC WITH DIFFERENTIAL/PLATELET
BASO%: 0.5 % (ref 0.0–2.0)
Basophils Absolute: 0 10*3/uL (ref 0.0–0.1)
EOS%: 0.5 % (ref 0.0–7.0)
Eosinophils Absolute: 0 10*3/uL (ref 0.0–0.5)
HCT: 36.7 % (ref 34.8–46.6)
HGB: 11.8 g/dL (ref 11.6–15.9)
LYMPH#: 0.5 10*3/uL — AB (ref 0.9–3.3)
LYMPH%: 14.8 % (ref 14.0–49.7)
MCH: 31.3 pg (ref 25.1–34.0)
MCHC: 32.3 g/dL (ref 31.5–36.0)
MCV: 97.1 fL (ref 79.5–101.0)
MONO#: 0.2 10*3/uL (ref 0.1–0.9)
MONO%: 6.4 % (ref 0.0–14.0)
NEUT#: 2.4 10*3/uL (ref 1.5–6.5)
NEUT%: 77.8 % — ABNORMAL HIGH (ref 38.4–76.8)
Platelets: 224 10*3/uL (ref 145–400)
RBC: 3.78 10*6/uL (ref 3.70–5.45)
RDW: 13.8 % (ref 11.2–14.5)
WBC: 3.1 10*3/uL — ABNORMAL LOW (ref 3.9–10.3)

## 2013-11-01 LAB — KAPPA/LAMBDA LIGHT CHAINS
Kappa free light chain: 2.01 mg/dL — ABNORMAL HIGH (ref 0.33–1.94)
Kappa:Lambda Ratio: 0.04 — ABNORMAL LOW (ref 0.26–1.65)
LAMBDA FREE LGHT CHN: 47.1 mg/dL — AB (ref 0.57–2.63)

## 2013-11-01 LAB — IMMUNOFIXATION ELECTROPHORESIS
IGA: 223 mg/dL (ref 69–380)
IGG (IMMUNOGLOBIN G), SERUM: 1470 mg/dL (ref 690–1700)
IgM, Serum: 17 mg/dL — ABNORMAL LOW (ref 52–322)
Total Protein, Serum Electrophoresis: 7.4 g/dL (ref 6.0–8.3)

## 2013-11-04 ENCOUNTER — Telehealth: Payer: Self-pay | Admitting: *Deleted

## 2013-11-04 NOTE — Telephone Encounter (Signed)
Message copied by Jesse Fall on Fri Nov 04, 2013 10:27 AM ------      Message from: Annia Belt      Created: Wed Nov 02, 2013 10:21 AM       Call pt: antibody levels overall stable compared with last 2  times we checked ------

## 2013-11-04 NOTE — Telephone Encounter (Signed)
Received return call from daughter, Ebony Hail from message left 2 days ago.  Informed of antibody levels per Dr Beryle Beams.  She reports that Dr. Beryle Beams is thinking of changing mom's meds to velcade & they will see Dr. Beryle Beams on Friday.

## 2013-11-11 ENCOUNTER — Ambulatory Visit (HOSPITAL_BASED_OUTPATIENT_CLINIC_OR_DEPARTMENT_OTHER): Payer: Medicare Other | Admitting: Oncology

## 2013-11-11 ENCOUNTER — Telehealth: Payer: Self-pay | Admitting: Oncology

## 2013-11-11 VITALS — BP 139/61 | HR 98 | Temp 97.1°F | Resp 17 | Ht 62.0 in | Wt 99.3 lb

## 2013-11-11 DIAGNOSIS — C9000 Multiple myeloma not having achieved remission: Secondary | ICD-10-CM

## 2013-11-11 DIAGNOSIS — I10 Essential (primary) hypertension: Secondary | ICD-10-CM

## 2013-11-11 NOTE — Telephone Encounter (Signed)
gave appt for lab and MD emailed Dr Darnell Level regardin labs for [facility] 2015

## 2013-11-11 NOTE — Progress Notes (Signed)
Hematology and Oncology Follow Up Visit  Victoria [name] [id number] May 26, 1932 80 y.o. 11/11/2013 6:29 PM   Principle Diagnosis: Encounter Diagnosis  Name Primary?  . MULTIPLE  MYELOMA Yes     Interim History:    Short interval followup visit for this pleasant but frail 80 year old woman (daughter feels that she is actually 61 years old) with biclonal IgG and IgA lambda multiple myeloma initially diagnosed in October, 2008. Initial treatment with oral Alkeran, prednisone, and Revlimid. Revlimid stopped early on due to a dysphoric reaction. She was treated for 6 months. She reached a stable plateau and had a long treatment free interval. Last cycle given in October 2009. Secondary to rising paraprotein levels, treatment was reinitiated with oral pomalidomide in February 2014. Initial dose 2 mg 21 days on 7 day rest. She tolerated this well and was escalated to 3 mg  is . There was a slow but steady improvement in her paraprotein levels until recently. Trend over the 3 months prior to her December 2014 visit here was for slow but progressive rise in total IgG and lambda free light chains. Nadir IgG 1090 mg percent on 04/08/2013. Value of 1620 mg percent on 09/09/2013.   a CBC has remained stable over this interval and hemoglobin today is 11.8 with white count 3,100 and platelet count 224,000.  I repeated a skeletal bone survey on December 5. There may be a few more lesions in her calvarium which are asymptomatic. Vague lucencies left proximal humerus not clearly seen on prior study. Also asymptomatic.   I elected to hold her pomalidomide time of her visit here on 09/02/2013.  If anything, her total IgG level has trended downward and was 1470 mg percent on January 30 with a stable IgA level of 223 mg percent. Although there does seem to be pretty good correlation between lambda free light chains and total serum IgG which both arise together, lambda free light chains have remained stable over the last  3 months.  She's had no interim medical problems. No infections.  Medications: reviewed  Allergies:  Allergies  Allergen Reactions  . Amiodarone     rash  . Amoxicillin Hives and Itching  . Aspirin   . Atenolol     Hair loss    Review of Systems: Hematology:  No bleeding ENT ROS: No sore throat Breast ROS:  Respiratory ROS: No cough or dyspnea Cardiovascular ROS:  No chest pain or palpitations Gastrointestinal ROS: No abdominal pain   Genito-Urinary ROS: Not questioned  Musculoskeletal ROS: No bone pain Neurological ROS: No headache or change in vision Dermatological ROS: No rash or ecchymosis Remaining ROS negative:   Physical Exam: Blood pressure 139/61, pulse 98, temperature 97.1 F (36.2 C), temperature source Oral, resp. rate 17, height $RemoveBe'5\' 2"'vhRJLetRp$  (1.575 m), weight 99 lb 4.8 oz (45.042 kg), SpO2 100.00%. Wt Readings from Last 3 Encounters:  11/11/13 99 lb 4.8 oz (45.042 kg)  09/30/13 107 lb 11.2 oz (48.852 kg)  07/29/13 103 lb 6.4 oz (46.902 kg)     General appearance: Frail, elderly, African American woman HENNT: Pharynx no erythema, exudate, mass, or ulcer. No thyromegaly or thyroid nodules Lymph nodes: No cervical, supraclavicular, or axillary lymphadenopathy Breasts:  Lungs: Clear to auscultation, resonant to percussion throughout Heart: Regular rhythm, no murmur, no gallop, no rub, no click, no edema Abdomen: Soft, nontender, normal bowel sounds, no mass, no organomegaly Extremities: No edema, no calf tenderness Musculoskeletal: no joint deformities GU:  Vascular: Carotid pulses 2+, no bruits,  Neurologic: Alert, oriented, PERRLA,  , cranial nerves grossly normal, motor strength 5 over 5, reflexes 1+ symmetric, upper body coordination normal, gait normal, Skin: No rash or ecchymosis  Lab Results: CBC W/Diff    Component Value Date/Time   WBC 3.1* 10/28/2013 1614   WBC 4.3 06/28/2011 0425   RBC 3.78 10/28/2013 1614   RBC 3.33* 06/28/2011 0425   HGB 11.8  10/28/2013 1614   HGB 10.8* 06/30/2011 0350   HCT 36.7 10/28/2013 1614   HCT 31.2* 06/28/2011 0425   PLT 224 10/28/2013 1614   PLT 154 06/28/2011 0425   MCV 97.1 10/28/2013 1614   MCV 93.7 06/28/2011 0425   MCH 31.3 10/28/2013 1614   MCH 30.0 06/28/2011 0425   MCHC 32.3 10/28/2013 1614   MCHC 32.1 06/28/2011 0425   RDW 13.8 10/28/2013 1614   RDW 14.2 06/28/2011 0425   LYMPHSABS 0.5* 10/28/2013 1614   LYMPHSABS 0.3* 02/23/2011 0536   MONOABS 0.2 10/28/2013 1614   MONOABS 0.5 02/23/2011 0536   EOSABS 0.0 10/28/2013 1614   EOSABS 0.0 02/23/2011 0536   BASOSABS 0.0 10/28/2013 1614   BASOSABS 0.0 02/23/2011 0536     Chemistry      Component Value Date/Time   NA 144 10/28/2013 1615   NA 139 04/08/2012 1513   K 3.5 10/28/2013 1615   K 3.3* 04/08/2012 1513   CL 103 03/11/2013 1503   CL 99 04/08/2012 1513   CO2 27 10/28/2013 1615   CO2 30 04/08/2012 1513   BUN 12.4 10/28/2013 1615   BUN 18 04/08/2012 1513   CREATININE 1.1 10/28/2013 1615   CREATININE 0.75 04/08/2012 1513      Component Value Date/Time   CALCIUM 10.3 10/28/2013 1615   CALCIUM 10.5 04/08/2012 1513   ALKPHOS 60 10/28/2013 1615   ALKPHOS 62 04/08/2012 1513   AST 16 10/28/2013 1615   AST 17 04/08/2012 1513   ALT 13 10/28/2013 1615   ALT 14 04/08/2012 1513   BILITOT 1.03 10/28/2013 1615   BILITOT 1.0 04/08/2012 1513       Impression:  #1. Biclonal IgG and IgA lambda multiple myeloma treated as outlined above. Initial prolonged response to oral Alkeran and prednisone less than 5 years. Short response to pomalidomide started February 2014 resting about one year. Paradoxically, since I stopped the drug in December, a total IgG level has fallen and lambda free light chains have remained stable.  Given the indolent nature of her disease, I'm going to continue close observation alone at this time and reinstitute therapy if there is a persistent rise in her IgG level over 2.5 g or rise in her IgA over 500 mg.  #2. History of perforated diverticulum  requiring emergency surgery May 2012. Temporary colostomy now reversed. No new abdominal symptoms at this time.   #3. Essential hypertension   #4. History of psychotic episode in the past.  I will transition her care to Dr. Alvy Bimler after her next visit with me.     CC: Patient Care Team: Cassandria Anger, MD as PCP - General Annia Belt, MD (Hematology and Oncology) Lafayette Dragon, MD as Consulting Physician (Gastroenterology) Leonie Man, MD as Consulting Physician (Cardiology)   Annia Belt, MD 2/13/20156:29 PM

## 2013-11-14 ENCOUNTER — Telehealth: Payer: Self-pay | Admitting: Oncology

## 2013-11-14 NOTE — Telephone Encounter (Signed)
Gave pt appt for lab on 2/27

## 2013-11-24 ENCOUNTER — Telehealth: Payer: Self-pay | Admitting: Oncology

## 2013-11-24 NOTE — Telephone Encounter (Signed)
pt called to r/s 2/27 lb to 3/5. pt has new d/t. also confirmed 3/20 f/u.

## 2013-11-25 ENCOUNTER — Other Ambulatory Visit: Payer: Medicare Other

## 2013-11-30 ENCOUNTER — Other Ambulatory Visit: Payer: Self-pay | Admitting: Internal Medicine

## 2013-12-02 ENCOUNTER — Other Ambulatory Visit (HOSPITAL_BASED_OUTPATIENT_CLINIC_OR_DEPARTMENT_OTHER): Payer: Medicare Other

## 2013-12-02 DIAGNOSIS — C9000 Multiple myeloma not having achieved remission: Secondary | ICD-10-CM

## 2013-12-02 LAB — CBC WITH DIFFERENTIAL/PLATELET
BASO%: 1 % (ref 0.0–2.0)
Basophils Absolute: 0 10*3/uL (ref 0.0–0.1)
EOS ABS: 0 10*3/uL (ref 0.0–0.5)
EOS%: 0.7 % (ref 0.0–7.0)
HCT: 37.3 % (ref 34.8–46.6)
HGB: 11.8 g/dL (ref 11.6–15.9)
LYMPH%: 21 % (ref 14.0–49.7)
MCH: 30.9 pg (ref 25.1–34.0)
MCHC: 31.7 g/dL (ref 31.5–36.0)
MCV: 97.5 fL (ref 79.5–101.0)
MONO#: 0.2 10*3/uL (ref 0.1–0.9)
MONO%: 7.9 % (ref 0.0–14.0)
NEUT#: 2.1 10*3/uL (ref 1.5–6.5)
NEUT%: 69.4 % (ref 38.4–76.8)
PLATELETS: 181 10*3/uL (ref 145–400)
RBC: 3.82 10*6/uL (ref 3.70–5.45)
RDW: 14.3 % (ref 11.2–14.5)
WBC: 3.1 10*3/uL — ABNORMAL LOW (ref 3.9–10.3)
lymph#: 0.6 10*3/uL — ABNORMAL LOW (ref 0.9–3.3)

## 2013-12-02 LAB — COMPREHENSIVE METABOLIC PANEL (CC13)
ALT: 16 U/L (ref 0–55)
ANION GAP: 11 meq/L (ref 3–11)
AST: 19 U/L (ref 5–34)
Albumin: 3.9 g/dL (ref 3.5–5.0)
Alkaline Phosphatase: 61 U/L (ref 40–150)
BUN: 14.6 mg/dL (ref 7.0–26.0)
CALCIUM: 10.2 mg/dL (ref 8.4–10.4)
CO2: 30 meq/L — AB (ref 22–29)
Chloride: 106 mEq/L (ref 98–109)
Creatinine: 0.8 mg/dL (ref 0.6–1.1)
GLUCOSE: 93 mg/dL (ref 70–140)
Potassium: 3.2 mEq/L — ABNORMAL LOW (ref 3.5–5.1)
Sodium: 147 mEq/L — ABNORMAL HIGH (ref 136–145)
TOTAL PROTEIN: 8.5 g/dL — AB (ref 6.4–8.3)
Total Bilirubin: 0.89 mg/dL (ref 0.20–1.20)

## 2013-12-06 ENCOUNTER — Other Ambulatory Visit: Payer: Self-pay | Admitting: Internal Medicine

## 2013-12-07 LAB — IMMUNOFIXATION ELECTROPHORESIS
IGG (IMMUNOGLOBIN G), SERUM: 2070 mg/dL — AB (ref 690–1700)
IgA: 251 mg/dL (ref 69–380)
IgM, Serum: 17 mg/dL — ABNORMAL LOW (ref 52–322)
TOTAL PROTEIN, SERUM ELECTROPHOR: 8.2 g/dL (ref 6.0–8.3)

## 2013-12-07 LAB — KAPPA/LAMBDA LIGHT CHAINS
KAPPA LAMBDA RATIO: 0.05 — AB (ref 0.26–1.65)
Kappa free light chain: 2.11 mg/dL — ABNORMAL HIGH (ref 0.33–1.94)
Lambda Free Lght Chn: 46.6 mg/dL — ABNORMAL HIGH (ref 0.57–2.63)

## 2013-12-08 ENCOUNTER — Other Ambulatory Visit: Payer: Self-pay | Admitting: Internal Medicine

## 2013-12-09 ENCOUNTER — Encounter: Payer: Self-pay | Admitting: Internal Medicine

## 2013-12-09 ENCOUNTER — Other Ambulatory Visit: Payer: Medicare Other

## 2013-12-09 ENCOUNTER — Ambulatory Visit (INDEPENDENT_AMBULATORY_CARE_PROVIDER_SITE_OTHER): Payer: Medicare Other | Admitting: Internal Medicine

## 2013-12-09 VITALS — BP 140/72 | HR 75 | Temp 98.5°F | Wt 102.0 lb

## 2013-12-09 DIAGNOSIS — R279 Unspecified lack of coordination: Secondary | ICD-10-CM

## 2013-12-09 DIAGNOSIS — I10 Essential (primary) hypertension: Secondary | ICD-10-CM

## 2013-12-09 DIAGNOSIS — R197 Diarrhea, unspecified: Secondary | ICD-10-CM

## 2013-12-09 MED ORDER — PHOSPHATIDYLSERINE-DHA-EPA 100-19.5-6.5 MG PO CAPS: 1.0000 | ORAL_CAPSULE | Freq: Every day | ORAL | Status: DC

## 2013-12-09 MED ORDER — LORAZEPAM 1 MG PO TABS: ORAL_TABLET | ORAL | Status: DC

## 2013-12-09 MED ORDER — VITAMIN D 1000 UNITS PO TABS: 1000.0000 [IU] | ORAL_TABLET | Freq: Every day | ORAL | Status: DC

## 2013-12-09 MED ORDER — DICYCLOMINE HCL 10 MG PO CAPS: ORAL_CAPSULE | ORAL | Status: DC

## 2013-12-09 MED ORDER — TRAMADOL HCL 50 MG PO TABS: ORAL_TABLET | ORAL | Status: DC

## 2013-12-09 NOTE — Assessment & Plan Note (Addendum)
Try Welchol Avoid milk

## 2013-12-09 NOTE — Progress Notes (Signed)
Pre visit review using our clinic review tool, if applicable. No additional management support is needed unless otherwise documented below in the visit note. 

## 2013-12-09 NOTE — Patient Instructions (Signed)
Try Welchol 1 once or twice a day for diarrhea

## 2013-12-09 NOTE — Progress Notes (Signed)
   Subjective:    HPI The patient presents for a follow-up of  chronic hypertension, chronic dyslipidemia, post-op issues, controlled with medicines  Wt Readings from Last 3 Encounters:  12/09/13 102 lb (46.267 kg)  11/11/13 99 lb 4.8 oz (45.042 kg)  09/30/13 107 lb 11.2 oz (48.852 kg)    BP Readings from Last 3 Encounters:  12/09/13 140/72  11/11/13 139/61  09/30/13 136/67      Review of Systems  Constitutional: Negative for chills, activity change, appetite change, fatigue and unexpected weight change.  HENT: Negative for congestion, mouth sores and sinus pressure.   Eyes: Negative for visual disturbance.  Respiratory: Negative for cough and chest tightness.   Gastrointestinal: Negative for nausea and abdominal pain.  Genitourinary: Negative for frequency, difficulty urinating and vaginal pain.  Musculoskeletal: Negative for back pain and gait problem.  Skin: Negative for pallor and rash.  Neurological: Negative for dizziness, tremors, weakness, numbness and headaches.  Psychiatric/Behavioral: Negative for confusion and sleep disturbance. The patient is nervous/anxious.        Objective:   Physical Exam  Constitutional: She appears well-developed. No distress.  thin  HENT:  Head: Normocephalic.  Right Ear: External ear normal.  Left Ear: External ear normal.  Nose: Nose normal.  Mouth/Throat: Oropharynx is clear and moist.  Eyes: Conjunctivae are normal. Pupils are equal, round, and reactive to light. Right eye exhibits no discharge. Left eye exhibits no discharge.  Neck: Normal range of motion. Neck supple. No JVD present. No tracheal deviation present. No thyromegaly present.  Cardiovascular: Normal rate, regular rhythm and normal heart sounds.   Pulmonary/Chest: No stridor. No respiratory distress. She has no wheezes.  Abdominal: Soft. Bowel sounds are normal. She exhibits no distension and no mass. There is no tenderness. There is no rebound and no guarding.   Musculoskeletal: She exhibits no edema and no tenderness.  Lymphadenopathy:    She has no cervical adenopathy.  Neurological: She displays normal reflexes. No cranial nerve deficit. She exhibits normal muscle tone. Coordination normal.  Skin: No rash noted. No erythema.  Psychiatric: She has a normal mood and affect. Her behavior is normal. Judgment and thought content normal.   Lab Results  Component Value Date   WBC 3.1* 12/02/2013   HGB 11.8 12/02/2013   HCT 37.3 12/02/2013   PLT 181 12/02/2013   GLUCOSE 93 12/02/2013   CHOL 158 03/09/2007   TRIG 37 03/09/2007   HDL 58.8 03/09/2007   LDLCALC 92 03/09/2007   ALT 16 12/02/2013   AST 19 12/02/2013   NA 147* 12/02/2013   K 3.2* 12/02/2013   CL 103 03/11/2013   CREATININE 0.8 12/02/2013   BUN 14.6 12/02/2013   CO2 30* 12/02/2013   TSH 1.127 02/21/2011   INR 1.19 02/21/2011   HGBA1C 5.3 03/09/2007          Assessment & Plan:

## 2013-12-09 NOTE — Assessment & Plan Note (Signed)
Continue with current prescription therapy as reflected on the Med list.  

## 2013-12-11 NOTE — Assessment & Plan Note (Signed)
Chronic. 

## 2013-12-12 ENCOUNTER — Telehealth: Payer: Self-pay | Admitting: Internal Medicine

## 2013-12-12 NOTE — Telephone Encounter (Signed)
Relevant patient education assigned to patient using Emmi. ° °

## 2013-12-16 ENCOUNTER — Ambulatory Visit (HOSPITAL_BASED_OUTPATIENT_CLINIC_OR_DEPARTMENT_OTHER): Payer: Medicare Other | Admitting: Oncology

## 2013-12-16 VITALS — BP 146/67 | HR 89 | Temp 98.1°F | Resp 18 | Ht 62.0 in | Wt 106.7 lb

## 2013-12-16 DIAGNOSIS — C9000 Multiple myeloma not having achieved remission: Secondary | ICD-10-CM

## 2013-12-16 DIAGNOSIS — I10 Essential (primary) hypertension: Secondary | ICD-10-CM

## 2013-12-18 NOTE — Progress Notes (Signed)
Hematology and Oncology Follow Up Visit  Victoria [name] [id number] [birth year] 80 y.o. 12/18/2013 12:43 PM   Principle Diagnosis: Encounter Diagnosis  Name Primary?  . MULTIPLE  MYELOMA Yes     Interim History:   Short interim followup visit for this soon to be 80 year old woman with biclonal IgG and IgA lambda multiple myeloma. She wasinitially diagnosed in October, 2008. Initial treatment with oral Alkeran, prednisone, and Revlimid. Revlimid stopped early on due to a dysphoric reaction. She was treated for 6 months. She reached a stable plateau and had a long treatment free interval. Last cycle given in October 2009. Secondary to rising paraprotein levels, treatment was reinitiated with oral pomalidomide in February 2014. Initial dose 2 mg 21 days on 7 day rest. She tolerated this well and was escalated to 3 mg . There was a slow but steady improvement in her paraprotein levels until recently. Trend over the 3 months prior to her December 2014 visit here was for slow but progressive rise in total IgG and lambda free light chains. Nadir IgG 1090 mg percent on 04/08/2013. Value of 1620 mg percent on 09/09/2013.   CBC has remained stable over this interval and hemoglobin today is 11.8 with white count 3,100 and platelet count 181,000.  I repeated a skeletal bone survey on December 5. There may be a few more lesions in her calvarium which are asymptomatic. Vague lucencies left proximal humerus not clearly seen on prior study. Also asymptomatic.  I elected to hold her pomalidomide time of her visit here on 09/02/2013.  Initially, her total IgG level  trended downward and was 1470 mg percent on January 30 with a stable IgA level of 223 mg percent. Today's value is up again at 2070 mg percent done on 12/02/2013. IgA is stable compare with recent baseline at 251 mg percent. Although there does seem to be pretty good correlation between lambda free light chains and total serum IgG which both rise together,  lambda free light chains have remained stable over the last 3 months. Current value 46.6 mg percent compared to 47.1 in January, 42.2 in December. She has no new symptoms today. No interim infections. She is sad that I am leaving the practice and was very tearful.   Medications: reviewed  Allergies:  Allergies  Allergen Reactions  . Amiodarone     rash  . Amoxicillin Hives and Itching  . Aspirin   . Atenolol     Hair loss    Review of Systems: Hematology:  No bleeding or bruising ENT ROS: No sore throat Breast ROS:  Respiratory ROS: No cough or dyspnea  Cardiovascular ROS:  No chest pain or palpitations Gastrointestinal ROS: No abdominal pain or change in bowel habit   Genito-Urinary ROS: Not questioned Musculoskeletal ROS: No muscle bone or joint pain Neurological ROS: No headache or change in vision Dermatological ROS: No rash Remaining ROS negative:   Physical Exam: Blood pressure 146/67, pulse 89, temperature 98.1 F (36.7 C), temperature source Oral, resp. rate 18, height _0  (1.575 m), weight 106 lb 11.2 oz (48.399 kg), SpO2 97.00%. Wt Readings from Last 3 Encounters:  12/16/13 106 lb 11.2 oz (48.399 kg)  12/09/13 102 lb (46.267 kg)  11/11/13 99 lb 4.8 oz (45.042 kg)     General appearance: Frail African American woman HENNT: Pharynx no erythema, exudate, mass, or ulcer. No thyromegaly or thyroid nodules Lymph nodes: No cervical, supraclavicular, or axillary lymphadenopathy Breasts:  Lungs: Clear to auscultation, resonant to percussion throughout  Heart: Regular rhythm, no murmur, no gallop, no rub, no click, no edema Abdomen: Soft, nontender, normal bowel sounds, no mass, no organomegaly Extremities: No edema, no calf tenderness Musculoskeletal: no joint deformities GU:  Vascular: Carotid pulses 2+, no bruits,  Neurologic: Alert, oriented, PERRLA, , cranial nerves grossly normal, motor strength 5 over 5, reflexes 1+ symmetric, upper body coordination normal,  gait normal, Skin: No rash or ecchymosis  Lab Results: CBC W/Diff    Component Value Date/Time   WBC 3.1* 12/02/2013 1508   WBC 4.3 06/28/2011 0425   RBC 3.82 12/02/2013 1508   RBC 3.33* 06/28/2011 0425   HGB 11.8 12/02/2013 1508   HGB 10.8* 06/30/2011 0350   HCT 37.3 12/02/2013 1508   HCT 31.2* 06/28/2011 0425   PLT 181 12/02/2013 1508   PLT 154 06/28/2011 0425   MCV 97.5 12/02/2013 1508   MCV 93.7 06/28/2011 0425   MCH 30.9 12/02/2013 1508   MCH 30.0 06/28/2011 0425   MCHC 31.7 12/02/2013 1508   MCHC 32.1 06/28/2011 0425   RDW 14.3 12/02/2013 1508   RDW 14.2 06/28/2011 0425   LYMPHSABS 0.6* 12/02/2013 1508   LYMPHSABS 0.3* 02/23/2011 0536   MONOABS 0.2 12/02/2013 1508   MONOABS 0.5 02/23/2011 0536   EOSABS 0.0 12/02/2013 1508   EOSABS 0.0 02/23/2011 0536   BASOSABS 0.0 12/02/2013 1508   BASOSABS 0.0 02/23/2011 0536     Chemistry      Component Value Date/Time   NA 147* 12/02/2013 1508   NA 139 04/08/2012 1513   K 3.2* 12/02/2013 1508   K 3.3* 04/08/2012 1513   CL 103 03/11/2013 1503   CL 99 04/08/2012 1513   CO2 30* 12/02/2013 1508   CO2 30 04/08/2012 1513   BUN 14.6 12/02/2013 1508   BUN 18 04/08/2012 1513   CREATININE 0.8 12/02/2013 1508   CREATININE 0.75 04/08/2012 1513      Component Value Date/Time   CALCIUM 10.2 12/02/2013 1508   CALCIUM 10.5 04/08/2012 1513   ALKPHOS 61 12/02/2013 1508   ALKPHOS 62 04/08/2012 1513   AST 19 12/02/2013 1508   AST 17 04/08/2012 1513   ALT 16 12/02/2013 1508   ALT 14 04/08/2012 1513   BILITOT 0.89 12/02/2013 1508   BILITOT 1.0 04/08/2012 1513       Impression:  #1. Biclonal IgG and IgA lambda multiple myeloma treated as outlined above. Initial prolonged response to oral Alkeran and prednisone lasting  5 years. Short response to pomalidomide started February 2014 lasting about one year. Paradoxically, since I stopped the drug in December,  total IgG level has fallen initially and lambda free light chains have remained stable.  Given the indolent nature of her disease, despite  increased total IgG value on 12/02/13,  I'm going to continue close observation alone at this time and reinstitute therapy if there is a persistent rise in her IgG level over 2.5 g or rise in her IgA over 500 mg.   #2. History of perforated diverticulum requiring emergency surgery May 2012. Temporary colostomy now reversed. No new abdominal symptoms at this time.   #3. Essential hypertension   #4. History of psychotic episode in the past.   I will transition her care to Dr. Alvy Bimler    CC: Patient Care Team: Cassandria Anger, MD as PCP - General Annia Belt, MD (Hematology and Oncology) Lafayette Dragon, MD as Consulting Physician (Gastroenterology) Leonie Man, MD as Consulting Physician (Cardiology)   Annia Belt, MD 3/22/201512:43  PM

## 2013-12-19 ENCOUNTER — Telehealth: Payer: Self-pay | Admitting: Oncology

## 2013-12-19 NOTE — Telephone Encounter (Signed)
, °

## 2013-12-23 ENCOUNTER — Other Ambulatory Visit: Payer: Medicare Other

## 2014-01-02 ENCOUNTER — Telehealth: Payer: Self-pay | Admitting: *Deleted

## 2014-01-02 NOTE — Telephone Encounter (Signed)
Can try Pamelor 10 mg 1 po qhs Thx

## 2014-01-02 NOTE — Telephone Encounter (Signed)
Victoria [name] called states pt is having difficulty sleeping, requesting a sleep aid.  Please advise

## 2014-01-03 MED ORDER — NORTRIPTYLINE HCL 10 MG PO CAPS: 10.0000 mg | ORAL_CAPSULE | Freq: Every day | ORAL | Status: DC

## 2014-01-03 NOTE — Telephone Encounter (Signed)
Left message on Vm. Rx sent to pharmacy.  

## 2014-01-05 ENCOUNTER — Telehealth: Payer: Self-pay | Admitting: *Deleted

## 2014-01-05 NOTE — Telephone Encounter (Signed)
Pt could use Lorazepam 1.5-2 tabs at hs (instead of 1 tab) prn Thx

## 2014-01-05 NOTE — Telephone Encounter (Signed)
Pts daughter, Ebony Hail, called states pt had an adverse reaction to the Nortriptyline Rx.  States pt is not able to sleep when taking it and pt becomes agitated.  Please advise

## 2014-01-06 ENCOUNTER — Telehealth: Payer: Self-pay | Admitting: Hematology and Oncology

## 2014-01-06 ENCOUNTER — Other Ambulatory Visit: Payer: Medicare Other

## 2014-01-06 NOTE — Telephone Encounter (Signed)
Pt's daughter called and r/s labs from 4/17 to 428

## 2014-01-09 NOTE — Telephone Encounter (Signed)
Spoke with Ebony Hail advised of MDs message.

## 2014-01-13 ENCOUNTER — Other Ambulatory Visit: Payer: Medicare Other

## 2014-01-20 ENCOUNTER — Other Ambulatory Visit (HOSPITAL_BASED_OUTPATIENT_CLINIC_OR_DEPARTMENT_OTHER): Payer: Medicare Other

## 2014-01-20 DIAGNOSIS — C9000 Multiple myeloma not having achieved remission: Secondary | ICD-10-CM

## 2014-01-20 DIAGNOSIS — I10 Essential (primary) hypertension: Secondary | ICD-10-CM

## 2014-01-20 LAB — COMPREHENSIVE METABOLIC PANEL
ALBUMIN: 3.7 g/dL (ref 3.5–5.2)
ALK PHOS: 60 U/L (ref 39–117)
ALT: 21 U/L (ref 0–35)
AST: 22 U/L (ref 0–37)
BUN: 16 mg/dL (ref 6–23)
CO2: 25 mEq/L (ref 19–32)
Calcium: 10.3 mg/dL (ref 8.4–10.5)
Chloride: 97 mEq/L (ref 96–112)
Creatinine, Ser: 0.71 mg/dL (ref 0.50–1.10)
Glucose, Bld: 184 mg/dL — ABNORMAL HIGH (ref 70–99)
POTASSIUM: 3.2 meq/L — AB (ref 3.5–5.3)
SODIUM: 141 meq/L (ref 135–145)
Total Bilirubin: 0.7 mg/dL (ref 0.3–1.2)
Total Protein: 8 g/dL (ref 6.0–8.3)

## 2014-01-24 LAB — IMMUNOFIXATION ELECTROPHORESIS
IgA: 288 mg/dL (ref 69–380)
IgG (Immunoglobin G), Serum: 2040 mg/dL — ABNORMAL HIGH (ref 690–1700)
IgM, Serum: 18 mg/dL — ABNORMAL LOW (ref 52–322)
Total Protein, Serum Electrophoresis: 8 g/dL (ref 6.0–8.3)

## 2014-01-24 LAB — KAPPA/LAMBDA LIGHT CHAINS
Kappa free light chain: 1.96 mg/dL — ABNORMAL HIGH (ref 0.33–1.94)
Kappa:Lambda Ratio: 0.03 — ABNORMAL LOW (ref 0.26–1.65)
LAMBDA FREE LGHT CHN: 65.8 mg/dL — AB (ref 0.57–2.63)

## 2014-02-01 ENCOUNTER — Telehealth: Payer: Self-pay | Admitting: Internal Medicine

## 2014-02-01 ENCOUNTER — Encounter: Payer: Self-pay | Admitting: *Deleted

## 2014-02-01 NOTE — Telephone Encounter (Signed)
Spoke with patient's daughter and gave her Dr. Nichola Sizer recommendation to go to ED.

## 2014-02-01 NOTE — Telephone Encounter (Signed)
OK to see P.Guenther on 02/02/2014

## 2014-02-01 NOTE — Telephone Encounter (Signed)
Spoke with daughter and she went to see her mother. Patient went to the bathroom and the blood was a small amount and bright, red. Denies pain, fever, SOB or weakness. She states she does not want to take her to ED. She wants to schedule OV. She understands to take her mother to ED if she has any maroon blood, weakness, SOB, pain or fever. Scheduled OV with Tye Savoy, NP on 02/02/14 at 1:30 pm.

## 2014-02-01 NOTE — Telephone Encounter (Signed)
Left a message a for patient's caregiver to call me.

## 2014-02-01 NOTE — Telephone Encounter (Signed)
Left a message again for caregiver to call me.

## 2014-02-01 NOTE — Telephone Encounter (Signed)
Spoke with patient's caregiver and patient had 1/2 to 1 cup of blood in her stool this AM. Caregiver states the blood was dark in color not bright red. She states her mother had abdominal pain several days ago but none today. Spoke with Dr. Olevia Perches and patient needs to go to ED for evaluation.Left a message for patient's caregiver to call me ASAP.

## 2014-02-02 ENCOUNTER — Encounter: Payer: Self-pay | Admitting: Nurse Practitioner

## 2014-02-02 ENCOUNTER — Ambulatory Visit (INDEPENDENT_AMBULATORY_CARE_PROVIDER_SITE_OTHER): Payer: Medicare Other | Admitting: Nurse Practitioner

## 2014-02-02 VITALS — BP 130/84 | HR 76 | Ht 62.0 in | Wt 108.4 lb

## 2014-02-02 DIAGNOSIS — K625 Hemorrhage of anus and rectum: Secondary | ICD-10-CM

## 2014-02-02 MED ORDER — HYDROCORTISONE 2.5 % RE CREA: TOPICAL_CREAM | RECTAL | Status: DC

## 2014-02-02 NOTE — Patient Instructions (Addendum)
You can purchase a Glycerin Suppository over the counter and use as needed for constipation.  We have sent the following medications to your pharmacy for you to pick up at your convenience: Proctosol 2.5 % cream, please apply to rectum once daily at bedtime for seven days

## 2014-02-03 ENCOUNTER — Ambulatory Visit: Payer: Medicare Other | Admitting: Internal Medicine

## 2014-02-03 ENCOUNTER — Encounter: Payer: Self-pay | Admitting: Nurse Practitioner

## 2014-02-03 DIAGNOSIS — K625 Hemorrhage of anus and rectum: Secondary | ICD-10-CM

## 2014-02-03 DIAGNOSIS — Z0289 Encounter for other administrative examinations: Secondary | ICD-10-CM

## 2014-02-03 HISTORY — DX: Hemorrhage of anus and rectum: K62.5

## 2014-02-03 NOTE — Progress Notes (Signed)
     History of Present Illness:  This is an 80 year-old female known to Dr. Olevia Perches. She has a history of diverticulitis and diverticular abscess necessitating exploratory laparotomy, segmental colon resection and diverting colostomy which was taken down in September 2010.    Patient comes in today for evaluation of rectal bleeding. She had an isolated episode of painless rectal bleeding a few days ago. The amount is described as small.   Current Medications, Allergies, Past Medical History, Past Surgical History, Family History and Social History were reviewed in [employer] record.   Physical Exam: General: Pleasant, well developed , black female in no acute distress Head: Normocephalic and atraumatic Eyes:  sclerae anicteric, conjunctiva pink  Ears: Normal auditory acuity Lungs: Clear throughout to auscultation Heart: Regular rate and rhythm Abdomen: Soft, non distended, non-tender. No masses, no hepatomegaly. Normal bowel sounds Rectal: no external lesions. No stool or masses felt in vault.  Musculoskeletal: Symmetrical with no gross deformities  Extremities: No edema  Neurological: Alert oriented x 4, grossly nonfocal Psychological:  Alert and cooperative. Normal mood and affect  Assessment and Recommendations: 49. 80 year old female with on isolated episode of painless rectal bleeding. Patient had a colonoscopy in July 2012 but not sure that rectum was visualized ( ? done through ileostomy). I suspect internal hemorrhoids caused episode of bleeding. Advised against straining. May use glycerin suppositories as needed for constipation. Will treat empirically with steroid cream. Patient to call for follow up if she has recurrent bleeding.   2. History of diverticulitis and diverticular abscess necessitating segmental colon resection and diverting colostomy which was taken down in September 2010.

## 2014-02-03 NOTE — Progress Notes (Signed)
Reviewed and agree. Colostomy was taken down in9/2012, and her last colonoscopy was in 2012  Via rectum.

## 2014-02-09 ENCOUNTER — Telehealth: Payer: Self-pay | Admitting: *Deleted

## 2014-02-09 NOTE — Telephone Encounter (Signed)
Completed handicap placard form mailed to pt.

## 2014-02-10 ENCOUNTER — Other Ambulatory Visit: Payer: Medicare Other

## 2014-02-10 ENCOUNTER — Ambulatory Visit: Payer: Medicare Other | Admitting: Hematology and Oncology

## 2014-02-17 ENCOUNTER — Encounter: Payer: Self-pay | Admitting: Hematology and Oncology

## 2014-02-17 ENCOUNTER — Ambulatory Visit (HOSPITAL_BASED_OUTPATIENT_CLINIC_OR_DEPARTMENT_OTHER): Payer: Medicare Other | Admitting: Hematology and Oncology

## 2014-02-17 ENCOUNTER — Other Ambulatory Visit (HOSPITAL_BASED_OUTPATIENT_CLINIC_OR_DEPARTMENT_OTHER): Payer: Medicare Other

## 2014-02-17 VITALS — BP 154/59 | HR 87 | Temp 97.1°F | Resp 19 | Ht 62.0 in | Wt 110.5 lb

## 2014-02-17 DIAGNOSIS — C9000 Multiple myeloma not having achieved remission: Secondary | ICD-10-CM

## 2014-02-17 DIAGNOSIS — D72819 Decreased white blood cell count, unspecified: Secondary | ICD-10-CM

## 2014-02-17 LAB — CBC WITH DIFFERENTIAL/PLATELET
BASO%: 1 % (ref 0.0–2.0)
BASOS ABS: 0 10*3/uL (ref 0.0–0.1)
EOS ABS: 0 10*3/uL (ref 0.0–0.5)
EOS%: 0.5 % (ref 0.0–7.0)
HCT: 36.7 % (ref 34.8–46.6)
HEMOGLOBIN: 11.7 g/dL (ref 11.6–15.9)
LYMPH%: 18.2 % (ref 14.0–49.7)
MCH: 31.3 pg (ref 25.1–34.0)
MCHC: 31.9 g/dL (ref 31.5–36.0)
MCV: 97.9 fL (ref 79.5–101.0)
MONO#: 0.3 10*3/uL (ref 0.1–0.9)
MONO%: 8.6 % (ref 0.0–14.0)
NEUT%: 71.7 % (ref 38.4–76.8)
NEUTROS ABS: 2.5 10*3/uL (ref 1.5–6.5)
PLATELETS: 204 10*3/uL (ref 145–400)
RBC: 3.75 10*6/uL (ref 3.70–5.45)
RDW: 13.4 % (ref 11.2–14.5)
WBC: 3.4 10*3/uL — ABNORMAL LOW (ref 3.9–10.3)
lymph#: 0.6 10*3/uL — ABNORMAL LOW (ref 0.9–3.3)

## 2014-02-17 LAB — COMPREHENSIVE METABOLIC PANEL (CC13)
ALK PHOS: 55 U/L (ref 40–150)
ALT: 20 U/L (ref 0–55)
AST: 22 U/L (ref 5–34)
Albumin: 3.7 g/dL (ref 3.5–5.0)
Anion Gap: 12 mEq/L — ABNORMAL HIGH (ref 3–11)
BUN: 16 mg/dL (ref 7.0–26.0)
CO2: 28 meq/L (ref 22–29)
CREATININE: 0.9 mg/dL (ref 0.6–1.1)
Calcium: 10.1 mg/dL (ref 8.4–10.4)
Chloride: 103 mEq/L (ref 98–109)
GLUCOSE: 115 mg/dL (ref 70–140)
Potassium: 3.4 mEq/L — ABNORMAL LOW (ref 3.5–5.1)
Sodium: 143 mEq/L (ref 136–145)
Total Bilirubin: 0.97 mg/dL (ref 0.20–1.20)
Total Protein: 8.3 g/dL (ref 6.4–8.3)

## 2014-02-17 NOTE — Progress Notes (Signed)
[facility] FOLLOW-UP progress notes  Patient Care Team: Cassandria Anger, MD as PCP - General Lafayette Dragon, MD as Consulting Physician (Gastroenterology) Leonie Man, MD as Consulting Physician (Cardiology) Heath Lark, MD as Consulting Physician (Hematology and Oncology)  CHIEF COMPLAINTS/PURPOSE OF VISIT:  Biclonal IgG Kappa and IgA lambda multiple myeloma HISTORY OF PRESENTING ILLNESS:   Victoria [name] 80 y.o. female was transferred to my care after her prior physician has left.  I reviewed the patient's records extensive and collaborated the history with the patient. She is a poor historian. Summary of her history is as follows: She was initially diagnosed in October, 2008. Initial treatment with oral Alkeran, prednisone, and Revlimid. Revlimid stopped early on due to a dysphoric reaction. She was treated for 6 months. She reached a stable plateau and had a long treatment free interval. Last cycle given in October 2009. Secondary to rising paraprotein levels, treatment was reinitiated with oral pomalidomide in February 2014. Initial dose 2 mg 21 days on 7 day rest. She tolerated this well and was escalated to 3 mg . There was a slow but steady improvement in her paraprotein levels. Due to various reasons, pomalidomide was held since 09/02/2013.   Since she was last seen here, she has no new complaints. No bone pain. Denies recent infection. She denies any recent fever, chills, night sweats or abnormal weight loss   MEDICAL HISTORY:  Past Medical History  Diagnosis Date  . Hypertension   . Insomnia   . Anxiety   . Hearing loss     bilateral hearing aids  . Anemia   . Multiple myeloma   . Diverticulosis 04/2001    SURGICAL HISTORY: Past Surgical History  Procedure Laterality Date  . Sigmoid resection / rectopexy  01/2011    perforation/stoma  . Colonoscopy    . Partial hysterectomy    . Cesarean section    . Colostomy takedown  06/27/11  .  Cholecystectomy  2012    laparoscopic    SOCIAL HISTORY: History   Social History  . Marital Status: Divorced    Spouse Name: N/A    Number of Children: N/A  . Years of Education: N/A   Occupational History  . Not on file.   Social History Main Topics  . Smoking status: Never Smoker   . Smokeless tobacco: Never Used  . Alcohol Use: No  . Drug Use: No  . Sexual Activity: No   Other Topics Concern  . Not on file   Social History Narrative  . No narrative on file    FAMILY HISTORY: Family History  Problem Relation Age of Onset  . Hypertension Other   . Prostate cancer Father   . Colitis Neg Hx   . Esophageal cancer Neg Hx   . Stomach cancer Neg Hx     ALLERGIES:  is allergic to amiodarone; amoxicillin; aspirin; atenolol; and nortriptyline.  MEDICATIONS:  Current Outpatient Prescriptions  Medication Sig Dispense Refill  . amLODipine (NORVASC) 5 MG tablet Take 1 tablet (5 mg total) by mouth daily.  90 tablet  3  . cholecalciferol (VITAMIN D) 1000 UNITS tablet Take 1 tablet (1,000 Units total) by mouth daily.  100 tablet  3  . dicyclomine (BENTYL) 10 MG capsule TAKE 1 CAPSULE (10 MG TOTAL) BY MOUTH 2 (TWO) TIMES DAILY.  AS NEEDED      . hydrochlorothiazide (HYDRODIURIL) 25 MG tablet Take 1 tablet (25 mg total) by mouth daily.  90 tablet  3  . hydrocortisone (PROCTOSOL HC) 2.5 % rectal cream APPLY FOR 7 SEVEN DAYS AT BEDTIME  30 g  0  . LORazepam (ATIVAN) 1 MG tablet 0.5 mg 2 (two) times daily as needed. TAKE 1 TABLET TWICE A DAY AS NEEDED FOR ANXIETY      . OVER THE COUNTER MEDICATION Take 2 capsules by mouth daily. Potassium supplement       No current facility-administered medications for this visit.    REVIEW OF SYSTEMS:   Constitutional: Denies fevers, chills or abnormal night sweats Eyes: Denies blurriness of vision, double vision or watery eyes Ears, nose, mouth, throat, and face: Denies mucositis or sore throat Respiratory: Denies cough, dyspnea or  wheezes Cardiovascular: Denies palpitation, chest discomfort or lower extremity swelling Gastrointestinal:  Denies nausea, heartburn or change in bowel habits Skin: Denies abnormal skin rashes Lymphatics: Denies new lymphadenopathy or easy bruising Neurological:Denies numbness, tingling or new weaknesses Behavioral/Psych: Mood is stable, no new changes  All other systems were reviewed with the patient and are negative.  PHYSICAL EXAMINATION: ECOG PERFORMANCE STATUS: 1 - Symptomatic but completely ambulatory  Filed Vitals:   02/17/14 1500  BP: 154/59  Pulse: 87  Temp: 97.1 F (36.2 C)  Resp: 19   Filed Weights   02/17/14 1500  Weight: 110 lb 8 oz (50.122 kg)    GENERAL:alert, no distress and comfortable. She looks thin but not cachectic SKIN: skin color, texture, turgor are normal, no rashes or significant lesions EYES: normal, conjunctiva are pink and non-injected, sclera clear OROPHARYNX:no exudate, normal lips, buccal mucosa, and tongue  NECK: supple, thyroid normal size, non-tender, without nodularity LYMPH:  no palpable lymphadenopathy in the cervical, axillary or inguinal LUNGS: clear to auscultation and percussion with normal breathing effort HEART: regular rate & rhythm and no murmurs without lower extremity edema ABDOMEN:abdomen soft, non-tender and normal bowel sounds Musculoskeletal:no cyanosis of digits and no clubbing  PSYCH: alert & oriented x 3 with fluent speech. She has very poor hearing NEURO: no focal motor/sensory deficits  LABORATORY DATA:  I have reviewed the data as listed Lab Results  Component Value Date   WBC 3.4* 02/17/2014   HGB 11.7 02/17/2014   HCT 36.7 02/17/2014   MCV 97.9 02/17/2014   PLT 204 02/17/2014    Recent Labs  02/25/13 1522 03/11/13 1503  12/02/13 1508 01/20/14 1553 02/17/14 1453  NA 141 143  < > 147* 141 143  K 3.6 3.5  < > 3.2* 3.2* 3.4*  CL 104 103  --   --  97  --   CO2 29 29  < > 30* 25 28  GLUCOSE 89 91  < > 93 184*  115  BUN 12.6 13.6  < > 14.6 16 16.0  CREATININE 0.8 0.8  < > 0.8 0.71 0.9  CALCIUM 9.9 9.8  < > 10.2 10.3 10.1  PROT 7.4 7.9  < > 8.5* 8.0 8.3  ALBUMIN 3.6 3.7  < > 3.9 3.7 3.7  AST 16 13  < > _0 ALT 10 12  < > _1 ALKPHOS 73 65  < > 61 60 55  BILITOT 1.01 1.06  < > 0.89 0.7 0.97  < > = values in this interval not displayed.  ASSESSMENT & PLAN:  #1 biclonal multiple myeloma Serum protein electrophoresis and free light chains document slight progression over the last 5 months but overall no major changes. She has no signs of anemia, renal failure, hypercalcemia on  new bone lesions. Recommend observation with repeat history, physical examination and blood in 3 months. #2 Mild leukopenia This is likely due to recent treatment. The patient denies recent history of fevers, cough, chills, diarrhea or dysuria. She is asymptomatic from the leukopenia. I will observe for now.   #3 Supportive care She will continue calcium with vitamin D. She was not given IV bisphosphonate.  Orders Placed This Encounter  Procedures  . CBC with Differential    Standing Status: Future     Number of Occurrences:      Standing Expiration Date: 02/17/2015  . Comprehensive metabolic panel    Standing Status: Future     Number of Occurrences:      Standing Expiration Date: 02/17/2015  . SPEP & IFE with QIG    Standing Status: Future     Number of Occurrences:      Standing Expiration Date: 02/17/2015  . Kappa/lambda light chains    Standing Status: Future     Number of Occurrences:      Standing Expiration Date: 02/17/2015    All questions were answered. The patient knows to call the clinic with any problems, questions or concerns. I spent 15 minutes counseling the patient face to face. The total time spent in the appointment was 20 minutes and more than 50% was on counseling.     Heath Lark, MD 02/17/2014 8:17 PM

## 2014-02-22 ENCOUNTER — Telehealth: Payer: Self-pay | Admitting: Hematology and Oncology

## 2014-02-22 LAB — IMMUNOFIXATION ELECTROPHORESIS
IGM, SERUM: 18 mg/dL — AB (ref 52–322)
IgA: 251 mg/dL (ref 69–380)
IgG (Immunoglobin G), Serum: 1920 mg/dL — ABNORMAL HIGH (ref 690–1700)
TOTAL PROTEIN, SERUM ELECTROPHOR: 8 g/dL (ref 6.0–8.3)

## 2014-02-22 LAB — KAPPA/LAMBDA LIGHT CHAINS
KAPPA FREE LGHT CHN: 2.05 mg/dL — AB (ref 0.33–1.94)
KAPPA LAMBDA RATIO: 0.04 — AB (ref 0.26–1.65)
Lambda Free Lght Chn: 55.4 mg/dL — ABNORMAL HIGH (ref 0.57–2.63)

## 2014-02-22 NOTE — Telephone Encounter (Signed)
, °

## 2014-03-10 ENCOUNTER — Other Ambulatory Visit: Payer: Medicare Other

## 2014-05-19 ENCOUNTER — Other Ambulatory Visit: Payer: Medicare Other

## 2014-05-19 ENCOUNTER — Ambulatory Visit: Payer: Medicare Other | Admitting: Hematology and Oncology

## 2014-06-14 ENCOUNTER — Telehealth: Payer: Self-pay | Admitting: *Deleted

## 2014-06-14 NOTE — Telephone Encounter (Signed)
Left msg on triage stating pharmacy sent request for mom ativan to be refilled haven't received call back. Requesting refill...Johny Chess

## 2014-06-15 ENCOUNTER — Other Ambulatory Visit: Payer: Self-pay | Admitting: Internal Medicine

## 2014-06-15 MED ORDER — LORAZEPAM 1 MG PO TABS: 1.0000 mg | ORAL_TABLET | Freq: Two times a day (BID) | ORAL | Status: DC

## 2014-06-15 NOTE — Telephone Encounter (Signed)
Called [facility] spoke with Sharon/pharmacist gave md approval. Notified family refill has been [facility].../lmb

## 2014-06-15 NOTE — Telephone Encounter (Signed)
OK to fill this prescription with additional refills x2 Thank you!  

## 2014-06-16 ENCOUNTER — Telehealth: Payer: Self-pay | Admitting: Hematology and Oncology

## 2014-06-16 NOTE — Telephone Encounter (Signed)
Pt's daughter called to r/s labs/ov r/s updated apt and mailed out

## 2014-06-19 NOTE — Telephone Encounter (Signed)
Lorazepam has been called to pharmacy voicemail.

## 2014-06-23 ENCOUNTER — Ambulatory Visit (INDEPENDENT_AMBULATORY_CARE_PROVIDER_SITE_OTHER): Payer: Medicare Other | Admitting: Internal Medicine

## 2014-06-23 ENCOUNTER — Encounter: Payer: Self-pay | Admitting: Internal Medicine

## 2014-06-23 VITALS — BP 158/78 | HR 84 | Temp 98.2°F | Wt 118.0 lb

## 2014-06-23 DIAGNOSIS — C9000 Multiple myeloma not having achieved remission: Secondary | ICD-10-CM

## 2014-06-23 DIAGNOSIS — F411 Generalized anxiety disorder: Secondary | ICD-10-CM

## 2014-06-23 DIAGNOSIS — D638 Anemia in other chronic diseases classified elsewhere: Secondary | ICD-10-CM

## 2014-06-23 DIAGNOSIS — I10 Essential (primary) hypertension: Secondary | ICD-10-CM

## 2014-06-23 DIAGNOSIS — R197 Diarrhea, unspecified: Secondary | ICD-10-CM

## 2014-06-23 MED ORDER — HYDROCORTISONE 2.5 % RE CREA: TOPICAL_CREAM | RECTAL | Status: DC

## 2014-06-23 NOTE — Patient Instructions (Signed)
You can try Amlodipine 1/2 tablet a day to see if the swelling is better  Wt Readings from Last 3 Encounters:  06/23/14 118 lb (53.524 kg)  02/17/14 110 lb 8 oz (50.122 kg)  02/02/14 108 lb 6.4 oz (49.17 kg)

## 2014-06-23 NOTE — Progress Notes (Signed)
   Subjective:    HPI   The patient presents for a follow-up of  chronic hypertension, chronic dyslipidemia, post-op issues, controlled with medicines  Wt Readings from Last 3 Encounters:  06/23/14 118 lb (53.524 kg)  02/17/14 110 lb 8 oz (50.122 kg)  02/02/14 108 lb 6.4 oz (49.17 kg)    BP Readings from Last 3 Encounters:  06/23/14 158/78  02/17/14 154/59  02/02/14 130/84      Review of Systems  Constitutional: Negative for chills, activity change, appetite change, fatigue and unexpected weight change.  HENT: Negative for congestion, mouth sores and sinus pressure.   Eyes: Negative for visual disturbance.  Respiratory: Negative for cough and chest tightness.   Gastrointestinal: Negative for nausea and abdominal pain.  Genitourinary: Negative for frequency, difficulty urinating and vaginal pain.  Musculoskeletal: Negative for back pain and gait problem.  Skin: Negative for pallor and rash.  Neurological: Negative for dizziness, tremors, weakness, numbness and headaches.  Psychiatric/Behavioral: Negative for confusion and sleep disturbance. The patient is nervous/anxious.        Objective:   Physical Exam  Constitutional: She appears well-developed. No distress.  thin  HENT:  Head: Normocephalic.  Right Ear: External ear normal.  Left Ear: External ear normal.  Nose: Nose normal.  Mouth/Throat: Oropharynx is clear and moist.  Eyes: Conjunctivae are normal. Pupils are equal, round, and reactive to light. Right eye exhibits no discharge. Left eye exhibits no discharge.  Neck: Normal range of motion. Neck supple. No JVD present. No tracheal deviation present. No thyromegaly present.  Cardiovascular: Normal rate, regular rhythm and normal heart sounds.   Pulmonary/Chest: No stridor. No respiratory distress. She has no wheezes.  Abdominal: Soft. Bowel sounds are normal. She exhibits no distension and no mass. There is no tenderness. There is no rebound and no guarding.   Musculoskeletal: She exhibits no edema and no tenderness.  Lymphadenopathy:    She has no cervical adenopathy.  Neurological: She displays normal reflexes. No cranial nerve deficit. She exhibits normal muscle tone. Coordination normal.  Skin: No rash noted. No erythema.  Psychiatric: She has a normal mood and affect. Her behavior is normal. Judgment and thought content normal.   Lab Results  Component Value Date   WBC 3.4* 02/17/2014   HGB 11.7 02/17/2014   HCT 36.7 02/17/2014   PLT 204 02/17/2014   GLUCOSE 115 02/17/2014   CHOL 158 03/09/2007   TRIG 37 03/09/2007   HDL 58.8 03/09/2007   LDLCALC 92 03/09/2007   ALT 20 02/17/2014   AST 22 02/17/2014   NA 143 02/17/2014   K 3.4* 02/17/2014   CL 97 01/20/2014   CREATININE 0.9 02/17/2014   BUN 16.0 02/17/2014   CO2 28 02/17/2014   TSH 1.127 02/21/2011   INR 1.19 02/21/2011   HGBA1C 5.3 03/09/2007          Assessment & Plan:

## 2014-06-23 NOTE — Assessment & Plan Note (Signed)
Continue with current prescription therapy as reflected on the Med list.  

## 2014-06-23 NOTE — Assessment & Plan Note (Signed)
Long term - chronic Dr Beryle Beams

## 2014-06-23 NOTE — Progress Notes (Signed)
Pre visit review using our clinic review tool, if applicable. No additional management support is needed unless otherwise documented below in the visit note. 

## 2014-06-23 NOTE — Assessment & Plan Note (Signed)
Chronic Dr Olevia Perches

## 2014-07-11 ENCOUNTER — Other Ambulatory Visit (HOSPITAL_BASED_OUTPATIENT_CLINIC_OR_DEPARTMENT_OTHER): Payer: Medicare Other

## 2014-07-11 ENCOUNTER — Ambulatory Visit (HOSPITAL_BASED_OUTPATIENT_CLINIC_OR_DEPARTMENT_OTHER): Payer: Medicare Other | Admitting: Hematology and Oncology

## 2014-07-11 ENCOUNTER — Telehealth: Payer: Self-pay | Admitting: Hematology and Oncology

## 2014-07-11 ENCOUNTER — Encounter: Payer: Self-pay | Admitting: Hematology and Oncology

## 2014-07-11 VITALS — BP 167/68 | HR 102 | Temp 98.6°F | Resp 18 | Ht 62.0 in | Wt 116.4 lb

## 2014-07-11 DIAGNOSIS — C9000 Multiple myeloma not having achieved remission: Secondary | ICD-10-CM

## 2014-07-11 DIAGNOSIS — I10 Essential (primary) hypertension: Secondary | ICD-10-CM

## 2014-07-11 DIAGNOSIS — D72819 Decreased white blood cell count, unspecified: Secondary | ICD-10-CM

## 2014-07-11 LAB — COMPREHENSIVE METABOLIC PANEL (CC13)
ALT: 22 U/L (ref 0–55)
AST: 24 U/L (ref 5–34)
Albumin: 3.6 g/dL (ref 3.5–5.0)
Alkaline Phosphatase: 70 U/L (ref 40–150)
Anion Gap: 11 mEq/L (ref 3–11)
BUN: 16.3 mg/dL (ref 7.0–26.0)
CALCIUM: 10.7 mg/dL — AB (ref 8.4–10.4)
CHLORIDE: 107 meq/L (ref 98–109)
CO2: 26 mEq/L (ref 22–29)
Creatinine: 1 mg/dL (ref 0.6–1.1)
GLUCOSE: 120 mg/dL (ref 70–140)
POTASSIUM: 3.9 meq/L (ref 3.5–5.1)
SODIUM: 144 meq/L (ref 136–145)
TOTAL PROTEIN: 9.2 g/dL — AB (ref 6.4–8.3)
Total Bilirubin: 1.11 mg/dL (ref 0.20–1.20)

## 2014-07-11 LAB — CBC WITH DIFFERENTIAL/PLATELET
BASO%: 0.9 % (ref 0.0–2.0)
Basophils Absolute: 0 10*3/uL (ref 0.0–0.1)
EOS%: 0.5 % (ref 0.0–7.0)
Eosinophils Absolute: 0 10*3/uL (ref 0.0–0.5)
HEMATOCRIT: 36.7 % (ref 34.8–46.6)
HEMOGLOBIN: 11.7 g/dL (ref 11.6–15.9)
LYMPH#: 0.5 10*3/uL — AB (ref 0.9–3.3)
LYMPH%: 14.4 % (ref 14.0–49.7)
MCH: 30 pg (ref 25.1–34.0)
MCHC: 31.9 g/dL (ref 31.5–36.0)
MCV: 94 fL (ref 79.5–101.0)
MONO#: 0.2 10*3/uL (ref 0.1–0.9)
MONO%: 7 % (ref 0.0–14.0)
NEUT#: 2.5 10*3/uL (ref 1.5–6.5)
NEUT%: 77.2 % — AB (ref 38.4–76.8)
PLATELETS: 217 10*3/uL (ref 145–400)
RBC: 3.9 10*6/uL (ref 3.70–5.45)
RDW: 14.9 % — ABNORMAL HIGH (ref 11.2–14.5)
WBC: 3.3 10*3/uL — AB (ref 3.9–10.3)

## 2014-07-11 NOTE — Assessment & Plan Note (Signed)
The patient thought that this is related to whitecoat hypertension. I recommended close followup with PCP

## 2014-07-11 NOTE — Progress Notes (Signed)
[place] OFFICE PROGRESS NOTE  Patient Care Team: Cassandria Anger, MD as PCP - General [place] Dragon, MD as Consulting Physician (Gastroenterology) Leonie Man, MD as Consulting Physician (Cardiology) Heath Lark, MD as Consulting Physician (Hematology and Oncology)  SUMMARY OF ONCOLOGIC HISTORY: She was initially diagnosed in October, 2008. Initial treatment with oral Alkeran, prednisone, and Revlimid. Revlimid stopped early on due to a dysphoric reaction. She was treated for 6 months. She reached a stable plateau and had a long treatment free interval. Last cycle given in October 2009. Secondary to rising paraprotein levels, treatment was reinitiated with oral pomalidomide in February 2014. Initial dose 2 mg 21 days on 7 day rest. She tolerated this well and was escalated to 3 mg. There was a slow but steady improvement in her paraprotein levels. Due to various reasons, pomalidomide was held since 09/02/2013.   INTERVAL HISTORY: Please see below for problem oriented charting. The patient feels well. Denies new bone pain or fracture. Denies recent infection.  REVIEW OF SYSTEMS:   Constitutional: Denies fevers, chills or abnormal weight loss Eyes: Denies blurriness of vision Ears, nose, mouth, throat, and face: Denies mucositis or sore throat Respiratory: Denies cough, dyspnea or wheezes Cardiovascular: Denies palpitation, chest discomfort or lower extremity swelling Gastrointestinal:  Denies nausea, heartburn or change in bowel habits Skin: Denies abnormal skin rashes Lymphatics: Denies new lymphadenopathy or easy bruising Neurological:Denies numbness, tingling or new weaknesses Behavioral/Psych: Mood is stable, no new changes  All other systems were reviewed with the patient and are negative.  I have reviewed the past medical history, past surgical history, social history and family history with the patient and they are unchanged from previous  note.  ALLERGIES:  is allergic to amiodarone; amoxicillin; aspirin; atenolol; and nortriptyline.  MEDICATIONS:  Current Outpatient Prescriptions  Medication Sig Dispense Refill  . amLODipine (NORVASC) 5 MG tablet Take 1 tablet (5 mg total) by mouth daily.  90 tablet  3  . cholecalciferol (VITAMIN D) 1000 UNITS tablet Take 1 tablet (1,000 Units total) by mouth daily.  100 tablet  3  . dicyclomine (BENTYL) 10 MG capsule TAKE 1 CAPSULE (10 MG TOTAL) BY MOUTH 2 (TWO) TIMES DAILY.  AS NEEDED      . hydrochlorothiazide (HYDRODIURIL) 25 MG tablet Take 1 tablet (25 mg total) by mouth daily.  90 tablet  3  . hydrocortisone (PROCTOSOL HC) 2.5 % rectal cream APPLY FOR 7 SEVEN DAYS AT BEDTIME  30 g  0  . LORazepam (ATIVAN) 1 MG tablet Take 1 tablet (1 mg total) by mouth 2 (two) times daily.  60 tablet  2  . OVER THE COUNTER MEDICATION Take 2 capsules by mouth daily. Potassium supplement       No current facility-administered medications for this visit.    PHYSICAL EXAMINATION: ECOG PERFORMANCE STATUS: 1 - Symptomatic but completely ambulatory  Filed Vitals:   07/11/14 1516  BP: 167/68  Pulse: 102  Temp: 98.6 F (37 C)  Resp: 18   Filed Weights   07/11/14 1516  Weight: 116 lb 6.4 oz (52.799 kg)    GENERAL:alert, no distress and comfortable SKIN: skin color, texture, turgor are normal, no rashes or significant lesions EYES: normal, Conjunctiva are pink and non-injected, sclera clear OROPHARYNX:no exudate, no erythema and lips, buccal mucosa, and tongue normal  NECK: supple, thyroid normal size, non-tender, without nodularity LYMPH:  no palpable lymphadenopathy in the cervical, axillary or inguinal LUNGS: clear to auscultation and percussion with normal breathing  effort HEART: regular rate & rhythm and no murmurs with mild bilateral lower extremity edema ABDOMEN:abdomen soft, non-tender and normal bowel sounds Musculoskeletal:no cyanosis of digits and no clubbing  NEURO: alert & oriented  x 3 with fluent speech, no focal motor/sensory deficits  LABORATORY DATA:  I have reviewed the data as listed    Component Value Date/Time   NA 144 07/11/2014 1459   NA 141 01/20/2014 1553   K 3.9 07/11/2014 1459   K 3.2* 01/20/2014 1553   CL 97 01/20/2014 1553   CL 103 03/11/2013 1503   CO2 26 07/11/2014 1459   CO2 25 01/20/2014 1553   GLUCOSE 120 07/11/2014 1459   GLUCOSE 184* 01/20/2014 1553   GLUCOSE 91 03/11/2013 1503   BUN 16.3 07/11/2014 1459   BUN 16 01/20/2014 1553   CREATININE 1.0 07/11/2014 1459   CREATININE 0.71 01/20/2014 1553   CALCIUM 10.7* 07/11/2014 1459   CALCIUM 10.3 01/20/2014 1553   PROT 9.2* 07/11/2014 1459   PROT 8.0 01/20/2014 1553   ALBUMIN 3.6 07/11/2014 1459   ALBUMIN 3.7 01/20/2014 1553   AST 24 07/11/2014 1459   AST 22 01/20/2014 1553   ALT 22 07/11/2014 1459   ALT 21 01/20/2014 1553   ALKPHOS 70 07/11/2014 1459   ALKPHOS 60 01/20/2014 1553   BILITOT 1.11 07/11/2014 1459   BILITOT 0.7 01/20/2014 1553   GFRNONAA 81* 06/30/2011 0350   GFRAA >90 06/30/2011 0350    No results found for this basename: SPEP,  UPEP,   kappa and lambda light chains    Lab Results  Component Value Date   WBC 3.3* 07/11/2014   NEUTROABS 2.5 07/11/2014   HGB 11.7 07/11/2014   HCT 36.7 07/11/2014   MCV 94.0 07/11/2014   PLT 217 07/11/2014      Chemistry      Component Value Date/Time   NA 144 07/11/2014 1459   NA 141 01/20/2014 1553   K 3.9 07/11/2014 1459   K 3.2* 01/20/2014 1553   CL 97 01/20/2014 1553   CL 103 03/11/2013 1503   CO2 26 07/11/2014 1459   CO2 25 01/20/2014 1553   BUN 16.3 07/11/2014 1459   BUN 16 01/20/2014 1553   CREATININE 1.0 07/11/2014 1459   CREATININE 0.71 01/20/2014 1553      Component Value Date/Time   CALCIUM 10.7* 07/11/2014 1459   CALCIUM 10.3 01/20/2014 1553   ALKPHOS 70 07/11/2014 1459   ALKPHOS 60 01/20/2014 1553   AST 24 07/11/2014 1459   AST 22 01/20/2014 1553   ALT 22 07/11/2014 1459   ALT 21 01/20/2014 1553   BILITOT 1.11 07/11/2014  1459   BILITOT 0.7 01/20/2014 1553     ASSESSMENT & PLAN:  Multiple myeloma Clinically, she has no signs of disease progression. I plan to see her 1 more time in 8 months next year and if her disease remained stable, I will space out her future followup appointment to yearly visit.  Leukopenia This is unknown. The patient denies recent history of fevers, cough, chills, diarrhea or dysuria. She is asymptomatic from the leukopenia. I will observe for now.   Hypercalcemia This is mild and she is not symptomatic; I suspect this could be related to her vitamin D, calcium supplement and hydrochlorothiazide. Full myeloma panel is not back. If she has no evidence of disease or progression, I will recommend observation only and perhaps adjusting her vitamin D and calcium supplement   Essential hypertension The patient thought that this is  related to whitecoat hypertension. I recommended close followup with PCP  We discussed the importance of preventive care and reviewed the vaccination programs. She does not have any prior allergic reactions to influenza vaccination. She declined to proceed with influenza vaccination today at the clinic.   Orders Placed This Encounter  Procedures  . DG Bone Survey Met    Standing Status: Future     Number of Occurrences:      Standing Expiration Date: 09/10/2015    Order Specific Question:  Reason for Exam (SYMPTOM  OR DIAGNOSIS REQUIRED)    Answer:  staging myeloma    Order Specific Question:  Preferred imaging location?    Answer:  [facility]  . CBC with Differential    Standing Status: Future     Number of Occurrences:      Standing Expiration Date: 09/10/2015  . Comprehensive metabolic panel    Standing Status: Future     Number of Occurrences:      Standing Expiration Date: 09/10/2015  . Lactate dehydrogenase    Standing Status: Future     Number of Occurrences:      Standing Expiration Date: 09/10/2015  . SPEP & IFE with QIG     Standing Status: Future     Number of Occurrences:      Standing Expiration Date: 09/10/2015  . Kappa/lambda light chains    Standing Status: Future     Number of Occurrences:      Standing Expiration Date: 09/10/2015  . Beta 2 microglobulin, serum    Standing Status: Future     Number of Occurrences:      Standing Expiration Date: 09/10/2015   All questions were answered. The patient knows to call the clinic with any problems, questions or concerns. No barriers to learning was detected. I spent 25 minutes counseling the patient face to face. The total time spent in the appointment was 30 minutes and more than 50% was on counseling and review of test results     [facility], [place], MD 07/11/2014 7:26 PM

## 2014-07-11 NOTE — Telephone Encounter (Signed)
Gave avs & apt d/t per POF 07/11/14.-Amber

## 2014-07-11 NOTE — Assessment & Plan Note (Signed)
This is mild and she is not symptomatic; I suspect this could be related to her vitamin D, calcium supplement and hydrochlorothiazide. Full myeloma panel is not back. If she has no evidence of disease or progression, I will recommend observation only and perhaps adjusting her vitamin D and calcium supplement

## 2014-07-11 NOTE — Assessment & Plan Note (Signed)
This is unknown. The patient denies recent history of fevers, cough, chills, diarrhea or dysuria. She is asymptomatic from the leukopenia. I will observe for now.

## 2014-07-11 NOTE — Assessment & Plan Note (Signed)
Clinically, she has no signs of disease progression. I plan to see her 1 more time in 8 months next year and if her disease remained stable, I will space out her future followup appointment to yearly visit.

## 2014-07-13 ENCOUNTER — Telehealth: Payer: Self-pay | Admitting: *Deleted

## 2014-07-13 LAB — SPEP & IFE WITH QIG
ALBUMIN ELP: 47.1 % — AB (ref 55.8–66.1)
ALPHA-2-GLOBULIN: 10.7 % (ref 7.1–11.8)
Alpha-1-Globulin: 5.6 % — ABNORMAL HIGH (ref 2.9–4.9)
BETA 2: 5.8 % (ref 3.2–6.5)
BETA GLOBULIN: 5.3 % (ref 4.7–7.2)
Gamma Globulin: 25.5 % — ABNORMAL HIGH (ref 11.1–18.8)
IGA: 261 mg/dL (ref 69–380)
IgG (Immunoglobin G), Serum: 2100 mg/dL — ABNORMAL HIGH (ref 690–1700)
IgM, Serum: 15 mg/dL — ABNORMAL LOW (ref 52–322)
M-Spike, %: 0.15 g/dL
Total Protein, Serum Electrophoresis: 8.7 g/dL — ABNORMAL HIGH (ref 6.0–8.3)

## 2014-07-13 LAB — KAPPA/LAMBDA LIGHT CHAINS
Kappa free light chain: 1.63 mg/dL (ref 0.33–1.94)
Kappa:Lambda Ratio: 0.02 — ABNORMAL LOW (ref 0.26–1.65)
LAMBDA FREE LGHT CHN: 76.7 mg/dL — AB (ref 0.57–2.63)

## 2014-07-13 NOTE — Telephone Encounter (Signed)
Message copied by Cathlean Cower on Thu Jul 13, 2014  5:51 PM ------      Message from: [facility], [place]: Thu Jul 13, 2014  2:45 PM      Regarding: mildly elevated calcium       Please call daughter and inform her the mildly elevated calcium. Could be related to HCTZ of her supplements. Recommend hold calcium      ----- Message -----         From: [facility]         Sent: 07/11/2014   3:15 PM           To: Heath Lark, MD                   ------

## 2014-07-13 NOTE — Telephone Encounter (Signed)
Yes

## 2014-07-13 NOTE — Telephone Encounter (Signed)
Instructed daughter of calcium level a little high and to hold any calcium supplements and HCTZ.  Daughter says pt "not really taking" the HCTZ right now so will continue to hold and she doesn't think she is taking any calcium supplements.  She will make sure.  Daughter asks if nurse can mail copies of pt's labs to her?

## 2014-07-14 ENCOUNTER — Other Ambulatory Visit: Payer: Self-pay | Admitting: Internal Medicine

## 2014-07-14 NOTE — Telephone Encounter (Signed)
Recent labs placed in mail to Patient.

## 2014-08-22 ENCOUNTER — Other Ambulatory Visit: Payer: Self-pay | Admitting: Internal Medicine

## 2014-10-02 ENCOUNTER — Ambulatory Visit (INDEPENDENT_AMBULATORY_CARE_PROVIDER_SITE_OTHER): Payer: Medicare Other | Admitting: Internal Medicine

## 2014-10-02 ENCOUNTER — Encounter: Payer: Self-pay | Admitting: Internal Medicine

## 2014-10-02 ENCOUNTER — Other Ambulatory Visit (INDEPENDENT_AMBULATORY_CARE_PROVIDER_SITE_OTHER): Payer: Medicare Other

## 2014-10-02 VITALS — BP 140/70 | HR 95 | Temp 98.5°F | Wt 115.0 lb

## 2014-10-02 DIAGNOSIS — F0391 Unspecified dementia with behavioral disturbance: Secondary | ICD-10-CM

## 2014-10-02 DIAGNOSIS — R202 Paresthesia of skin: Secondary | ICD-10-CM

## 2014-10-02 DIAGNOSIS — F03918 Unspecified dementia, unspecified severity, with other behavioral disturbance: Secondary | ICD-10-CM

## 2014-10-02 DIAGNOSIS — I10 Essential (primary) hypertension: Secondary | ICD-10-CM

## 2014-10-02 DIAGNOSIS — F411 Generalized anxiety disorder: Secondary | ICD-10-CM

## 2014-10-02 LAB — CBC WITH DIFFERENTIAL/PLATELET
Basophils Absolute: 0 10*3/uL (ref 0.0–0.1)
Basophils Relative: 0.3 % (ref 0.0–3.0)
Eosinophils Absolute: 0 10*3/uL (ref 0.0–0.7)
Eosinophils Relative: 0.5 % (ref 0.0–5.0)
HCT: 36.3 % (ref 36.0–46.0)
HEMOGLOBIN: 11.5 g/dL — AB (ref 12.0–15.0)
LYMPHS PCT: 15.3 % (ref 12.0–46.0)
Lymphs Abs: 0.6 10*3/uL — ABNORMAL LOW (ref 0.7–4.0)
MCHC: 31.7 g/dL (ref 30.0–36.0)
MCV: 94.8 fl (ref 78.0–100.0)
MONOS PCT: 4.4 % (ref 3.0–12.0)
Monocytes Absolute: 0.2 10*3/uL (ref 0.1–1.0)
Neutro Abs: 2.9 10*3/uL (ref 1.4–7.7)
Neutrophils Relative %: 79.5 % — ABNORMAL HIGH (ref 43.0–77.0)
Platelets: 272 10*3/uL (ref 150.0–400.0)
RBC: 3.83 Mil/uL — ABNORMAL LOW (ref 3.87–5.11)
RDW: 15.1 % (ref 11.5–15.5)
WBC: 3.6 10*3/uL — ABNORMAL LOW (ref 4.0–10.5)

## 2014-10-02 LAB — TSH: TSH: 1.08 u[IU]/mL (ref 0.35–4.50)

## 2014-10-02 MED ORDER — AMLODIPINE BESYLATE 5 MG PO TABS: ORAL_TABLET | ORAL | Status: DC

## 2014-10-02 MED ORDER — LORAZEPAM 1 MG PO TABS: 1.0000 mg | ORAL_TABLET | Freq: Two times a day (BID) | ORAL | Status: DC

## 2014-10-02 MED ORDER — DICYCLOMINE HCL 10 MG PO CAPS: 10.0000 mg | ORAL_CAPSULE | Freq: Three times a day (TID) | ORAL | Status: DC

## 2014-10-02 MED ORDER — DONEPEZIL HCL 5 MG PO TABS: 5.0000 mg | ORAL_TABLET | Freq: Every day | ORAL | Status: DC

## 2014-10-02 NOTE — Progress Notes (Signed)
Pre visit review using our clinic review tool, if applicable. No additional management support is needed unless otherwise documented below in the visit note. 

## 2014-10-02 NOTE — Patient Instructions (Signed)
[place] [facility]

## 2014-10-02 NOTE — Progress Notes (Signed)
   Subjective:    HPI   The patient presents for a follow-up of  chronic hypertension, chronic dyslipidemia, post-op issues, controlled with medicines  C/o memory issues - chronic - worse in the past 2 months. Pt is more dysphoric per dtr  Wt Readings from Last 3 Encounters:  10/02/14 115 lb (52.164 kg)  07/11/14 116 lb 6.4 oz (52.799 kg)  06/23/14 118 lb (53.524 kg)    BP Readings from Last 3 Encounters:  10/02/14 140/70  07/11/14 167/68  06/23/14 158/78      Review of Systems  Constitutional: Negative for chills, activity change, appetite change, fatigue and unexpected weight change.  HENT: Negative for congestion, mouth sores and sinus pressure.   Eyes: Negative for visual disturbance.  Respiratory: Negative for cough and chest tightness.   Gastrointestinal: Negative for nausea and abdominal pain.  Genitourinary: Negative for frequency, difficulty urinating and vaginal pain.  Musculoskeletal: Negative for back pain and gait problem.  Skin: Negative for pallor and rash.  Neurological: Negative for dizziness, tremors, weakness, numbness and headaches.  Psychiatric/Behavioral: Negative for confusion and sleep disturbance. The patient is nervous/anxious.        Objective:   Physical Exam  Constitutional: She appears well-developed. No distress.  HENT:  Head: Normocephalic.  Right Ear: External ear normal.  Left Ear: External ear normal.  Nose: Nose normal.  Mouth/Throat: Oropharynx is clear and moist.  Eyes: Conjunctivae are normal. Pupils are equal, round, and reactive to light. Right eye exhibits no discharge. Left eye exhibits no discharge.  Neck: Normal range of motion. Neck supple. No JVD present. No tracheal deviation present. No thyromegaly present.  Cardiovascular: Normal rate, regular rhythm and normal heart sounds.   Pulmonary/Chest: No stridor. No respiratory distress. She has no wheezes.  Abdominal: Soft. Bowel sounds are normal. She exhibits no  distension and no mass. There is no tenderness. There is no rebound and no guarding.  Musculoskeletal: She exhibits no edema or tenderness.  Lymphadenopathy:    She has no cervical adenopathy.  Neurological: She displays normal reflexes. No cranial nerve deficit. She exhibits normal muscle tone. Coordination normal.  Skin: No rash noted. No erythema.  Psychiatric: She has a normal mood and affect. Her behavior is normal. Judgment and thought content normal.  alert, cooperative  Lab Results  Component Value Date   WBC 3.3* 07/11/2014   HGB 11.7 07/11/2014   HCT 36.7 07/11/2014   PLT 217 07/11/2014   GLUCOSE 120 07/11/2014   CHOL 158 03/09/2007   TRIG 37 03/09/2007   HDL 58.8 03/09/2007   LDLCALC 92 03/09/2007   ALT 22 07/11/2014   AST 24 07/11/2014   NA 144 07/11/2014   K 3.9 07/11/2014   CL 97 01/20/2014   CREATININE 1.0 07/11/2014   BUN 16.3 07/11/2014   CO2 26 07/11/2014   TSH 1.127 02/21/2011   INR 1.19 02/21/2011   HGBA1C 5.3 03/09/2007          Assessment & Plan:

## 2014-10-02 NOTE — Assessment & Plan Note (Addendum)
[place] [facility] consult

## 2014-10-03 ENCOUNTER — Encounter: Payer: Self-pay | Admitting: Internal Medicine

## 2014-10-03 LAB — URINALYSIS
Bilirubin Urine: NEGATIVE
Ketones, ur: NEGATIVE
Leukocytes, UA: NEGATIVE
Nitrite: NEGATIVE
PH: 6.5 (ref 5.0–8.0)
Specific Gravity, Urine: 1.015 (ref 1.000–1.030)
Total Protein, Urine: NEGATIVE
URINE GLUCOSE: NEGATIVE
Urobilinogen, UA: 0.2 (ref 0.0–1.0)

## 2014-10-03 LAB — BASIC METABOLIC PANEL
BUN: 17 mg/dL (ref 6–23)
CO2: 31 meq/L (ref 19–32)
CREATININE: 0.9 mg/dL (ref 0.4–1.2)
Calcium: 10.2 mg/dL (ref 8.4–10.5)
Chloride: 104 mEq/L (ref 96–112)
GFR: 78.34 mL/min (ref >60.00)
Glucose, Bld: 121 mg/dL — ABNORMAL HIGH (ref 70–99)
POTASSIUM: 4.2 meq/L (ref 3.5–5.1)
Sodium: 143 mEq/L (ref 135–145)

## 2014-10-03 LAB — HEPATIC FUNCTION PANEL
ALK PHOS: 78 U/L (ref 39–117)
ALT: 15 U/L (ref 0–35)
AST: 22 U/L (ref 0–37)
Albumin: 3.7 g/dL (ref 3.5–5.2)
BILIRUBIN DIRECT: 0.2 mg/dL (ref 0.0–0.3)
TOTAL PROTEIN: 9.2 g/dL — AB (ref 6.0–8.3)
Total Bilirubin: 0.8 mg/dL (ref 0.2–1.2)

## 2014-10-03 LAB — VITAMIN B12: Vitamin B-12: >1500 pg/mL — ABNORMAL HIGH (ref 211–911)

## 2014-10-03 NOTE — Assessment & Plan Note (Signed)
Continue with current prescription therapy as reflected on the Med list.  

## 2014-10-09 ENCOUNTER — Telehealth: Payer: Self-pay | Admitting: Internal Medicine

## 2014-10-09 NOTE — Telephone Encounter (Signed)
Patient would like a fu call in regards to lab results

## 2014-10-10 NOTE — Telephone Encounter (Signed)
Left mess for patient to call back.  

## 2014-10-11 ENCOUNTER — Inpatient Hospital Stay: Admission: RE | Admit: 2014-10-11 | Payer: Medicare Other | Source: Ambulatory Visit

## 2014-10-12 ENCOUNTER — Ambulatory Visit (INDEPENDENT_AMBULATORY_CARE_PROVIDER_SITE_OTHER)
Admission: RE | Admit: 2014-10-12 | Discharge: 2014-10-12 | Disposition: A | Discharge status: Home / Self Care | Payer: Medicare Other | Source: Ambulatory Visit | Attending: Internal Medicine | Admitting: Internal Medicine

## 2014-10-12 DIAGNOSIS — F0391 Unspecified dementia with behavioral disturbance: Secondary | ICD-10-CM

## 2014-11-01 ENCOUNTER — Telehealth: Payer: Self-pay | Admitting: Hematology and Oncology

## 2014-11-01 NOTE — Telephone Encounter (Signed)
lvm for pt regarding to 6.17 appt moved to 6.20 due to MD on call....mailed pt appt sched and letter

## 2014-11-02 ENCOUNTER — Telehealth: Payer: Self-pay | Admitting: Internal Medicine

## 2014-11-02 NOTE — Telephone Encounter (Signed)
Pt daughter called in about results.  Explain to her stacy called her on the 12th   [phone]

## 2014-11-02 NOTE — Telephone Encounter (Signed)
Dr. Alain Marion sent the below messages on MyChart:  Dear Mrs Ernster,  Your head CT shows atrophy consistent with age. No change in plans.  Sincerely,  Walker Kehr, MD    Dear Mrs Nicki Reaper,  Your labs/tests are stable.  Sincerely,  Walker Kehr, MD   Pt's daughter informed and copies of CT and 10/02/14 labs mailed to pt.

## 2014-11-02 NOTE — Telephone Encounter (Signed)
See phone note dated 10/09/14

## 2014-12-29 ENCOUNTER — Ambulatory Visit: Payer: Medicare Other | Admitting: Internal Medicine

## 2014-12-30 ENCOUNTER — Inpatient Hospital Stay (HOSPITAL_COMMUNITY): Payer: Medicare Other

## 2014-12-30 ENCOUNTER — Encounter (HOSPITAL_COMMUNITY): Payer: Self-pay | Admitting: Emergency Medicine

## 2014-12-30 ENCOUNTER — Emergency Department (HOSPITAL_COMMUNITY): Payer: Medicare Other

## 2014-12-30 ENCOUNTER — Inpatient Hospital Stay (HOSPITAL_COMMUNITY)
Admission: EM | Admit: 2014-12-30 | Discharge: 2014-12-31 | DRG: 641 | Disposition: A | Discharge status: Home / Self Care | Payer: Medicare Other | Attending: Internal Medicine | Admitting: Internal Medicine

## 2014-12-30 DIAGNOSIS — C9001 Multiple myeloma in remission: Secondary | ICD-10-CM | POA: Diagnosis present

## 2014-12-30 DIAGNOSIS — F419 Anxiety disorder, unspecified: Secondary | ICD-10-CM | POA: Diagnosis present

## 2014-12-30 DIAGNOSIS — R270 Ataxia, unspecified: Secondary | ICD-10-CM | POA: Diagnosis present

## 2014-12-30 DIAGNOSIS — Z9221 Personal history of antineoplastic chemotherapy: Secondary | ICD-10-CM | POA: Diagnosis not present

## 2014-12-30 DIAGNOSIS — Z79899 Other long term (current) drug therapy: Secondary | ICD-10-CM

## 2014-12-30 DIAGNOSIS — I10 Essential (primary) hypertension: Secondary | ICD-10-CM | POA: Diagnosis present

## 2014-12-30 DIAGNOSIS — E876 Hypokalemia: Secondary | ICD-10-CM | POA: Diagnosis present

## 2014-12-30 DIAGNOSIS — F0391 Unspecified dementia with behavioral disturbance: Secondary | ICD-10-CM | POA: Diagnosis present

## 2014-12-30 DIAGNOSIS — G4700 Insomnia, unspecified: Secondary | ICD-10-CM | POA: Diagnosis present

## 2014-12-30 DIAGNOSIS — C9002 Multiple myeloma in relapse: Secondary | ICD-10-CM | POA: Diagnosis present

## 2014-12-30 HISTORY — DX: Hemorrhage of anus and rectum: K62.5

## 2014-12-30 HISTORY — DX: Tinnitus, unspecified ear: H93.19

## 2014-12-30 HISTORY — DX: Perforation of intestine (nontraumatic): K63.1

## 2014-12-30 LAB — COMPREHENSIVE METABOLIC PANEL
ALBUMIN: 2.9 g/dL — AB (ref 3.5–5.2)
ALT: 10 U/L (ref 0–35)
AST: 19 U/L (ref 0–37)
Alkaline Phosphatase: 85 U/L (ref 39–117)
Anion gap: 5 (ref 5–15)
BILIRUBIN TOTAL: 0.7 mg/dL (ref 0.3–1.2)
BUN: 18 mg/dL (ref 6–23)
CALCIUM: 11.5 mg/dL — AB (ref 8.4–10.5)
CO2: 29 mmol/L (ref 19–32)
CREATININE: 0.71 mg/dL (ref 0.50–1.10)
Chloride: 108 mmol/L (ref 96–112)
GFR calc Af Amer: >90 mL/min (ref >90)
GFR calc non Af Amer: 79 mL/min — ABNORMAL LOW (ref >90)
Glucose, Bld: 87 mg/dL (ref 70–99)
POTASSIUM: 3.2 mmol/L — AB (ref 3.5–5.1)
Sodium: 142 mmol/L (ref 135–145)
Total Protein: 8.8 g/dL — ABNORMAL HIGH (ref 6.0–8.3)

## 2014-12-30 LAB — CBC WITH DIFFERENTIAL/PLATELET
BASOS PCT: 0 % (ref 0–1)
Basophils Absolute: 0 10*3/uL (ref 0.0–0.1)
Eosinophils Absolute: 0 10*3/uL (ref 0.0–0.7)
Eosinophils Relative: 0 % (ref 0–5)
HEMATOCRIT: 30.2 % — AB (ref 36.0–46.0)
Hemoglobin: 9.5 g/dL — ABNORMAL LOW (ref 12.0–15.0)
LYMPHS ABS: 0.6 10*3/uL — AB (ref 0.7–4.0)
Lymphocytes Relative: 12 % (ref 12–46)
MCH: 30.1 pg (ref 26.0–34.0)
MCHC: 31.5 g/dL (ref 30.0–36.0)
MCV: 95.6 fL (ref 78.0–100.0)
Monocytes Absolute: 0.5 10*3/uL (ref 0.1–1.0)
Monocytes Relative: 10 % (ref 3–12)
NEUTROS ABS: 3.8 10*3/uL (ref 1.7–7.7)
Neutrophils Relative %: 78 % — ABNORMAL HIGH (ref 43–77)
PLATELETS: 230 10*3/uL (ref 150–400)
RBC: 3.16 MIL/uL — AB (ref 3.87–5.11)
RDW: 14.8 % (ref 11.5–15.5)
WBC: 4.9 10*3/uL (ref 4.0–10.5)

## 2014-12-30 LAB — TROPONIN I: Troponin I: 0.03 ng/mL (ref <0.031)

## 2014-12-30 LAB — RETICULOCYTES
RBC.: 2.74 MIL/uL — ABNORMAL LOW (ref 3.87–5.11)
Retic Count, Absolute: 38.4 10*3/uL (ref 19.0–186.0)
Retic Ct Pct: 1.4 % (ref 0.4–3.1)

## 2014-12-30 LAB — URINALYSIS, ROUTINE W REFLEX MICROSCOPIC
BILIRUBIN URINE: NEGATIVE
Glucose, UA: NEGATIVE mg/dL
HGB URINE DIPSTICK: NEGATIVE
KETONES UR: NEGATIVE mg/dL
Leukocytes, UA: NEGATIVE
Nitrite: NEGATIVE
PH: 6 (ref 5.0–8.0)
Protein, ur: NEGATIVE mg/dL
Specific Gravity, Urine: 1.017 (ref 1.005–1.030)
UROBILINOGEN UA: 0.2 mg/dL (ref 0.0–1.0)

## 2014-12-30 LAB — CBG MONITORING, ED: Glucose-Capillary: 76 mg/dL (ref 70–99)

## 2014-12-30 LAB — FOLATE

## 2014-12-30 LAB — TSH: TSH: 0.762 u[IU]/mL (ref 0.350–4.500)

## 2014-12-30 LAB — MAGNESIUM: MAGNESIUM: 1.4 mg/dL — AB (ref 1.5–2.5)

## 2014-12-30 LAB — CALCIUM, IONIZED: CALCIUM ION: 1.63 mmol/L — AB (ref 1.12–1.32)

## 2014-12-30 LAB — VITAMIN B12: VITAMIN B 12: 1514 pg/mL — AB (ref 211–911)

## 2014-12-30 LAB — FERRITIN: FERRITIN: 249 ng/mL (ref 10–291)

## 2014-12-30 LAB — IRON AND TIBC
Iron: 20 ug/dL — ABNORMAL LOW (ref 42–145)
Saturation Ratios: 10 % — ABNORMAL LOW (ref 20–55)
TIBC: 196 ug/dL — ABNORMAL LOW (ref 250–470)
UIBC: 176 ug/dL (ref 125–400)

## 2014-12-30 MED ORDER — POTASSIUM CHLORIDE IN NACL 40-0.9 MEQ/L-% IV SOLN
INTRAVENOUS | Status: DC
  Administered 2014-12-30 – 2014-12-31 (×3): 125 mL/h via INTRAVENOUS
  Filled 2014-12-30 (×6): qty 1000

## 2014-12-30 MED ORDER — POTASSIUM CHLORIDE CRYS ER 20 MEQ PO TBCR
40.0000 meq | EXTENDED_RELEASE_TABLET | Freq: Once | ORAL | Status: AC
  Administered 2014-12-30: 40 meq via ORAL
  Filled 2014-12-30: qty 2

## 2014-12-30 MED ORDER — AMLODIPINE BESYLATE 2.5 MG PO TABS
2.5000 mg | ORAL_TABLET | Freq: Every day | ORAL | Status: DC
  Filled 2014-12-30: qty 1

## 2014-12-30 MED ORDER — ONDANSETRON HCL 4 MG/2ML IJ SOLN: 4.0000 mg | Freq: Four times a day (QID) | INTRAMUSCULAR | Status: DC | PRN

## 2014-12-30 MED ORDER — ENOXAPARIN SODIUM 40 MG/0.4ML ~~LOC~~ SOLN
40.0000 mg | Freq: Every day | SUBCUTANEOUS | Status: DC
  Filled 2014-12-30: qty 0.4

## 2014-12-30 MED ORDER — AMLODIPINE BESYLATE 2.5 MG PO TABS
2.5000 mg | ORAL_TABLET | Freq: Once | ORAL | Status: AC
  Administered 2014-12-30: 2.5 mg via ORAL
  Filled 2014-12-30: qty 1

## 2014-12-30 MED ORDER — POLYETHYLENE GLYCOL 3350 17 G PO PACK: 17.0000 g | PACK | Freq: Every day | ORAL | Status: DC | PRN

## 2014-12-30 MED ORDER — ALUM & MAG HYDROXIDE-SIMETH 200-200-20 MG/5ML PO SUSP: 30.0000 mL | Freq: Four times a day (QID) | ORAL | Status: DC | PRN

## 2014-12-30 MED ORDER — AMLODIPINE BESYLATE 5 MG PO TABS
5.0000 mg | ORAL_TABLET | Freq: Every day | ORAL | Status: DC
  Filled 2014-12-30: qty 1

## 2014-12-30 MED ORDER — DONEPEZIL HCL 5 MG PO TABS
5.0000 mg | ORAL_TABLET | Freq: Every day | ORAL | Status: DC
  Filled 2014-12-30 (×2): qty 1

## 2014-12-30 MED ORDER — ONDANSETRON HCL 4 MG PO TABS: 4.0000 mg | ORAL_TABLET | Freq: Four times a day (QID) | ORAL | Status: DC | PRN

## 2014-12-30 MED ORDER — VITAMIN D3 25 MCG (1000 UNIT) PO TABS
1000.0000 [IU] | ORAL_TABLET | Freq: Every day | ORAL | Status: DC
  Administered 2014-12-30: 1000 [IU] via ORAL
  Filled 2014-12-30 (×2): qty 1

## 2014-12-30 MED ORDER — LORAZEPAM 2 MG/ML IJ SOLN
1.0000 mg | Freq: Once | INTRAMUSCULAR | Status: AC
  Administered 2014-12-30: 1 mg via INTRAVENOUS
  Filled 2014-12-30: qty 1

## 2014-12-30 MED ORDER — ACETAMINOPHEN 650 MG RE SUPP: 650.0000 mg | Freq: Four times a day (QID) | RECTAL | Status: DC | PRN

## 2014-12-30 MED ORDER — ACETAMINOPHEN 325 MG PO TABS: 650.0000 mg | ORAL_TABLET | Freq: Four times a day (QID) | ORAL | Status: DC | PRN

## 2014-12-30 NOTE — H&P (Signed)
History and Physical:    Victoria [name]   CHY:[id number] DOB: 16-Feb-1932 DOA: 12/30/2014  Referring physician: Dr. Jola Schmidt PCP: Walker Kehr, MD   Chief Complaint: Gait disturbance  History of Present Illness:   Victoria [name] is an 81 y.o. female with a PMH of multiple myeloma, dementia and hypertension who presented to the ER with new onset of ataxic gait and near fall. Patient sees Dr. Alvy Bimler for treatment of myeloma, which was originally diagnosed 06/2007. She has received chemotherapy in the past for this, however her disease has remained stable and has not been on any active therapy since 08/2013. She has had mild long-standing hypercalcemia for which she has not needed treatment. She was started on Aricept January 2016 for dementia with behavioral disturbance and memory impairment.  The patient's daughter reports that normally she ambulates without assistance, but at 1:00 AM, she needed assistance when ambulating to the bathroom, and was very weak.  Daughter says that she had to practically carry her back to bed. Patient is currently sedated after receiving Ativan, and cannot provide any additional history. She had a similar episode 18 years ago, and was found to have a low potassium level at that time.  The only other pertinent history is that the patient has had some difficulties with her sleep/wake cycles recently, and stays up most of the night.  ROS:   Constitutional: No fever, no chills;  Appetite normal; No weight loss, no weight gain, + fatigue.  HEENT: No blurry vision, no diplopia, no pharyngitis, no dysphagia CV: No chest pain, no palpitations, no PND, no orthopnea, no edema.  Resp: No SOB, no cough, no pleuritic pain. GI: No nausea, no vomiting, no diarrhea, no melena, no hematochezia, no constipation, no abdominal pain, +chronic loose stools.  GU: No dysuria, no hematuria, no frequency, no urgency. MSK: no myalgias, no arthralgias.  Neuro:  No headache, no focal  neurological deficits, no history of seizures.  Psych: No depression, + anxiety, +insomnia/disturbance of sleep-wake cycle.  Endo: No heat intolerance, no cold intolerance, no polyuria, no polydipsia  Skin: No rashes, no skin lesions.  Heme: No easy bruising.  Travel history: No recent travel.   Past Medical History:   Past Medical History  Diagnosis Date  . Hypertension   . Insomnia   . Anxiety   . Anemia   . Multiple myeloma     In remission  . Diverticulosis 04/2001  . Perforation bowel   . Ringing in ears     Wears hearing aides to drown out   . Rectal bleeding 02/03/2014    Past Surgical History:   Past Surgical History  Procedure Laterality Date  . Sigmoid resection / rectopexy  01/2011    perforation/stoma  . Colonoscopy    . Partial hysterectomy    . Cesarean section    . Colostomy takedown  06/27/11  . Cholecystectomy  2012    laparoscopic    Social History:   History   Social History  . Marital Status: Divorced    Spouse Name: N/A  . Number of Children: 2  . Years of Education: N/A   Occupational History  . Not on file.   Social History Main Topics  . Smoking status: Never Smoker   . Smokeless tobacco: Never Used  . Alcohol Use: No  . Drug Use: No  . Sexual Activity: No   Other Topics Concern  . Not on file   Social History Narrative  Divorced.  Lives with daughter.  Normally ambulates without assistance.    Family history:   Family History  Problem Relation Age of Onset  . Hypertension Other   . Prostate cancer Father   . Colitis Neg Hx   . Esophageal cancer Neg Hx   . Stomach cancer Neg Hx     Allergies   Amiodarone; Amoxicillin; Aspirin; Atenolol; and Nortriptyline  Current Medications:   Prior to Admission medications   Medication Sig Start Date End Date Taking? Authorizing Provider  amLODipine (NORVASC) 5 MG tablet TAKE 1 TABLET (5 MG TOTAL) BY MOUTH DAILY. 10/02/14  Yes Aleksei Plotnikov V, MD  cholecalciferol (VITAMIN D)  1000 UNITS tablet Take 1 tablet (1,000 Units total) by mouth daily. 12/09/13  Yes Aleksei Plotnikov V, MD  donepezil (ARICEPT) 5 MG tablet Take 1 tablet (5 mg total) by mouth at bedtime. 10/02/14  Yes Aleksei Plotnikov V, MD  hydrochlorothiazide (HYDRODIURIL) 25 MG tablet Take 1 tablet (25 mg total) by mouth daily. 06/10/13  Yes Aleksei Plotnikov V, MD  hydrocortisone (PROCTOSOL HC) 2.5 % rectal cream APPLY FOR 7 SEVEN DAYS AT BEDTIME 06/23/14  Yes Aleksei Plotnikov V, MD  LORazepam (ATIVAN) 1 MG tablet Take 1 tablet (1 mg total) by mouth 2 (two) times daily. Patient taking differently: Take 1 mg by mouth every 8 (eight) hours as needed for anxiety.  10/02/14  Yes Aleksei Plotnikov V, MD  OVER THE COUNTER MEDICATION Take 2 capsules by mouth daily. Potassium supplement   Yes Historical Provider, MD  dicyclomine (BENTYL) 10 MG capsule Take 1 capsule (10 mg total) by mouth 4 (four) times daily -  before meals and at bedtime. TAKE 1 CAPSULE (10 MG TOTAL) BY MOUTH 2 (TWO) TIMES DAILY.  AS NEEDED 10/02/14   Cassandria Anger, MD    Physical Exam:   Filed Vitals:   12/30/14 0500 12/30/14 0530 12/30/14 0829 12/30/14 1059  BP: 130/65 131/71  131/62  Pulse: 74 69  65  Temp:   98.7 F (37.1 C) 97.8 F (36.6 C)  TempSrc:    Oral  Resp: _0 Height:      Weight:      SpO2: 96% 96%  96%     Physical Exam: Blood pressure 131/62, pulse 65, temperature 97.8 F (36.6 C), temperature source Oral, resp. rate 16, height _1  (1.549 m), weight 51.71 kg (114 lb), SpO2 96 %. Gen: Sedated, no acute distress, frail appearing. Head: Normocephalic, atraumatic. Eyes: PERRL, EOMI, sclerae nonicteric. Mouth: Oropharynx dry mucous membranes. Neck: Supple, no thyromegaly, no lymphadenopathy, no jugular venous distention. Chest: Lungs diminished but clear. CV: Heart sounds are regular. No murmurs, rubs, or gallops. Abdomen: Soft, nontender, nondistended with normal active bowel sounds. Extremities: Extremities  are without clubbing, edema, or cyanosis. Skin: Warm and dry. Neuro: Sedated, unable to assess. Psych: Mood and affect normal.   Data Review:    Labs: Basic Metabolic Panel:  Recent Labs Lab 12/30/14 0319  NA 142  K 3.2*  CL 108  CO2 29  GLUCOSE 87  BUN 18  CREATININE 0.71  CALCIUM 11.5*   Liver Function Tests:  Recent Labs Lab 12/30/14 0319  AST 19  ALT 10  ALKPHOS 85  BILITOT 0.7  PROT 8.8*  ALBUMIN 2.9*   CBC:  Recent Labs Lab 12/30/14 0319  WBC 4.9  NEUTROABS 3.8  HGB 9.5*  HCT 30.2*  MCV 95.6  PLT 230   Cardiac Enzymes:  Recent Labs Lab 12/30/14  0319  TROPONINI 0.03   CBG:  Recent Labs Lab 12/30/14 0259  GLUCAP 76    Radiographic Studies: Ct Head Wo Contrast  12/30/2014   CLINICAL DATA:  Acute onset of generalized weakness while walking to bedroom. Initial encounter.  EXAM: CT HEAD WITHOUT CONTRAST  TECHNIQUE: Contiguous axial images were obtained from the base of the skull through the vertex without intravenous contrast.  COMPARISON:  CT of the head performed 10/12/2014  FINDINGS: There is no evidence of acute infarction, mass lesion, or intra- or extra-axial hemorrhage on CT.  Prominence of the ventricles and sulci reflects mild to moderate cortical volume loss. Mild cerebellar atrophy is noted. Scattered periventricular and subcortical white matter change likely reflects small vessel ischemic microangiopathy.  The brainstem and fourth ventricle are within normal limits. The basal ganglia are unremarkable in appearance. The cerebral hemispheres demonstrate grossly normal gray-white differentiation. No mass effect or midline shift is seen.  There is no evidence of fracture; calvarial lytic lesions are again noted, reflecting the patient's known multiple myeloma. The visualized portions of the orbits are within normal limits. The paranasal sinuses and mastoid air cells are well-aerated. No significant soft tissue abnormalities are seen.   IMPRESSION: 1. No acute intracranial pathology seen on CT. 2. Mild to moderate cortical volume loss and scattered small vessel ischemic microangiopathy. 3. Calvarial lytic lesions again noted, reflecting the patient's known multiple myeloma.   Electronically Signed   By: Garald Balding M.D.   On: 12/30/2014 07:11   Mr Brain Wo Contrast  12/30/2014   CLINICAL DATA:  Gait disturbance.  Somnolent.  Weak.  EXAM: MRI HEAD WITHOUT CONTRAST  TECHNIQUE: Multiplanar, multiecho pulse sequences of the brain and surrounding structures were obtained without intravenous contrast.  COMPARISON:  Head CT same day  FINDINGS: Diffusion imaging does not show any acute or subacute infarction. Brain shows age related atrophy. No abnormality of the brainstem or cerebellum. The cerebral hemispheres show moderate chronic small-vessel ischemic change affecting the white matter. No cortical or large vessel territory infarction. No sign of brain mass, hemorrhage, hydrocephalus or extra-axial collection. No pituitary mass. No inflammatory sinus disease.  There are multiple bone lesions scattered throughout the calvarium, the skullbase in the upper cervical spine consistent with either metastatic disease or myeloma. The largest single lesion is along the right lateral convexity, measuring 1.8 cm in diameter. Clivus lesion measures 1.3 cm in diameter.  IMPRESSION: No acute brain finding. Age related atrophy and chronic small vessel disease.  Multi focal bone lesions consistent with metastatic disease or myeloma.   Electronically Signed   By: Nelson Chimes M.D.   On: 12/30/2014 10:53    EKG: Independently reviewed. Sinus rhythm at 81 bpm.   Assessment/Plan:   Principal Problem:   Ataxia - CT of the head negative for acute findings. - Question if related to Aricept which was started recently. - Unfortunately, unable to assess neurological status due to her having been given Ativan in the ED. - PT/OT evaluations when awake. - Correct  underlying electrolyte abnormalities. - Check TSH and B12.  Active Problems:   INSOMNIA, PERSISTENT - Likely related to dementia with disturbance in sleep/wake cycle.    Essential hypertension - Continue amlodipine.    Hypercalcemia - Vigorously hydrate. Check PTH and ionized calcium.    Hypokalemia - Supplement. Check magnesium.    Multiple myeloma in remission - May need to consult Dr. Alvy Bimler formally if there is any indication that this is not in remission.  DVT prophylaxis - Lovenox ordered.  Code Status: Full. Family Communication: Gift Rueckert (daughter): [phone] updated at the bedside. Disposition Plan: Home when stable.  Time spent: One hour  [facility] Pager [phone] Cell: [phone]   If 7PM-7AM, please contact night-coverage [email or web address] Password [email or web address] 12/30/2014, 1:01 PM

## 2014-12-30 NOTE — ED Notes (Signed)
Pt is in MRI at this time. MRI tech will call when complete and meet this RN in hallway to take pt to floor so that room in ED may be utilized

## 2014-12-30 NOTE — ED Notes (Signed)
Patient uses a cane to walk but for the last day the patient has been using a walker.

## 2014-12-30 NOTE — ED Provider Notes (Addendum)
CSN: [id number]     Arrival date & time 12/30/14  0225 History   First MD Initiated Contact with Patient 12/30/14 [phone]     Chief Complaint  Patient presents with  . Weakness      HPI Family reports that usually the patient is able to walk with only a cane and without assistance however over the past 24 hours she's required a walker and this evening when the patient was getting up out of bed to go to the bathroom she nearly fell and the daughter had catcher.  EMS was called.  Patient denies lightheadedness.  She denies neck or back pain.  She denies weakness of her arms or legs.  The patient believes the reason she is unstable and unsteady on her feet is because she is "sleepy".  Family reports no new change in her medications.  Family reports is very abnormal for the patient not to be able to walk.  Patient denies injury or trauma.  She has no neck pain or back pain.  No fevers or chills.  She denies chest pain or shortness of breath.  She denies abdominal pain.   Past Medical History  Diagnosis Date  . Hypertension   . Insomnia   . Anxiety   . Hearing loss     bilateral hearing aids  . Anemia   . Multiple myeloma   . Diverticulosis 04/2001  . Perforation bowel    Past Surgical History  Procedure Laterality Date  . Sigmoid resection / rectopexy  01/2011    perforation/stoma  . Colonoscopy    . Partial hysterectomy    . Cesarean section    . Colostomy takedown  06/27/11  . Cholecystectomy  2012    laparoscopic   Family History  Problem Relation Age of Onset  . Hypertension Other   . Prostate cancer Father   . Colitis Neg Hx   . Esophageal cancer Neg Hx   . Stomach cancer Neg Hx    History  Substance Use Topics  . Smoking status: Never Smoker   . Smokeless tobacco: Never Used  . Alcohol Use: No   OB History    No data available     Review of Systems  All other systems reviewed and are negative.     Allergies  Amiodarone; Amoxicillin; Aspirin; Atenolol; and  Nortriptyline  Home Medications   Prior to Admission medications   Medication Sig Start Date End Date Taking? Authorizing Provider  amLODipine (NORVASC) 5 MG tablet TAKE 1 TABLET (5 MG TOTAL) BY MOUTH DAILY. 10/02/14  Yes Aleksei Plotnikov V, MD  cholecalciferol (VITAMIN D) 1000 UNITS tablet Take 1 tablet (1,000 Units total) by mouth daily. 12/09/13  Yes Aleksei Plotnikov V, MD  donepezil (ARICEPT) 5 MG tablet Take 1 tablet (5 mg total) by mouth at bedtime. 10/02/14  Yes Aleksei Plotnikov V, MD  hydrochlorothiazide (HYDRODIURIL) 25 MG tablet Take 1 tablet (25 mg total) by mouth daily. 06/10/13  Yes Aleksei Plotnikov V, MD  hydrocortisone (PROCTOSOL HC) 2.5 % rectal cream APPLY FOR 7 SEVEN DAYS AT BEDTIME 06/23/14  Yes Aleksei Plotnikov V, MD  LORazepam (ATIVAN) 1 MG tablet Take 1 tablet (1 mg total) by mouth 2 (two) times daily. Patient taking differently: Take 1 mg by mouth every 8 (eight) hours as needed for anxiety.  10/02/14  Yes Aleksei Plotnikov V, MD  OVER THE COUNTER MEDICATION Take 2 capsules by mouth daily. Potassium supplement   Yes Historical Provider, MD  dicyclomine (BENTYL) 10 MG  capsule Take 1 capsule (10 mg total) by mouth 4 (four) times daily -  before meals and at bedtime. TAKE 1 CAPSULE (10 MG TOTAL) BY MOUTH 2 (TWO) TIMES DAILY.  AS NEEDED 10/02/14   Aleksei Plotnikov V, MD   BP 131/71 mmHg  Pulse 69  Temp(Src) 98.7 F (37.1 C) (Oral)  Resp 16  Ht _0  (1.549 m)  Wt 114 lb (51.71 kg)  BMI 21.55 kg/m2  SpO2 96% Physical Exam  Constitutional: She is oriented to person, place, and time. She appears well-developed and well-nourished. No distress.  HENT:  Head: Normocephalic and atraumatic.  Eyes: EOM are normal.  Neck: Normal range of motion.  Cardiovascular: Normal rate, regular rhythm and normal heart sounds.   Pulmonary/Chest: Effort normal and breath sounds normal.  Abdominal: Soft. She exhibits no distension. There is no tenderness.  Musculoskeletal: Normal range of  motion.  No focal weakness in arms or legs.  Gait is abnormal she is unable to stand on her own and she continues to fall backwards from a standing position.  Neurological: She is alert and oriented to person, place, and time.  Skin: Skin is warm and dry.  Psychiatric: She has a normal mood and affect. Judgment normal.  Nursing note and vitals reviewed.   ED Course  Procedures (including critical care time) Labs Review Labs Reviewed  CBC WITH DIFFERENTIAL/PLATELET - Abnormal; Notable for the following:    RBC 3.16 (*)    Hemoglobin 9.5 (*)    HCT 30.2 (*)    Neutrophils Relative % 78 (*)    Lymphs Abs 0.6 (*)    All other components within normal limits  COMPREHENSIVE METABOLIC PANEL - Abnormal; Notable for the following:    Potassium 3.2 (*)    Calcium 11.5 (*)    Total Protein 8.8 (*)    Albumin 2.9 (*)    GFR calc non Af Amer 79 (*)    All other components within normal limits  RETICULOCYTES - Abnormal; Notable for the following:    RBC. 2.74 (*)    All other components within normal limits  TROPONIN I  URINALYSIS, ROUTINE W REFLEX MICROSCOPIC  VITAMIN B12  FOLATE  IRON AND TIBC  FERRITIN  CBG MONITORING, ED    Imaging Review Ct Head Wo Contrast  12/30/2014   CLINICAL DATA:  Acute onset of generalized weakness while walking to bedroom. Initial encounter.  EXAM: CT HEAD WITHOUT CONTRAST  TECHNIQUE: Contiguous axial images were obtained from the base of the skull through the vertex without intravenous contrast.  COMPARISON:  CT of the head performed 10/12/2014  FINDINGS: There is no evidence of acute infarction, mass lesion, or intra- or extra-axial hemorrhage on CT.  Prominence of the ventricles and sulci reflects mild to moderate cortical volume loss. Mild cerebellar atrophy is noted. Scattered periventricular and subcortical white matter change likely reflects small vessel ischemic microangiopathy.  The brainstem and fourth ventricle are within normal limits. The basal  ganglia are unremarkable in appearance. The cerebral hemispheres demonstrate grossly normal gray-white differentiation. No mass effect or midline shift is seen.  There is no evidence of fracture; calvarial lytic lesions are again noted, reflecting the patient's known multiple myeloma. The visualized portions of the orbits are within normal limits. The paranasal sinuses and mastoid air cells are well-aerated. No significant soft tissue abnormalities are seen.  IMPRESSION: 1. No acute intracranial pathology seen on CT. 2. Mild to moderate cortical volume loss and scattered small vessel ischemic microangiopathy. 3.  Calvarial lytic lesions again noted, reflecting the patient's known multiple myeloma.   Electronically Signed   By: Garald Balding M.D.   On: 12/30/2014 07:11  I personally reviewed the imaging tests through PACS system I reviewed available ER/hospitalization records through the EMR    EKG Interpretation   Date/Time:  Saturday December 30 2014 02:38:30 EDT Ventricular Rate:  81 PR Interval:  146 QRS Duration: 97 QT Interval:  334 QTC Calculation: 388 R Axis:   42 Text Interpretation:  Sinus rhythm Minimal ST depression, lateral leads No  significant change was found Confirmed by Ab Leaming  MD, Lennette Bihari ([id number]) on  12/30/2014 6:11:57 AM      MDM   Final diagnoses:  Ataxia    New ataxia.  Patient will need admission the hospital MRI scan of her brain.  She will also likely need physical therapy and occupational therapy.  She has a walker at home but even with that tonight she was unable to walk.  Her ataxia may be secondary to her hypercalcemia which seems to be higher than normal.  Her calcium today is 11.5.  This will need to be considered and addressed.   Jola Schmidt, MD 12/30/14 [place], MD 12/30/14 [phone]

## 2014-12-30 NOTE — Progress Notes (Signed)
RN notified MD of patient's family refusing Lovenox, and other blood thinners at this time. MD also notified that patient only takes 2.5mg  Norvasc, instead of 5mg  (which is the dose that is in the home medication list).

## 2014-12-30 NOTE — ED Notes (Signed)
[facility] presents with a 81 yo female from home with sudden onset of weakness while walking from the bathroom back to bedroom.  Pt had finish using bathroom when she called out for daughter to help her when she got weak and her legs buckled per daughter.  Daughter laid pt to floor.  No LOC. VS stable with [facility] and no orthostasis.

## 2014-12-30 NOTE — Progress Notes (Signed)
Daughter at Nurses station frequently demanding constant immediate attention to various requests. Staff noted daughter to be argumentative at bedside and verbally aggressive.

## 2014-12-30 NOTE — ED Notes (Signed)
Notified floor RN that pt is being transported now

## 2014-12-30 NOTE — Progress Notes (Signed)
RN from [facility] called to get report from ED RN. RN was left on hold. Please call back when available.

## 2014-12-30 NOTE — Progress Notes (Signed)
Attending MD, Rama notified at 1100am, that patient is now in her inpatient room 1609.

## 2014-12-30 NOTE — ED Notes (Signed)
Patient transported to MRI with NT 

## 2014-12-30 NOTE — ED Notes (Signed)
Pt to be held in the ED until another RN is assigned to floor or pt can be assigned another bed. Pt family made aware

## 2014-12-30 NOTE — ED Notes (Signed)
Requested patient to urinate. 

## 2014-12-30 NOTE — ED Notes (Signed)
Pt transported to floor by NT

## 2014-12-30 NOTE — ED Notes (Signed)
Pt. Attempted bed pan for urine but unsuccessful.

## 2014-12-31 LAB — PTH, INTACT AND CALCIUM
Calcium, Total (PTH): 11.4 mg/dL — ABNORMAL HIGH (ref 8.7–10.3)
PTH: 6 pg/mL — ABNORMAL LOW (ref 15–65)

## 2014-12-31 LAB — BASIC METABOLIC PANEL
ANION GAP: 5 (ref 5–15)
BUN: 12 mg/dL (ref 6–23)
CALCIUM: 11.1 mg/dL — AB (ref 8.4–10.5)
CO2: 26 mmol/L (ref 19–32)
Chloride: 110 mmol/L (ref 96–112)
Creatinine, Ser: 0.68 mg/dL (ref 0.50–1.10)
GFR, EST NON AFRICAN AMERICAN: 80 mL/min — AB (ref >90)
Glucose, Bld: 57 mg/dL — ABNORMAL LOW (ref 70–99)
Potassium: 4.2 mmol/L (ref 3.5–5.1)
Sodium: 141 mmol/L (ref 135–145)

## 2014-12-31 MED ORDER — SODIUM CHLORIDE 0.9 % IV SOLN
90.0000 mg | Freq: Once | INTRAVENOUS | Status: AC
  Administered 2014-12-31: 90 mg via INTRAVENOUS
  Filled 2014-12-31: qty 10

## 2014-12-31 MED ORDER — LORAZEPAM 2 MG/ML IJ SOLN
0.5000 mg | Freq: Once | INTRAMUSCULAR | Status: AC
  Administered 2014-12-31: 0.5 mg via INTRAVENOUS
  Filled 2014-12-31: qty 1

## 2014-12-31 MED ORDER — AMLODIPINE BESYLATE 5 MG PO TABS: ORAL_TABLET | ORAL | Status: DC

## 2014-12-31 MED ORDER — LORAZEPAM 1 MG PO TABS: 1.0000 mg | ORAL_TABLET | Freq: Three times a day (TID) | ORAL | Status: DC | PRN

## 2014-12-31 MED ORDER — MAGNESIUM SULFATE 2 GM/50ML IV SOLN
2.0000 g | Freq: Once | INTRAVENOUS | Status: AC
  Administered 2014-12-31: 2 g via INTRAVENOUS
  Filled 2014-12-31: qty 50

## 2014-12-31 NOTE — Progress Notes (Signed)
CARE MANAGEMENT NOTE 12/31/2014  Patient:  Victoria [name], Victoria [name]   Account Number:  [id number]  Date Initiated:  12/31/2014  Documentation initiated by:  [facility]  Subjective/Objective Assessment:   insomnia, ataxia     Action/Plan:   lives with dtr, Victoria [name] [phone]   Anticipated DC Date:  12/31/2014   Anticipated DC Plan:  [place]  CM consult      [facility] Choice  HOME HEALTH   Choice offered to / List presented to:  C-4 Adult Children   DME arranged  3-N-1      DME agency  [place] arranged  [place]   Status of service:  Completed, signed off Medicare Important Message given?  NA - LOS <3 / Initial given by admissions (If response is "NO", the following Medicare IM given date fields will be blank) Date Medicare IM given:   Medicare IM given by:   Date Additional Medicare IM given:   Additional Medicare IM given by:    Discharge Disposition:  [place]  Per UR Regulation:    If discussed at Long Length of Stay Meetings, dates discussed:    Comments:  12/31/2014 1730 Pt's dtr requesting RW with seat. Unit RN will provide dtr with copy of order to take to [facility] home store as there maybe a charge for delivery to home. NCM faxed order to [facility]. [place] DME rep leaves at 5 pm. Jonnie Finner RN CCM Case Mgmt phone [phone]  12/31/2014 1300 NCM spoke to pt and gave permission to speak with dtr, Victoria [name]. Dtr states [name]. She is missing pot from 3n1 and requesting new 3n1. Offered choice for [facility]. Provided HHA list. Dtr requested [place] [facility] for [facility]. Marcella Dubs with new referral. Jonnie Finner RN CCM Case Mgmt phone [phone]

## 2014-12-31 NOTE — Evaluation (Signed)
Physical Therapy Evaluation Patient Details Name: Victoria [name] MRN: [id number] DOB: 05-19-1932 Today's Date: 12/31/2014   History of Present Illness  81 yo female from home with sudden onset of weakness while walking from the bathroom back to bedroom. Pt with hx of multiple myeloma and dementia  Clinical Impression  Pt admitted as above with dx of ataxia and currently presenting as requiring min assist and increased time for safe performance of basic mobility tasks.  Pt and dtr plan dc to home and would greatly benefit from follow up HHPT to further address deficits in strength, endurance and ambulatory stability.    Follow Up Recommendations Home health PT    Equipment Recommendations  None recommended by PT    Recommendations for Other Services OT consult     Precautions / Restrictions Precautions Precautions: Fall Restrictions Weight Bearing Restrictions: No      Mobility  Bed Mobility Overal bed mobility: Needs Assistance Bed Mobility: Supine to Sit     Supine to sit: Min assist     General bed mobility comments: Increased time and min assist to bring trunk to upright  Transfers Overall transfer level: Needs assistance Equipment used: Rolling walker (2 wheeled) Transfers: Sit to/from Stand Sit to Stand: Min assist         General transfer comment: cues for transition position and use of UEs to self assist.  Min assist to rise and steady  Ambulation/Gait Ambulation/Gait assistance: Min assist;Min guard Ambulation Distance (Feet): 100 Feet Assistive device: Rolling walker (2 wheeled) Gait Pattern/deviations: Step-through pattern;Decreased step length - right;Decreased step length - left;Shuffle;Trunk flexed Gait velocity: decr   General Gait Details: cues for posture and position from RW: Min assist for stability and RW management  Stairs Stairs: Yes Stairs assistance: Min assist Stair Management: Two rails;Forwards;Step to pattern Number of Stairs:  2 General stair comments: Min cues for sequencing, min assist for balance/support  Wheelchair Mobility    Modified Rankin (Stroke Patients Only)       Balance                                             Pertinent Vitals/Pain Pain Assessment: No/denies pain    Home Living Family/patient expects to be discharged to:: Private residence Living Arrangements: Children Available Help at Discharge: Family Type of Home: House Home Access: Stairs to enter Entrance Stairs-Rails: Can reach both Entrance Stairs-Number of Steps: 4 Home Layout: One level Home Equipment: Tub bench;Walker - 2 wheels;Cane - single point;Bedside commode      Prior Function Level of Independence: Independent;Independent with assistive device(s)         Comments: Dtr states mother stays home alone but she can be available 24/7 temporarily     Hand Dominance        Extremity/Trunk Assessment   Upper Extremity Assessment: Generalized weakness           Lower Extremity Assessment: Generalized weakness      Cervical / Trunk Assessment: Kyphotic  Communication   Communication: No difficulties  Cognition Arousal/Alertness: Lethargic Behavior During Therapy: WFL for tasks assessed/performed Overall Cognitive Status: Within Functional Limits for tasks assessed                      General Comments      Exercises        Assessment/Plan  PT Assessment Patient needs continued PT services  PT Diagnosis Difficulty walking   PT Problem List Decreased strength;Decreased activity tolerance;Decreased balance;Decreased mobility;Decreased knowledge of use of DME  PT Treatment Interventions DME instruction;Gait training;Stair training;Functional mobility training;Therapeutic activities;Therapeutic exercise;Patient/family education   PT Goals (Current goals can be found in the Care Plan section) Acute Rehab PT Goals Patient Stated Goal: HOME PT Goal Formulation: With  patient/family Time For Goal Achievement: 01/14/15 Potential to Achieve Goals: Good    Frequency Min 3X/week   Barriers to discharge        Co-evaluation PT/OT/SLP Co-Evaluation/Treatment: Yes Reason for Co-Treatment: For patient/therapist safety PT goals addressed during session: Mobility/safety with mobility OT goals addressed during session: ADL's and self-care       End of Session Equipment Utilized During Treatment: Gait belt Activity Tolerance: Patient tolerated treatment well Patient left: in chair;with call bell/phone within reach;with family/visitor present;Other (comment) (OT setting up for bathing) Nurse Communication: Mobility status         Time: 7519-8242 PT Time Calculation (min) (ACUTE ONLY): 35 min   Charges:   PT Evaluation $Initial PT Evaluation Tier I: 1 Procedure PT Treatments $Gait Training: 8-22 mins   PT G Codes:        Cope Marte 2015-01-29, 11:48 AM

## 2014-12-31 NOTE — Evaluation (Signed)
Occupational Therapy Evaluation Patient Details Name: Victoria [name] MRN: [id number] DOB: 07/29/1932 Today's Date: 12/31/2014    History of Present Illness Pt with hx multiple myeloma and dementia   Clinical Impression   Pt admitted with weakness. Pt currently with functional limitations due to the deficits listed below (see OT Problem List).  Pt will benefit from skilled home health OT to increase their safety and independence with ADL in her home environment    Follow Up Recommendations  Home health OT    Equipment Recommendations  None recommended by OT    Recommendations for Other Services       Precautions / Restrictions Precautions Precautions: Fall Restrictions Weight Bearing Restrictions: No      Mobility Bed Mobility Overal bed mobility: Needs Assistance Bed Mobility: Supine to Sit     Supine to sit: Min assist     General bed mobility comments: Increased time and min assist to bring trunk to upright  Transfers Overall transfer level: Needs assistance Equipment used: Rolling walker (2 wheeled) Transfers: Sit to/from Stand Sit to Stand: Min assist         General transfer comment: cues for transition position and use of UEs to self assist.  Min assist to rise and steady    Balance                                            ADL Overall ADL's : Needs assistance/impaired     Grooming: Wash/dry face;Sitting;Set up   Upper Body Bathing: Minimal assitance;Sitting   Lower Body Bathing: Moderate assistance;Sit to/from stand   Upper Body Dressing : Minimal assistance;Sitting   Lower Body Dressing: Minimal assistance;Sit to/from stand   Toilet Transfer: Minimal assistance;Ambulation;RW Toilet Transfer Details (indicate cue type and reason): simulated bed to chair Toileting- Clothing Manipulation and Hygiene: Minimal assistance;Sit to/from stand         General ADL Comments: OT set pt up for bath and pts daughter ask if OT  could give any sugestions for making her bath easier after daugter told OT this was not a concern. OT suggested [place] for this as OT could A pt in her natural environment which would be more beneficial.  Pt ask her daugther to help her with bath .  OT also communicated with CNA who went and helped pt finish bath.  Pts daughter often spoke over pt .  Pt needed VC for safety and sequencing during entire session.               Pertinent Vitals/Pain Pain Assessment: No/denies pain        Extremity/Trunk Assessment Upper Extremity Assessment Upper Extremity Assessment: Generalized weakness   Lower Extremity Assessment Lower Extremity Assessment: Generalized weakness   Cervical / Trunk Assessment Cervical / Trunk Assessment: Kyphotic   Communication Communication Communication: No difficulties   Cognition Arousal/Alertness: Lethargic Behavior During Therapy: WFL for tasks assessed/performed Overall Cognitive Status: Within Functional Limits for tasks assessed                     General Comments    Pt will benefit from [facility] to address ADL activity in her home environment           Home Living Family/patient expects to be discharged to:: Private residence Living Arrangements: Children Available Help at Discharge: Family Type of Home: House Home Access: Stairs  to enter Entrance Stairs-Number of Steps: 4 Entrance Stairs-Rails: Can reach both Home Layout: One level     Bathroom Shower/Tub: Teacher, early years/pre: Handicapped height     Home Equipment: Tub bench;Walker - 2 wheels;Cane - single point;Bedside commode          Prior Functioning/Environment Level of Independence: Independent;Independent with assistive device(s)        Comments: Dtr states mother stays home alone but she can be available 24/7 temporarily       OT Problem List: Decreased strength      OT Goals(Current goals can be found in the care plan section) Acute Rehab OT  Goals Patient Stated Goal: HOME  OT Frequency:             Co-evaluation PT/OT/SLP Co-Evaluation/Treatment: Yes Reason for Co-Treatment: For patient/therapist safety PT goals addressed during session: Mobility/safety with mobility OT goals addressed during session: ADL's and self-care      End of Session Equipment Utilized During Treatment: Gait belt;Rolling walker Nurse Communication: Mobility status  Activity Tolerance: Patient tolerated treatment well Patient left: in chair;with family/visitor present   Time: 1050-1131 OT Time Calculation (min): 41 min Charges:  OT General Charges $OT Visit: 1 Procedure OT Evaluation $Initial OT Evaluation Tier I: 1 Procedure OT Treatments $Self Care/Home Management : 23-37 mins G-Codes:    Payton Mccallum D 2015-01-27, 12:28 PM

## 2014-12-31 NOTE — Progress Notes (Addendum)
Discharged from floor via w/c, daughter & belongings with pt. Order for 4-wheel walker with seat given to daughter for picking up from [place]. No changes in assessment. Field Staniszewski, [employer]

## 2014-12-31 NOTE — Discharge Summary (Signed)
Physician Discharge Summary  Victoria [name] VCB:[id number] DOB: 06-01-1932 DOA: 12/30/2014  PCP: Walker Kehr, MD  Admit date: 12/30/2014 Discharge date: 12/31/2014   Recommendations for Outpatient Follow-Up:   1. F/U with PCP in 3 days to re-check electrolytes.   Discharge Diagnosis:   Principal Problem:    Ataxia likely from muscle weakness due to electrolyte imbalance Active Problems:    INSOMNIA, PERSISTENT    Essential hypertension    Hypercalcemia    Hypokalemia    Multiple myeloma in remission    Hypomagnesemia   Discharge Condition: Improved.  Diet recommendation: Regular.   History of Present Illness:   Victoria [name] is an 81 y.o. female with a PMH of multiple myeloma, dementia and hypertension who presented to the ER with new onset of ataxic gait and near fall. Patient sees Dr. Alvy Bimler for treatment of myeloma, which was originally diagnosed 06/2007. She has received chemotherapy in the past for this, however her disease has remained stable and has not been on any active therapy since 08/2013. She has had mild long-standing hypercalcemia for which she has not needed treatment. She was started on Aricept January 2016 for dementia with behavioral disturbance and memory impairment. She was admitted 12/29/14 with acute onset of gait difficulty/weakness.   Hospital Course by Problem:   Principal Problem:  Ataxia secondary to muscular weakness from electrolyte imbalance - CT/MRI of the head negative for acute findings. - PT/OT evaluations requested.  Home PT/OT set up. - Improved with correction of underlying electrolyte abnormalities. - TSH WNL, no B-12 deficiency.  Active Problems:  INSOMNIA, PERSISTENT - Likely related to dementia with disturbance in sleep/wake cycle.   Essential hypertension - Continue amlodipine.   Hypercalcemia - Vigorously hydrated. PTH low.  Given pamidronate 12/31/14.   Hypokalemia/Hypomagnesemia - Corrected.   Multiple  myeloma in remission - F/U Dr. Alvy Bimler.    Medical Consultants:    None.   Discharge Exam:   Filed Vitals:   12/31/14 0434  BP: 158/63  Pulse: 63  Temp:   Resp: 16   Filed Vitals:   12/30/14 1059 12/30/14 1429 12/30/14 2015 12/31/14 0434  BP: 131/62 130/60 139/63 158/63  Pulse: 65 63 66 63  Temp: 97.8 F (36.6 C) 98.1 F (36.7 C) 98 F (36.7 C)   TempSrc: Oral Axillary Oral   Resp: _0 Height:      Weight:      SpO2: 96% 96% 98% 99%    Gen:  NAD Cardiovascular:  RRR, No M/R/G Respiratory: Lungs CTAB Gastrointestinal: Abdomen soft, NT/ND with normal active bowel sounds. Extremities: No C/E/C   The results of significant diagnostics from this hospitalization (including imaging, microbiology, ancillary and laboratory) are listed below for reference.     Procedures and Diagnostic Studies:   Ct Head Wo Contrast 12/30/2014: 1. No acute intracranial pathology seen on CT. 2. Mild to moderate cortical volume loss and scattered small vessel ischemic microangiopathy. 3. Calvarial lytic lesions again noted, reflecting the patient's known multiple myeloma.   Mr Brain Wo Contrast 12/30/2014: No acute brain finding. Age related atrophy and chronic small vessel disease. Multi focal bone lesions consistent with metastatic disease or myeloma.    Labs:   Basic Metabolic Panel:  Recent Labs Lab 12/30/14 0319 12/30/14 0835 12/30/14 1432 12/31/14 0739  NA 142  --   --  141  K 3.2*  --   --  4.2  CL 108  --   --  110  CO2 29  --   --  26  GLUCOSE 87  --   --  57*  BUN 18  --   --  12  CREATININE 0.71  --   --  0.68  CALCIUM 11.5* 11.4*  --  11.1*  MG  --   --  1.4*  --    GFR Estimated Creatinine Clearance: 42.3 mL/min (by C-G formula based on Cr of 0.68). Liver Function Tests:  Recent Labs Lab 12/30/14 0319  AST 19  ALT 10  ALKPHOS 85  BILITOT 0.7  PROT 8.8*  ALBUMIN 2.9*   CBC:  Recent Labs Lab 12/30/14 0319  WBC 4.9  NEUTROABS 3.8  HGB  9.5*  HCT 30.2*  MCV 95.6  PLT 230   Cardiac Enzymes:  Recent Labs Lab 12/30/14 0319  TROPONINI 0.03   CBG:  Recent Labs Lab 12/30/14 0259  GLUCAP 76   Thyroid function studies  Recent Labs  12/30/14 1432  TSH 0.762   Anemia work up  Recent Labs  12/30/14 0721  VITAMINB12 1514*  FOLATE >20.0  FERRITIN 249  TIBC 196*  IRON 20*  RETICCTPCT 1.4   Microbiology No results found for this or any previous visit (from the past 240 hour(s)).   Discharge Instructions:   Discharge Instructions    Call MD for:  extreme fatigue    Complete by:  As directed      Call MD for:    Complete by:  As directed   Ongoing trouble with ambulating.     Diet general    Complete by:  As directed      Increase activity slowly    Complete by:  As directed      Walker     Complete by:  As directed             Medication List    TAKE these medications        amLODipine 5 MG tablet  Commonly known as:  NORVASC  TAKES 0.5 TABLET (2.5 MG TOTAL) BY MOUTH DAILY.     cholecalciferol 1000 UNITS tablet  Commonly known as:  VITAMIN D  Take 1 tablet (1,000 Units total) by mouth daily.     dicyclomine 10 MG capsule  Commonly known as:  BENTYL  Take 1 capsule (10 mg total) by mouth 4 (four) times daily -  before meals and at bedtime. TAKE 1 CAPSULE (10 MG TOTAL) BY MOUTH 2 (TWO) TIMES DAILY.  AS NEEDED     donepezil 5 MG tablet  Commonly known as:  ARICEPT  Take 1 tablet (5 mg total) by mouth at bedtime.     hydrochlorothiazide 25 MG tablet  Commonly known as:  HYDRODIURIL  Take 1 tablet (25 mg total) by mouth daily.     hydrocortisone 2.5 % rectal cream  Commonly known as:  PROCTOSOL HC  APPLY FOR 7 SEVEN DAYS AT BEDTIME     LORazepam 1 MG tablet  Commonly known as:  ATIVAN  Take 1 tablet (1 mg total) by mouth every 8 (eight) hours as needed for anxiety.     OVER THE COUNTER MEDICATION  Take 2 capsules by mouth daily. Potassium supplement           Follow-up  Information    Follow up with Walker Kehr, MD. Schedule an appointment as soon as possible for a visit in 3 days.   Specialty:  Internal Medicine   Why:  Re-check labs including potassium, magnesium and  calcium.   Contact information:   [place] [address] [phone]        Time coordinating discharge: 35 minutes.  Signed:  Aliciana Ricciardi  Pager [phone] [facility] 12/31/2014, 2:02 PM

## 2014-12-31 NOTE — Progress Notes (Signed)
CARE MANAGEMENT NOTE 12/31/2014  Patient:  Victoria [name], Victoria [name]   Account Number:  [id number]  Date Initiated:  12/31/2014  Documentation initiated by:  [facility]  Subjective/Objective Assessment:   insomnia, ataxia     Action/Plan:   lives with dtr, Victoria [name] [phone]   Anticipated DC Date:  12/31/2014   Anticipated DC Plan:  [place]  CM consult      [facility] Choice  HOME HEALTH   Choice offered to / List presented to:  C-4 Adult Children   DME arranged  3-N-1      DME agency  [place] arranged  [place]   Status of service:  Completed, signed off Medicare Important Message given?  NA - LOS <3 / Initial given by admissions (If response is "NO", the following Medicare IM given date fields will be blank) Date Medicare IM given:   Medicare IM given by:   Date Additional Medicare IM given:   Additional Medicare IM given by:    Discharge Disposition:  [place]  Per UR Regulation:    If discussed at Long Length of Stay Meetings, dates discussed:    Comments:  12/31/2014 1300 NCM spoke to pt and gave permission to speak with dtr, Victoria [name]. Dtr states [name]. She is missing pot from 3n1 and requesting new 3n1. Offered choice for [facility]. Provided HHA list. Dtr requested [place] [facility] for [facility]. Marcella Dubs with new referral. Jonnie Finner RN CCM Case Mgmt phone [phone]

## 2015-01-01 ENCOUNTER — Telehealth: Payer: Self-pay | Admitting: Internal Medicine

## 2015-01-01 DIAGNOSIS — I10 Essential (primary) hypertension: Secondary | ICD-10-CM

## 2015-01-01 NOTE — Telephone Encounter (Signed)
Work in on [employer], Mg, Vit D, PTH on Wed Thx

## 2015-01-01 NOTE — Telephone Encounter (Signed)
Pt daughter called in Ebony Hail) pt was in hosp on Sat the 2nd and sun the 3rd. Hosp told her to fu this week and there isnt any slots available.  Pt doesn't want to see anyone else and wants to come in this week?  What do you  suggest?    Best number [phone]

## 2015-01-01 NOTE — Telephone Encounter (Signed)
Called pt concerning hosp f/u per d/c summary must be f/u in 3 days. See msg below...Victoria [name]

## 2015-01-02 NOTE — Telephone Encounter (Signed)
P/t's daughter informed

## 2015-01-02 NOTE — Telephone Encounter (Signed)
Called daughter Ebony Hail) completed TCM call below  Transition Care Management Follow-up Telephone Call D/C 12/31/14  How have you been since you were released from the hospital? Spoke with daughter she stated mom is doing better   Do you understand why you were in the hospital? YES   Do you understand the discharge instrcutions? YES  Items Reviewed:  Medications reviewed: YES  Allergies reviewed: YES  Dietary changes reviewed: NO  Referrals reviewed: No referrals needed   Functional Questionnaire:   Activities of Daily Living (ADLs):   She states mom  are independent in the following: ambulation, bathing and hygiene, feeding, grooming, toileting and dressing States  She can do for herself family help if she need assistance    Any transportation issues/concerns?: NO   Any patient concerns? NO   Confirmed importance and date/time of follow-up visits scheduled: YES, made appt for 01/04/15 w/Dr. plotnikov he is also wanting pt to have labs done prior. Labs has been ordered   Confirmed with patient if condition begins to worsen call PCP or go to the ER.

## 2015-01-03 ENCOUNTER — Other Ambulatory Visit (INDEPENDENT_AMBULATORY_CARE_PROVIDER_SITE_OTHER): Payer: Medicare Other

## 2015-01-03 DIAGNOSIS — I10 Essential (primary) hypertension: Secondary | ICD-10-CM | POA: Diagnosis not present

## 2015-01-04 ENCOUNTER — Inpatient Hospital Stay: Payer: Medicare Other | Admitting: Internal Medicine

## 2015-01-04 ENCOUNTER — Telehealth: Payer: Self-pay | Admitting: Internal Medicine

## 2015-01-04 LAB — BASIC METABOLIC PANEL
BUN: 17 mg/dL (ref 6–23)
CO2: 29 mEq/L (ref 19–32)
CREATININE: 0.84 mg/dL (ref 0.40–1.20)
Calcium: 10.4 mg/dL (ref 8.4–10.5)
Chloride: 105 mEq/L (ref 96–112)
GFR: 83.69 mL/min (ref >60.00)
GLUCOSE: 152 mg/dL — AB (ref 70–99)
POTASSIUM: 3.8 meq/L (ref 3.5–5.1)
Sodium: 140 mEq/L (ref 135–145)

## 2015-01-04 LAB — PTH, INTACT AND CALCIUM
Calcium: 9.6 mg/dL (ref 8.4–10.5)
PTH: 11 pg/mL — ABNORMAL LOW (ref 14–64)

## 2015-01-04 LAB — VITAMIN D 25 HYDROXY (VIT D DEFICIENCY, FRACTURES): VITD: 21.5 ng/mL — AB (ref 30.00–100.00)

## 2015-01-04 NOTE — Telephone Encounter (Signed)
i called and talked with barbara at [facility]---that office had closed for today, she will be leaving message with that office to call me tomorrow am----we still need that form faxed over to [place] for pt to get pull ups---patient advised

## 2015-01-04 NOTE — Telephone Encounter (Signed)
Pt was 40 min late today for the appt due to miss understanding for the appt time. Offer to reschedule but pt's daughter request a phone call because it is hard for them to bring pt out. Please advise, pt would like to know if we can do a phone call instead of coming in for the appt for the hospital f/u.

## 2015-01-04 NOTE — Telephone Encounter (Signed)
OK to call the pt - pls call Thx

## 2015-01-05 ENCOUNTER — Telehealth: Payer: Self-pay | Admitting: Internal Medicine

## 2015-01-05 MED ORDER — ERGOCALCIFEROL 1.25 MG (50000 UT) PO CAPS
50000.0000 [IU] | ORAL_CAPSULE | ORAL | Status: DC
Start: 2015-01-05 — End: 2015-05-21

## 2015-01-05 NOTE — Telephone Encounter (Signed)
Victoria [name], please, inform patient that all labs are ok except for a low Vit D Start Rx Vit D - emailed Rx OV if not better Thx

## 2015-01-05 NOTE — Telephone Encounter (Signed)
Called Erin no answer LMOM with md response...Johny Chess

## 2015-01-05 NOTE — Telephone Encounter (Signed)
Patient may some transitional counseling. She may be suffering from depression. Daughter is having a hard time getting her in here.

## 2015-01-05 NOTE — Telephone Encounter (Signed)
Patient came late to her appointment yesterday and was unable to be seen and she is wondering if you are able to talk over the phone about her lab work. Not sure when she'll be able to come back in.

## 2015-01-05 NOTE — Telephone Encounter (Signed)
I called Pt's daughter- she states pt is extremely weak. She also feels the patient is depressed now because she can't do things, is in pain and she is always speaking negatively. I informed her daughter of her 01/03/15 labs. Is there anything she should be doing different? Please advise

## 2015-01-05 NOTE — Telephone Encounter (Signed)
See phone note dated 01/05/15.

## 2015-01-05 NOTE — Telephone Encounter (Signed)
Needs verbal Dr. Etheleen Mayhew for Physical therapy for 1x week for 1 week and 2x week for 4 weeks.

## 2015-01-05 NOTE — Telephone Encounter (Signed)
P/t's daughter informed

## 2015-01-05 NOTE — Telephone Encounter (Signed)
Ok Thx 

## 2015-01-11 ENCOUNTER — Telehealth: Payer: Self-pay | Admitting: *Deleted

## 2015-01-11 NOTE — Telephone Encounter (Signed)
Ok Thx 

## 2015-01-11 NOTE — Telephone Encounter (Signed)
Left msg on triage requesting verbal orders to continue seeing pt for 3 x's a week for 2 wks, then 2x's wk for 2 wks. Will be working on gait training, balance, strength, and fall prevention...Johny Chess

## 2015-01-11 NOTE — Telephone Encounter (Signed)
Called Erin back no answer LMOM with md response...Johny Chess

## 2015-01-15 ENCOUNTER — Encounter: Payer: Self-pay | Admitting: Internal Medicine

## 2015-01-15 ENCOUNTER — Ambulatory Visit (INDEPENDENT_AMBULATORY_CARE_PROVIDER_SITE_OTHER): Payer: Medicare Other | Admitting: Internal Medicine

## 2015-01-15 VITALS — BP 110/66 | HR 98 | Temp 98.8°F | Resp 15 | Wt 100.0 lb

## 2015-01-15 DIAGNOSIS — F329 Major depressive disorder, single episode, unspecified: Secondary | ICD-10-CM | POA: Diagnosis not present

## 2015-01-15 DIAGNOSIS — F03918 Unspecified dementia, unspecified severity, with other behavioral disturbance: Secondary | ICD-10-CM

## 2015-01-15 DIAGNOSIS — F0391 Unspecified dementia with behavioral disturbance: Secondary | ICD-10-CM

## 2015-01-15 DIAGNOSIS — D649 Anemia, unspecified: Secondary | ICD-10-CM

## 2015-01-15 DIAGNOSIS — G479 Sleep disorder, unspecified: Secondary | ICD-10-CM

## 2015-01-15 DIAGNOSIS — F32A Depression, unspecified: Secondary | ICD-10-CM

## 2015-01-15 DIAGNOSIS — R630 Anorexia: Secondary | ICD-10-CM

## 2015-01-15 MED ORDER — SERTRALINE HCL 25 MG PO TABS: 25.0000 mg | ORAL_TABLET | Freq: Every day | ORAL | Status: DC

## 2015-01-15 NOTE — Patient Instructions (Signed)
To prevent sleep dysfunction follow these instructions for sleep hygiene. Do not read, watch TV, or eat in bed. Do not get into bed until you are ready to turn off the light &  to go to sleep. Do not ingest stimulants ( decongestants, diet pills, nicotine, caffeine) after the evening meal.Do not take daytime naps.   Your next office appointment will be determined based upon review of your pending labs .  Those instructions will be transmitted to you by mail for your records.  Critical results will be called.   Followup as needed for any active or acute issue. Please report any significant change in your symptoms.

## 2015-01-15 NOTE — Progress Notes (Signed)
Pre visit review using our clinic review tool, if applicable. No additional management support is needed unless otherwise documented below in the visit note. 

## 2015-01-15 NOTE — Progress Notes (Signed)
   Subjective:    Patient ID: Victoria [name], female    DOB: 12-19-1931, 81 y.o.   MRN: [id number]  HPI She is brought in by her daughter and son who are concerned about her excess somnolence which has been present for at least a month. She also has anorexia. She resists increased activity due to pain, especially in the legs.  There is some question about how much lorazepam she is taking. She's given a half in the morning; she may be taking more during the day and evening.  She recently had extensive labs performed. TSH was therapeutic at 0.762. Magnesium was minimally reduced at 1.4. In January of this year her hemoglobin was 11.5 and hematocrit 36.7. On 4/2 hemoglobin was 9.5 and hematocrit 30.2. No bleeding dyscrasias have been documented.  She has a prescription for Bentyl but has not been taking this according to the family.   Review of Systems  Epistaxis, hemoptysis, hematuria, melena, or rectal bleeding denied. No unexplained weight loss, significant dyspepsia,dysphagia, or abdominal pain.  There is no abnormal bruising , bleeding, or difficulty stopping bleeding with injury. The family feels that she is depressed. They state that she will express anger intermittently and is very demanding.    Objective:   Physical Exam Pertinent or positive findings include: She is lethargic. Bilateral ptosis is present.  She has an upper partial.  The first heart sound is accentuated.  Breath sounds are decreased but she has no increased work of breathing.  Bowel sounds are active.  Pedal pulses are somewhat decreased but equal.  She has minimal pedal edema. She knewe her daughter's birthday ; she was incorrect on the year for the son . Initially she identified the President as Land. She subsequently stated it was Mr Mady Gemma..   General appearance : thin but adequately nourished; in no distress. Eyes: No conjunctival inflammation or scleral icterus is present. Oral exam:  Lips  and gums are healthy appearing.There is no oropharyngeal erythema or exudate noted.  Heart:  Normal rate and regular rhythm. S2 normal without gallop, murmur, click, rub or other extra sounds   Lungs:Chest clear to auscultation; no wheezes, rhonchi,rales ,or rubs present.No increased work of breathing.  Abdomen: bowel sounds normal, soft and non-tender without masses, organomegaly or hernias noted.  No guarding or rebound.  Vascular : all pulses equal ; no bruits present. Skin:Warm & dry.  Intact without suspicious lesions or rashes ; no tenting  Lymphatic: No lymphadenopathy is noted about the head, neck, axilla Neuro: Strength, tone decreased        Assessment & Plan:  #1 anemia  #2 sleep disorder most likely related to poor sleep hygiene   #3 dementia with some behavioral pattern in the past  #4 depression  #5 anorexia  See orders & AVS

## 2015-01-16 ENCOUNTER — Telehealth: Payer: Self-pay | Admitting: Internal Medicine

## 2015-01-16 DIAGNOSIS — F4321 Adjustment disorder with depressed mood: Secondary | ICD-10-CM

## 2015-01-16 NOTE — Telephone Encounter (Signed)
Please advise, thanks.

## 2015-01-16 NOTE — Telephone Encounter (Signed)
Ok

## 2015-01-16 NOTE — Telephone Encounter (Signed)
Pt daughter and she is requesting a referral to a counselor .  She said that she is chanting randomly    [phone]

## 2015-01-17 ENCOUNTER — Other Ambulatory Visit: Payer: Self-pay | Admitting: *Deleted

## 2015-01-17 ENCOUNTER — Other Ambulatory Visit (INDEPENDENT_AMBULATORY_CARE_PROVIDER_SITE_OTHER): Payer: Medicare Other

## 2015-01-17 ENCOUNTER — Telehealth: Payer: Self-pay | Admitting: Internal Medicine

## 2015-01-17 DIAGNOSIS — I10 Essential (primary) hypertension: Secondary | ICD-10-CM

## 2015-01-17 DIAGNOSIS — D649 Anemia, unspecified: Secondary | ICD-10-CM | POA: Diagnosis not present

## 2015-01-17 LAB — CBC WITH DIFFERENTIAL/PLATELET
Basophils Absolute: 0 10*3/uL (ref 0.0–0.1)
Basophils Relative: 0.2 % (ref 0.0–3.0)
Eosinophils Absolute: 0 10*3/uL (ref 0.0–0.7)
Eosinophils Relative: 0.4 % (ref 0.0–5.0)
HCT: 27.4 % — ABNORMAL LOW (ref 36.0–46.0)
LYMPHS ABS: 0.6 10*3/uL — AB (ref 0.7–4.0)
Lymphocytes Relative: 13 % (ref 12.0–46.0)
MCHC: 32.3 g/dL (ref 30.0–36.0)
MCV: 89.1 fl (ref 78.0–100.0)
Monocytes Absolute: 0.2 10*3/uL (ref 0.1–1.0)
Monocytes Relative: 4.5 % (ref 3.0–12.0)
NEUTROS ABS: 3.5 10*3/uL (ref 1.4–7.7)
Neutrophils Relative %: 81.9 % — ABNORMAL HIGH (ref 43.0–77.0)
Platelets: 403 10*3/uL — ABNORMAL HIGH (ref 150.0–400.0)
RBC: 3.07 Mil/uL — AB (ref 3.87–5.11)
RDW: 16.7 % — ABNORMAL HIGH (ref 11.5–15.5)
WBC: 4.3 10*3/uL (ref 4.0–10.5)

## 2015-01-17 LAB — IBC PANEL
Iron: 31 ug/dL — ABNORMAL LOW (ref 42–145)
Saturation Ratios: 13.9 % — ABNORMAL LOW (ref 20.0–50.0)
Transferrin: 159 mg/dL — ABNORMAL LOW (ref 212.0–360.0)

## 2015-01-17 LAB — FERRITIN: FERRITIN: 292.2 ng/mL — AB (ref 10.0–291.0)

## 2015-01-17 LAB — VITAMIN B12: VITAMIN B 12: 1293 pg/mL — AB (ref 211–911)

## 2015-01-17 MED ORDER — SERTRALINE HCL 25 MG PO TABS: 25.0000 mg | ORAL_TABLET | Freq: Every day | ORAL | Status: DC

## 2015-01-17 NOTE — Telephone Encounter (Signed)
Sorry that was day copier was down Sertraline 25 mg qd # 30 ,R X 2

## 2015-01-17 NOTE — Telephone Encounter (Signed)
Please enter referral, thanks

## 2015-01-17 NOTE — Telephone Encounter (Signed)
Prescription has been electronically sent. 

## 2015-01-17 NOTE — Telephone Encounter (Signed)
Advised ms Espinoza's daughter, referral has been sent by dr Kathi Simpers confirmed receipt of zoloft by patient pharm showing 4/20 at 2:51pm

## 2015-01-17 NOTE — Telephone Encounter (Signed)
Lab results are not available yet.   Okay for Zoloft refill?

## 2015-01-17 NOTE — Telephone Encounter (Signed)
Ok zoloft 6 mo Thx

## 2015-01-17 NOTE — Telephone Encounter (Signed)
Daughter is requesting zoloft to be resent to [facility].  Pharmacy states they have not received script.  Also is requesting call back with lab results for mother.

## 2015-01-20 ENCOUNTER — Emergency Department (HOSPITAL_COMMUNITY)
Admission: EM | Admit: 2015-01-20 | Discharge: 2015-01-21 | Disposition: A | Discharge status: Home / Self Care | Payer: Medicare Other | Attending: Emergency Medicine | Admitting: Emergency Medicine

## 2015-01-20 ENCOUNTER — Encounter (HOSPITAL_COMMUNITY): Payer: Self-pay | Admitting: *Deleted

## 2015-01-20 DIAGNOSIS — F419 Anxiety disorder, unspecified: Secondary | ICD-10-CM | POA: Diagnosis not present

## 2015-01-20 DIAGNOSIS — F329 Major depressive disorder, single episode, unspecified: Secondary | ICD-10-CM

## 2015-01-20 DIAGNOSIS — Z008 Encounter for other general examination: Secondary | ICD-10-CM | POA: Diagnosis present

## 2015-01-20 DIAGNOSIS — Z79899 Other long term (current) drug therapy: Secondary | ICD-10-CM | POA: Insufficient documentation

## 2015-01-20 DIAGNOSIS — Z8669 Personal history of other diseases of the nervous system and sense organs: Secondary | ICD-10-CM | POA: Insufficient documentation

## 2015-01-20 DIAGNOSIS — I10 Essential (primary) hypertension: Secondary | ICD-10-CM | POA: Insufficient documentation

## 2015-01-20 DIAGNOSIS — Z8579 Personal history of other malignant neoplasms of lymphoid, hematopoietic and related tissues: Secondary | ICD-10-CM | POA: Diagnosis not present

## 2015-01-20 DIAGNOSIS — Z862 Personal history of diseases of the blood and blood-forming organs and certain disorders involving the immune mechanism: Secondary | ICD-10-CM | POA: Diagnosis not present

## 2015-01-20 DIAGNOSIS — Z880 Allergy status to penicillin: Secondary | ICD-10-CM | POA: Insufficient documentation

## 2015-01-20 DIAGNOSIS — Z8719 Personal history of other diseases of the digestive system: Secondary | ICD-10-CM | POA: Diagnosis not present

## 2015-01-20 DIAGNOSIS — F32A Depression, unspecified: Secondary | ICD-10-CM

## 2015-01-20 NOTE — ED Notes (Signed)
Pt's daughter reports pt needs psychiatric evaluation.  Pt has been progressively worse.  She reports pt has not been eating or drinking and sleeping.  At times, she would not come out of the car.

## 2015-01-21 LAB — CBC
HEMATOCRIT: 27.8 % — AB (ref 36.0–46.0)
Hemoglobin: 8.4 g/dL — ABNORMAL LOW (ref 12.0–15.0)
MCH: 28.6 pg (ref 26.0–34.0)
MCHC: 30.2 g/dL (ref 30.0–36.0)
MCV: 94.6 fL (ref 78.0–100.0)
PLATELETS: 342 10*3/uL (ref 150–400)
RBC: 2.94 MIL/uL — ABNORMAL LOW (ref 3.87–5.11)
RDW: 16.3 % — AB (ref 11.5–15.5)
WBC: 3.9 10*3/uL — AB (ref 4.0–10.5)

## 2015-01-21 LAB — RAPID URINE DRUG SCREEN, HOSP PERFORMED
Amphetamines: NOT DETECTED
BENZODIAZEPINES: NOT DETECTED
Barbiturates: NOT DETECTED
COCAINE: NOT DETECTED
Opiates: NOT DETECTED
TETRAHYDROCANNABINOL: NOT DETECTED

## 2015-01-21 LAB — URINE MICROSCOPIC-ADD ON

## 2015-01-21 LAB — URINALYSIS, ROUTINE W REFLEX MICROSCOPIC
Bilirubin Urine: NEGATIVE
Glucose, UA: NEGATIVE mg/dL
Leukocytes, UA: NEGATIVE
Nitrite: NEGATIVE
Protein, ur: 100 mg/dL — AB
Specific Gravity, Urine: 1.02 (ref 1.005–1.030)
Urobilinogen, UA: 0.2 mg/dL (ref 0.0–1.0)
pH: 6 (ref 5.0–8.0)

## 2015-01-21 LAB — COMPREHENSIVE METABOLIC PANEL
ALT: 12 U/L (ref 0–35)
ANION GAP: 3 — AB (ref 5–15)
AST: 22 U/L (ref 0–37)
Albumin: 2.7 g/dL — ABNORMAL LOW (ref 3.5–5.2)
Alkaline Phosphatase: 132 U/L — ABNORMAL HIGH (ref 39–117)
BUN: 19 mg/dL (ref 6–23)
CHLORIDE: 111 mmol/L (ref 96–112)
CO2: 25 mmol/L (ref 19–32)
Calcium: 8.7 mg/dL (ref 8.4–10.5)
Creatinine, Ser: 0.79 mg/dL (ref 0.50–1.10)
GFR calc Af Amer: 88 mL/min — ABNORMAL LOW (ref >90)
GFR, EST NON AFRICAN AMERICAN: 76 mL/min — AB (ref >90)
Glucose, Bld: 80 mg/dL (ref 70–99)
Potassium: 3.9 mmol/L (ref 3.5–5.1)
SODIUM: 139 mmol/L (ref 135–145)
TOTAL PROTEIN: 8.9 g/dL — AB (ref 6.0–8.3)
Total Bilirubin: 0.6 mg/dL (ref 0.3–1.2)

## 2015-01-21 LAB — ACETAMINOPHEN LEVEL: Acetaminophen (Tylenol), Serum: <10 ug/mL — ABNORMAL LOW (ref 10–30)

## 2015-01-21 LAB — SALICYLATE LEVEL

## 2015-01-21 LAB — ETHANOL: Alcohol, Ethyl (B): <5 mg/dL (ref 0–9)

## 2015-01-21 NOTE — ED Provider Notes (Signed)
CSN: [id number]     Arrival date & time 01/20/15  2156 History   First MD Initiated Contact with Patient 01/20/15 2246     Chief Complaint  Patient presents with  . Medical Clearance     (Consider location/radiation/quality/duration/timing/severity/associated sxs/prior Treatment) HPI Patient presents to the emergency department with worsening mentation and the patient has had outbursts of anger intermittently that has been ongoing over the last few days.  The daughter states that she feels like she needs a psychiatric evaluation.  The patient has also been having decreased oral intake and eating and she has been sleeping most of the time.  Patient was recently seen in the emergency department for difficulty walking.  Patient is unable to give any accurate history.  She does not answer my questions. Past Medical History  Diagnosis Date  . Hypertension   . Insomnia   . Anxiety   . Anemia   . Multiple myeloma     In remission  . Diverticulosis 04/2001  . Perforation bowel   . Ringing in ears     Wears hearing aides to drown out   . Rectal bleeding 02/03/2014   Past Surgical History  Procedure Laterality Date  . Sigmoid resection / rectopexy  01/2011    perforation/stoma  . Colonoscopy    . Partial hysterectomy    . Cesarean section    . Colostomy takedown  06/27/11  . Cholecystectomy  2012    laparoscopic   Family History  Problem Relation Age of Onset  . Hypertension Other   . Prostate cancer Father   . Colitis Neg Hx   . Esophageal cancer Neg Hx   . Stomach cancer Neg Hx    History  Substance Use Topics  . Smoking status: Never Smoker   . Smokeless tobacco: Never Used  . Alcohol Use: No   OB History    No data available     Review of Systems Level V caveat applies due to altered mental status   Allergies  Amiodarone; Amoxicillin; Aspirin; Atenolol; and Nortriptyline  Home Medications   Prior to Admission medications   Medication Sig Start Date End Date  Taking? Authorizing Provider  amLODipine (NORVASC) 5 MG tablet TAKES 0.5 TABLET (2.5 MG TOTAL) BY MOUTH DAILY. 12/31/14  Yes Venetia Maxon Rama, MD  ergocalciferol (VITAMIN D2) 50000 UNITS capsule Take 1 capsule (50,000 Units total) by mouth once a week. 01/05/15  Yes Aleksei Plotnikov V, MD  LORazepam (ATIVAN) 1 MG tablet Take 1 tablet (1 mg total) by mouth every 8 (eight) hours as needed for anxiety. 12/31/14  Yes Venetia Maxon Rama, MD  OVER THE COUNTER MEDICATION Take 2 capsules by mouth daily. Potassium supplement   Yes Historical Provider, MD  sertraline (ZOLOFT) 25 MG tablet Take 1 tablet (25 mg total) by mouth daily. 01/17/15  Yes Hendricks Limes, MD  dicyclomine (BENTYL) 10 MG capsule Take 1 capsule (10 mg total) by mouth 4 (four) times daily -  before meals and at bedtime. TAKE 1 CAPSULE (10 MG TOTAL) BY MOUTH 2 (TWO) TIMES DAILY.  AS NEEDED Patient not taking: Reported on 01/20/2015 10/02/14   Tyrone Apple Plotnikov V, MD  donepezil (ARICEPT) 5 MG tablet Take 1 tablet (5 mg total) by mouth at bedtime. Patient not taking: Reported on 01/20/2015 10/02/14   Aleksei Plotnikov V, MD   BP 152/72 mmHg  Pulse 85  Temp(Src) 97.9 F (36.6 C) (Oral)  Resp 14  SpO2 97% Physical Exam  Constitutional: She appears  well-developed and well-nourished. No distress.  HENT:  Head: Normocephalic and atraumatic.  Mouth/Throat: Oropharynx is clear and moist.  Eyes: Pupils are equal, round, and reactive to light.  Neck: Normal range of motion. Neck supple.  Cardiovascular: Normal rate, regular rhythm and normal heart sounds.  Exam reveals no gallop and no friction rub.   No murmur heard. Pulmonary/Chest: Effort normal and breath sounds normal. No respiratory distress.  Abdominal: Soft. Bowel sounds are normal. She exhibits no distension. There is no tenderness.  Neurological: She is alert. She exhibits normal muscle tone. Coordination normal.  Skin: Skin is warm and dry. No rash noted. No erythema.  Nursing note and  vitals reviewed.   ED Course  Procedures (including critical care time) Labs Review Labs Reviewed  ACETAMINOPHEN LEVEL - Abnormal; Notable for the following:    Acetaminophen (Tylenol), Serum <10.0 (*)    All other components within normal limits  CBC - Abnormal; Notable for the following:    WBC 3.9 (*)    RBC 2.94 (*)    Hemoglobin 8.4 (*)    HCT 27.8 (*)    RDW 16.3 (*)    All other components within normal limits  URINE CULTURE  ETHANOL  SALICYLATE LEVEL  COMPREHENSIVE METABOLIC PANEL  URINE RAPID DRUG SCREEN (HOSP PERFORMED)  URINALYSIS, ROUTINE W REFLEX MICROSCOPIC     The patient has been stable here in the emergency department.  Will wait TTS  Dalia Heading, PA-C 01/22/15 1747  April Palumbo, MD 01/22/15 [phone]

## 2015-01-21 NOTE — Discharge Instructions (Signed)
Depression Depression refers to feeling sad, low, down in the dumps, blue, gloomy, or empty. In general, there are two kinds of depression: 1. Normal sadness or normal grief. This kind of depression is one that we all feel from time to time after upsetting life experiences, such as the loss of a job or the ending of a relationship. This kind of depression is considered normal, is short lived, and resolves within a few days to 2 weeks. Depression experienced after the loss of a loved one (bereavement) often lasts longer than 2 weeks but normally gets better with time. 2. Clinical depression. This kind of depression lasts longer than normal sadness or normal grief or interferes with your ability to function at home, at work, and in school. It also interferes with your personal relationships. It affects almost every aspect of your life. Clinical depression is an illness. Symptoms of depression can also be caused by conditions other than those mentioned above, such as:  Physical illness. Some physical illnesses, including underactive thyroid gland (hypothyroidism), severe anemia, specific types of cancer, diabetes, uncontrolled seizures, heart and lung problems, strokes, and chronic pain are commonly associated with symptoms of depression.  Side effects of some prescription medicine. In some people, certain types of medicine can cause symptoms of depression.  Substance abuse. Abuse of alcohol and illicit drugs can cause symptoms of depression. SYMPTOMS Symptoms of normal sadness and normal grief include the following:  Feeling sad or crying for short periods of time.  Not caring about anything (apathy).  Difficulty sleeping or sleeping too much.  No longer able to enjoy the things you used to enjoy.  Desire to be by oneself all the time (social isolation).  Lack of energy or motivation.  Difficulty concentrating or remembering.  Change in appetite or weight.  Restlessness or  agitation. Symptoms of clinical depression include the same symptoms of normal sadness or normal grief and also the following symptoms:  Feeling sad or crying all the time.  Feelings of guilt or worthlessness.  Feelings of hopelessness or helplessness.  Thoughts of suicide or the desire to harm yourself (suicidal ideation).  Loss of touch with reality (psychotic symptoms). Seeing or hearing things that are not real (hallucinations) or having false beliefs about your life or the people around you (delusions and paranoia). DIAGNOSIS  The diagnosis of clinical depression is usually based on how bad the symptoms are and how long they have lasted. Your health care provider will also ask you questions about your medical history and substance use to find out if physical illness, use of prescription medicine, or substance abuse is causing your depression. Your health care provider may also order blood tests. TREATMENT  Often, normal sadness and normal grief do not require treatment. However, sometimes antidepressant medicine is given for bereavement to ease the depressive symptoms until they resolve. The treatment for clinical depression depends on how bad the symptoms are but often includes antidepressant medicine, counseling with a mental health professional, or both. Your health care provider will help to determine what treatment is best for you. Depression caused by physical illness usually goes away with appropriate medical treatment of the illness. If prescription medicine is causing depression, talk with your health care provider about stopping the medicine, decreasing the dose, or changing to another medicine. Depression caused by the abuse of alcohol or illicit drugs goes away when you stop using these substances. Some adults need professional help in order to stop drinking or using drugs. [place]  CARE IF:  You have thoughts about hurting yourself or others.  You lose touch  with reality (have psychotic symptoms).  You are taking medicine for depression and have a serious side effect. FOR MORE INFORMATION  National Alliance on Mental Illness: www.nami.[employer] of Mental Health: https://carter.com/ Document Released: 09/12/2000 Document Revised: 01/30/2014 Document Reviewed: 12/15/2011 [facility] Patient Information 2015 [place], [place]. This information is not intended to replace advice given to you by your health care provider. Make sure you discuss any questions you have with your health care provider.  Emergency Department Resource Guide Behavioral Health Resources in the Community: Intensive Outpatient Programs Organization         Address  Phone  Notes  [place]. [address] [place], [place] [phone]   [facility] Outpatient [address] [place], [place]   ADS: [facility] [address] [place], [place]   [place] [address],  [place], [place] or [phone]   Substance Abuse Resources Organization         Address  Phone  Notes  [facility]  [phone]   [place]  [phone]   [facility] [place]   Chinita Pester  [phone]   Residential & Outpatient Substance Abuse Program  [phone]   Psychological Services Organization         Address  Phone  Notes  [facility] [place]  [place]  [phone]   [place] [address], [place] or [phone]    Mobile Crisis Teams Organization         Address  Phone  Notes  [facility], Mobile Crisis Care Unit  [phone]   [facility]  [address] [place], [place]   [place] [address], [place] [phone]    Self-Help/Support Groups Organization          Address  Phone             Notes  [place] [place] - variety of support groups  [place] Call for more information  Narcotics Anonymous (NA), [facility] [address], [employer] [place]  2 meetings at this location   Special educational needs teacher         Address  Phone  Notes  [facility] [place],    [place]  [phone]   [facility]  [address] [place] [id number], [place], [place]   [place], [place] [phone] Admissions: 8am-3pm M-F  Incentives Substance [place] [address]    [place], [place] [phone]   [facility] [address] [place], [place], [place]   [facility] [address]  [place], [place]   [facility] - Intensive Outpatient [address], [place], [place]   [facility] [place]  [place], [place] [phone] or [phone]   [facility] ([facility]) [address] [place], [place] Accepts Medicaid  [facility] [place] [address],  [place] [phone] Substance Abuse/Addiction Treatment   [facility] Organization         Address  Phone  Notes  [facility]  [phone]   Domenic Schwab, PhD [phone]  [address], [place], [place]   [phone] or [phone]   [facility]   [address] [place], [place] [phone]   [address], [place], [place] [phone] Insurance/Medicaid/sponsorship through [facility] and Families [address], [address] [place]                                    [place], [place] [phone] [place] [address], [place] [phone]    Dr. Adele Schilder  [phone]   [facility] Dept. 1) [address] [place] 2) [place] 3)  [place] [address], [place] [phone]  [phone]   [place] [phone] or [phone] (After Hours)

## 2015-01-21 NOTE — BH Assessment (Addendum)
Tele Assessment Note   Victoria [name] is an 81 y.o. female who was brought to the [place] based on her own request to her daughter.  She has had a recent doctor visit and per her daughter "nothing was wrong."  Pt was a poor historian due to refusing to answer questions for this assessment.  Daughter Ebony Hail provided most of the information for this assessment.   Per daughter, earlier today while driving in the car with her daughter, pt asked to see the doctor.  Daughter stated that when they arrived home, mother would not get out of the car for 1 hour so, daughter decided to bring her to the hospital. Daughter said she explained to her mother that doctor's offices are not opened on Saturday but, she explained that she could bring her to the hospital.  Daughter stated that her mother did not give any information as to why she wanted to see the doctor but said to take her to the hospital.  Daughter took her to [facility] and they found out there was a 2 hour wait and mother wanted to leave.  Daughter stated that mother continued to asked to be brought to the hospital.  Per daughter, on two other occasions, daughter brought mother to [facility] today per her request.  On first trip to [facility] mother refused to exit the car.  On second, mother came in but mother has been irritable and argumentative to staff still not stating why she wanted to be seen.  Daughter stated that mother has been somewhat disoriented all day, for example, calling for her son as if he is in the next room but when asked, acknowledging that he lives in [place] and is not here now.  Daughter describes her mother as "easy going but with a stubborn streak."  Today, pt is argumentative, rude and non-compliant with requests.  Pt denied strongly SI, HI, SH urges or AVH.  Pt stated she had never attempted to harm herself or anyone else. Pt was not observed to responding to internal stimuli and was able to respond when she chose to questions and  requests. Pt did make a few comments that indicate paranoia.  Pt stated that "you all got me over here illegally" and "I just wanted a doctor to evaluate me so I can go home" before she got to the hospital which indicate paranoia and confusion. Pt does have some symptoms of depression and anxiety currently.   Pt has a hx of multiple myeloma (now in remission per daughter), Dementia with behavior disturbance and hypertension. Per Hx, pt has memory impairment.  Daughter stated that only previous mental health diagnosis is anxiety.  Daughter states that mother is accustomed to having someone with her most of the time and now is more and more afraid of being alone at any time. Per daughter, pt was IP in the hospital 12/30/14 overnight for Ataxia.  Daughter stated that she felt that was a stressor for her mother as pt has "not been right since."  Pt was was lying in her hospital bed during the assessment.  Pt did not open her eyes at all. Pt moved continually during assessment as if restless.  Pt spoke in soft, low volume tone and was able to answer basic questions about her life.  Pt would often refuse to answer.  Pt's thought processes seemed coherent and relevant to our conversation.  Pt's mood was irritable, anxious and depressed.  Pt's anxious, irritable affect was congruent.  Pt was oriented x 3, not to time.   Axis I: 311 Unspecified Depressive Disorder; 300.00 Unspecified Anxiety Disorder; Axis II: Deferred Axis III:  Past Medical History  Diagnosis Date  . Hypertension   . Insomnia   . Anxiety   . Anemia   . Multiple myeloma     In remission  . Diverticulosis 04/2001  . Perforation bowel   . Ringing in ears     Wears hearing aides to drown out   . Rectal bleeding 02/03/2014   Axis IV: other psychosocial or environmental problems, problems related to social environment, problems with access to health care services and problems with primary support group Axis V: 51-60 moderate symptoms  Past  Medical History:  Past Medical History  Diagnosis Date  . Hypertension   . Insomnia   . Anxiety   . Anemia   . Multiple myeloma     In remission  . Diverticulosis 04/2001  . Perforation bowel   . Ringing in ears     Wears hearing aides to drown out   . Rectal bleeding 02/03/2014    Past Surgical History  Procedure Laterality Date  . Sigmoid resection / rectopexy  01/2011    perforation/stoma  . Colonoscopy    . Partial hysterectomy    . Cesarean section    . Colostomy takedown  06/27/11  . Cholecystectomy  2012    laparoscopic    Family History:  Family History  Problem Relation Age of Onset  . Hypertension Other   . Prostate cancer Father   . Colitis Neg Hx   . Esophageal cancer Neg Hx   . Stomach cancer Neg Hx     Social History:  reports that she has never smoked. She has never used smokeless tobacco. She reports that she does not drink alcohol or use illicit drugs.  Additional Social History:  Alcohol / Drug Use Prescriptions: See PTA list History of alcohol / drug use?: No history of alcohol / drug abuse (Per daughter no use)  CIWA: CIWA-Ar BP: 152/72 mmHg Pulse Rate: 85 COWS:    PATIENT STRENGTHS: (choose at least two) Average or above average intelligence Communication skills Supportive family/friends  Allergies:  Allergies  Allergen Reactions  . Amiodarone     rash  . Amoxicillin Hives and Itching  . Aspirin   . Atenolol     Hair loss  . Nortriptyline     agitation    Home Medications:  (Not in a hospital admission)  OB/GYN Status:  No LMP recorded. Patient has had a hysterectomy.  General Assessment Data Location of Assessment: [facility] Is this a Tele or Face-to-Face Assessment?: Tele Assessment Is this an Initial Assessment or a Re-assessment for this encounter?: Initial Assessment Living Arrangements: Children (daughter lives with her) Can pt return to current living arrangement?: Yes Admission Status: Voluntary Is patient capable of  signing voluntary admission?: Yes Transfer from: Home Referral Source: Self/Family/Friend  Medical Screening Exam ([place]) Medical Exam completed: No Reason for MSE not completed: Other: (labs incomplete)  [place] Living Arrangements: Children (daughter lives with her) Name of Psychiatrist: none Name of Therapist: none  Education Status Is patient currently in school?: No Current Grade: na Highest grade of school patient has completed: na Name of school: na Contact person: na  Risk to self with the past 6 months Suicidal Ideation: No Suicidal Intent: No Is patient at risk for suicide?: No Suicidal Plan?: No Access to Means: No What  has been your use of drugs/alcohol within the last 12 months?: none Previous Attempts/Gestures: No How many times?: 0 Other Self Harm Risks: 0 Triggers for Past Attempts:  (none) Intentional Self Injurious Behavior: None Family Suicide History: No Recent stressful life event(s):  (Hospitalization on 12/30/14 was stressful per daughter) Persecutory voices/beliefs?: No Depression: Yes Depression Symptoms: Tearfulness, Fatigue, Loss of interest in usual pleasures, Feeling angry/irritable Substance abuse history and/or treatment for substance abuse?: No Suicide prevention information given to non-admitted patients: Not applicable  Risk to Others within the past 6 months Homicidal Ideation: No Thoughts of Harm to Others: No Current Homicidal Intent: No Current Homicidal Plan: No Access to Homicidal Means: No Identified Victim: na History of harm to others?: No Assessment of Violence: None Noted Violent Behavior Description: na Does patient have access to weapons?: No Criminal Charges Pending?: No Does patient have a court date: No  Psychosis Hallucinations: None noted Delusions: None noted  Mental Status Report Appearance/Hygiene: Disheveled, In hospital gown, Unremarkable Eye Contact: Poor (kept her eyes  shut) Motor Activity: Restlessness, Unremarkable Speech: Soft, Slow, Logical/coherent, Argumentative (would not answer questions; saying she wants to go home) Level of Consciousness: Quiet/awake, Irritable Mood: Anxious, Preoccupied, Irritable Affect: Anxious, Depressed, Irritable Anxiety Level: Moderate Thought Processes: Coherent, Relevant Judgement: Partial Orientation: Person, Place, Time, Situation Obsessive Compulsive Thoughts/Behaviors: Unable to Assess  Cognitive Functioning Concentration: Poor Memory: Unable to Assess IQ: Average Insight: Unable to Assess Impulse Control: Unable to Assess Appetite: Fair Weight Loss: 0 Weight Gain: 0 Sleep: Decreased Total Hours of Sleep:  (alternates between full nights and interrupted 4-5 times) Vegetative Symptoms: Unable to Assess  ADLScreening [facility] Assessment Services) Patient's cognitive ability adequate to safely complete daily activities?: Yes Patient able to express need for assistance with ADLs?: Yes Independently performs ADLs?: No  Prior Inpatient Therapy Prior Inpatient Therapy: Yes Prior Therapy Dates: 12/30/14 Prior Therapy Facilty/Provider(s): [facility]  Prior Outpatient Therapy Prior Outpatient Therapy: No Prior Therapy Dates: na Prior Therapy Facilty/Provider(s): na Reason for Treatment: na  ADL Screening (condition at time of admission) Patient's cognitive ability adequate to safely complete daily activities?: Yes Patient able to express need for assistance with ADLs?: Yes Independently performs ADLs?: No       Abuse/Neglect Assessment (Assessment to be complete while patient is alone) Physical Abuse:  (UTA) Verbal Abuse:  (UTA) Sexual Abuse:  (UTA pt would not answer)     Advance Directives (For Healthcare) Does patient have an advance directive?: No Would patient like information on creating an advanced directive?: No - patient declined information    Additional Information 1:1 In Past 12 Months?:  No CIRT Risk: No Elopement Risk: No Does patient have medical clearance?: No     Disposition:  Disposition Initial Assessment Completed for this Encounter: Yes Disposition of Patient: Other dispositions (Pending review w [facility]) Other disposition(s): Other (Comment)  Per Darlyne Russian, PA: Pt does not meet IP criteria for mental health admission.  Recommendation discharge with resources for OPT and recommend follow-up with her Primary Physician as soon as possible for symptom management.   Spoke with Lavonna Monarch, PA at [facility]:  Advised of recommendation.  She agreed.  Advised I would fax OP Resource list to [phone] for inclusion with discharge. Tried to call nurse to advise 4 times without success.   Faylene Kurtz, MS, CRC, [place] Triage Specialist [facility] T 01/21/2015 1:49 AM

## 2015-01-21 NOTE — ED Provider Notes (Signed)
92 - Spoke with [facility] of TTS. Patient does not meet inpatient criteria. They feel that she is appropriate for outpatient management and PCP f/u. Resource guide provided. Patient hemodynamically stable over ED course; discharged in good condition. Return precautions provided.  Antonietta Breach, PA-C 01/21/15 [id number]  April Palumbo, MD 01/21/15 [phone]

## 2015-01-21 NOTE — ED Notes (Signed)
TTS on going. 

## 2015-01-22 LAB — URINE CULTURE
Colony Count: NO GROWTH
Culture: NO GROWTH

## 2015-01-23 ENCOUNTER — Telehealth: Payer: Self-pay | Admitting: Internal Medicine

## 2015-01-23 ENCOUNTER — Telehealth: Payer: Self-pay | Admitting: *Deleted

## 2015-01-23 ENCOUNTER — Telehealth: Payer: Self-pay | Admitting: Hematology and Oncology

## 2015-01-23 DIAGNOSIS — R413 Other amnesia: Secondary | ICD-10-CM

## 2015-01-23 NOTE — Telephone Encounter (Signed)
Please advise, thanks.

## 2015-01-23 NOTE — Telephone Encounter (Signed)
returned call and s.w. pt daughter and she expressed taht pt pcp was concerned about her blood level and she needed to be seen transferred to triage

## 2015-01-23 NOTE — Telephone Encounter (Signed)
Patient family member called concerned about patient Hgb:8.4. Patient was referred from her PCP to oncologist due to her lab results. Patient is currently feeling fatigued and SOB. Family member would like her to be seen on Thursday if possible for labs and possible blood transfusion. Message sent to MD and Nurse.

## 2015-01-23 NOTE — Telephone Encounter (Signed)
Daughter called in and wanted to know if pt can get a referral to  Neurology?   Best number [phone]

## 2015-01-23 NOTE — Telephone Encounter (Signed)
OK. Thx

## 2015-01-24 ENCOUNTER — Other Ambulatory Visit: Payer: Medicare Other

## 2015-01-24 ENCOUNTER — Other Ambulatory Visit: Payer: Self-pay | Admitting: Hematology and Oncology

## 2015-01-24 ENCOUNTER — Encounter: Payer: Self-pay | Admitting: Hematology and Oncology

## 2015-01-24 ENCOUNTER — Encounter: Payer: Self-pay | Admitting: *Deleted

## 2015-01-24 ENCOUNTER — Telehealth: Payer: Self-pay

## 2015-01-24 ENCOUNTER — Telehealth: Payer: Self-pay | Admitting: Hematology and Oncology

## 2015-01-24 DIAGNOSIS — C9001 Multiple myeloma in remission: Secondary | ICD-10-CM

## 2015-01-24 DIAGNOSIS — D61818 Other pancytopenia: Secondary | ICD-10-CM | POA: Insufficient documentation

## 2015-01-24 DIAGNOSIS — D638 Anemia in other chronic diseases classified elsewhere: Secondary | ICD-10-CM

## 2015-01-24 HISTORY — DX: Anemia in other chronic diseases classified elsewhere: D63.8

## 2015-01-24 NOTE — Telephone Encounter (Signed)
Called and left message advising that referral for neurology has been sent, and that dept should be calling for appt

## 2015-01-24 NOTE — Telephone Encounter (Signed)
Victoria [name] called stating she will have to r/s lab appt to tomorrow about 230 or 3 pm. Missed today's appt. POF sent

## 2015-01-24 NOTE — Telephone Encounter (Signed)
Left message with Dr Alvy Bimler note below

## 2015-01-24 NOTE — Telephone Encounter (Signed)
I placed POF for labs today, XRAY today and see me next week Please let her know the labs I needed will take 1 week for results to come back Do not leave until we confirmed her hemoglobin is not <8 otherwise we may need transfusion. Hold tube is included in her lab order for today

## 2015-01-24 NOTE — Telephone Encounter (Signed)
lvm for pt regarding to 4.27 and 5.4 appt.Marland KitchenMarland KitchenMarland Kitchen

## 2015-01-25 ENCOUNTER — Other Ambulatory Visit (HOSPITAL_COMMUNITY)
Admission: RE | Admit: 2015-01-25 | Discharge: 2015-01-25 | Disposition: A | Discharge status: Home / Self Care | Payer: Medicare Other | Source: Ambulatory Visit | Attending: Internal Medicine | Admitting: Internal Medicine

## 2015-01-25 ENCOUNTER — Other Ambulatory Visit (HOSPITAL_BASED_OUTPATIENT_CLINIC_OR_DEPARTMENT_OTHER): Payer: Medicare Other

## 2015-01-25 DIAGNOSIS — R270 Ataxia, unspecified: Secondary | ICD-10-CM | POA: Insufficient documentation

## 2015-01-25 DIAGNOSIS — D72819 Decreased white blood cell count, unspecified: Secondary | ICD-10-CM

## 2015-01-25 DIAGNOSIS — C9000 Multiple myeloma not having achieved remission: Secondary | ICD-10-CM

## 2015-01-25 DIAGNOSIS — D638 Anemia in other chronic diseases classified elsewhere: Secondary | ICD-10-CM

## 2015-01-25 DIAGNOSIS — D630 Anemia in neoplastic disease: Secondary | ICD-10-CM | POA: Diagnosis not present

## 2015-01-25 DIAGNOSIS — C9001 Multiple myeloma in remission: Secondary | ICD-10-CM

## 2015-01-25 LAB — CBC & DIFF AND RETIC
BASO%: 0.7 % (ref 0.0–2.0)
Basophils Absolute: 0 10*3/uL (ref 0.0–0.1)
EOS ABS: 0 10*3/uL (ref 0.0–0.5)
EOS%: 0.3 % (ref 0.0–7.0)
HEMATOCRIT: 27.7 % — AB (ref 34.8–46.6)
HEMOGLOBIN: 8.5 g/dL — AB (ref 11.6–15.9)
Immature Retic Fract: 17.9 % — ABNORMAL HIGH (ref 1.60–10.00)
LYMPH%: 15.9 % (ref 14.0–49.7)
MCH: 28.9 pg (ref 25.1–34.0)
MCHC: 30.7 g/dL — ABNORMAL LOW (ref 31.5–36.0)
MCV: 94.2 fL (ref 79.5–101.0)
MONO#: 0.3 10*3/uL (ref 0.1–0.9)
MONO%: 9.7 % (ref 0.0–14.0)
NEUT%: 73.4 % (ref 38.4–76.8)
NEUTROS ABS: 2.1 10*3/uL (ref 1.5–6.5)
PLATELETS: 322 10*3/uL (ref 145–400)
RBC: 2.94 10*6/uL — ABNORMAL LOW (ref 3.70–5.45)
RDW: 16.8 % — ABNORMAL HIGH (ref 11.2–14.5)
Retic %: 2.14 % — ABNORMAL HIGH (ref 0.70–2.10)
Retic Ct Abs: 62.92 10*3/uL (ref 33.70–90.70)
WBC: 2.9 10*3/uL — ABNORMAL LOW (ref 3.9–10.3)
lymph#: 0.5 10*3/uL — ABNORMAL LOW (ref 0.9–3.3)

## 2015-01-25 LAB — COMPREHENSIVE METABOLIC PANEL (CC13)
ALT: 9 U/L (ref 0–55)
AST: 16 U/L (ref 5–34)
Albumin: 2.6 g/dL — ABNORMAL LOW (ref 3.5–5.0)
Alkaline Phosphatase: 179 U/L — ABNORMAL HIGH (ref 40–150)
Anion Gap: 11 mEq/L (ref 3–11)
BUN: 16 mg/dL (ref 7.0–26.0)
CO2: 25 mEq/L (ref 22–29)
CREATININE: 0.8 mg/dL (ref 0.6–1.1)
Calcium: 10.1 mg/dL (ref 8.4–10.4)
Chloride: 108 mEq/L (ref 98–109)
EGFR: 80 mL/min/{1.73_m2} — ABNORMAL LOW (ref >90)
Glucose: 95 mg/dl (ref 70–140)
Potassium: 4 mEq/L (ref 3.5–5.1)
SODIUM: 144 meq/L (ref 136–145)
Total Bilirubin: 0.64 mg/dL (ref 0.20–1.20)
Total Protein: 9.8 g/dL — ABNORMAL HIGH (ref 6.4–8.3)

## 2015-01-25 LAB — HOLD TUBE, BLOOD BANK

## 2015-01-25 LAB — SAMPLE TO BLOOD BANK

## 2015-01-25 LAB — LACTATE DEHYDROGENASE (CC13): LDH: 174 U/L (ref 125–245)

## 2015-01-26 ENCOUNTER — Telehealth: Payer: Self-pay

## 2015-01-26 ENCOUNTER — Telehealth: Payer: Self-pay | Admitting: *Deleted

## 2015-01-26 ENCOUNTER — Other Ambulatory Visit: Payer: Self-pay | Admitting: Hematology and Oncology

## 2015-01-26 LAB — FERRITIN CHCC: FERRITIN: 385 ng/mL — AB (ref 9–269)

## 2015-01-26 LAB — IRON AND TIBC CHCC
%SAT: 22 % (ref 21–57)
IRON: 41 ug/dL (ref 41–142)
TIBC: 183 ug/dL — ABNORMAL LOW (ref 236–444)
UIBC: 142 ug/dL (ref 120–384)

## 2015-01-26 NOTE — Telephone Encounter (Signed)
Reminded daughter to have XRAY before she sees Dr Alvy Bimler on Wednesday

## 2015-01-26 NOTE — Telephone Encounter (Addendum)
Daughter called concerned that her mother is extremely fatigued. They had to use a wheelchair yesterday for lab appt, this is not usual. The pt can barely stay awake. Her Hgb on 4/28 was 8.5. She is asking if a blood transfusion is an option? Her mother did keep the blue blood bank band on.

## 2015-01-26 NOTE — Telephone Encounter (Signed)
Gave Victoria [name] Dr Calton Dach reply. Next appt with Dr Alvy Bimler is 5/4.

## 2015-01-26 NOTE — Telephone Encounter (Signed)
-----   Message from Heath Lark, MD sent at 01/26/2015  7:49 AM EDT ----- Regarding: skeletal survey She did not go to have her XRAY done. She can do it as a walk in next week before her appt to see me. Please remind her. It is ordered to be done at [facility]

## 2015-01-26 NOTE — Telephone Encounter (Signed)
Current guidelines only allow transfusion for hemoglobin <8. She can remove her band.

## 2015-01-27 DIAGNOSIS — R262 Difficulty in walking, not elsewhere classified: Secondary | ICD-10-CM | POA: Diagnosis not present

## 2015-01-29 ENCOUNTER — Encounter: Payer: Self-pay | Admitting: *Deleted

## 2015-01-29 ENCOUNTER — Ambulatory Visit (HOSPITAL_COMMUNITY)
Admission: RE | Admit: 2015-01-29 | Discharge: 2015-01-29 | Disposition: A | Discharge status: Home / Self Care | Payer: Medicare Other | Source: Ambulatory Visit | Attending: Hematology and Oncology | Admitting: Hematology and Oncology

## 2015-01-29 DIAGNOSIS — C9000 Multiple myeloma not having achieved remission: Secondary | ICD-10-CM | POA: Insufficient documentation

## 2015-01-29 LAB — KAPPA/LAMBDA LIGHT CHAINS
KAPPA LAMBDA RATIO: 0.01 — AB (ref 0.26–1.65)
Kappa free light chain: 2.69 mg/dL — ABNORMAL HIGH (ref 0.33–1.94)
Lambda Free Lght Chn: 311 mg/dL — ABNORMAL HIGH (ref 0.57–2.63)

## 2015-01-29 LAB — SPEP & IFE WITH QIG
ALPHA-1-GLOBULIN: 0.5 g/dL — AB (ref 0.2–0.3)
ALPHA-2-GLOBULIN: 1.1 g/dL — AB (ref 0.5–0.9)
Abnormal Protein Band1: 3.1 g/dL
Albumin ELP: 2.9 g/dL — ABNORMAL LOW (ref 3.8–4.8)
BETA 2: 0.5 g/dL (ref 0.2–0.5)
BETA GLOBULIN: 0.3 g/dL — AB (ref 0.4–0.6)
GAMMA GLOBULIN: 3.4 g/dL — AB (ref 0.8–1.7)
IGG (IMMUNOGLOBIN G), SERUM: 3890 mg/dL — AB (ref 690–1700)
IGM, SERUM: 12 mg/dL — AB (ref 52–322)
IgA: 192 mg/dL (ref 69–380)
Total Protein, Serum Electrophoresis: 8.7 g/dL — ABNORMAL HIGH (ref 6.1–8.1)

## 2015-01-29 LAB — VITAMIN B12: Vitamin B-12: >2000 pg/mL — ABNORMAL HIGH (ref 211–911)

## 2015-01-29 LAB — SEDIMENTATION RATE: SED RATE: 145 mm/h — AB (ref 0–30)

## 2015-01-29 LAB — ERYTHROPOIETIN: Erythropoietin: 52.7 m[IU]/mL — ABNORMAL HIGH (ref 2.6–18.5)

## 2015-01-29 NOTE — Telephone Encounter (Signed)
Victoria [name] called from [facility] radiology asking why she was told to come today for CXR before visit on Wednesday and the radiology department doesn't have orders.  Consulted with Tammi, patient is for skeletal survey.  Cameo is the collabotive nurse today and reports she has already spoken with [facility] radiology department.

## 2015-01-30 ENCOUNTER — Telehealth: Payer: Self-pay | Admitting: *Deleted

## 2015-01-30 ENCOUNTER — Other Ambulatory Visit: Payer: Medicare Other

## 2015-01-30 NOTE — Telephone Encounter (Signed)
Left VM for pt's daughter instructing for pt to come at 10:15 am tomorrow for Lab/MD and then scheduled for BMBx after MD visit.  Informed that Dr Alvy Bimler will explain more on office visit but to call us back if any questions about appts tomorrow.

## 2015-01-30 NOTE — Telephone Encounter (Signed)
Daughter confirmed appts tomorrow.  She states she doubts pt will agree to do unsedated BMBx but they will discuss w/ Dr. Alvy Bimler tomorrow.

## 2015-01-30 NOTE — Telephone Encounter (Signed)
-----   Message from Heath Lark, MD sent at 01/30/2015  8:28 AM EDT ----- Regarding: need BM biopsy Can you call her and family that I will plan to do BM biopsy on her tomorrow at [facility]? I will explain more when I see her. We need to check to see if they can get a bed around 12 pm and see if flow is available.

## 2015-01-31 ENCOUNTER — Other Ambulatory Visit: Payer: Medicare Other

## 2015-01-31 ENCOUNTER — Telehealth: Payer: Self-pay | Admitting: Hematology and Oncology

## 2015-01-31 ENCOUNTER — Ambulatory Visit (HOSPITAL_BASED_OUTPATIENT_CLINIC_OR_DEPARTMENT_OTHER): Payer: Medicare Other | Admitting: *Deleted

## 2015-01-31 ENCOUNTER — Encounter: Payer: Self-pay | Admitting: Hematology and Oncology

## 2015-01-31 ENCOUNTER — Other Ambulatory Visit: Payer: Self-pay | Admitting: Hematology and Oncology

## 2015-01-31 ENCOUNTER — Ambulatory Visit (HOSPITAL_BASED_OUTPATIENT_CLINIC_OR_DEPARTMENT_OTHER): Payer: Medicare Other | Admitting: Hematology and Oncology

## 2015-01-31 VITALS — BP 120/50

## 2015-01-31 VITALS — BP 119/47 | HR 70 | Temp 98.0°F | Resp 16 | Ht 61.0 in | Wt 95.8 lb

## 2015-01-31 DIAGNOSIS — D72818 Other decreased white blood cell count: Secondary | ICD-10-CM | POA: Diagnosis not present

## 2015-01-31 DIAGNOSIS — R410 Disorientation, unspecified: Secondary | ICD-10-CM

## 2015-01-31 DIAGNOSIS — C9002 Multiple myeloma in relapse: Secondary | ICD-10-CM

## 2015-01-31 DIAGNOSIS — D630 Anemia in neoplastic disease: Secondary | ICD-10-CM

## 2015-01-31 DIAGNOSIS — D72819 Decreased white blood cell count, unspecified: Secondary | ICD-10-CM

## 2015-01-31 DIAGNOSIS — C9001 Multiple myeloma in remission: Secondary | ICD-10-CM

## 2015-01-31 MED ORDER — SODIUM CHLORIDE 0.9 % IV SOLN
10.0000 mg | Freq: Once | INTRAVENOUS | Status: AC
  Administered 2015-01-31: 10 mg via INTRAVENOUS
  Filled 2015-01-31: qty 1

## 2015-01-31 MED ORDER — SODIUM CHLORIDE 0.9 % IV SOLN: Freq: Once | INTRAVENOUS | Status: DC

## 2015-01-31 MED ORDER — IXAZOMIB CITRATE 4 MG PO CAPS: 4.0000 mg | ORAL_CAPSULE | ORAL | Status: DC

## 2015-01-31 MED ORDER — LENALIDOMIDE 5 MG PO CAPS: 5.0000 mg | ORAL_CAPSULE | Freq: Every day | ORAL | Status: DC

## 2015-01-31 MED ORDER — DEXAMETHASONE 4 MG PO TABS: 20.0000 mg | ORAL_TABLET | Freq: Every day | ORAL | Status: DC

## 2015-01-31 MED ORDER — SODIUM CHLORIDE 0.9 % IV SOLN
Freq: Once | INTRAVENOUS | Status: AC
  Administered 2015-01-31: 12:00:00 via INTRAVENOUS

## 2015-01-31 NOTE — Patient Instructions (Signed)
Dehydration, Adult Dehydration is when you lose more fluids from the body than you take in. Vital organs like the kidneys, brain, and heart cannot function without a proper amount of fluids and salt. Any loss of fluids from the body can cause dehydration.  CAUSES   Vomiting.  Diarrhea.  Excessive sweating.  Excessive urine output.  Fever. SYMPTOMS  Mild dehydration  Thirst.  Dry lips.  Slightly dry mouth. Moderate dehydration  Very dry mouth.  Sunken eyes.  Skin does not bounce back quickly when lightly pinched and released.  Dark urine and decreased urine production.  Decreased tear production.  Headache. Severe dehydration  Very dry mouth.  Extreme thirst.  Rapid, weak pulse (more than 100 beats per minute at rest).  Cold hands and feet.  Not able to sweat in spite of heat and temperature.  Rapid breathing.  Blue lips.  Confusion and lethargy.  Difficulty being awakened.  Minimal urine production.  No tears. DIAGNOSIS  Your caregiver will diagnose dehydration based on your symptoms and your exam. Blood and urine tests will help confirm the diagnosis. The diagnostic evaluation should also identify the cause of dehydration. TREATMENT  Treatment of mild or moderate dehydration can often be done at home by increasing the amount of fluids that you drink. It is best to drink small amounts of fluid more often. Drinking too much at one time can make vomiting worse. Refer to the home care instructions below. Severe dehydration needs to be treated at the hospital where you will probably be given intravenous (IV) fluids that contain water and electrolytes. HOME CARE INSTRUCTIONS   Ask your caregiver about specific rehydration instructions.  Drink enough fluids to keep your urine clear or pale yellow.  Drink small amounts frequently if you have nausea and vomiting.  Eat as you normally do.  Avoid:  Foods or drinks high in sugar.  Carbonated  drinks.  Juice.  Extremely hot or cold fluids.  Drinks with caffeine.  Fatty, greasy foods.  Alcohol.  Tobacco.  Overeating.  Gelatin desserts.  Wash your hands well to avoid spreading bacteria and viruses.  Only take over-the-counter or prescription medicines for pain, discomfort, or fever as directed by your caregiver.  Ask your caregiver if you should continue all prescribed and over-the-counter medicines.  Keep all follow-up appointments with your caregiver. SEEK MEDICAL CARE IF:  You have abdominal pain and it increases or stays in one area (localizes).  You have a rash, stiff neck, or severe headache.  You are irritable, sleepy, or difficult to awaken.  You are weak, dizzy, or extremely thirsty. SEEK IMMEDIATE MEDICAL CARE IF:   You are unable to keep fluids down or you get worse despite treatment.  You have frequent episodes of vomiting or diarrhea.  You have blood or green matter (bile) in your vomit.  You have blood in your stool or your stool looks black and tarry.  You have not urinated in 6 to 8 hours, or you have only urinated a small amount of very dark urine.  You have a fever.  You faint. MAKE SURE YOU:   Understand these instructions.  Will watch your condition.  Will get help right away if you are not doing well or get worse. Document Released: 09/15/2005 Document Revised: 12/08/2011 Document Reviewed: 05/05/2011 ExitCare Patient Information 2015 [employer]. This information is not intended to replace advice given to you by your health care provider. Make sure you discuss any questions you have with your health care   provider.  

## 2015-01-31 NOTE — Telephone Encounter (Signed)
per pof to sch pt appt-sent email to sch pt IV & zometa-adv pt will call withj5/6 & 5/9 appts-pt getting IV today

## 2015-02-01 ENCOUNTER — Encounter: Payer: Self-pay | Admitting: Hematology and Oncology

## 2015-02-01 ENCOUNTER — Telehealth: Payer: Self-pay | Admitting: *Deleted

## 2015-02-01 DIAGNOSIS — T451X5A Adverse effect of antineoplastic and immunosuppressive drugs, initial encounter: Secondary | ICD-10-CM

## 2015-02-01 DIAGNOSIS — D701 Agranulocytosis secondary to cancer chemotherapy: Secondary | ICD-10-CM | POA: Insufficient documentation

## 2015-02-01 NOTE — Assessment & Plan Note (Signed)
The patient is not able to comprehend what is going on due to her dementia. Her daughter is present and she appears to be disappointed to know that the disease has relapsed. I discussed with her the importance of staging bone marrow biopsy and to exclude myelodysplastic syndrome. She wants the procedure to be a sedated bone marrow biopsy. I will arrange that for next week. In the meantime, I am concerned about hypercalcemia of malignancy. I explained to her that this can see to the medical emergency. The patient is adamant that she does not want to be admitted for treatment of this. I suggest IV fluid hydration and high-dose dexamethasone over the next few days. I will order IV Zometa next week to treat this if this does not improve. For treatment option, the patient has difficulties with traveling back and forth the cancer center for treatment. I recommend a trial of Nin[place]o, Revlimid and dexamethasone.  The decision was made based on recent publication on NEJM. Role of treatment is palliative.  Oral Ixazomib, Lenalidomide, and Dexamethasone for Multiple Myeloma Stacie Acres, M.D., Marin Shutter, M.D., Ph.D., Coralie Keens, M.D., Ph.D., Vivianne Master, M.D., Towanda Malkin, M.D., Ph.D., Berenda Morale, M.D., Serita Grammes, M.D., Pincus Large, B.M., B.Ch., Zachary George, M.B., Ch.B., Nicholes Mango, M.B., Ch.B., Threasa Beards, M.D., Loralee Pacas. Bonne Dolores., Ch.B., Lelon Frohlich, M.D., D.M.Sci., Lucienne Capers, M.D., Kae Heller, M.D., Hope Pigeon, M.D., Rodena Goldmann, M.D., Randa Spike, M.D., Ph.D., Joellyn Rued, M.D., Edyth Gunnels. Serafina Mitchell, M.D., Apolinar Junes, R.N., M.S.N., Elita Boone, Ph.D., Rolm Baptise, Ph.D., Reatha Armour, M.D., Ph.D., [facility] Clyda Hurdle, M.D., Ph.D., and Loyal Gambler. Marvel Plan, M.D., for the [facility]* [employer] Med 2016; [phone] 28, 2016DOI: 10.1056/[id number]  Ixazomib is an oral proteasome inhibitor that is  currently being studied for the treatment of multiple myeloma  In this double-blind, placebo-controlled, phase 3 trial, we randomly assigned 722 patients who had relapsed, refractory, or relapsed and refractory multiple myeloma to receive ixazomib plus lenalidomide-dexamethasone (ixazomib group) or placebo plus lenalidomide-dexamethasone (placebo group).   Progression-free survival was significantly longer in the ixazomib group than in the placebo group at a median follow-up of 14.7 months (median progression-free survival, 20.6 months vs. 14.7 months; hazard ratio for disease progression or death in the ixazomib group, 0.74; P=0.01); a benefit with respect to progression-free survival was observed with the ixazomib regimen, as compared with the placebo regimen, in all prespecified patient subgroups, including in patients with high-risk cytogenetic abnormalities. The overall rates of response were 78% in the ixazomib group and 72% in the placebo group, and the corresponding rates of complete response plus very good partial response were 48% and 39%. The median time to response was 1.1 months in the ixazomib group and 1.9 months in the placebo group, and the corresponding median duration of response was 20.5 months and 15.0 months. At a median follow-up of approximately 23 months, the median overall survival has not been reached in either study group, and follow-up is ongoing. The rates of serious adverse events were similar in the two study groups (47% in the ixazomib group and 49% in the placebo group), as were the rates of death during the study period (4% and 6%, respectively); adverse events of at least grade 3 severity occurred in 74% and 69% of the patients, respectively. Thrombocytopenia of grade 3 and grade 4 severity occurred more frequently in the ixazomib group (12% and 7% of the patients, respectively) than in the placebo group (  5% and 4% of the patients, respectively). Rash occurred more frequently  in the ixazomib group than in the placebo group (36% vs. 23% of the patients), as did gastrointestinal adverse events, which were predominantly low grade. The incidence of peripheral neuropathy was 27% in the ixazomib group and 22% in the placebo group (grade 3 events occurred in 2% of the patients in each study group). Patient-reported quality of life was similar in the two study groups.  Some of the short term side-effects included, though not limited to, risk of fatigue, weight loss, tumor lysis syndrome, risk of allergic reactions, pancytopenia, life-threatening infections, need for transfusions of blood products, admission to hospital for various reasons, and risks of death.   The patient is aware that the response rates discussed earlier is not guaranteed.    After a long discussion, patient made an informed decision to proceed with the prescribed plan of care.

## 2015-02-01 NOTE — Assessment & Plan Note (Signed)
The cause of leukopenia is related to disease. I will start her on reduced dose Revlimid for this reason.

## 2015-02-01 NOTE — Assessment & Plan Note (Signed)
This is likely anemia of chronic disease and related to relapsed multiple myeloma. The patient denies recent history of bleeding such as epistaxis, hematuria or hematochezia. She is asymptomatic from the anemia. We will observe for now.  She does not require transfusion now.  I will transfuse 1 unit of blood whenever hemoglobin dropped to less than 8 g.     

## 2015-02-01 NOTE — Progress Notes (Signed)
[facility] OFFICE PROGRESS NOTE  Patient Care Team: Cassandria Anger, MD as PCP - General [place] Dragon, MD as Consulting Physician (Gastroenterology) Leonie Man, MD as Consulting Physician (Cardiology) Heath Lark, MD as Consulting Physician (Hematology and Oncology)  SUMMARY OF ONCOLOGIC HISTORY: She was initially diagnosed in October, 2008. Initial treatment with oral Alkeran, prednisone, and Revlimid. Revlimid stopped early on due to a dysphoric reaction. She was treated for 6 months. She reached a stable plateau and had a long treatment free interval. Last cycle given in October 2009. Secondary to rising paraprotein levels, treatment was reinitiated with oral pomalidomide in February 2014. Initial dose 2 mg 21 days on 7 day rest. She tolerated this well and was escalated to 3 mg. There was a slow but steady improvement in her paraprotein levels. Due to various reasons, pomalidomide was held since 09/02/2013.   INTERVAL HISTORY: Please see below for problem oriented charting. She is seen urgently today because of progressive anemia and confusion. She was recently admitted to the hospital in April for management of hypercalcemia. Repeat blood work and imaging studies show relapse of multiple myeloma. The patient continues with intermittent confusion, weakness and fatigue. She denies any bone pain. She has poor appetite and has lost some weight recently.  REVIEW OF SYSTEMS:   Constitutional: Denies fevers, chills  Eyes: Denies blurriness of vision Ears, nose, mouth, throat, and face: Denies mucositis or sore throat Respiratory: Denies cough, dyspnea or wheezes Cardiovascular: Denies palpitation, chest discomfort or lower extremity swelling Gastrointestinal:  Denies nausea, heartburn or change in bowel habits Skin: Denies abnormal skin rashes Lymphatics: Denies new lymphadenopathy or easy bruising Neurological:Denies numbness, tingling or new  weaknesses Behavioral/Psych: Mood is stable, no new changes  All other systems were reviewed with the patient and are negative.  I have reviewed the past medical history, past surgical history, social history and family history with the patient and they are unchanged from previous note.  ALLERGIES:  is allergic to amiodarone; amoxicillin; aspirin; atenolol; nortriptyline; and zoloft.  MEDICATIONS:  Current Outpatient Prescriptions  Medication Sig Dispense Refill  . amLODipine (NORVASC) 5 MG tablet TAKES 0.5 TABLET (2.5 MG TOTAL) BY MOUTH DAILY. 90 tablet 3  . ergocalciferol (VITAMIN D2) 50000 UNITS capsule Take 1 capsule (50,000 Units total) by mouth once a week. 6 capsule 0  . LORazepam (ATIVAN) 1 MG tablet Take 1 tablet (1 mg total) by mouth every 8 (eight) hours as needed for anxiety. (Patient taking differently: Take 0.5 mg by mouth every 8 (eight) hours as needed for anxiety. ) 60 tablet 3  . OVER THE COUNTER MEDICATION Take 2 capsules by mouth daily. Potassium supplement    . dexamethasone (DECADRON) 4 MG tablet Take 5 tablets (20 mg total) by mouth daily. 60 tablet 0  . donepezil (ARICEPT) 5 MG tablet Take 1 tablet (5 mg total) by mouth at bedtime. (Patient not taking: Reported on 01/31/2015) 30 tablet 5  . ixazomib citrate (NINLARO) 4 MG capsule Take 1 capsule (4 mg total) by mouth once a week. Take on an empty stomach 1hr before or 2hrs after food. Do not crush, chew or open. 4 capsule 0  . lenalidomide (REVLIMID) 5 MG capsule Take 1 capsule (5 mg total) by mouth daily. 21 capsule 0   No current facility-administered medications for this visit.   Facility-Administered Medications Ordered in Other Visits  Medication Dose Route Frequency Provider Last Rate Last Dose  . 0.9 %  sodium chloride infusion  Intravenous Once Heath Lark, MD        PHYSICAL EXAMINATION: ECOG PERFORMANCE STATUS: 2 - Symptomatic, <50% confined to bed  Filed Vitals:   01/31/15 1035  BP: 119/47  Pulse: 70   Temp: 98 F (36.7 C)  Resp: 16   Filed Weights   01/31/15 1035  Weight: 95 lb 12.8 oz (43.455 kg)    GENERAL:alert, no distress and comfortable. She looks thin and cachectic SKIN: skin color, texture, turgor are normal, no rashes or significant lesions EYES: normal, Conjunctiva are pink and non-injected, sclera clear OROPHARYNX:no exudate, no erythema and lips, buccal mucosa, and tongue normal  NECK: supple, thyroid normal size, non-tender, without nodularity LYMPH:  no palpable lymphadenopathy in the cervical, axillary or inguinal LUNGS: clear to auscultation and percussion with normal breathing effort HEART: regular rate & rhythm and no murmurs and no lower extremity edema ABDOMEN:abdomen soft, non-tender and normal bowel sounds Musculoskeletal:no cyanosis of digits and no clubbing  NEURO: alert & oriented x 3 with fluent speech, no focal motor/sensory deficits  LABORATORY DATA:  I have reviewed the data as listed    Component Value Date/Time   NA 144 01/25/2015 1603   NA 139 01/20/2015 2345   K 4.0 01/25/2015 1603   K 3.9 01/20/2015 2345   CL 111 01/20/2015 2345   CL 103 03/11/2013 1503   CO2 25 01/25/2015 1603   CO2 25 01/20/2015 2345   GLUCOSE 95 01/25/2015 1603   GLUCOSE 80 01/20/2015 2345   GLUCOSE 91 03/11/2013 1503   BUN 16.0 01/25/2015 1603   BUN 19 01/20/2015 2345   CREATININE 0.8 01/25/2015 1603   CREATININE 0.79 01/20/2015 2345   CALCIUM 10.1 01/25/2015 1603   CALCIUM 8.7 01/20/2015 2345   CALCIUM 11.4* 12/30/2014 0835   PROT 9.8* 01/25/2015 1603   PROT 8.9* 01/20/2015 2345   ALBUMIN 2.6* 01/25/2015 1603   ALBUMIN 2.7* 01/20/2015 2345   AST 16 01/25/2015 1603   AST 22 01/20/2015 2345   ALT 9 01/25/2015 1603   ALT 12 01/20/2015 2345   ALKPHOS 179* 01/25/2015 1603   ALKPHOS 132* 01/20/2015 2345   BILITOT 0.64 01/25/2015 1603   BILITOT 0.6 01/20/2015 2345   GFRNONAA 76* 01/20/2015 2345   GFRAA 88* 01/20/2015 2345    No results found for: SPEP,  UPEP  Lab Results  Component Value Date   WBC 2.9* 01/25/2015   NEUTROABS 2.1 01/25/2015   HGB 8.5* 01/25/2015   HCT 27.7* 01/25/2015   MCV 94.2 01/25/2015   PLT 322 01/25/2015      Chemistry      Component Value Date/Time   NA 144 01/25/2015 1603   NA 139 01/20/2015 2345   K 4.0 01/25/2015 1603   K 3.9 01/20/2015 2345   CL 111 01/20/2015 2345   CL 103 03/11/2013 1503   CO2 25 01/25/2015 1603   CO2 25 01/20/2015 2345   BUN 16.0 01/25/2015 1603   BUN 19 01/20/2015 2345   CREATININE 0.8 01/25/2015 1603   CREATININE 0.79 01/20/2015 2345      Component Value Date/Time   CALCIUM 10.1 01/25/2015 1603   CALCIUM 8.7 01/20/2015 2345   CALCIUM 11.4* 12/30/2014 0835   ALKPHOS 179* 01/25/2015 1603   ALKPHOS 132* 01/20/2015 2345   AST 16 01/25/2015 1603   AST 22 01/20/2015 2345   ALT 9 01/25/2015 1603   ALT 12 01/20/2015 2345   BILITOT 0.64 01/25/2015 1603   BILITOT 0.6 01/20/2015 2345  RADIOGRAPHIC STUDIES: I reviewed the imaging study with her and daughter I have personally reviewed the radiological images as listed and agreed with the findings in the report.  ASSESSMENT & PLAN:  Multiple myeloma in relapse The patient is not able to comprehend what is going on due to her dementia. Her daughter is present and she appears to be disappointed to know that the disease has relapsed. I discussed with her the importance of staging bone marrow biopsy and to exclude myelodysplastic syndrome. She wants the procedure to be a sedated bone marrow biopsy. I will arrange that for next week. In the meantime, I am concerned about hypercalcemia of malignancy. I explained to her that this can see to the medical emergency. The patient is adamant that she does not want to be admitted for treatment of this. I suggest IV fluid hydration and high-dose dexamethasone over the next few days. I will order IV Zometa next week to treat this if this does not improve. For treatment option, the  patient has difficulties with traveling back and forth the cancer center for treatment. I recommend a trial of Ninlaro, Revlimid and dexamethasone.  The decision was made based on recent publication on NEJM. Role of treatment is palliative.  Oral Ixazomib, Lenalidomide, and Dexamethasone for Multiple Myeloma Stacie Acres, M.D., Marin Shutter, M.D., Ph.D., Coralie Keens, M.D., Ph.D., Vivianne Master, M.D., Towanda Malkin, M.D., Ph.D., Berenda Morale, M.D., Serita Grammes, M.D., Pincus Large, B.M., B.Ch., Zachary George, M.B., Ch.B., Nicholes Mango, M.B., Ch.B., Threasa Beards, M.D., Loralee Pacas. Bonne Dolores., Ch.B., Lelon Frohlich, M.D., D.M.Sci., Lucienne Capers, M.D., Kae Heller, M.D., Hope Pigeon, M.D., Rodena Goldmann, M.D., Randa Spike, M.D., Ph.D., Joellyn Rued, M.D., Edyth Gunnels. Serafina Mitchell, M.D., Apolinar Junes, R.N., M.S.N., Elita Boone, Ph.D., Rolm Baptise, Ph.D., Reatha Armour, M.D., Ph.D., [facility] Clyda Hurdle, M.D., Ph.D., and Loyal Gambler. Marvel Plan, M.D., for the [facility]* [facility] 2016; [id number], 2016DOI: 10.1056/[id number]  Ixazomib is an oral proteasome inhibitor that is currently being studied for the treatment of multiple myeloma  In this double-blind, placebo-controlled, phase 3 trial, we randomly assigned 722 patients who had relapsed, refractory, or relapsed and refractory multiple myeloma to receive ixazomib plus lenalidomide-dexamethasone (ixazomib group) or placebo plus lenalidomide-dexamethasone (placebo group).   Progression-free survival was significantly longer in the ixazomib group than in the placebo group at a median follow-up of 14.7 months (median progression-free survival, 20.6 months vs. 14.7 months; hazard ratio for disease progression or death in the ixazomib group, 0.74; P=0.01); a benefit with respect to progression-free survival was observed with the ixazomib regimen, as compared with the placebo regimen, in all  prespecified patient subgroups, including in patients with high-risk cytogenetic abnormalities. The overall rates of response were 78% in the ixazomib group and 72% in the placebo group, and the corresponding rates of complete response plus very good partial response were 48% and 39%. The median time to response was 1.1 months in the ixazomib group and 1.9 months in the placebo group, and the corresponding median duration of response was 20.5 months and 15.0 months. At a median follow-up of approximately 23 months, the median overall survival has not been reached in either study group, and follow-up is ongoing. The rates of serious adverse events were similar in the two study groups (47% in the ixazomib group and 49% in the placebo group), as were the rates of death during the study period (4% and 6%, respectively); adverse events of at least grade  3 severity occurred in 74% and 69% of the patients, respectively. Thrombocytopenia of grade 3 and grade 4 severity occurred more frequently in the ixazomib group (12% and 7% of the patients, respectively) than in the placebo group (5% and 4% of the patients, respectively). Rash occurred more frequently in the ixazomib group than in the placebo group (36% vs. 23% of the patients), as did gastrointestinal adverse events, which were predominantly low grade. The incidence of peripheral neuropathy was 27% in the ixazomib group and 22% in the placebo group (grade 3 events occurred in 2% of the patients in each study group). Patient-reported quality of life was similar in the two study groups.  Some of the short term side-effects included, though not limited to, risk of fatigue, weight loss, tumor lysis syndrome, risk of allergic reactions, pancytopenia, life-threatening infections, need for transfusions of blood products, admission to hospital for various reasons, and risks of death.   The patient is aware that the response rates discussed earlier is not guaranteed.     After a long discussion, patient made an informed decision to proceed with the prescribed plan of care.    Hypercalcemia She has malignant hypercalcemia due to relapsed multiple myeloma. She declined admission. I will attempt to treat this as an outpatient with IV fluid hydration, corticosteroid therapy and IV Zometa next week. We will start her on treatment as soon as possible.   Anemia in neoplastic disease This is likely anemia of chronic disease and related to relapsed multiple myeloma. The patient denies recent history of bleeding such as epistaxis, hematuria or hematochezia. She is asymptomatic from the anemia. We will observe for now.  She does not require transfusion now.  I will transfuse 1 unit of blood whenever hemoglobin dropped to less than 8 g.    Leukopenia The cause of leukopenia is related to disease. I will start her on reduced dose Revlimid for this reason.    No orders of the defined types were placed in this encounter.   All questions were answered. The patient knows to call the clinic with any problems, questions or concerns. No barriers to learning was detected. I spent 40 minutes counseling the patient face to face. The total time spent in the appointment was 55 minutes and more than 50% was on counseling and review of test results     [facility], [place], MD 02/01/2015 10:06 AM

## 2015-02-01 NOTE — Progress Notes (Signed)
I faxed biologics scrip for revlimid [phone]

## 2015-02-01 NOTE — Telephone Encounter (Signed)
Left VM for daughter informing of [facility] scheduled for Monday 5/9 at [place] and pt needs to be there at 7 am,  Nothing to eat or drink after midnight the night before.  Will f/u and confirm she got this message during her infusion appt here tomorrow.   Butch Penny in [employer] was notified of [facility]

## 2015-02-01 NOTE — Assessment & Plan Note (Signed)
She has malignant hypercalcemia due to relapsed multiple myeloma. She declined admission. I will attempt to treat this as an outpatient with IV fluid hydration, corticosteroid therapy and IV Zometa next week. We will start her on treatment as soon as possible.

## 2015-02-02 ENCOUNTER — Ambulatory Visit (HOSPITAL_BASED_OUTPATIENT_CLINIC_OR_DEPARTMENT_OTHER): Payer: Medicare Other

## 2015-02-02 ENCOUNTER — Other Ambulatory Visit (HOSPITAL_BASED_OUTPATIENT_CLINIC_OR_DEPARTMENT_OTHER): Payer: Medicare Other

## 2015-02-02 ENCOUNTER — Encounter: Payer: Self-pay | Admitting: Hematology and Oncology

## 2015-02-02 ENCOUNTER — Other Ambulatory Visit: Payer: Self-pay | Admitting: Hematology and Oncology

## 2015-02-02 DIAGNOSIS — C9002 Multiple myeloma in relapse: Secondary | ICD-10-CM | POA: Diagnosis not present

## 2015-02-02 LAB — CBC & DIFF AND RETIC
BASO%: 0 % (ref 0.0–2.0)
BASOS ABS: 0 10*3/uL (ref 0.0–0.1)
EOS%: 0 % (ref 0.0–7.0)
Eosinophils Absolute: 0 10*3/uL (ref 0.0–0.5)
HCT: 30.2 % — ABNORMAL LOW (ref 34.8–46.6)
HEMOGLOBIN: 9.1 g/dL — AB (ref 11.6–15.9)
Immature Retic Fract: 21 % — ABNORMAL HIGH (ref 1.60–10.00)
LYMPH#: 0.5 10*3/uL — AB (ref 0.9–3.3)
LYMPH%: 16.6 % (ref 14.0–49.7)
MCH: 28.2 pg (ref 25.1–34.0)
MCHC: 30.1 g/dL — ABNORMAL LOW (ref 31.5–36.0)
MCV: 93.5 fL (ref 79.5–101.0)
MONO#: 0.2 10*3/uL (ref 0.1–0.9)
MONO%: 6.1 % (ref 0.0–14.0)
NEUT#: 2.3 10*3/uL (ref 1.5–6.5)
NEUT%: 77.3 % — ABNORMAL HIGH (ref 38.4–76.8)
Platelets: 263 10*3/uL (ref 145–400)
RBC: 3.23 10*6/uL — ABNORMAL LOW (ref 3.70–5.45)
RDW: 17.5 % — AB (ref 11.2–14.5)
RETIC %: 2.13 % — AB (ref 0.70–2.10)
RETIC CT ABS: 68.8 10*3/uL (ref 33.70–90.70)
WBC: 3 10*3/uL — AB (ref 3.9–10.3)
nRBC: 0 % (ref 0–0)

## 2015-02-02 LAB — COMPREHENSIVE METABOLIC PANEL (CC13)
ALT: 9 U/L (ref 0–55)
ANION GAP: 12 meq/L — AB (ref 3–11)
AST: 12 U/L (ref 5–34)
Albumin: 2.3 g/dL — ABNORMAL LOW (ref 3.5–5.0)
Alkaline Phosphatase: 170 U/L — ABNORMAL HIGH (ref 40–150)
BUN: 20.1 mg/dL (ref 7.0–26.0)
CALCIUM: 9.1 mg/dL (ref 8.4–10.4)
CHLORIDE: 105 meq/L (ref 98–109)
CO2: 22 meq/L (ref 22–29)
Creatinine: 0.9 mg/dL (ref 0.6–1.1)
EGFR: 74 mL/min/{1.73_m2} — ABNORMAL LOW (ref >90)
Glucose: 157 mg/dl — ABNORMAL HIGH (ref 70–140)
POTASSIUM: 4.4 meq/L (ref 3.5–5.1)
Sodium: 139 mEq/L (ref 136–145)
TOTAL PROTEIN: 8.9 g/dL — AB (ref 6.4–8.3)
Total Bilirubin: 0.4 mg/dL (ref 0.20–1.20)

## 2015-02-02 LAB — HOLD TUBE, BLOOD BANK

## 2015-02-02 MED ORDER — SODIUM CHLORIDE 0.9 % IV SOLN
Freq: Once | INTRAVENOUS | Status: AC
  Administered 2015-02-02: 12:00:00 via INTRAVENOUS

## 2015-02-02 MED ORDER — IXAZOMIB CITRATE 4 MG PO CAPS: 4.0000 mg | ORAL_CAPSULE | ORAL | Status: DC

## 2015-02-02 NOTE — Patient Instructions (Signed)
Dehydration, Adult Dehydration is when you lose more fluids from the body than you take in. Vital organs like the kidneys, brain, and heart cannot function without a proper amount of fluids and salt. Any loss of fluids from the body can cause dehydration.  CAUSES   Vomiting.  Diarrhea.  Excessive sweating.  Excessive urine output.  Fever. SYMPTOMS  Mild dehydration  Thirst.  Dry lips.  Slightly dry mouth. Moderate dehydration  Very dry mouth.  Sunken eyes.  Skin does not bounce back quickly when lightly pinched and released.  Dark urine and decreased urine production.  Decreased tear production.  Headache. Severe dehydration  Very dry mouth.  Extreme thirst.  Rapid, weak pulse (more than 100 beats per minute at rest).  Cold hands and feet.  Not able to sweat in spite of heat and temperature.  Rapid breathing.  Blue lips.  Confusion and lethargy.  Difficulty being awakened.  Minimal urine production.  No tears. DIAGNOSIS  Your caregiver will diagnose dehydration based on your symptoms and your exam. Blood and urine tests will help confirm the diagnosis. The diagnostic evaluation should also identify the cause of dehydration. TREATMENT  Treatment of mild or moderate dehydration can often be done at home by increasing the amount of fluids that you drink. It is best to drink small amounts of fluid more often. Drinking too much at one time can make vomiting worse. Refer to the home care instructions below. Severe dehydration needs to be treated at the hospital where you will probably be given intravenous (IV) fluids that contain water and electrolytes. HOME CARE INSTRUCTIONS   Ask your caregiver about specific rehydration instructions.  Drink enough fluids to keep your urine clear or pale yellow.  Drink small amounts frequently if you have nausea and vomiting.  Eat as you normally do.  Avoid:  Foods or drinks high in sugar.  Carbonated  drinks.  Juice.  Extremely hot or cold fluids.  Drinks with caffeine.  Fatty, greasy foods.  Alcohol.  Tobacco.  Overeating.  Gelatin desserts.  Wash your hands well to avoid spreading bacteria and viruses.  Only take over-the-counter or prescription medicines for pain, discomfort, or fever as directed by your caregiver.  Ask your caregiver if you should continue all prescribed and over-the-counter medicines.  Keep all follow-up appointments with your caregiver. SEEK MEDICAL CARE IF:  You have abdominal pain and it increases or stays in one area (localizes).  You have a rash, stiff neck, or severe headache.  You are irritable, sleepy, or difficult to awaken.  You are weak, dizzy, or extremely thirsty. SEEK IMMEDIATE MEDICAL CARE IF:   You are unable to keep fluids down or you get worse despite treatment.  You have frequent episodes of vomiting or diarrhea.  You have blood or green matter (bile) in your vomit.  You have blood in your stool or your stool looks black and tarry.  You have not urinated in 6 to 8 hours, or you have only urinated a small amount of very dark urine.  You have a fever.  You faint. MAKE SURE YOU:   Understand these instructions.  Will watch your condition.  Will get help right away if you are not doing well or get worse. Document Released: 09/15/2005 Document Revised: 12/08/2011 Document Reviewed: 05/05/2011 ExitCare Patient Information 2015 [employer]. This information is not intended to replace advice given to you by your health care provider. Make sure you discuss any questions you have with your health care   provider.  

## 2015-02-02 NOTE — Progress Notes (Signed)
I placed prior auth form for ninlaro on desk of nurse for dr. Alvy Bimler.

## 2015-02-02 NOTE — Progress Notes (Signed)
I faxed prior auth form/labs to [employer] [phone]

## 2015-02-05 ENCOUNTER — Encounter: Payer: Self-pay | Admitting: Hematology and Oncology

## 2015-02-05 ENCOUNTER — Telehealth: Payer: Self-pay

## 2015-02-05 ENCOUNTER — Other Ambulatory Visit (HOSPITAL_COMMUNITY): Payer: Self-pay | Admitting: Hematology and Oncology

## 2015-02-05 ENCOUNTER — Ambulatory Visit (HOSPITAL_BASED_OUTPATIENT_CLINIC_OR_DEPARTMENT_OTHER): Payer: Medicare Other | Admitting: Hematology and Oncology

## 2015-02-05 ENCOUNTER — Ambulatory Visit: Payer: Medicare Other

## 2015-02-05 ENCOUNTER — Telehealth: Payer: Self-pay | Admitting: Hematology and Oncology

## 2015-02-05 ENCOUNTER — Ambulatory Visit (HOSPITAL_COMMUNITY)
Admission: RE | Admit: 2015-02-05 | Discharge: 2015-02-05 | Disposition: A | Discharge status: Home / Self Care | Payer: Medicare Other | Source: Ambulatory Visit | Attending: Hematology and Oncology | Admitting: Hematology and Oncology

## 2015-02-05 ENCOUNTER — Other Ambulatory Visit: Payer: Medicare Other

## 2015-02-05 VITALS — BP 109/46 | HR 48 | Temp 98.4°F | Resp 16

## 2015-02-05 VITALS — BP 147/62 | HR 57 | Temp 97.7°F | Resp 18 | Ht 61.0 in | Wt 101.6 lb

## 2015-02-05 DIAGNOSIS — C9002 Multiple myeloma in relapse: Secondary | ICD-10-CM

## 2015-02-05 DIAGNOSIS — D630 Anemia in neoplastic disease: Secondary | ICD-10-CM | POA: Diagnosis not present

## 2015-02-05 LAB — COMPREHENSIVE METABOLIC PANEL
ALT: 39 U/L (ref 14–54)
AST: 42 U/L — AB (ref 15–41)
Albumin: 2.3 g/dL — ABNORMAL LOW (ref 3.5–5.0)
Alkaline Phosphatase: 145 U/L — ABNORMAL HIGH (ref 38–126)
Anion gap: 7 (ref 5–15)
BUN: 21 mg/dL — ABNORMAL HIGH (ref 6–20)
CALCIUM: 8.8 mg/dL — AB (ref 8.9–10.3)
CHLORIDE: 109 mmol/L (ref 101–111)
CO2: 24 mmol/L (ref 22–32)
Creatinine, Ser: 0.8 mg/dL (ref 0.44–1.00)
GFR calc Af Amer: >60 mL/min (ref >60)
GFR calc non Af Amer: >60 mL/min (ref >60)
GLUCOSE: 81 mg/dL (ref 70–99)
Potassium: 4.3 mmol/L (ref 3.5–5.1)
Sodium: 140 mmol/L (ref 135–145)
Total Bilirubin: 0.6 mg/dL (ref 0.3–1.2)
Total Protein: 7.9 g/dL (ref 6.5–8.1)

## 2015-02-05 LAB — CBC WITH DIFFERENTIAL/PLATELET
BASOS ABS: 0 10*3/uL (ref 0.0–0.1)
BASOS PCT: 0 % (ref 0–1)
EOS ABS: 0 10*3/uL (ref 0.0–0.7)
EOS PCT: 0 % (ref 0–5)
HCT: 28.4 % — ABNORMAL LOW (ref 36.0–46.0)
HEMOGLOBIN: 8.5 g/dL — AB (ref 12.0–15.0)
Lymphocytes Relative: 19 % (ref 12–46)
Lymphs Abs: 0.8 10*3/uL (ref 0.7–4.0)
MCH: 28.4 pg (ref 26.0–34.0)
MCHC: 29.9 g/dL — ABNORMAL LOW (ref 30.0–36.0)
MCV: 95 fL (ref 78.0–100.0)
MONO ABS: 0.4 10*3/uL (ref 0.1–1.0)
Monocytes Relative: 10 % (ref 3–12)
Neutro Abs: 3.1 10*3/uL (ref 1.7–7.7)
Neutrophils Relative %: 71 % (ref 43–77)
Platelets: 254 10*3/uL (ref 150–400)
RBC: 2.99 MIL/uL — ABNORMAL LOW (ref 3.87–5.11)
RDW: 17.9 % — AB (ref 11.5–15.5)
WBC: 4.3 10*3/uL (ref 4.0–10.5)

## 2015-02-05 LAB — SAMPLE TO BLOOD BANK

## 2015-02-05 LAB — BONE MARROW EXAM

## 2015-02-05 MED ORDER — ACYCLOVIR 400 MG PO TABS: 400.0000 mg | ORAL_TABLET | Freq: Every day | ORAL | Status: DC

## 2015-02-05 MED ORDER — FENTANYL CITRATE (PF) 100 MCG/2ML IJ SOLN
100.0000 ug | Freq: Once | INTRAMUSCULAR | Status: AC
  Administered 2015-02-05: 25 ug via INTRAVENOUS
  Filled 2015-02-05: qty 2

## 2015-02-05 MED ORDER — PROMETHAZINE HCL 25 MG PO TABS: 12.5000 mg | ORAL_TABLET | Freq: Four times a day (QID) | ORAL | Status: DC | PRN

## 2015-02-05 MED ORDER — MIDAZOLAM HCL 10 MG/2ML IJ SOLN
10.0000 mg | Freq: Once | INTRAMUSCULAR | Status: DC
  Filled 2015-02-05: qty 2

## 2015-02-05 MED ORDER — SODIUM CHLORIDE 0.9 % IV SOLN
Freq: Once | INTRAVENOUS | Status: AC
  Administered 2015-02-05: 08:00:00 via INTRAVENOUS

## 2015-02-05 NOTE — Discharge Instructions (Signed)
Fentanyl injection What is this medicine? FENTANYL (FEN ta nil) is a pain reliever. It is used to treat pain before, during, and after surgery. This medicine is also used before, with, and in place of other medicines for sleep during a medical procedure. This medicine may be used for other purposes; ask your health care provider or pharmacist if you have questions. How should I use this medicine? This medicine is for injection. It is given by a health-care professional in a hospital or clinic setting. Talk to your pediatrician regarding the use of this medicine in children. Special care may be needed. Overdosage: If you think you have taken too much of this medicine contact a poison control center or emergency room at once. NOTE: This medicine is only for you. Do not share this medicine with others. What if I miss a dose? This does not apply. What may interact with this medicine? -alcohol -antihistamines -barbiturates like phenobarbital -general anesthetics -MAOIs like Carbex, Eldepryl, Marplan, Nardil, and Parnate -medicines for depression, anxiety, or psychotic disturbances -medicines for sleep -muscle relaxants -narcotic medicines (opiates) for pain This list may not describe all possible interactions. Give your health care provider a list of all the medicines, herbs, non-prescription drugs, or dietary supplements you use. Also tell them if you smoke, drink alcohol, or use illegal drugs. Some items may interact with your medicine. What should I watch for while using this medicine? Your condition will be monitored carefully while you are receiving this medicine. There are different types of narcotic medicines (opiates) for pain. If you take more than one type at the same time, you may have more side effects. Give your health care provider a list of all medicines you use. Your doctor will decide how much medicine to take. Tell your doctor or health care professional if you continue to have  pain, your pain does not go away, if it gets worse, or if you have new or different type of pain. What side effects may I notice from receiving this medicine? Side effects that you should report to your doctor or health care professional as soon as possible: -allergic reactions like skin rash, itching or hives, swelling of the face, lips, or tongue -breathing problems -changes in vision -confusion -feeling faint, lightheaded -hallucination -irregular heartbeat -problems with balance, talking, walking -trouble passing urine or change in the amount of urine -unusually weak or tired Side effects that usually do not require medical attention (report to your doctor or health care professional if they continue or are bothersome): -constipation -dizzy -headache -loss of appetite -nausea, vomiting -sweating This list may not describe all possible side effects. Call your doctor for medical advice about side effects. You may report side effects to FDA at 1-800-FDA-1088. Where should I keep my medicine? This drug is given in a hospital or clinic and will not be stored at home. NOTE: This sheet is a summary. It may not cover all possible information. If you have questions about this medicine, talk to your doctor, pharmacist, or health care provider.  2015, Elsevier/Gold Standard. (2011-05-21 16:41:02) Bone Biopsy, Needle, Care After Read the instructions outlined below and refer to this sheet in the next few weeks. These discharge instructions provide you with general information on caring for yourself after you leave the hospital. Your caregiver may also give you specific instructions. While your treatment has been planned according to the most current medical practices available, unavoidable complications sometimes occur. If you have any problems or questions after discharge, call your  caregiver. Finding out the results of your test Not all test results are available during your visit. If your  test results are not back during the visit, make an appointment with your caregiver to find out the results. Do not assume everything is normal if you have not heard from your caregiver or the medical facility. It is important for you to follow up on all of your test results.  SEEK MEDICAL CARE IF:   You have redness, swelling, or increasing pain at the site of the biopsy.  You have pus coming from the biopsy site.  You have drainage from the biopsy site lasting longer than 1 day.  You notice a bad smell coming from the biopsy site or dressing.  You develop persistent nausea or vomiting. SEEK IMMEDIATE MEDICAL CARE IF:  You have a fever.  You develop a rash.  You have difficulty breathing.  You develop any reaction or side effects to medicines given. Document Released: 04/04/2005 Document Revised: 07/06/2013 Document Reviewed: 02/20/2009 [facility] Patient Information 2015 [place], [place]. This information is not intended to replace advice given to you by your health care provider. Make sure you discuss any questions you have with your health care provider.

## 2015-02-05 NOTE — Sedation Documentation (Signed)
Pt resting with dressing clean, dry, intact to left low back.

## 2015-02-05 NOTE — Assessment & Plan Note (Signed)
She has malignant hypercalcemia due to relapsed multiple myeloma. She declined admission. Her calcium level has improved with pulse steroid treatment. I will stop IV fluids. She will be a candidate for Zometa once we have obtained dental clearance.

## 2015-02-05 NOTE — Progress Notes (Signed)
[place] OFFICE PROGRESS NOTE  Patient Care Team: Cassandria Anger, MD as PCP - General [place] Dragon, MD as Consulting Physician (Gastroenterology) Leonie Man, MD as Consulting Physician (Cardiology) Heath Lark, MD as Consulting Physician (Hematology and Oncology)  SUMMARY OF ONCOLOGIC HISTORY:  She was initially diagnosed in October, 2008. Initial treatment with oral Alkeran, prednisone, and Revlimid. Revlimid stopped early on due to a dysphoric reaction. She was treated for 6 months. She reached a stable plateau and had a long treatment free interval. Last cycle given in October 2009. Secondary to rising paraprotein levels, treatment was reinitiated with oral pomalidomide in February 2014. Initial dose 2 mg 21 days on 7 day rest. She tolerated this well and was escalated to 3 mg. There was a slow but steady improvement in her paraprotein levels. Due to various reasons, pomalidomide was held since 09/02/2013.  In May 2016, she have disease relapse with hypercalcemia. On 02/05/2015, bone marrow biopsy was performed and pending. She was started on high-dose dexamethasone INTERVAL HISTORY: Please see below for problem oriented charting. She feels better since we started her on dexamethasone and IV fluids. She denies bone pain. Appetite is improving. REVIEW OF SYSTEMS:   Constitutional: Denies fevers, chills or abnormal weight loss Eyes: Denies blurriness of vision Ears, nose, mouth, throat, and face: Denies mucositis or sore throat Respiratory: Denies cough, dyspnea or wheezes Cardiovascular: Denies palpitation, chest discomfort or lower extremity swelling Gastrointestinal:  Denies nausea, heartburn or change in bowel habits Skin: Denies abnormal skin rashes Lymphatics: Denies new lymphadenopathy or easy bruising Neurological:Denies numbness, tingling or new weaknesses Behavioral/Psych: Mood is stable, no new changes  All other systems were reviewed with the  patient and are negative.  I have reviewed the past medical history, past surgical history, social history and family history with the patient and they are unchanged from previous note.  ALLERGIES:  is allergic to amiodarone; amoxicillin; aspirin; atenolol; nortriptyline; latex; and zoloft.  MEDICATIONS:  Current Outpatient Prescriptions  Medication Sig Dispense Refill  . amLODipine (NORVASC) 5 MG tablet TAKES 0.5 TABLET (2.5 MG TOTAL) BY MOUTH DAILY. 90 tablet 3  . dexamethasone (DECADRON) 4 MG tablet Take 5 tablets (20 mg total) by mouth daily. 60 tablet 0  . ergocalciferol (VITAMIN D2) 50000 UNITS capsule Take 1 capsule (50,000 Units total) by mouth once a week. 6 capsule 0  . LORazepam (ATIVAN) 1 MG tablet Take 1 tablet (1 mg total) by mouth every 8 (eight) hours as needed for anxiety. (Patient taking differently: Take 0.5 mg by mouth every 8 (eight) hours as needed for anxiety. ) 60 tablet 3  . OVER THE COUNTER MEDICATION Take 2 capsules by mouth daily. Potassium supplement    . acyclovir (ZOVIRAX) 400 MG tablet Take 1 tablet (400 mg total) by mouth daily. 30 tablet 6  . donepezil (ARICEPT) 5 MG tablet Take 1 tablet (5 mg total) by mouth at bedtime. (Patient not taking: Reported on 01/31/2015) 30 tablet 5  . ixazomib citrate (NINLARO) 4 MG capsule Take 1 capsule (4 mg total) by mouth once a week. Take on an empty stomach 1hr before or 2hrs after food. Do not crush, chew or open. Take on days 1,8,15 and skip day 22 (Patient not taking: Reported on 02/05/2015) 3 capsule 0  . lenalidomide (REVLIMID) 5 MG capsule Take 1 capsule (5 mg total) by mouth daily. (Patient not taking: Reported on 02/05/2015) 21 capsule 0  . promethazine (PHENERGAN) 25 MG tablet Take 0.5 tablets (  12.5 mg total) by mouth every 6 (six) hours as needed for nausea. 30 tablet 3   No current facility-administered medications for this visit.   Facility-Administered Medications Ordered in Other Visits  Medication Dose Route  Frequency Provider Last Rate Last Dose  . 0.9 %  sodium chloride infusion   Intravenous Once Artis Delay, MD      . midazolam (VERSED) injection 10 mg  10 mg Intravenous Once Artis Delay, MD   10 mg at 02/05/15 1052    PHYSICAL EXAMINATION: ECOG PERFORMANCE STATUS: 2 - Symptomatic, <50% confined to bed  Filed Vitals:   02/05/15 1018  BP: 147/62  Pulse: 57  Temp: 97.7 F (36.5 C)  Resp: 18   Filed Weights   02/05/15 1018  Weight: 101 lb 9.6 oz (46.085 kg)    GENERAL:alert, no distress and comfortable SKIN: skin color, texture, turgor are normal, no rashes or significant lesions EYES: normal, Conjunctiva are pink and non-injected, sclera clear OROPHARYNX:no exudate, no erythema and lips, buccal mucosa, and tongue normal  Musculoskeletal:no cyanosis of digits and no clubbing  NEURO: alert & oriented x 3 with fluent speech, no focal motor/sensory deficits  LABORATORY DATA:  I have reviewed the data as listed    Component Value Date/Time   NA 140 02/05/2015 0800   NA 139 02/02/2015 1123   K 4.3 02/05/2015 0800   K 4.4 02/02/2015 1123   CL 109 02/05/2015 0800   CL 103 03/11/2013 1503   CO2 24 02/05/2015 0800   CO2 22 02/02/2015 1123   GLUCOSE 81 02/05/2015 0800   GLUCOSE 157* 02/02/2015 1123   GLUCOSE 91 03/11/2013 1503   BUN 21* 02/05/2015 0800   BUN 20.1 02/02/2015 1123   CREATININE 0.80 02/05/2015 0800   CREATININE 0.9 02/02/2015 1123   CALCIUM 8.8* 02/05/2015 0800   CALCIUM 9.1 02/02/2015 1123   CALCIUM 11.4* 12/30/2014 0835   PROT 7.9 02/05/2015 0800   PROT 8.9* 02/02/2015 1123   ALBUMIN 2.3* 02/05/2015 0800   ALBUMIN 2.3* 02/02/2015 1123   AST 42* 02/05/2015 0800   AST 12 02/02/2015 1123   ALT 39 02/05/2015 0800   ALT 9 02/02/2015 1123   ALKPHOS 145* 02/05/2015 0800   ALKPHOS 170* 02/02/2015 1123   BILITOT 0.6 02/05/2015 0800   BILITOT 0.40 02/02/2015 1123   GFRNONAA >60 02/05/2015 0800   GFRAA >60 02/05/2015 0800    No results found for: SPEP,  UPEP  Lab Results  Component Value Date   WBC 4.3 02/05/2015   NEUTROABS 3.1 02/05/2015   HGB 8.5* 02/05/2015   HCT 28.4* 02/05/2015   MCV 95.0 02/05/2015   PLT 254 02/05/2015      Chemistry      Component Value Date/Time   NA 140 02/05/2015 0800   NA 139 02/02/2015 1123   K 4.3 02/05/2015 0800   K 4.4 02/02/2015 1123   CL 109 02/05/2015 0800   CL 103 03/11/2013 1503   CO2 24 02/05/2015 0800   CO2 22 02/02/2015 1123   BUN 21* 02/05/2015 0800   BUN 20.1 02/02/2015 1123   CREATININE 0.80 02/05/2015 0800   CREATININE 0.9 02/02/2015 1123      Component Value Date/Time   CALCIUM 8.8* 02/05/2015 0800   CALCIUM 9.1 02/02/2015 1123   CALCIUM 11.4* 12/30/2014 0835   ALKPHOS 145* 02/05/2015 0800   ALKPHOS 170* 02/02/2015 1123   AST 42* 02/05/2015 0800   AST 12 02/02/2015 1123   ALT 39 02/05/2015 0800  ALT 9 02/02/2015 1123   BILITOT 0.6 02/05/2015 0800   BILITOT 0.40 02/02/2015 1123     ASSESSMENT & PLAN:  Multiple myeloma in relapse For treatment option, the patient has difficulties with traveling back and forth the cancer center for treatment. I recommend a trial of Ninlaro, Revlimid and dexamethasone along with Zometa. She will obtain dental clearance. I recommend dexamethasone 20 mg weekly on Tuesdays. I gave her a treatment calendar to improve compliance. She will obtain medication delivery of the Ninlaro and Revlimid today.  The decision was made based on recent publication on NEJM. Role of treatment is palliative.  Oral Ixazomib, Lenalidomide, and Dexamethasone for Multiple Myeloma Stacie Acres, M.D., Marin Shutter, M.D., Ph.D., Coralie Keens, M.D., Ph.D., Vivianne Master, M.D., Towanda Malkin, M.D., Ph.D., Berenda Morale, M.D., Serita Grammes, M.D., Pincus Large, B.M., B.Ch., Zachary George, M.B., Ch.B., Nicholes Mango, M.B., Ch.B., Threasa Beards, M.D., Loralee Pacas. Bonne Dolores., Ch.B., Lelon Frohlich, M.D., D.M.Sci., Lucienne Capers, M.D., Kae Heller,  M.D., Hope Pigeon, M.D., Rodena Goldmann, M.D., Randa Spike, M.D., Ph.D., Joellyn Rued, M.D., Edyth Gunnels. Serafina Mitchell, M.D., Apolinar Junes, R.N., M.S.N., Elita Boone, Ph.D., Rolm Baptise, Ph.D., Reatha Armour, M.D., Ph.D., [facility] Clyda Hurdle, M.D., Ph.D., and Loyal Gambler. Marvel Plan, M.D., for the [facility]* [employer] [facility] 2016; [phone] 28, 2016DOI: 10.1056/[id number]  Ixazomib is an oral proteasome inhibitor that is currently being studied for the treatment of multiple myeloma  In this double-blind, placebo-controlled, phase 3 trial, we randomly assigned 722 patients who had relapsed, refractory, or relapsed and refractory multiple myeloma to receive ixazomib plus lenalidomide-dexamethasone (ixazomib group) or placebo plus lenalidomide-dexamethasone (placebo group).   Progression-free survival was significantly longer in the ixazomib group than in the placebo group at a median follow-up of 14.7 months (median progression-free survival, 20.6 months vs. 14.7 months; hazard ratio for disease progression or death in the ixazomib group, 0.74; P=0.01); a benefit with respect to progression-free survival was observed with the ixazomib regimen, as compared with the placebo regimen, in all prespecified patient subgroups, including in patients with high-risk cytogenetic abnormalities. The overall rates of response were 78% in the ixazomib group and 72% in the placebo group, and the corresponding rates of complete response plus very good partial response were 48% and 39%. The median time to response was 1.1 months in the ixazomib group and 1.9 months in the placebo group, and the corresponding median duration of response was 20.5 months and 15.0 months. At a median follow-up of approximately 23 months, the median overall survival has not been reached in either study group, and follow-up is ongoing. The rates of serious adverse events were similar in the two study groups (47% in the  ixazomib group and 49% in the placebo group), as were the rates of death during the study period (4% and 6%, respectively); adverse events of at least grade 3 severity occurred in 74% and 69% of the patients, respectively. Thrombocytopenia of grade 3 and grade 4 severity occurred more frequently in the ixazomib group (12% and 7% of the patients, respectively) than in the placebo group (5% and 4% of the patients, respectively). Rash occurred more frequently in the ixazomib group than in the placebo group (36% vs. 23% of the patients), as did gastrointestinal adverse events, which were predominantly low grade. The incidence of peripheral neuropathy was 27% in the ixazomib group and 22% in the placebo group (grade 3 events occurred in 2% of the patients in each study group). Patient-reported  quality of life was similar in the two study groups.  Some of the short term side-effects included, though not limited to, risk of fatigue, weight loss, tumor lysis syndrome, risk of allergic reactions, pancytopenia, life-threatening infections, need for transfusions of blood products, admission to hospital for various reasons, and risks of death.   The patient is aware that the response rates discussed earlier is not guaranteed.    After a long discussion, patient made an informed decision to proceed with the prescribed plan of care.  She will return on a weekly basis for blood work monitoring for the first month. In addition to above, she will start prophylactic medication with acyclovir for prevention against viral infection, aspirin to prevent DVT, calcium and vitamin D.   Anemia in neoplastic disease This is likely anemia of chronic disease and related to relapsed multiple myeloma. The patient denies recent history of bleeding such as epistaxis, hematuria or hematochezia. She is asymptomatic from the anemia. We will observe for now.  She does not require transfusion now.  I will transfuse 1 unit of blood whenever  hemoglobin dropped to less than 8 g.    Hypercalcemia She has malignant hypercalcemia due to relapsed multiple myeloma. She declined admission. Her calcium level has improved with pulse steroid treatment. I will stop IV fluids. She will be a candidate for Zometa once we have obtained dental clearance.    No orders of the defined types were placed in this encounter.   All questions were answered. The patient knows to call the clinic with any problems, questions or concerns. No barriers to learning was detected. I spent 30 minutes counseling the patient face to face. The total time spent in the appointment was 40 minutes and more than 50% was on counseling and review of test results     [facility], [place], MD 02/05/2015 2:18 PM

## 2015-02-05 NOTE — Progress Notes (Signed)
Will place this note under a new phone encounter

## 2015-02-05 NOTE — Sedation Documentation (Signed)
Medication dose calculated and verified for 25 mcg Fentanyl

## 2015-02-05 NOTE — Progress Notes (Signed)
Per [facility] ninlaro is approved 02/02/15-02/02/16 [id number] under medicare part D. I send to medical records.

## 2015-02-05 NOTE — Telephone Encounter (Signed)
Family called stating that they received Revlimid but have not received ninlaro. Called [facility] pharmacy and they did not receive the Rx. Called [facility] outpatient pharmacy and they have ninlaro on hold for further instruction. Called [facility] and they did not receive the hard copy, they were not even sure if they carried this drug. There is a note from Raquel this morning that [facility] did have a prior auth for this medication. LVM with Charlena Cross if she has sent the hard copy anywhere.  S/w ebony. She did not send the rx anywhere. She said she got a pa and to try to call [facility] outpatient to see if they can fill it with the pa # she got from [facility] S/w [facility] pharmacy with that number. They are able to fill rx.   S/w daughter that she can pick up at [facility] tomorrow. Gave directions.

## 2015-02-05 NOTE — Telephone Encounter (Signed)
Gave and printed appt sched and avs for pt for May and JUNE °

## 2015-02-05 NOTE — Sedation Documentation (Signed)
Family, daughter, updated as to patient's status. Pt sleeping. Procedure complete.

## 2015-02-05 NOTE — Sedation Documentation (Signed)
MD at bedside. 

## 2015-02-05 NOTE — Procedures (Signed)
Brief examination was performed. ENT: adequate airway clearance Heart: regular rate and rhythm.No Murmurs Lungs: clear to auscultation, no wheezes, normal respiratory effort  American Society of Anesthesiologists ASA scale 2  Mallampati Score of 3  Bone Marrow Biopsy and Aspiration Procedure Note   Informed consent was obtained and potential risks including bleeding, infection and pain were reviewed with the patient. I verified that the patient has been fasting since midnight.  The patient's name, date of birth, identification, consent and allergies were verified prior to the start of procedure and time out was performed.  A total of 25 mcg of IV fentanyl were given.  The left posterior iliac crest was chosen as the site of biopsy.  The skin was prepped with Betadine solution.   8 cc of 1% lidocaine was used to provide local anaesthesia.   10 cc of bone marrow aspirate was obtained followed by 1 inch biopsy.   The procedure was tolerated well and there were no complications.  The patient was stable at the end of the procedure.  Specimens sent for flow cytometry, cytogenetics and additional studies.

## 2015-02-05 NOTE — Assessment & Plan Note (Signed)
This is likely anemia of chronic disease and related to relapsed multiple myeloma. The patient denies recent history of bleeding such as epistaxis, hematuria or hematochezia. She is asymptomatic from the anemia. We will observe for now.  She does not require transfusion now.  I will transfuse 1 unit of blood whenever hemoglobin dropped to less than 8 g.     

## 2015-02-05 NOTE — Assessment & Plan Note (Addendum)
For treatment option, the patient has difficulties with traveling back and forth the cancer center for treatment. I recommend a trial of Ninlaro, Revlimid and dexamethasone along with Zometa. She will obtain dental clearance. I recommend dexamethasone 20 mg weekly on Tuesdays. I gave her a treatment calendar to improve compliance. She will obtain medication delivery of the Ninlaro and Revlimid today.  The decision was made based on recent publication on NEJM. Role of treatment is palliative.  Oral Ixazomib, Lenalidomide, and Dexamethasone for Multiple Myeloma Victoria [name], M.D., Marin Shutter, M.D., Ph.D., Coralie Keens, M.D., Ph.D., Vivianne Master, M.D., Towanda Malkin, M.D., Ph.D., Berenda Morale, M.D., Serita Grammes, M.D., Pincus Large, B.M., B.Ch., Zachary George, M.B., Ch.B., Nicholes Mango, M.B., Ch.B., Threasa Beards, M.D., Loralee Pacas. Bonne Dolores., Ch.B., Lelon Frohlich, M.D., D.M.Sci., Lucienne Capers, M.D., Kae Heller, M.D., Hope Pigeon, M.D., Rodena Goldmann, M.D., Randa Spike, M.D., Ph.D., Joellyn Rued, M.D., Edyth Gunnels. Serafina Mitchell, M.D., Apolinar Junes, R.N., M.S.N., Elita Boone, Ph.D., Rolm Baptise, Ph.D., Reatha Armour, M.D., Ph.D., [facility] Clyda Hurdle, M.D., Ph.D., and Loyal Gambler. Marvel Plan, M.D., for the [facility]* [employer] [facility] 2016; [phone] 28, 2016DOI: 10.1056/[id number]  Ixazomib is an oral proteasome inhibitor that is currently being studied for the treatment of multiple myeloma  In this double-blind, placebo-controlled, phase 3 trial, we randomly assigned 722 patients who had relapsed, refractory, or relapsed and refractory multiple myeloma to receive ixazomib plus lenalidomide-dexamethasone (ixazomib group) or placebo plus lenalidomide-dexamethasone (placebo group).   Progression-free survival was significantly longer in the ixazomib group than in the placebo group at a median follow-up of 14.7 months (median progression-free  survival, 20.6 months vs. 14.7 months; hazard ratio for disease progression or death in the ixazomib group, 0.74; P=0.01); a benefit with respect to progression-free survival was observed with the ixazomib regimen, as compared with the placebo regimen, in all prespecified patient subgroups, including in patients with high-risk cytogenetic abnormalities. The overall rates of response were 78% in the ixazomib group and 72% in the placebo group, and the corresponding rates of complete response plus very good partial response were 48% and 39%. The median time to response was 1.1 months in the ixazomib group and 1.9 months in the placebo group, and the corresponding median duration of response was 20.5 months and 15.0 months. At a median follow-up of approximately 23 months, the median overall survival has not been reached in either study group, and follow-up is ongoing. The rates of serious adverse events were similar in the two study groups (47% in the ixazomib group and 49% in the placebo group), as were the rates of death during the study period (4% and 6%, respectively); adverse events of at least grade 3 severity occurred in 74% and 69% of the patients, respectively. Thrombocytopenia of grade 3 and grade 4 severity occurred more frequently in the ixazomib group (12% and 7% of the patients, respectively) than in the placebo group (5% and 4% of the patients, respectively). Rash occurred more frequently in the ixazomib group than in the placebo group (36% vs. 23% of the patients), as did gastrointestinal adverse events, which were predominantly low grade. The incidence of peripheral neuropathy was 27% in the ixazomib group and 22% in the placebo group (grade 3 events occurred in 2% of the patients in each study group). Patient-reported quality of life was similar in the two study groups.  Some of the short term side-effects included, though not limited to, risk of fatigue, weight loss, tumor lysis  syndrome, risk of  allergic reactions, pancytopenia, life-threatening infections, need for transfusions of blood products, admission to hospital for various reasons, and risks of death.   The patient is aware that the response rates discussed earlier is not guaranteed.    After a long discussion, patient made an informed decision to proceed with the prescribed plan of care.  She will return on a weekly basis for blood work monitoring for the first month. In addition to above, she will start prophylactic medication with acyclovir for prevention against viral infection, aspirin to prevent DVT, calcium and vitamin D.

## 2015-02-05 NOTE — Sedation Documentation (Signed)
Patient denies pain and is resting comfortably.  

## 2015-02-06 ENCOUNTER — Encounter: Payer: Self-pay | Admitting: *Deleted

## 2015-02-08 NOTE — Progress Notes (Signed)
[place] Social Work  Clinical Social Work was referred by patient's daughter to review and complete healthcare advance directives.  Clinical Social Worker met with patient daughter, and son in [place] office.  The patient designated son Coralee Pesa as their primary healthcare agent and daughter Ebony Hail as their secondary agent.  Patient also completed healthcare living will.    Clinical Social Worker notarized documents and made copies for patient/family. Clinical Social Worker will send documents to medical records to be scanned into patient's chart. Clinical Social Worker encouraged patient/family to contact with any additional questions or concerns.  Polo Riley, MSW, [place] Worker [facility] [phone]

## 2015-02-09 ENCOUNTER — Encounter: Payer: Self-pay | Admitting: Hematology and Oncology

## 2015-02-09 ENCOUNTER — Telehealth: Payer: Self-pay | Admitting: *Deleted

## 2015-02-09 ENCOUNTER — Other Ambulatory Visit (HOSPITAL_BASED_OUTPATIENT_CLINIC_OR_DEPARTMENT_OTHER): Payer: Medicare Other

## 2015-02-09 DIAGNOSIS — C9002 Multiple myeloma in relapse: Secondary | ICD-10-CM

## 2015-02-09 LAB — CBC & DIFF AND RETIC
BASO%: 0.3 % (ref 0.0–2.0)
BASOS ABS: 0 10*3/uL (ref 0.0–0.1)
EOS%: 0.5 % (ref 0.0–7.0)
Eosinophils Absolute: 0 10*3/uL (ref 0.0–0.5)
HEMATOCRIT: 31 % — AB (ref 34.8–46.6)
HEMOGLOBIN: 9.5 g/dL — AB (ref 11.6–15.9)
Immature Retic Fract: 10.2 % — ABNORMAL HIGH (ref 1.60–10.00)
LYMPH#: 0.5 10*3/uL — AB (ref 0.9–3.3)
LYMPH%: 13.8 % — AB (ref 14.0–49.7)
MCH: 28.8 pg (ref 25.1–34.0)
MCHC: 30.6 g/dL — ABNORMAL LOW (ref 31.5–36.0)
MCV: 93.9 fL (ref 79.5–101.0)
MONO#: 0.4 10*3/uL (ref 0.1–0.9)
MONO%: 9.4 % (ref 0.0–14.0)
NEUT#: 3 10*3/uL (ref 1.5–6.5)
NEUT%: 76 % (ref 38.4–76.8)
PLATELETS: 264 10*3/uL (ref 145–400)
RBC: 3.3 10*6/uL — ABNORMAL LOW (ref 3.70–5.45)
RDW: 18.4 % — AB (ref 11.2–14.5)
RETIC CT ABS: 62.04 10*3/uL (ref 33.70–90.70)
Retic %: 1.88 % (ref 0.70–2.10)
WBC: 3.9 10*3/uL (ref 3.9–10.3)

## 2015-02-09 LAB — COMPREHENSIVE METABOLIC PANEL (CC13)
ALBUMIN: 2.4 g/dL — AB (ref 3.5–5.0)
ALT: 22 U/L (ref 0–55)
ANION GAP: 8 meq/L (ref 3–11)
AST: 16 U/L (ref 5–34)
Alkaline Phosphatase: 170 U/L — ABNORMAL HIGH (ref 40–150)
BUN: 14.1 mg/dL (ref 7.0–26.0)
CALCIUM: 9 mg/dL (ref 8.4–10.4)
CO2: 27 meq/L (ref 22–29)
CREATININE: 0.9 mg/dL (ref 0.6–1.1)
Chloride: 108 mEq/L (ref 98–109)
EGFR: 71 mL/min/{1.73_m2} — AB (ref >90)
Glucose: 107 mg/dl (ref 70–140)
Potassium: 3.8 mEq/L (ref 3.5–5.1)
Sodium: 143 mEq/L (ref 136–145)
TOTAL PROTEIN: 9.1 g/dL — AB (ref 6.4–8.3)
Total Bilirubin: 0.56 mg/dL (ref 0.20–1.20)

## 2015-02-09 LAB — HOLD TUBE, BLOOD BANK

## 2015-02-09 NOTE — Telephone Encounter (Signed)
Ebony Hail called reporting "problem with MyChart.  Biopsy was done on Monday but I can't see the results."  Results are 'in process' and will be discussed at next F/U visit.  Advised to sign proxy form and send to [facility] to access [place].

## 2015-02-09 NOTE — Progress Notes (Signed)
Per Pincus Large zero copay for revlimid and was delivered to patient 02/05/15

## 2015-02-12 ENCOUNTER — Telehealth: Payer: Self-pay | Admitting: *Deleted

## 2015-02-12 NOTE — Telephone Encounter (Signed)
Left VM for pt informing of Dr. Calton Dach note below and reminded of appt this Friday 5/20.

## 2015-02-12 NOTE — Telephone Encounter (Signed)
-----   Message from Heath Lark, MD sent at 02/09/2015  4:42 PM EDT ----- Regarding: labs You can call next week Labs are OK ----- Message -----    From: [facility] in [facility] Interface    Sent: 02/09/2015   4:09 PM      To: Heath Lark, MD

## 2015-02-13 LAB — CHROMOSOME ANALYSIS, BONE MARROW

## 2015-02-13 LAB — TISSUE HYBRIDIZATION (BONE MARROW)-NCBH

## 2015-02-16 ENCOUNTER — Encounter: Payer: Self-pay | Admitting: Hematology and Oncology

## 2015-02-16 ENCOUNTER — Ambulatory Visit (HOSPITAL_BASED_OUTPATIENT_CLINIC_OR_DEPARTMENT_OTHER): Payer: Medicare Other | Admitting: Hematology and Oncology

## 2015-02-16 ENCOUNTER — Ambulatory Visit (HOSPITAL_BASED_OUTPATIENT_CLINIC_OR_DEPARTMENT_OTHER): Payer: Medicare Other

## 2015-02-16 ENCOUNTER — Telehealth: Payer: Self-pay | Admitting: Hematology and Oncology

## 2015-02-16 ENCOUNTER — Other Ambulatory Visit (HOSPITAL_BASED_OUTPATIENT_CLINIC_OR_DEPARTMENT_OTHER): Payer: Medicare Other

## 2015-02-16 VITALS — BP 115/71 | HR 129 | Temp 98.2°F | Resp 18 | Ht 61.0 in | Wt 94.9 lb

## 2015-02-16 DIAGNOSIS — D630 Anemia in neoplastic disease: Secondary | ICD-10-CM

## 2015-02-16 DIAGNOSIS — E860 Dehydration: Secondary | ICD-10-CM

## 2015-02-16 DIAGNOSIS — K521 Toxic gastroenteritis and colitis: Secondary | ICD-10-CM | POA: Diagnosis not present

## 2015-02-16 DIAGNOSIS — R51 Headache: Secondary | ICD-10-CM

## 2015-02-16 DIAGNOSIS — C9002 Multiple myeloma in relapse: Secondary | ICD-10-CM

## 2015-02-16 DIAGNOSIS — I959 Hypotension, unspecified: Secondary | ICD-10-CM

## 2015-02-16 LAB — CBC & DIFF AND RETIC
BASO%: 0 % (ref 0.0–2.0)
Basophils Absolute: 0 10*3/uL (ref 0.0–0.1)
EOS%: 0 % (ref 0.0–7.0)
Eosinophils Absolute: 0 10*3/uL (ref 0.0–0.5)
HEMATOCRIT: 31.8 % — AB (ref 34.8–46.6)
HEMOGLOBIN: 9.8 g/dL — AB (ref 11.6–15.9)
IMMATURE RETIC FRACT: 6.6 % (ref 1.60–10.00)
LYMPH#: 0.6 10*3/uL — AB (ref 0.9–3.3)
LYMPH%: 9.3 % — ABNORMAL LOW (ref 14.0–49.7)
MCH: 28.4 pg (ref 25.1–34.0)
MCHC: 30.8 g/dL — ABNORMAL LOW (ref 31.5–36.0)
MCV: 92.2 fL (ref 79.5–101.0)
MONO#: 0.3 10*3/uL (ref 0.1–0.9)
MONO%: 5.1 % (ref 0.0–14.0)
NEUT%: 85.6 % — AB (ref 38.4–76.8)
NEUTROS ABS: 5.4 10*3/uL (ref 1.5–6.5)
Platelets: 188 10*3/uL (ref 145–400)
RBC: 3.45 10*6/uL — ABNORMAL LOW (ref 3.70–5.45)
RDW: 18.9 % — ABNORMAL HIGH (ref 11.2–14.5)
RETIC CT ABS: 32.78 10*3/uL — AB (ref 33.70–90.70)
Retic %: 0.95 % (ref 0.70–2.10)
WBC: 6.3 10*3/uL (ref 3.9–10.3)

## 2015-02-16 LAB — COMPREHENSIVE METABOLIC PANEL (CC13)
ALT: 12 U/L (ref 0–55)
AST: 34 U/L (ref 5–34)
Albumin: 2.5 g/dL — ABNORMAL LOW (ref 3.5–5.0)
Alkaline Phosphatase: 136 U/L (ref 40–150)
Anion Gap: 13 mEq/L — ABNORMAL HIGH (ref 3–11)
BILIRUBIN TOTAL: 0.67 mg/dL (ref 0.20–1.20)
BUN: 24.8 mg/dL (ref 7.0–26.0)
CALCIUM: 9.1 mg/dL (ref 8.4–10.4)
CO2: 24 meq/L (ref 22–29)
Chloride: 105 mEq/L (ref 98–109)
Creatinine: 1 mg/dL (ref 0.6–1.1)
EGFR: 63 mL/min/{1.73_m2} — AB (ref >90)
Glucose: 135 mg/dl (ref 70–140)
Potassium: 3.4 mEq/L — ABNORMAL LOW (ref 3.5–5.1)
SODIUM: 142 meq/L (ref 136–145)
Total Protein: 9.7 g/dL — ABNORMAL HIGH (ref 6.4–8.3)

## 2015-02-16 LAB — HOLD TUBE, BLOOD BANK

## 2015-02-16 MED ORDER — SODIUM CHLORIDE 0.9 % IV SOLN
Freq: Once | INTRAVENOUS | Status: AC
  Administered 2015-02-16: 12:00:00 via INTRAVENOUS

## 2015-02-16 MED ORDER — TRAMADOL HCL 50 MG PO TABS: 50.0000 mg | ORAL_TABLET | Freq: Four times a day (QID) | ORAL | Status: DC | PRN

## 2015-02-16 NOTE — Telephone Encounter (Signed)
Appointments made and she will get her avs in [place]

## 2015-02-16 NOTE — Assessment & Plan Note (Signed)
Her diarrhea could be due to medication. I recommend holding off Revlimid until next week. I recommend round-the-clock Imodium. Today I will give her Lomotil. I will give her IV fluids for dehydration.

## 2015-02-16 NOTE — Assessment & Plan Note (Signed)
This is likely anemia of chronic disease and related to relapsed multiple myeloma. The patient denies recent history of bleeding such as epistaxis, hematuria or hematochezia. She is asymptomatic from the anemia. We will observe for now.  She does not require transfusion now.  I will transfuse 1 unit of blood whenever hemoglobin dropped to less than 8 g.     

## 2015-02-16 NOTE — Assessment & Plan Note (Signed)
She has signs of dehydration with tachycardia and mild hypotension. I recommend IV fluids today. She agreed to proceed.

## 2015-02-16 NOTE — Assessment & Plan Note (Signed)
She tolerated treatment poorly with side effects. I recommend she continue on Ninlaro and dexamethasone weekly. I recommend she hold off Revlimid for now and resume next week if her diarrhea resolved

## 2015-02-16 NOTE — Progress Notes (Signed)
[name] OFFICE PROGRESS NOTE  Patient Care Team: Cassandria Anger, MD as PCP - General Victoria Dragon, MD as Consulting Physician (Gastroenterology) Leonie Man, MD as Consulting Physician (Cardiology) Heath Lark, MD as Consulting Physician (Hematology and Oncology)  SUMMARY OF ONCOLOGIC HISTORY:   Multiple myeloma in relapse   12/30/2014 Initial Diagnosis Multiple myeloma in relapse   02/05/2015 Bone Marrow Biopsy BM biopsy was non-diagnostic. FISH was positive for loss of 17p and gain of chromosome 11    She was initially diagnosed in October, 2008. Initial treatment with oral Alkeran, prednisone, and Revlimid. Revlimid stopped early on due to a dysphoric reaction. She was treated for 6 months. She reached a stable plateau and had a long treatment free interval. Last cycle given in October 2009. Secondary to rising paraprotein levels, treatment was reinitiated with oral pomalidomide in February 2014. Initial dose 2 mg 21 days on 7 day rest. She tolerated this well and was escalated to 3 mg. There was a slow but steady improvement in her paraprotein levels. Due to various reasons, pomalidomide was held since 09/02/2013.  In May 2016, she have disease relapse with hypercalcemia. On 02/05/2015, bone marrow biopsy was performed and pending. She was started on high-dose dexamethasone INTERVAL HISTORY: Please see below for problem oriented charting. She returns for further follow-up. She started to have profuse diarrhea since yesterday with associated nausea. Denies fevers or chills. Denies dizziness. Denies peripheral neuropathy. She has occasional headaches.  REVIEW OF SYSTEMS:   Constitutional: Denies fevers, chills or abnormal weight loss Eyes: Denies blurriness of vision Ears, nose, mouth, throat, and face: Denies mucositis or sore throat Respiratory: Denies cough, dyspnea or wheezes Cardiovascular: Denies palpitation, chest discomfort or lower extremity  swelling Skin: Denies abnormal skin rashes Lymphatics: Denies new lymphadenopathy or easy bruising Neurological:Denies numbness, tingling or new weaknesses Behavioral/Psych: Mood is stable, no new changes  All other systems were reviewed with the patient and are negative.  I have reviewed the past medical history, past surgical history, social history and family history with the patient and they are unchanged from previous note.  ALLERGIES:  is allergic to amiodarone; amoxicillin; aspirin; atenolol; nortriptyline; latex; and zoloft.  MEDICATIONS:  Current Outpatient Prescriptions  Medication Sig Dispense Refill  . acyclovir (ZOVIRAX) 400 MG tablet Take 1 tablet (400 mg total) by mouth daily. 30 tablet 6  . amLODipine (NORVASC) 5 MG tablet TAKES 0.5 TABLET (2.5 MG TOTAL) BY MOUTH DAILY. 90 tablet 3  . dexamethasone (DECADRON) 4 MG tablet Take 5 tablets (20 mg total) by mouth daily. 60 tablet 0  . ergocalciferol (VITAMIN D2) 50000 UNITS capsule Take 1 capsule (50,000 Units total) by mouth once a week. 6 capsule 0  . ixazomib citrate (NINLARO) 4 MG capsule Take 1 capsule (4 mg total) by mouth once a week. Take on an empty stomach 1hr before or 2hrs after food. Do not crush, chew or open. Take on days 1,8,15 and skip day 22 3 capsule 0  . lenalidomide (REVLIMID) 5 MG capsule Take 1 capsule (5 mg total) by mouth daily. 21 capsule 0  . LORazepam (ATIVAN) 1 MG tablet Take 1 tablet (1 mg total) by mouth every 8 (eight) hours as needed for anxiety. (Patient taking differently: Take 0.5 mg by mouth every 8 (eight) hours as needed for anxiety. ) 60 tablet 3  . OVER THE COUNTER MEDICATION Take 2 capsules by mouth daily. Potassium supplement    . promethazine (PHENERGAN) 25 MG tablet Take  0.5 tablets (12.5 mg total) by mouth every 6 (six) hours as needed for nausea. 30 tablet 3  . donepezil (ARICEPT) 5 MG tablet Take 1 tablet (5 mg total) by mouth at bedtime. (Patient not taking: Reported on 02/16/2015) 30  tablet 5  . traMADol (ULTRAM) 50 MG tablet Take 1 tablet (50 mg total) by mouth every 6 (six) hours as needed. 60 tablet 0   No current facility-administered medications for this visit.   Facility-Administered Medications Ordered in Other Visits  Medication Dose Route Frequency Provider Last Rate Last Dose  . 0.9 %  sodium chloride infusion   Intravenous Once Heath Lark, MD        PHYSICAL EXAMINATION: ECOG PERFORMANCE STATUS: 2 - Symptomatic, <50% confined to bed  Filed Vitals:   02/16/15 1132  BP: 115/71  Pulse: 129  Temp: 98.2 F (36.8 C)  Resp: 18   Filed Weights   02/16/15 1132  Weight: 94 lb 14.4 oz (43.046 kg)    GENERAL:alert, no distress and comfortable. She looks thin and frail SKIN: skin color, texture, turgor are normal, no rashes or significant lesions EYES: normal, Conjunctiva are pink and non-injected, sclera clear OROPHARYNX:no exudate, no erythema and lips, buccal mucosa, and tongue normal  NECK: supple, thyroid normal size, non-tender, without nodularity LYMPH:  no palpable lymphadenopathy in the cervical, axillary or inguinal LUNGS: clear to auscultation and percussion with normal breathing effort HEART: regular rate & rhythm and no murmurs and no lower extremity edema ABDOMEN:abdomen soft, non-tender and normal bowel sounds Musculoskeletal:no cyanosis of digits and no clubbing  NEURO: alert & oriented x 3 with fluent speech, no focal motor/sensory deficits  LABORATORY DATA:  I have reviewed the data as listed    Component Value Date/Time   NA 142 02/16/2015 1109   NA 140 02/05/2015 0800   K 3.4* 02/16/2015 1109   K 4.3 02/05/2015 0800   CL 109 02/05/2015 0800   CL 103 03/11/2013 1503   CO2 24 02/16/2015 1109   CO2 24 02/05/2015 0800   GLUCOSE 135 02/16/2015 1109   GLUCOSE 81 02/05/2015 0800   GLUCOSE 91 03/11/2013 1503   BUN 24.8 02/16/2015 1109   BUN 21* 02/05/2015 0800   CREATININE 1.0 02/16/2015 1109   CREATININE 0.80 02/05/2015 0800    CALCIUM 9.1 02/16/2015 1109   CALCIUM 8.8* 02/05/2015 0800   CALCIUM 11.4* 12/30/2014 0835   PROT 9.7* 02/16/2015 1109   PROT 7.9 02/05/2015 0800   ALBUMIN 2.5* 02/16/2015 1109   ALBUMIN 2.3* 02/05/2015 0800   AST 34 02/16/2015 1109   AST 42* 02/05/2015 0800   ALT 12 02/16/2015 1109   ALT 39 02/05/2015 0800   ALKPHOS 136 02/16/2015 1109   ALKPHOS 145* 02/05/2015 0800   BILITOT 0.67 02/16/2015 1109   BILITOT 0.6 02/05/2015 0800   GFRNONAA >60 02/05/2015 0800   GFRAA >60 02/05/2015 0800    No results found for: SPEP, UPEP  Lab Results  Component Value Date   WBC 6.3 02/16/2015   NEUTROABS 5.4 02/16/2015   HGB 9.8* 02/16/2015   HCT 31.8* 02/16/2015   MCV 92.2 02/16/2015   PLT 188 02/16/2015      Chemistry      Component Value Date/Time   NA 142 02/16/2015 1109   NA 140 02/05/2015 0800   K 3.4* 02/16/2015 1109   K 4.3 02/05/2015 0800   CL 109 02/05/2015 0800   CL 103 03/11/2013 1503   CO2 24 02/16/2015 1109   CO2 24  02/05/2015 0800   BUN 24.8 02/16/2015 1109   BUN 21* 02/05/2015 0800   CREATININE 1.0 02/16/2015 1109   CREATININE 0.80 02/05/2015 0800      Component Value Date/Time   CALCIUM 9.1 02/16/2015 1109   CALCIUM 8.8* 02/05/2015 0800   CALCIUM 11.4* 12/30/2014 0835   ALKPHOS 136 02/16/2015 1109   ALKPHOS 145* 02/05/2015 0800   AST 34 02/16/2015 1109   AST 42* 02/05/2015 0800   ALT 12 02/16/2015 1109   ALT 39 02/05/2015 0800   BILITOT 0.67 02/16/2015 1109   BILITOT 0.6 02/05/2015 0800       ASSESSMENT & PLAN:  Multiple myeloma in relapse She tolerated treatment poorly with side effects. I recommend she continue on Ninlaro and dexamethasone weekly. I recommend she hold off Revlimid for now and resume next week if her diarrhea resolved   Anemia in neoplastic disease This is likely anemia of chronic disease and related to relapsed multiple myeloma. The patient denies recent history of bleeding such as epistaxis, hematuria or hematochezia. She is  asymptomatic from the anemia. We will observe for now.  She does not require transfusion now.  I will transfuse 1 unit of blood whenever hemoglobin dropped to less than 8 g.      Dehydration She has signs of dehydration with tachycardia and mild hypotension. I recommend IV fluids today. She agreed to proceed.   Diarrhea due to drug Her diarrhea could be due to medication. I recommend holding off Revlimid until next week. I recommend round-the-clock Imodium. Today I will give her Lomotil. I will give her IV fluids for dehydration.  For her headache, I would prescribe tramadol as needed.   All questions were answered. The patient knows to call the clinic with any problems, questions or concerns. No barriers to learning was detected. I spent 30 minutes counseling the patient face to face. The total time spent in the appointment was 40 minutes and more than 50% was on counseling and review of test results     [facility], [place], MD 02/16/2015 12:46 PM

## 2015-02-16 NOTE — Patient Instructions (Signed)

## 2015-02-20 ENCOUNTER — Encounter (HOSPITAL_COMMUNITY): Payer: Self-pay

## 2015-02-23 ENCOUNTER — Other Ambulatory Visit (HOSPITAL_BASED_OUTPATIENT_CLINIC_OR_DEPARTMENT_OTHER): Payer: Medicare Other

## 2015-02-23 DIAGNOSIS — C9002 Multiple myeloma in relapse: Secondary | ICD-10-CM

## 2015-02-23 LAB — CBC & DIFF AND RETIC
BASO%: 0.3 % (ref 0.0–2.0)
Basophils Absolute: 0 10*3/uL (ref 0.0–0.1)
EOS ABS: 0 10*3/uL (ref 0.0–0.5)
EOS%: 0 % (ref 0.0–7.0)
HCT: 31.1 % — ABNORMAL LOW (ref 34.8–46.6)
HGB: 9.5 g/dL — ABNORMAL LOW (ref 11.6–15.9)
Immature Retic Fract: 12.9 % — ABNORMAL HIGH (ref 1.60–10.00)
LYMPH%: 15 % (ref 14.0–49.7)
MCH: 28.3 pg (ref 25.1–34.0)
MCHC: 30.5 g/dL — AB (ref 31.5–36.0)
MCV: 92.6 fL (ref 79.5–101.0)
MONO#: 0.3 10*3/uL (ref 0.1–0.9)
MONO%: 10.6 % (ref 0.0–14.0)
NEUT#: 2.2 10*3/uL (ref 1.5–6.5)
NEUT%: 74.1 % (ref 38.4–76.8)
PLATELETS: 250 10*3/uL (ref 145–400)
RBC: 3.36 10*6/uL — ABNORMAL LOW (ref 3.70–5.45)
RDW: 19 % — ABNORMAL HIGH (ref 11.2–14.5)
RETIC %: 1.95 % (ref 0.70–2.10)
RETIC CT ABS: 65.52 10*3/uL (ref 33.70–90.70)
WBC: 2.9 10*3/uL — ABNORMAL LOW (ref 3.9–10.3)
lymph#: 0.4 10*3/uL — ABNORMAL LOW (ref 0.9–3.3)

## 2015-02-23 LAB — COMPREHENSIVE METABOLIC PANEL (CC13)
ALK PHOS: 146 U/L (ref 40–150)
ALT: 11 U/L (ref 0–55)
AST: 21 U/L (ref 5–34)
Albumin: 2.5 g/dL — ABNORMAL LOW (ref 3.5–5.0)
Anion Gap: 13 mEq/L — ABNORMAL HIGH (ref 3–11)
BILIRUBIN TOTAL: 0.63 mg/dL (ref 0.20–1.20)
BUN: 16.4 mg/dL (ref 7.0–26.0)
CO2: 24 mEq/L (ref 22–29)
Calcium: 8.8 mg/dL (ref 8.4–10.4)
Chloride: 107 mEq/L (ref 98–109)
Creatinine: 0.8 mg/dL (ref 0.6–1.1)
EGFR: 81 mL/min/{1.73_m2} — ABNORMAL LOW (ref >90)
Glucose: 96 mg/dl (ref 70–140)
POTASSIUM: 3.7 meq/L (ref 3.5–5.1)
Sodium: 144 mEq/L (ref 136–145)
TOTAL PROTEIN: 8.2 g/dL (ref 6.4–8.3)

## 2015-02-23 LAB — HOLD TUBE, BLOOD BANK

## 2015-03-02 ENCOUNTER — Other Ambulatory Visit (HOSPITAL_BASED_OUTPATIENT_CLINIC_OR_DEPARTMENT_OTHER): Payer: Medicare Other

## 2015-03-02 ENCOUNTER — Other Ambulatory Visit: Payer: Medicare Other

## 2015-03-02 ENCOUNTER — Ambulatory Visit (HOSPITAL_BASED_OUTPATIENT_CLINIC_OR_DEPARTMENT_OTHER): Payer: Medicare Other | Admitting: Hematology and Oncology

## 2015-03-02 ENCOUNTER — Telehealth: Payer: Self-pay | Admitting: *Deleted

## 2015-03-02 ENCOUNTER — Encounter: Payer: Self-pay | Admitting: Hematology and Oncology

## 2015-03-02 ENCOUNTER — Telehealth: Payer: Self-pay | Admitting: Hematology and Oncology

## 2015-03-02 VITALS — BP 116/62 | HR 100 | Temp 97.9°F | Resp 18 | Ht 61.0 in | Wt 93.6 lb

## 2015-03-02 DIAGNOSIS — D630 Anemia in neoplastic disease: Secondary | ICD-10-CM

## 2015-03-02 DIAGNOSIS — D72819 Decreased white blood cell count, unspecified: Secondary | ICD-10-CM

## 2015-03-02 DIAGNOSIS — C9002 Multiple myeloma in relapse: Secondary | ICD-10-CM

## 2015-03-02 DIAGNOSIS — K521 Toxic gastroenteritis and colitis: Secondary | ICD-10-CM | POA: Diagnosis not present

## 2015-03-02 DIAGNOSIS — E46 Unspecified protein-calorie malnutrition: Secondary | ICD-10-CM

## 2015-03-02 LAB — COMPREHENSIVE METABOLIC PANEL (CC13)
ALK PHOS: 130 U/L (ref 40–150)
ALT: 10 U/L (ref 0–55)
AST: 33 U/L (ref 5–34)
Albumin: 2.5 g/dL — ABNORMAL LOW (ref 3.5–5.0)
Anion Gap: 10 mEq/L (ref 3–11)
BUN: 23.1 mg/dL (ref 7.0–26.0)
CALCIUM: 8.4 mg/dL (ref 8.4–10.4)
CHLORIDE: 104 meq/L (ref 98–109)
CO2: 26 mEq/L (ref 22–29)
Creatinine: 0.9 mg/dL (ref 0.6–1.1)
EGFR: 70 mL/min/{1.73_m2} — ABNORMAL LOW (ref >90)
Glucose: 124 mg/dl (ref 70–140)
Potassium: 3.5 mEq/L (ref 3.5–5.1)
SODIUM: 140 meq/L (ref 136–145)
Total Bilirubin: 0.73 mg/dL (ref 0.20–1.20)
Total Protein: 8 g/dL (ref 6.4–8.3)

## 2015-03-02 LAB — CBC & DIFF AND RETIC
BASO%: 0 % (ref 0.0–2.0)
BASOS ABS: 0 10*3/uL (ref 0.0–0.1)
EOS%: 0 % (ref 0.0–7.0)
Eosinophils Absolute: 0 10*3/uL (ref 0.0–0.5)
HCT: 32.2 % — ABNORMAL LOW (ref 34.8–46.6)
HGB: 10 g/dL — ABNORMAL LOW (ref 11.6–15.9)
Immature Retic Fract: 5.1 % (ref 1.60–10.00)
LYMPH%: 10.7 % — AB (ref 14.0–49.7)
MCH: 28.5 pg (ref 25.1–34.0)
MCHC: 31.1 g/dL — ABNORMAL LOW (ref 31.5–36.0)
MCV: 91.7 fL (ref 79.5–101.0)
MONO#: 0.2 10*3/uL (ref 0.1–0.9)
MONO%: 4.9 % (ref 0.0–14.0)
NEUT#: 2.6 10*3/uL (ref 1.5–6.5)
NEUT%: 84.4 % — ABNORMAL HIGH (ref 38.4–76.8)
Platelets: 158 10*3/uL (ref 145–400)
RBC: 3.51 10*6/uL — ABNORMAL LOW (ref 3.70–5.45)
RDW: 19.5 % — ABNORMAL HIGH (ref 11.2–14.5)
Retic %: 1.46 % (ref 0.70–2.10)
Retic Ct Abs: 51.25 10*3/uL (ref 33.70–90.70)
WBC: 3.1 10*3/uL — ABNORMAL LOW (ref 3.9–10.3)
lymph#: 0.3 10*3/uL — ABNORMAL LOW (ref 0.9–3.3)

## 2015-03-02 LAB — HOLD TUBE, BLOOD BANK

## 2015-03-02 MED ORDER — DEXAMETHASONE 4 MG PO TABS: 20.0000 mg | ORAL_TABLET | Freq: Every day | ORAL | Status: DC

## 2015-03-02 MED ORDER — IXAZOMIB CITRATE 4 MG PO CAPS: 4.0000 mg | ORAL_CAPSULE | ORAL | Status: DC

## 2015-03-02 NOTE — Assessment & Plan Note (Signed)
This is likely anemia of chronic disease and related to relapsed multiple myeloma. The patient denies recent history of bleeding such as epistaxis, hematuria or hematochezia. She is asymptomatic from the anemia. We will observe for now.  She does not require transfusion now.  I will transfuse 1 unit of blood whenever hemoglobin dropped to less than 8 g.

## 2015-03-02 NOTE — Assessment & Plan Note (Signed)
This is improving. Clinically, she does not appear to be dehydrated. Continue Lomotil as needed.

## 2015-03-02 NOTE — Assessment & Plan Note (Signed)
She has significant protein calorie malnutrition due to recent weight loss and diarrhea. For many reasons as stated above, I will hold Revlimid for now and encourage her to increase oral supplement as tolerated.

## 2015-03-02 NOTE — Assessment & Plan Note (Signed)
This is likely related to side effects of treatment. Continue to hold Revlimid. This is improving.

## 2015-03-02 NOTE — Assessment & Plan Note (Signed)
This has resolved. She will resume calcium and vitamin D in the future. I will continue Zometa infusion to start next month

## 2015-03-02 NOTE — Assessment & Plan Note (Addendum)
She tolerated treatment poorly. I recommend we continue on Ninlaro on days 1, 8 and 15 of cycle every 28 days along with weekly dexamethasone 20 mg on the day she takes Ninlaor. I recommend we restart Zometa when I see her back in July. I recommend we continue to hold Revlimid for now as she developed significant pancytopenia and diarrhea which I think be attributed to side effects of Revlimid.

## 2015-03-02 NOTE — Telephone Encounter (Signed)
Added appts...KJ gv sched and avs

## 2015-03-02 NOTE — Telephone Encounter (Signed)
[employer] notified that Revlimid is on hold for now

## 2015-03-02 NOTE — Progress Notes (Signed)
[place] OFFICE PROGRESS NOTE  Patient Care Team: Cassandria Anger, MD as PCP - General [place] Dragon, MD as Consulting Physician (Gastroenterology) Leonie Man, MD as Consulting Physician (Cardiology) Heath Lark, MD as Consulting Physician (Hematology and Oncology)  SUMMARY OF ONCOLOGIC HISTORY:   Multiple myeloma in relapse   12/30/2014 Initial Diagnosis Multiple myeloma in relapse   02/05/2015 Bone Marrow Biopsy BM biopsy was non-diagnostic. FISH was positive for loss of 17p and gain of chromosome 11   02/06/2015 -  Chemotherapy She was started on Ninlaro, dexamethasone and Revlimid.   02/16/2015 Adverse Reaction Revlimid was placed on hold due to uncontrolled diarrhea.    She was initially diagnosed in October, 2008. Initial treatment with oral Alkeran, prednisone, and Revlimid. Revlimid stopped early on due to a dysphoric reaction. She was treated for 6 months. She reached a stable plateau and had a long treatment free interval. Last cycle given in October 2009. Secondary to rising paraprotein levels, treatment was reinitiated with oral pomalidomide in February 2014. Initial dose 2 mg 21 days on 7 day rest. She tolerated this well and was escalated to 3 mg. There was a slow but steady improvement in her paraprotein levels. Due to various reasons, pomalidomide was held since 09/02/2013.   INTERVAL HISTORY: Please see below for problem oriented charting. She returns for further follow-up. She is slightly improving. Denies further weight loss. She continues to diarrhea but only 2 times a day. Denies nausea or vomiting. Denies bone pain. Denies recent infection.  REVIEW OF SYSTEMS:   Constitutional: Denies fevers, chills or abnormal weight loss Eyes: Denies blurriness of vision Ears, nose, mouth, throat, and face: Denies mucositis or sore throat Respiratory: Denies cough, dyspnea or wheezes Cardiovascular: Denies palpitation, chest discomfort or lower extremity  swelling Skin: Denies abnormal skin rashes Lymphatics: Denies new lymphadenopathy or easy bruising Neurological:Denies numbness, tingling or new weaknesses Behavioral/Psych: Mood is stable, no new changes  All other systems were reviewed with the patient and are negative.  I have reviewed the past medical history, past surgical history, social history and family history with the patient and they are unchanged from previous note.  ALLERGIES:  is allergic to amiodarone; amoxicillin; aspirin; atenolol; nortriptyline; latex; and zoloft.  MEDICATIONS:  Current Outpatient Prescriptions  Medication Sig Dispense Refill  . acyclovir (ZOVIRAX) 400 MG tablet Take 1 tablet (400 mg total) by mouth daily. 30 tablet 6  . amLODipine (NORVASC) 5 MG tablet TAKES 0.5 TABLET (2.5 MG TOTAL) BY MOUTH DAILY. 90 tablet 3  . dexamethasone (DECADRON) 4 MG tablet Take 5 tablets (20 mg total) by mouth daily. 60 tablet 0  . donepezil (ARICEPT) 5 MG tablet Take 1 tablet (5 mg total) by mouth at bedtime. (Patient not taking: Reported on 02/16/2015) 30 tablet 5  . ergocalciferol (VITAMIN D2) 50000 UNITS capsule Take 1 capsule (50,000 Units total) by mouth once a week. 6 capsule 0  . ixazomib citrate (NINLARO) 4 MG capsule Take 1 capsule (4 mg total) by mouth once a week. Take on an empty stomach 1hr before or 2hrs after food. Do not crush, chew or open. Take on days 1,8,15 and skip day 22 3 capsule 6  . lenalidomide (REVLIMID) 5 MG capsule Take 1 capsule (5 mg total) by mouth daily. (Patient not taking: Reported on 03/02/2015) 21 capsule 0  . LORazepam (ATIVAN) 1 MG tablet Take 1 tablet (1 mg total) by mouth every 8 (eight) hours as needed for anxiety. (Patient taking differently:  Take 0.5 mg by mouth every 8 (eight) hours as needed for anxiety. ) 60 tablet 3  . OVER THE COUNTER MEDICATION Take 2 capsules by mouth daily. Potassium supplement    . promethazine (PHENERGAN) 25 MG tablet Take 0.5 tablets (12.5 mg total) by mouth  every 6 (six) hours as needed for nausea. 30 tablet 3  . traMADol (ULTRAM) 50 MG tablet Take 1 tablet (50 mg total) by mouth every 6 (six) hours as needed. 60 tablet 0   No current facility-administered medications for this visit.   Facility-Administered Medications Ordered in Other Visits  Medication Dose Route Frequency Provider Last Rate Last Dose  . 0.9 %  sodium chloride infusion   Intravenous Once Heath Lark, MD        PHYSICAL EXAMINATION: ECOG PERFORMANCE STATUS: 2 - Symptomatic, <50% confined to bed  Filed Vitals:   03/02/15 1428  BP: 116/62  Pulse: 100  Temp: 97.9 F (36.6 C)  Resp: 18   Filed Weights   03/02/15 1428  Weight: 93 lb 9.6 oz (42.457 kg)    GENERAL:alert, no distress and comfortable. She appears thin and cachectic SKIN: skin color, texture, turgor are normal, no rashes or significant lesions EYES: normal, Conjunctiva are pink and non-injected, sclera clear Musculoskeletal:no cyanosis of digits and no clubbing  NEURO: alert & oriented x 3 with fluent speech, no focal motor/sensory deficits  LABORATORY DATA:  I have reviewed the data as listed    Component Value Date/Time   NA 140 03/02/2015 1402   NA 140 02/05/2015 0800   K 3.5 03/02/2015 1402   K 4.3 02/05/2015 0800   CL 109 02/05/2015 0800   CL 103 03/11/2013 1503   CO2 26 03/02/2015 1402   CO2 24 02/05/2015 0800   GLUCOSE 124 03/02/2015 1402   GLUCOSE 81 02/05/2015 0800   GLUCOSE 91 03/11/2013 1503   BUN 23.1 03/02/2015 1402   BUN 21* 02/05/2015 0800   CREATININE 0.9 03/02/2015 1402   CREATININE 0.80 02/05/2015 0800   CALCIUM 8.4 03/02/2015 1402   CALCIUM 8.8* 02/05/2015 0800   CALCIUM 11.4* 12/30/2014 0835   PROT 8.0 03/02/2015 1402   PROT 7.9 02/05/2015 0800   ALBUMIN 2.5* 03/02/2015 1402   ALBUMIN 2.3* 02/05/2015 0800   AST 33 03/02/2015 1402   AST 42* 02/05/2015 0800   ALT 10 03/02/2015 1402   ALT 39 02/05/2015 0800   ALKPHOS 130 03/02/2015 1402   ALKPHOS 145* 02/05/2015 0800    BILITOT 0.73 03/02/2015 1402   BILITOT 0.6 02/05/2015 0800   GFRNONAA >60 02/05/2015 0800   GFRAA >60 02/05/2015 0800    No results found for: SPEP, UPEP  Lab Results  Component Value Date   WBC 3.1* 03/02/2015   NEUTROABS 2.6 03/02/2015   HGB 10.0* 03/02/2015   HCT 32.2* 03/02/2015   MCV 91.7 03/02/2015   PLT 158 03/02/2015      Chemistry      Component Value Date/Time   NA 140 03/02/2015 1402   NA 140 02/05/2015 0800   K 3.5 03/02/2015 1402   K 4.3 02/05/2015 0800   CL 109 02/05/2015 0800   CL 103 03/11/2013 1503   CO2 26 03/02/2015 1402   CO2 24 02/05/2015 0800   BUN 23.1 03/02/2015 1402   BUN 21* 02/05/2015 0800   CREATININE 0.9 03/02/2015 1402   CREATININE 0.80 02/05/2015 0800      Component Value Date/Time   CALCIUM 8.4 03/02/2015 1402   CALCIUM 8.8* 02/05/2015 0800  CALCIUM 11.4* 12/30/2014 0835   ALKPHOS 130 03/02/2015 1402   ALKPHOS 145* 02/05/2015 0800   AST 33 03/02/2015 1402   AST 42* 02/05/2015 0800   ALT 10 03/02/2015 1402   ALT 39 02/05/2015 0800   BILITOT 0.73 03/02/2015 1402   BILITOT 0.6 02/05/2015 0800      ASSESSMENT & PLAN:  Multiple myeloma in relapse She tolerated treatment poorly. I recommend we continue on Ninlaro on days 1, 8 and 15 of cycle every 28 days along with weekly dexamethasone 20 mg on the day she takes Ninlaor. I recommend we restart Zometa when I see her back in July. I recommend we continue to hold Revlimid for now as she developed significant pancytopenia and diarrhea which I think be attributed to side effects of Revlimid.   Anemia in neoplastic disease This is likely anemia of chronic disease and related to relapsed multiple myeloma. The patient denies recent history of bleeding such as epistaxis, hematuria or hematochezia. She is asymptomatic from the anemia. We will observe for now.  She does not require transfusion now.  I will transfuse 1 unit of blood whenever hemoglobin dropped to less than 8  g.       Hypercalcemia This has resolved. She will resume calcium and vitamin D in the future. I will continue Zometa infusion to start next month   Leukopenia This is likely related to side effects of treatment. Continue to hold Revlimid. This is improving.   Diarrhea due to drug This is improving. Clinically, she does not appear to be dehydrated. Continue Lomotil as needed.   Protein calorie malnutrition She has significant protein calorie malnutrition due to recent weight loss and diarrhea. For many reasons as stated above, I will hold Revlimid for now and encourage her to increase oral supplement as tolerated.      Orders Placed This Encounter  Procedures  . SPEP & IFE with QIG    Standing Status: Future     Number of Occurrences:      Standing Expiration Date: 04/05/2016  . Kappa/lambda light chains    Standing Status: Future     Number of Occurrences:      Standing Expiration Date: 04/05/2016  . Beta 2 microglobulin, serum    Standing Status: Future     Number of Occurrences:      Standing Expiration Date: 04/05/2016   All questions were answered. The patient knows to call the clinic with any problems, questions or concerns. No barriers to learning was detected. I spent 30 minutes counseling the patient face to face. The total time spent in the appointment was 40 minutes and more than 50% was on counseling and review of test results     [facility], Victoria Shannahan, MD 03/02/2015 3:00 PM

## 2015-03-05 ENCOUNTER — Telehealth: Payer: Self-pay | Admitting: Hematology and Oncology

## 2015-03-05 NOTE — Telephone Encounter (Signed)
Lft msg for pt confirming labs on 06/10 were cancelled per MD staff msg... KJ

## 2015-03-07 ENCOUNTER — Encounter: Payer: Self-pay | Admitting: Skilled Nursing Facility1

## 2015-03-07 ENCOUNTER — Ambulatory Visit: Payer: Medicare Other | Admitting: Neurology

## 2015-03-07 ENCOUNTER — Ambulatory Visit (INDEPENDENT_AMBULATORY_CARE_PROVIDER_SITE_OTHER): Payer: Medicare Other | Admitting: Neurology

## 2015-03-07 ENCOUNTER — Encounter: Payer: Self-pay | Admitting: Neurology

## 2015-03-07 VITALS — BP 98/54 | HR 90 | Resp 16 | Wt 96.0 lb

## 2015-03-07 DIAGNOSIS — F03A0 Unspecified dementia, mild, without behavioral disturbance, psychotic disturbance, mood disturbance, and anxiety: Secondary | ICD-10-CM | POA: Insufficient documentation

## 2015-03-07 DIAGNOSIS — F0390 Unspecified dementia without behavioral disturbance: Secondary | ICD-10-CM | POA: Diagnosis not present

## 2015-03-07 MED ORDER — DONEPEZIL HCL 10 MG PO TABS: ORAL_TABLET | ORAL | Status: DC

## 2015-03-07 NOTE — Progress Notes (Signed)
Subjective:     Patient ID: Victoria [name], female   DOB: Jun 11, 1932, 81 y.o.   MRN: [id number]  HPI   Review of Systems     Objective:   Physical Exam To assist the pt in identifying dietary strategies to gain some lost wt back.    Assessment:     Pt was identified as being malnourished due to some lost wt. Pt was contacted via the telephone at [phone]. Pt was unable to be reached due to telephone in office not recognizing the area code as such.    Plan:     Dietitian will notify Ernestene Kiel CSO, RD,LDN of this.

## 2015-03-07 NOTE — Patient Instructions (Signed)
1. Start Aricept 10mg : take 1/2 tablet daily for 2 weeks, then increase to 1 tablet daily 2. Physical exercise and brain stimulation exercises (ie crossword puzzles, word search) are important for brain health 3. Follow-up in 3 months, call our office for any changes

## 2015-03-07 NOTE — Progress Notes (Signed)
NEUROLOGY CONSULTATION NOTE  DAYRIN STALLONE MRN: [id number] DOB: 08/24/1932  Referring provider: Dr. Lew Dawes Primary care provider: Dr. Lew Dawes  Reason for consult:  Memory loss  Dear Dr Alain Marion:  Thank you for your kind referral of Victoria [name] for consultation of the above symptoms. Although her history is well known to you, please allow me to reiterate it for the purpose of our medical record. The patient was accompanied to the clinic by her daughter who also provides collateral information. Records and images were personally reviewed where available.  HISTORY OF PRESENT ILLNESS: This is an 81 year old right-handed woman with a history of multiple myeloma in relapse, anemia, anxiety, presenting for evaluation of worsening memory. She reports her memory is good, and that "sometimes I ignore my daughter." She has been living with her daughter for 8 years, her daughter started noticing memory changes on and off for the past couple of years, however states she has also been dealing with a lot of health issues. She noticed that memory recently worsened, she would ask the same question repeatedly, or ask her daughter to remember something for her. She has forgotten how to use the toaster oven and burned food a couple of times. She has left things out of the fridge. She does not use the stove. She does not drive. Her daughter gives her her medications. She takes prn Ativan for anxiety, usually taking 0.59m-1mg daily. Her daughter is in charge of the bills. She denies any word-finding difficulties.  She reports having infrequent headaches, mostly if she gets up quickly, stating it occurs around once a month, resolving when she lies down. No associated nausea, vomiting, photo/phonophobia. Her daughter tells me she complains of a headache daily. She has occasional dizziness lasting for only a second or so. She was reporting right arm pain to her daughter but denies it to me. She  denies any diplopia, dysarthria, dysphagia, focal numbness/tingling/weakness, neck/back pain, anosmia, tremors. She has occasional constipation. She denies any difficulties with dressing and bathing independently. Her daughter denies any significant paranoia except for 4 years ago, she has a history of "psychotic breaks" where she would get delusional and stop eating or drinking, dissociating herself from the situation. Last episode was 3-4 years ago. She had a panic reaction after taking Zoloft, where she would not get out of the car last April.  I personally reviewed MRI brain without contrast done 12/30/14 which showed moderate diffuse atrophy and chronic microvascular disease, no acute changes. There were multiple bony lesions seen throughout the calvarium consistent with myeloma. She has a clivus lesions measuring 1.3cm in diameter.    Laboratory Data: Lab Results  Component Value Date   WBC 3.1* 03/02/2015   HGB 10.0* 03/02/2015   HCT 32.2* 03/02/2015   MCV 91.7 03/02/2015   PLT 158 03/02/2015     Chemistry      Component Value Date/Time   NA 140 03/02/2015 1402   NA 140 02/05/2015 0800   K 3.5 03/02/2015 1402   K 4.3 02/05/2015 0800   CL 109 02/05/2015 0800   CL 103 03/11/2013 1503   CO2 26 03/02/2015 1402   CO2 24 02/05/2015 0800   BUN 23.1 03/02/2015 1402   BUN 21* 02/05/2015 0800   CREATININE 0.9 03/02/2015 1402   CREATININE 0.80 02/05/2015 0800      Component Value Date/Time   CALCIUM 8.4 03/02/2015 1402   CALCIUM 8.8* 02/05/2015 0800   CALCIUM 11.4* 12/30/2014 06295  ALKPHOS 130 03/02/2015 1402   ALKPHOS 145* 02/05/2015 0800   AST 33 03/02/2015 1402   AST 42* 02/05/2015 0800   ALT 10 03/02/2015 1402   ALT 39 02/05/2015 0800   BILITOT 0.73 03/02/2015 1402   BILITOT 0.6 02/05/2015 0800     Lab Results  Component Value Date   TSH 0.762 12/30/2014   Lab Results  Component Value Date   VITAMINB12 >2000* 01/25/2015    PAST MEDICAL HISTORY: Past Medical History   Diagnosis Date  . Hypertension   . Insomnia   . Anxiety   . Anemia   . Multiple myeloma     In remission  . Diverticulosis 04/2001  . Perforation bowel   . Ringing in ears     Wears hearing aides to drown out   . Rectal bleeding 02/03/2014  . Anemia in chronic illness 01/24/2015    PAST SURGICAL HISTORY: Past Surgical History  Procedure Laterality Date  . Sigmoid resection / rectopexy  01/2011    perforation/stoma  . Colonoscopy    . Partial hysterectomy    . Cesarean section    . Colostomy takedown  06/27/11  . Cholecystectomy  2012    laparoscopic    MEDICATIONS: Current Outpatient Prescriptions on File Prior to Visit  Medication Sig Dispense Refill  . acyclovir (ZOVIRAX) 400 MG tablet Take 1 tablet (400 mg total) by mouth daily. 30 tablet 6  . amLODipine (NORVASC) 5 MG tablet TAKES 0.5 TABLET (2.5 MG TOTAL) BY MOUTH DAILY. 90 tablet 3  . dexamethasone (DECADRON) 4 MG tablet Take 5 tablets (20 mg total) by mouth daily. (Patient taking differently: Take 20 mg by mouth. Take 5 tablets once a week) 60 tablet 0  . ergocalciferol (VITAMIN D2) 50000 UNITS capsule Take 1 capsule (50,000 Units total) by mouth once a week. 6 capsule 0  . ixazomib citrate (NINLARO) 4 MG capsule Take 1 capsule (4 mg total) by mouth once a week. Take on an empty stomach 1hr before or 2hrs after food. Do not crush, chew or open. Take on days 1,8,15 and skip day 22 3 capsule 6  . LORazepam (ATIVAN) 1 MG tablet Take 1 tablet (1 mg total) by mouth every 8 (eight) hours as needed for anxiety. (Patient taking differently: Take 0.5 mg by mouth every 8 (eight) hours as needed for anxiety. ) 60 tablet 3  . OVER THE COUNTER MEDICATION Take 2 capsules by mouth daily. Potassium supplement    . promethazine (PHENERGAN) 25 MG tablet Take 0.5 tablets (12.5 mg total) by mouth every 6 (six) hours as needed for nausea. 30 tablet 3  . traMADol (ULTRAM) 50 MG tablet Take 1 tablet (50 mg total) by mouth every 6 (six) hours as  needed. (Patient not taking: Reported on 03/07/2015) 60 tablet 0   Current Facility-Administered Medications on File Prior to Visit  Medication Dose Route Frequency Provider Last Rate Last Dose  . 0.9 %  sodium chloride infusion   Intravenous Once Heath Lark, MD        ALLERGIES: Allergies  Allergen Reactions  . Amiodarone     rash  . Amoxicillin Hives and Itching  . Aspirin   . Atenolol     Hair loss  . Nortriptyline     agitation  . Latex Dermatitis  . Zoloft [Sertraline Hcl] Anxiety    Increased agitation    FAMILY HISTORY: Family History  Problem Relation Age of Onset  . Hypertension Other   . Prostate cancer Father   .  Colitis Neg Hx   . Esophageal cancer Neg Hx   . Stomach cancer Neg Hx     SOCIAL HISTORY: History   Social History  . Marital Status: Divorced    Spouse Name: N/A  . Number of Children: 2  . Years of Education: N/A   Occupational History  . Not on file.   Social History Main Topics  . Smoking status: Never Smoker   . Smokeless tobacco: Never Used  . Alcohol Use: No  . Drug Use: No  . Sexual Activity: No   Other Topics Concern  . Not on file   Social History Narrative   Divorced.  Lives with daughter.  Normally ambulates without assistance.    REVIEW OF SYSTEMS: Constitutional: No fevers, chills, or sweats, no generalized fatigue, change in appetite Eyes: No visual changes, double vision, eye pain Ear, nose and throat: No hearing loss, ear pain, nasal congestion, sore throat Cardiovascular: No chest pain, palpitations Respiratory:  No shortness of breath at rest or with exertion, wheezes GastrointestinaI: No nausea, vomiting, diarrhea, abdominal pain, fecal incontinence Genitourinary:  No dysuria, urinary retention or frequency Musculoskeletal:  No neck pain, back pain Integumentary: No rash, pruritus, skin lesions Neurological: as above Psychiatric: No depression, insomnia, anxiety Endocrine: No palpitations, fatigue,  diaphoresis, mood swings, change in appetite, change in weight, increased thirst Hematologic/Lymphatic:  No anemia, purpura, petechiae. Allergic/Immunologic: no itchy/runny eyes, nasal congestion, recent allergic reactions, rashes  PHYSICAL EXAM: Filed Vitals:   03/07/15 1302  BP: 98/54  Pulse: 90  Resp: 16   General: No acute distress Head:  Normocephalic/atraumatic Eyes: Fundoscopic exam shows bilateral sharp discs, no vessel changes, exudates, or hemorrhages Neck: supple, no paraspinal tenderness, full range of motion Back: No paraspinal tenderness Heart: regular rate and rhythm Lungs: Clear to auscultation bilaterally. Vascular: No carotid bruits. Skin/Extremities: No rash, no edema Neurological Exam: Mental status: alert and oriented to person, place, year, no dysarthria or aphasia, Fund of knowledge is appropriate.  Remote memory intact.  Attention and concentration are normal.    Able to name objects and repeat phrases.  MMSE - Mini Mental State Exam 03/07/2015  Orientation to time 3  Orientation to Place 4  Registration 3  Attention/ Calculation 5  Recall 0  Language- name 2 objects 2  Language- repeat 1  Language- follow 3 step command 3  Language- read & follow direction 1  Write a sentence 1  Copy design 1  Total score 24   Cranial nerves: CN I: not tested CN II: pupils equal, round and reactive to light, visual fields intact, fundi unremarkable. CN III, IV, VI:  full range of motion, no nystagmus, no ptosis CN V: facial sensation intact CN VII: upper and lower face symmetric CN VIII: hearing intact to finger rub CN IX, X: gag intact, uvula midline CN XI: sternocleidomastoid and trapezius muscles intact CN XII: tongue midline Bulk & Tone: normal, no fasciculations. Motor: 5/5 throughout with no pronator drift. Sensation: intact to light touch, cold, pin, vibration and joint position sense.  No extinction to double simultaneous stimulation.  Romberg test  negative Deep Tendon Reflexes: brisk +2 throughout, no ankle clonus Plantar responses: downgoing bilaterally Cerebellar: no incoordination on finger to nose testing  Gait: slow and cautious with cane, no ataxia Tremor: none  IMPRESSION: This is an 81 year old right-handed woman with a history of multiple myeloma in relapse, anxiety, anemia, presenting with worsening memory loss. Her MMSE today is 24/30, indicating mild cognitive impairment, possible  mild dementia. Her MRI brain shows diffuse atrophy, multifocal bone lesions consistent with myeloma. We discussed different causes of memory changes, TSH and B12 normal. She does have anxiety and takes prn Ativan up to twice daily, this can affect cognition as well, her daughter tries to minimize as much as possible. She may benefit from restarting Aricept, side effects and expectations from the medication were discussed. She will start 39m daily for 2 weeks, then increase to 160mdaily. We discussed the importance of physical exercise and brain stimulation exercises for brain health. She will follow-up in 3 months and knows to call our office for any changes.   Thank you for allowing me to participate in the care of this patient. Please do not hesitate to call for any questions or concerns.   KaEllouise NewerM.D.  CC: Dr. PlAlain Marion

## 2015-03-09 ENCOUNTER — Other Ambulatory Visit: Payer: Medicare Other

## 2015-03-09 ENCOUNTER — Ambulatory Visit (HOSPITAL_COMMUNITY): Payer: Medicare Other

## 2015-03-16 ENCOUNTER — Ambulatory Visit: Payer: Medicare Other | Admitting: Hematology and Oncology

## 2015-03-19 ENCOUNTER — Ambulatory Visit: Payer: Medicare Other | Admitting: Hematology and Oncology

## 2015-03-23 ENCOUNTER — Other Ambulatory Visit (HOSPITAL_BASED_OUTPATIENT_CLINIC_OR_DEPARTMENT_OTHER): Payer: Medicare Other

## 2015-03-23 DIAGNOSIS — C9002 Multiple myeloma in relapse: Secondary | ICD-10-CM

## 2015-03-23 LAB — CBC & DIFF AND RETIC
BASO%: 0.4 % (ref 0.0–2.0)
Basophils Absolute: 0 10*3/uL (ref 0.0–0.1)
EOS ABS: 0 10*3/uL (ref 0.0–0.5)
EOS%: 0 % (ref 0.0–7.0)
HEMATOCRIT: 32.5 % — AB (ref 34.8–46.6)
HEMOGLOBIN: 10.1 g/dL — AB (ref 11.6–15.9)
Immature Retic Fract: 10.4 % — ABNORMAL HIGH (ref 1.60–10.00)
LYMPH#: 0.4 10*3/uL — AB (ref 0.9–3.3)
LYMPH%: 15.6 % (ref 14.0–49.7)
MCH: 28.9 pg (ref 25.1–34.0)
MCHC: 31.1 g/dL — ABNORMAL LOW (ref 31.5–36.0)
MCV: 92.9 fL (ref 79.5–101.0)
MONO#: 0.3 10*3/uL (ref 0.1–0.9)
MONO%: 11.5 % (ref 0.0–14.0)
NEUT#: 1.9 10*3/uL (ref 1.5–6.5)
NEUT%: 72.5 % (ref 38.4–76.8)
Platelets: 194 10*3/uL (ref 145–400)
RBC: 3.5 10*6/uL — ABNORMAL LOW (ref 3.70–5.45)
RDW: 19.5 % — ABNORMAL HIGH (ref 11.2–14.5)
RETIC %: 1.29 % (ref 0.70–2.10)
Retic Ct Abs: 45.15 10*3/uL (ref 33.70–90.70)
WBC: 2.6 10*3/uL — ABNORMAL LOW (ref 3.9–10.3)

## 2015-03-23 LAB — COMPREHENSIVE METABOLIC PANEL (CC13)
ALBUMIN: 3.2 g/dL — AB (ref 3.5–5.0)
ALT: 12 U/L (ref 0–55)
ANION GAP: 7 meq/L (ref 3–11)
AST: 17 U/L (ref 5–34)
Alkaline Phosphatase: 111 U/L (ref 40–150)
BILIRUBIN TOTAL: 0.91 mg/dL (ref 0.20–1.20)
BUN: 10.6 mg/dL (ref 7.0–26.0)
CALCIUM: 9.7 mg/dL (ref 8.4–10.4)
CHLORIDE: 103 meq/L (ref 98–109)
CO2: 28 meq/L (ref 22–29)
Creatinine: 0.7 mg/dL (ref 0.6–1.1)
EGFR: 88 mL/min/{1.73_m2} — AB (ref >90)
GLUCOSE: 120 mg/dL (ref 70–140)
POTASSIUM: 4.3 meq/L (ref 3.5–5.1)
SODIUM: 138 meq/L (ref 136–145)
TOTAL PROTEIN: 8.2 g/dL (ref 6.4–8.3)

## 2015-03-26 ENCOUNTER — Other Ambulatory Visit: Payer: Self-pay

## 2015-03-27 LAB — KAPPA/LAMBDA LIGHT CHAINS
KAPPA FREE LGHT CHN: 1.9 mg/dL (ref 0.33–1.94)
KAPPA LAMBDA RATIO: 0.02 — AB (ref 0.26–1.65)
Lambda Free Lght Chn: 109 mg/dL — ABNORMAL HIGH (ref 0.57–2.63)

## 2015-03-27 LAB — SPEP & IFE WITH QIG
ABNORMAL PROTEIN BAND1: 2.2 g/dL
ALBUMIN ELP: 3.8 g/dL (ref 3.8–4.8)
Alpha-1-Globulin: 0.4 g/dL — ABNORMAL HIGH (ref 0.2–0.3)
Alpha-2-Globulin: 1 g/dL — ABNORMAL HIGH (ref 0.5–0.9)
BETA 2: 0.3 g/dL (ref 0.2–0.5)
Beta Globulin: 0.4 g/dL (ref 0.4–0.6)
Gamma Globulin: 2.4 g/dL — ABNORMAL HIGH (ref 0.8–1.7)
IGA: 45 mg/dL — AB (ref 69–380)
IGG (IMMUNOGLOBIN G), SERUM: 2400 mg/dL — AB (ref 690–1700)
IGM, SERUM: 8 mg/dL — AB (ref 52–322)
Total Protein, Serum Electrophoresis: 8.3 g/dL — ABNORMAL HIGH (ref 6.1–8.1)

## 2015-03-28 ENCOUNTER — Telehealth: Payer: Self-pay

## 2015-03-28 ENCOUNTER — Ambulatory Visit (INDEPENDENT_AMBULATORY_CARE_PROVIDER_SITE_OTHER): Payer: Medicare Other | Admitting: Internal Medicine

## 2015-03-28 ENCOUNTER — Encounter: Payer: Self-pay | Admitting: Internal Medicine

## 2015-03-28 VITALS — BP 110/52 | HR 93 | Ht 61.0 in | Wt 91.5 lb

## 2015-03-28 DIAGNOSIS — F062 Psychotic disorder with delusions due to known physiological condition: Secondary | ICD-10-CM | POA: Diagnosis not present

## 2015-03-28 DIAGNOSIS — I2510 Atherosclerotic heart disease of native coronary artery without angina pectoris: Secondary | ICD-10-CM

## 2015-03-28 DIAGNOSIS — I2584 Coronary atherosclerosis due to calcified coronary lesion: Secondary | ICD-10-CM

## 2015-03-28 DIAGNOSIS — R531 Weakness: Secondary | ICD-10-CM

## 2015-03-28 DIAGNOSIS — I10 Essential (primary) hypertension: Secondary | ICD-10-CM

## 2015-03-28 DIAGNOSIS — I493 Ventricular premature depolarization: Secondary | ICD-10-CM | POA: Diagnosis not present

## 2015-03-28 NOTE — Telephone Encounter (Signed)
Victoria [name] called stating pt is weak. And starting to shake, possibly from nerves. She does not want to take pt to ER if possible.

## 2015-03-28 NOTE — Progress Notes (Signed)
OFFICE NOTE  Chief Complaint:  Weakness, PVC's, CAC  Primary Care Physician: Walker Kehr, MD  HPI:  Victoria [name] is a pleasant 81 year old female who had previously seen in the hospital in 2012. Subsequently she was followed up by Dr. Ellyn Hack. At that time she underwent surgery for a perforated bowel and partial bowel obstruction. The time she was noted to be in atrial fibrillation and was on amiodarone for short period time. She did have side effects with that medicine was taken off of it and placed on atenolol. She's not had any recurrence of A. fib and was thought this was solely related to her significant illness. Recently she's been hospitalized again with a weakness and hypercalcemia as well as hypokalemia. Imaging in the past as demonstrated some scattered coronary artery calcium as well as abdominal aortic atherosclerosis. Her daughter, who is a natural pathic physician, is concerned about her weakness and feels that it may be related to coronary artery disease. Recently she was having a procedure and was noted to have 8-10 PVCs. This is considered a new finding and although Victoria [name] was asymptomatic, it was concerning for her daughter is possibly related to heart disease.  PMHx:  Past Medical History  Diagnosis Date  . Hypertension   . Insomnia   . Anxiety   . Anemia   . Multiple myeloma     In remission  . Diverticulosis 04/2001  . Perforation bowel   . Ringing in ears     Wears hearing aides to drown out   . Rectal bleeding 02/03/2014  . Anemia in chronic illness 01/24/2015    Past Surgical History  Procedure Laterality Date  . Sigmoid resection / rectopexy  01/2011    perforation/stoma  . Colonoscopy    . Partial hysterectomy    . Cesarean section    . Colostomy takedown  06/27/11  . Cholecystectomy  2012    laparoscopic    FAMHx:  Family History  Problem Relation Age of Onset  . Hypertension Other   . Prostate cancer Father   . Colitis Neg Hx   .  Esophageal cancer Neg Hx   . Stomach cancer Neg Hx     SOCHx:   reports that she has never smoked. She has never used smokeless tobacco. She reports that she does not drink alcohol or use illicit drugs.  ALLERGIES:  Allergies  Allergen Reactions  . Amiodarone     rash  . Amoxicillin Hives and Itching  . Aspirin   . Atenolol     Hair loss  . Nortriptyline     agitation  . Latex Dermatitis  . Zoloft [Sertraline Hcl] Anxiety    Increased agitation    ROS: A comprehensive review of systems was negative except for: Constitutional: positive for fatigue Cardiovascular: positive for irregular heart beat Neurological: positive for weakness Behavioral/Psych: positive for mood swings  HOME MEDS: Current Outpatient Prescriptions  Medication Sig Dispense Refill  . acyclovir (ZOVIRAX) 400 MG tablet Take 1 tablet (400 mg total) by mouth daily. 30 tablet 6  . amLODipine (NORVASC) 5 MG tablet TAKES 0.5 TABLET (2.5 MG TOTAL) BY MOUTH DAILY. 90 tablet 3  . dexamethasone (DECADRON) 4 MG tablet Take 5 tablets (20 mg total) by mouth daily. (Patient taking differently: Take 20 mg by mouth. Take 5 tablets once a week) 60 tablet 0  . donepezil (ARICEPT) 10 MG tablet Take 1/2 tablet daily for 2 weeks, then increase to 1 tablet daily 30 tablet  11  . ergocalciferol (VITAMIN D2) 50000 UNITS capsule Take 1 capsule (50,000 Units total) by mouth once a week. 6 capsule 0  . ixazomib citrate (NINLARO) 4 MG capsule Take 1 capsule (4 mg total) by mouth once a week. Take on an empty stomach 1hr before or 2hrs after food. Do not crush, chew or open. Take on days 1,8,15 and skip day 22 3 capsule 6  . LORazepam (ATIVAN) 1 MG tablet Take 1 tablet (1 mg total) by mouth every 8 (eight) hours as needed for anxiety. (Patient taking differently: Take 0.5 mg by mouth every 8 (eight) hours as needed for anxiety. ) 60 tablet 3  . OVER THE COUNTER MEDICATION Take 2 capsules by mouth daily. Potassium supplement    .  promethazine (PHENERGAN) 25 MG tablet Take 0.5 tablets (12.5 mg total) by mouth every 6 (six) hours as needed for nausea. 30 tablet 3  . traMADol (ULTRAM) 50 MG tablet Take 1 tablet (50 mg total) by mouth every 6 (six) hours as needed. 60 tablet 0   No current facility-administered medications for this visit.   Facility-Administered Medications Ordered in Other Visits  Medication Dose Route Frequency Provider Last Rate Last Dose  . 0.9 %  sodium chloride infusion   Intravenous Once Heath Lark, MD        LABS/IMAGING: No results found for this or any previous visit (from the past 48 hour(s)). No results found.  WEIGHTS: Wt Readings from Last 3 Encounters:  03/28/15 91 lb 8 oz (41.504 kg)  03/07/15 96 lb (43.545 kg)  03/02/15 93 lb 9.6 oz (42.457 kg)    VITALS: BP 110/52 mmHg  Pulse 93  Ht $R'5\' 1"'tQ$  (1.549 m)  Wt 91 lb 8 oz (41.504 kg)  BMI 17.30 kg/m2  EXAM: General appearance: alert and no distress Neck: no carotid bruit, no JVD and thyroid not enlarged, symmetric, no tenderness/mass/nodules Lungs: clear to auscultation bilaterally Heart: regular rate and rhythm, S1, S2 normal, no murmur, click, rub or gallop Abdomen: soft, non-tender; bowel sounds normal; no masses,  no organomegaly Extremities: extremities normal, atraumatic, no cyanosis or edema Pulses: 2+ and symmetric Skin: Skin color, texture, turgor normal. No rashes or lesions Neurologic: Mental status: Alert, oriented, thought content appropriate Motor: Strength 4 out of 5 on the left and 3 out of 5 on the right lower extremity, upper extremity strength and flexor strength 5 out of 5 bilaterally Psych: Pleasant  EKG: Normal sinus rhythm at 93, nonspecific T wave changes  ASSESSMENT: 1. History of procedurally related atrial fibrillation, not on anticoagulation  2. PVCs 3. Weakness 4. Coronary artery calcium  PLAN: 1.   Victoria [name] is been describing some weakness, particularly of her lower extremities. She has  been noted to have coronary artery disease with coronary artery calcium and abdominal aortic atherosclerosis seen on CT. Her PVCs may be related to ischemia and it would be reasonable to consider stress testing given her age and risk factors. I recommend a Lexiscan nuclear stress test as she has significant weakness and will not be able to walk on a treadmill. Plan to see her back to discuss those results in a few weeks.  Thanks again for the kind referral.  Pixie Casino, MD, [facility] Attending Cardiologist [place] 03/28/2015, 6:35 PM

## 2015-03-28 NOTE — Patient Instructions (Signed)
Your physician has requested that you have a lexiscan myoview. For further information please visit [email or web address]. Please follow instruction sheet, as given.  Your physician recommends that you schedule a follow-up appointment after your test with Dr. Debara Pickett.

## 2015-03-28 NOTE — Telephone Encounter (Signed)
Pt is weak, she uses her walker, daughter states her knees are bent more than usual and looks like they can give out. Pt states she is weak. Breathing is OK, no nausea, is eating and drinking, pt has a little bit of diarrhea. She does have an appt with cardiologist today at 1445. Encouraged her to keep that appt and Dr Debara Pickett can assess the pt. Bryson Ha was stating she was not sure pt can make it to that appt. Told her if she is too weak to make her cardiologist appt to take her to ER.

## 2015-03-30 ENCOUNTER — Ambulatory Visit (HOSPITAL_BASED_OUTPATIENT_CLINIC_OR_DEPARTMENT_OTHER): Payer: Medicare Other

## 2015-03-30 ENCOUNTER — Telehealth: Payer: Self-pay | Admitting: Hematology and Oncology

## 2015-03-30 ENCOUNTER — Ambulatory Visit (HOSPITAL_BASED_OUTPATIENT_CLINIC_OR_DEPARTMENT_OTHER): Payer: Medicare Other | Admitting: Hematology and Oncology

## 2015-03-30 VITALS — BP 126/45 | HR 77 | Temp 99.0°F | Resp 16 | Ht 61.0 in | Wt 100.4 lb

## 2015-03-30 DIAGNOSIS — D630 Anemia in neoplastic disease: Secondary | ICD-10-CM | POA: Diagnosis not present

## 2015-03-30 DIAGNOSIS — H109 Unspecified conjunctivitis: Secondary | ICD-10-CM

## 2015-03-30 DIAGNOSIS — C9002 Multiple myeloma in relapse: Secondary | ICD-10-CM | POA: Diagnosis not present

## 2015-03-30 DIAGNOSIS — D72819 Decreased white blood cell count, unspecified: Secondary | ICD-10-CM

## 2015-03-30 DIAGNOSIS — K521 Toxic gastroenteritis and colitis: Secondary | ICD-10-CM

## 2015-03-30 DIAGNOSIS — I493 Ventricular premature depolarization: Secondary | ICD-10-CM

## 2015-03-30 MED ORDER — TOBRAMYCIN 0.3 % OP SOLN: 1.0000 [drp] | OPHTHALMIC | Status: DC

## 2015-03-30 MED ORDER — ZOLEDRONIC ACID 4 MG/100ML IV SOLN
4.0000 mg | Freq: Once | INTRAVENOUS | Status: AC
Start: 2015-03-30 — End: 2015-03-30
  Administered 2015-03-30: 4 mg via INTRAVENOUS
  Filled 2015-03-30: qty 100

## 2015-03-30 MED ORDER — SODIUM CHLORIDE 0.9 % IV SOLN
Freq: Once | INTRAVENOUS | Status: AC
  Administered 2015-03-30: 16:00:00 via INTRAVENOUS

## 2015-03-30 NOTE — Assessment & Plan Note (Signed)
This is likely related to side effects of treatment. Plan to resume Revlimid but reduce frequency once a week. This is improving.

## 2015-03-30 NOTE — Progress Notes (Signed)
[place] OFFICE PROGRESS NOTE  Patient Care Team: Cassandria Anger, Victoria as PCP - General [place] Dragon, Victoria as Consulting Physician (Gastroenterology) Leonie [name], Victoria [name], Victoria [name]/2016 Bone Marrow Biopsy BM biopsy was non-diagnostic. FISH was positive for loss of 17p and gain of chromosome 11   02/06/2015 -  Chemotherapy She was started on Ninlaro, dexamethasone and Revlimid.   02/16/2015 Adverse Reaction Revlimid was placed on hold due to uncontrolled diarrhea.    INTERVAL HISTORY: Please see below for problem oriented charting. She returns for further follow-up. She is gaining weight. Denies new bone pain. Denies recent infection. The patient denies any recent signs or symptoms of bleeding such as spontaneous epistaxis, hematuria or hematochezia. She was seen by cardiologist for irregular PVC She continues to have intermittent diarrhea, controlled with Lomotil  REVIEW OF SYSTEMS:   Constitutional: Denies fevers, chills or abnormal weight loss Eyes: Denies blurriness of vision Ears, nose, mouth, throat, and face: Denies mucositis or sore throat Respiratory: Denies cough, dyspnea or wheezes Cardiovascular: Denies palpitation, chest discomfort or lower extremity swelling Skin: Denies abnormal skin rashes Lymphatics: Denies new lymphadenopathy or easy bruising Neurological:Denies numbness, tingling or new weaknesses Behavioral/Psych: Mood is stable, no new changes  All other systems were reviewed with the patient and are negative.  I have reviewed the past medical history, past surgical history, social history and family history with the patient and they are unchanged from previous note.  ALLERGIES:  is allergic to amiodarone; amoxicillin; aspirin;  atenolol; nortriptyline; latex; and zoloft.  MEDICATIONS:  Current Outpatient Prescriptions  Medication Sig Dispense Refill  . amLODipine (NORVASC) 5 MG tablet TAKES 0.5 TABLET (2.5 MG TOTAL) BY MOUTH DAILY. 90 tablet 3  . dexamethasone (DECADRON) 4 MG tablet Take 5 tablets (20 mg total) by mouth daily. (Patient taking differently: Take 20 mg by mouth. Take 5 tablets once a week) 60 tablet 0  . ergocalciferol (VITAMIN D2) 50000 UNITS capsule Take 1 capsule (50,000 Units total) by mouth once a week. 6 capsule 0  . ixazomib citrate (NINLARO) 4 MG capsule Take 1 capsule (4 mg total) by mouth once a week. Take on an empty stomach 1hr before or 2hrs after food. Do not crush, chew or open. Take on days 1,8,15 and skip day 22 3 capsule 6  . LORazepam (ATIVAN) 1 MG tablet Take 1 tablet (1 mg total) by mouth every 8 (eight) hours as needed for anxiety. (Patient taking differently: Take 0.5 mg by mouth every 8 (eight) hours as needed for anxiety. ) 60 tablet 3  . OVER THE COUNTER MEDICATION Take 2 capsules by mouth daily. Potassium supplement    . promethazine (PHENERGAN) 25 MG tablet Take 0.5 tablets (12.5 mg total) by mouth every 6 (six) hours as needed for nausea. 30 tablet 3  . traMADol (ULTRAM) 50 MG tablet Take 1 tablet (50 mg total) by mouth every 6 (six) hours as needed. 60 tablet 0  . tobramycin (TOBREX) 0.3 % ophthalmic solution Place 1 drop into the left eye every 4 (four) hours. 5 mL 0   No current facility-administered medications for this visit.   Facility-Administered Medications Ordered in Other Visits  Medication Dose Route Frequency Provider Last Rate Last Dose  . 0.9 %  sodium chloride infusion   Intravenous  Once Heath Lark, Victoria      . 0.9 %  sodium chloride infusion   Intravenous Once Heath Lark, Victoria      . zolendronic acid (ZOMETA) 4 mg in sodium chloride 0.9 % 100 mL IVPB  4 mg Intravenous Once Heath Lark, Victoria        PHYSICAL EXAMINATION: ECOG PERFORMANCE STATUS: 2 - Symptomatic,  <50% confined to bed  Filed Vitals:   03/30/15 1535  BP: 126/45  Pulse: 77  Temp: 99 F (37.2 C)  Resp: 16   Filed Weights   03/30/15 1535  Weight: 100 lb 6.4 oz (45.541 kg)    GENERAL:alert, no distress and comfortable. She looks thin SKIN: skin color, texture, turgor are normal, no rashes or significant lesions EYES: normal, Conjunctiva are pink and non-injected, sclera clear OROPHARYNX:no exudate, no erythema and lips, buccal mucosa, and tongue normal  NECK: supple, thyroid normal size, non-tender, without nodularity LYMPH:  no palpable lymphadenopathy in the cervical, axillary or inguinal LUNGS: clear to auscultation and percussion with normal breathing effort HEART: Occasional irregular heartbeat, no murmurs without edema in lower extremity  ABDOMEN:abdomen soft, non-tender and normal bowel sounds Musculoskeletal:no cyanosis of digits and no clubbing  NEURO: alert & oriented x 3 with fluent speech, no focal motor/sensory deficits  LABORATORY DATA:  I have reviewed the data as listed    Component Value Date/Time   NA 138 03/23/2015 1555   NA 140 02/05/2015 0800   K 4.3 03/23/2015 1555   K 4.3 02/05/2015 0800   CL 109 02/05/2015 0800   CL 103 03/11/2013 1503   CO2 28 03/23/2015 1555   CO2 24 02/05/2015 0800   GLUCOSE 120 03/23/2015 1555   GLUCOSE 81 02/05/2015 0800   GLUCOSE 91 03/11/2013 1503   BUN 10.6 03/23/2015 1555   BUN 21* 02/05/2015 0800   CREATININE 0.7 03/23/2015 1555   CREATININE 0.80 02/05/2015 0800   CALCIUM 9.7 03/23/2015 1555   CALCIUM 8.8* 02/05/2015 0800   CALCIUM 11.4* 12/30/2014 0835   PROT 8.2 03/23/2015 1555   PROT 7.9 02/05/2015 0800   ALBUMIN 3.2* 03/23/2015 1555   ALBUMIN 2.3* 02/05/2015 0800   AST 17 03/23/2015 1555   AST 42* 02/05/2015 0800   ALT 12 03/23/2015 1555   ALT 39 02/05/2015 0800   ALKPHOS 111 03/23/2015 1555   ALKPHOS 145* 02/05/2015 0800   BILITOT 0.91 03/23/2015 1555   BILITOT 0.6 02/05/2015 0800   GFRNONAA >60  02/05/2015 0800   GFRAA >60 02/05/2015 0800    No results found for: SPEP, UPEP  Lab Results  Component Value Date   WBC 2.6* 03/23/2015   NEUTROABS 1.9 03/23/2015   HGB 10.1* 03/23/2015   HCT 32.5* 03/23/2015   MCV 92.9 03/23/2015   PLT 194 03/23/2015      Chemistry      Component Value Date/Time   NA 138 03/23/2015 1555   NA 140 02/05/2015 0800   K 4.3 03/23/2015 1555   K 4.3 02/05/2015 0800   CL 109 02/05/2015 0800   CL 103 03/11/2013 1503   CO2 28 03/23/2015 1555   CO2 24 02/05/2015 0800   BUN 10.6 03/23/2015 1555   BUN 21* 02/05/2015 0800   CREATININE 0.7 03/23/2015 1555   CREATININE 0.80 02/05/2015 0800      Component Value Date/Time   CALCIUM 9.7 03/23/2015 1555   CALCIUM 8.8* 02/05/2015 0800   CALCIUM 11.4* 12/30/2014 0835   ALKPHOS 111 03/23/2015 1555   ALKPHOS 145* 02/05/2015  0800   AST 17 03/23/2015 1555   AST 42* 02/05/2015 0800   ALT 12 03/23/2015 1555   ALT 39 02/05/2015 0800   BILITOT 0.91 03/23/2015 1555   BILITOT 0.6 02/05/2015 0800      ASSESSMENT & PLAN:  Multiple myeloma in relapse She tolerated treatment poorly. I recommend we continue on Ninlaro on days 1, 8 and 15 of cycle every 28 days along with weekly dexamethasone 20 mg on the day she takes Ninlaro. I recommend we restart Zometa today. I recommend we resume Revlimid with only once weekly dose along with the rest of chemotherapy. If her leukopenia improving the future, we might be able to resume that the higher dose and more regular frequency Recent blood work showed that she is responding to treatment.  Anemia in neoplastic disease This is likely anemia of chronic disease and related to relapsed multiple myeloma. The patient denies recent history of bleeding such as epistaxis, hematuria or hematochezia. She is asymptomatic from the anemia. We will observe for now.  She does not require transfusion now.   Diarrhea due to drug This is improving. Clinically, she does not appear to  be dehydrated. Continue Lomotil as needed.    Leukopenia This is likely related to side effects of treatment. Plan to resume Revlimid but reduce frequency once a week. This is improving.    PVC's (premature ventricular contractions) I will defer management to cardiologist. Clinically, she does not appear to be symptomatic     Orders Placed This Encounter  Procedures  . SPEP & IFE with QIG    Standing Status: Future     Number of Occurrences:      Standing Expiration Date: 05/03/2016  . Kappa/lambda light chains    Standing Status: Future     Number of Occurrences:      Standing Expiration Date: 05/03/2016   All questions were answered. The patient knows to call the clinic with any problems, questions or concerns. No barriers to learning was detected. I spent 25 minutes counseling the patient face to face. The total time spent in the appointment was 30 minutes and more than 50% was on counseling and review of test results     [facility], [place], [place] 03/30/2015 4:22 PM

## 2015-03-30 NOTE — Assessment & Plan Note (Signed)
This is likely anemia of chronic disease and related to relapsed multiple myeloma. The patient denies recent history of bleeding such as epistaxis, hematuria or hematochezia. She is asymptomatic from the anemia. We will observe for now.  She does not require transfusion now.  

## 2015-03-30 NOTE — Assessment & Plan Note (Signed)
I will defer management to cardiologist. Clinically, she does not appear to be symptomatic

## 2015-03-30 NOTE — Patient Instructions (Signed)

## 2015-03-30 NOTE — Assessment & Plan Note (Signed)
This is improving. Clinically, she does not appear to be dehydrated. Continue Lomotil as needed.

## 2015-03-30 NOTE — Assessment & Plan Note (Addendum)
She tolerated treatment poorly. I recommend we continue on Ninlaro on days 1, 8 and 15 of cycle every 28 days along with weekly dexamethasone 20 mg on the day she takes Ninlaro. I recommend we restart Zometa today. I recommend we resume Revlimid with only once weekly dose along with the rest of chemotherapy. If her leukopenia improving the future, we might be able to resume that the higher dose and more regular frequency Recent blood work showed that she is responding to treatment.

## 2015-03-30 NOTE — Telephone Encounter (Signed)
Gave adn printed appt sched and avs for pt for JULY

## 2015-04-03 ENCOUNTER — Telehealth: Payer: Self-pay | Admitting: *Deleted

## 2015-04-03 NOTE — Telephone Encounter (Signed)
Notified [employer].

## 2015-04-03 NOTE — Telephone Encounter (Signed)
TC from [employer] regarding pt's Revlimid.  They want to know when patient will resume her Revlimid and at what dosage.  Their call back # is [phone], ext [phone]

## 2015-04-03 NOTE — Telephone Encounter (Signed)
dont know when yet

## 2015-04-09 ENCOUNTER — Telehealth: Payer: Self-pay | Admitting: Hematology and Oncology

## 2015-04-09 ENCOUNTER — Encounter: Payer: Self-pay | Admitting: Hematology and Oncology

## 2015-04-09 NOTE — Progress Notes (Signed)
I placed fmla form for Victoria [name] on desk of nurse for dr. Alvy Bimler

## 2015-04-09 NOTE — Telephone Encounter (Signed)
pt dtr called to r/s appt...done....pt aware of new d.t

## 2015-04-09 NOTE — Progress Notes (Signed)
I called Victoria [name] to let her know fmla forms are ready for pick at front desk with ms. wilma

## 2015-04-17 ENCOUNTER — Telehealth (HOSPITAL_COMMUNITY): Payer: Self-pay

## 2015-04-17 NOTE — Telephone Encounter (Signed)
Encounter complete. Pam did receive call to answer all questions in regards to Victoria [name] that pt will be receiving on Thursday.

## 2015-04-19 ENCOUNTER — Inpatient Hospital Stay (HOSPITAL_COMMUNITY): Admission: RE | Admit: 2015-04-19 | Payer: Medicare Other | Source: Ambulatory Visit

## 2015-04-20 ENCOUNTER — Other Ambulatory Visit (HOSPITAL_BASED_OUTPATIENT_CLINIC_OR_DEPARTMENT_OTHER): Payer: Medicare Other

## 2015-04-20 DIAGNOSIS — C9002 Multiple myeloma in relapse: Secondary | ICD-10-CM

## 2015-04-20 LAB — CBC & DIFF AND RETIC
BASO%: 0.5 % (ref 0.0–2.0)
Basophils Absolute: 0 10*3/uL (ref 0.0–0.1)
EOS%: 0.3 % (ref 0.0–7.0)
Eosinophils Absolute: 0 10*3/uL (ref 0.0–0.5)
HCT: 29.3 % — ABNORMAL LOW (ref 34.8–46.6)
HGB: 9.2 g/dL — ABNORMAL LOW (ref 11.6–15.9)
IMMATURE RETIC FRACT: 19.6 % — AB (ref 1.60–10.00)
LYMPH%: 13.1 % — ABNORMAL LOW (ref 14.0–49.7)
MCH: 29.7 pg (ref 25.1–34.0)
MCHC: 31.4 g/dL — ABNORMAL LOW (ref 31.5–36.0)
MCV: 94.5 fL (ref 79.5–101.0)
MONO#: 0.4 10*3/uL (ref 0.1–0.9)
MONO%: 11.3 % (ref 0.0–14.0)
NEUT%: 74.8 % (ref 38.4–76.8)
NEUTROS ABS: 2.9 10*3/uL (ref 1.5–6.5)
Platelets: 260 10*3/uL (ref 145–400)
RBC: 3.1 10*6/uL — AB (ref 3.70–5.45)
RDW: 18.4 % — ABNORMAL HIGH (ref 11.2–14.5)
RETIC CT ABS: 67.89 10*3/uL (ref 33.70–90.70)
Retic %: 2.19 % — ABNORMAL HIGH (ref 0.70–2.10)
WBC: 3.8 10*3/uL — AB (ref 3.9–10.3)
lymph#: 0.5 10*3/uL — ABNORMAL LOW (ref 0.9–3.3)

## 2015-04-20 LAB — COMPREHENSIVE METABOLIC PANEL (CC13)
ALT: 19 U/L (ref 0–55)
AST: 21 U/L (ref 5–34)
Albumin: 2.8 g/dL — ABNORMAL LOW (ref 3.5–5.0)
Alkaline Phosphatase: 68 U/L (ref 40–150)
Anion Gap: 5 mEq/L (ref 3–11)
BUN: 16.2 mg/dL (ref 7.0–26.0)
CALCIUM: 9.5 mg/dL (ref 8.4–10.4)
CO2: 27 mEq/L (ref 22–29)
Chloride: 107 mEq/L (ref 98–109)
Creatinine: 0.8 mg/dL (ref 0.6–1.1)
EGFR: 86 mL/min/{1.73_m2} — ABNORMAL LOW (ref >90)
GLUCOSE: 86 mg/dL (ref 70–140)
POTASSIUM: 4.4 meq/L (ref 3.5–5.1)
SODIUM: 139 meq/L (ref 136–145)
Total Bilirubin: 0.57 mg/dL (ref 0.20–1.20)
Total Protein: 8.8 g/dL — ABNORMAL HIGH (ref 6.4–8.3)

## 2015-04-24 LAB — SPEP & IFE WITH QIG
ABNORMAL PROTEIN BAND1: 2.7 g/dL
ALBUMIN ELP: 3.5 g/dL — AB (ref 3.8–4.8)
ALPHA-1-GLOBULIN: 0.5 g/dL — AB (ref 0.2–0.3)
Alpha-2-Globulin: 0.9 g/dL (ref 0.5–0.9)
BETA GLOBULIN: 0.4 g/dL (ref 0.4–0.6)
Beta 2: 0.3 g/dL (ref 0.2–0.5)
Gamma Globulin: 2.9 g/dL — ABNORMAL HIGH (ref 0.8–1.7)
IgA: 31 mg/dL — ABNORMAL LOW (ref 69–380)
IgG (Immunoglobin G), Serum: 3300 mg/dL — ABNORMAL HIGH (ref 690–1700)
IgM, Serum: 7 mg/dL — ABNORMAL LOW (ref 52–322)
TOTAL PROTEIN, SERUM ELECTROPHOR: 8.6 g/dL — AB (ref 6.1–8.1)

## 2015-04-24 LAB — KAPPA/LAMBDA LIGHT CHAINS
KAPPA FREE LGHT CHN: 1.56 mg/dL (ref 0.33–1.94)
KAPPA LAMBDA RATIO: 0.01 — AB (ref 0.26–1.65)
LAMBDA FREE LGHT CHN: 171 mg/dL — AB (ref 0.57–2.63)

## 2015-04-25 ENCOUNTER — Ambulatory Visit (HOSPITAL_COMMUNITY)
Admission: RE | Admit: 2015-04-25 | Discharge: 2015-04-25 | Disposition: A | Discharge status: Home / Self Care | Payer: Medicare Other | Source: Ambulatory Visit | Attending: Cardiovascular Disease | Admitting: Cardiovascular Disease

## 2015-04-25 DIAGNOSIS — I2510 Atherosclerotic heart disease of native coronary artery without angina pectoris: Secondary | ICD-10-CM | POA: Diagnosis not present

## 2015-04-25 DIAGNOSIS — I493 Ventricular premature depolarization: Secondary | ICD-10-CM | POA: Diagnosis not present

## 2015-04-25 DIAGNOSIS — I2584 Coronary atherosclerosis due to calcified coronary lesion: Secondary | ICD-10-CM

## 2015-04-25 DIAGNOSIS — R531 Weakness: Secondary | ICD-10-CM

## 2015-04-25 LAB — MYOCARDIAL PERFUSION IMAGING
CHL CUP RESTING HR STRESS: 63 {beats}/min
LVDIAVOL: 74 mL
LVSYSVOL: 26 mL
Peak HR: 82 {beats}/min
SDS: 0
SRS: 1
SSS: 1
TID: 0.96

## 2015-04-25 MED ORDER — AMINOPHYLLINE 25 MG/ML IV SOLN
75.0000 mg | Freq: Once | INTRAVENOUS | Status: AC
  Administered 2015-04-25: 75 mg via INTRAVENOUS

## 2015-04-25 MED ORDER — REGADENOSON 0.4 MG/5ML IV SOLN
0.4000 mg | Freq: Once | INTRAVENOUS | Status: AC
  Administered 2015-04-25: 0.4 mg via INTRAVENOUS

## 2015-04-25 MED ORDER — TECHNETIUM TC 99M SESTAMIBI GENERIC - CARDIOLITE
10.8000 | Freq: Once | INTRAVENOUS | Status: AC | PRN
  Administered 2015-04-25: 11 via INTRAVENOUS

## 2015-04-25 MED ORDER — TECHNETIUM TC 99M SESTAMIBI GENERIC - CARDIOLITE
30.2000 | Freq: Once | INTRAVENOUS | Status: AC | PRN
  Administered 2015-04-25: 30.2 via INTRAVENOUS

## 2015-04-26 ENCOUNTER — Encounter: Payer: Self-pay | Admitting: Internal Medicine

## 2015-04-26 ENCOUNTER — Ambulatory Visit (INDEPENDENT_AMBULATORY_CARE_PROVIDER_SITE_OTHER): Payer: Medicare Other | Admitting: Internal Medicine

## 2015-04-26 VITALS — BP 104/56 | HR 86 | Ht 61.0 in | Wt 100.2 lb

## 2015-04-26 DIAGNOSIS — F411 Generalized anxiety disorder: Secondary | ICD-10-CM

## 2015-04-26 DIAGNOSIS — R600 Localized edema: Secondary | ICD-10-CM

## 2015-04-26 DIAGNOSIS — I493 Ventricular premature depolarization: Secondary | ICD-10-CM | POA: Diagnosis not present

## 2015-04-26 NOTE — Patient Instructions (Signed)
Your physician has recommended you make the following change in your medication: STOP amlodipine  Dr. Debara Pickett recommends that you purchase knee-high compression stockings - 20-33mmHg  Your physician recommends that you schedule a follow-up appointment as needed.

## 2015-04-27 ENCOUNTER — Ambulatory Visit: Payer: Medicare Other | Admitting: Hematology and Oncology

## 2015-04-27 DIAGNOSIS — R600 Localized edema: Secondary | ICD-10-CM | POA: Insufficient documentation

## 2015-04-27 NOTE — Progress Notes (Signed)
OFFICE NOTE  Chief Complaint:  Follow-up nuclear stress test  Primary Care Physician: Walker Kehr, MD  HPI:  Victoria [name] is a pleasant 81 year old female who had previously seen in the hospital in 2012. Subsequently she was followed up by Dr. Ellyn Hack. At that time she underwent surgery for a perforated bowel and partial bowel obstruction. The time she was noted to be in atrial fibrillation and was on amiodarone for short period time. She did have side effects with that medicine was taken off of it and placed on atenolol. She's not had any recurrence of A. fib and was thought this was solely related to her significant illness. Recently she's been hospitalized again with a weakness and hypercalcemia as well as hypokalemia. Imaging in the past as demonstrated some scattered coronary artery calcium as well as abdominal aortic atherosclerosis. Her daughter, who is a natural pathic physician, is concerned about her weakness and feels that it may be related to coronary artery disease. Recently she was having a procedure and was noted to have 8-10 PVCs. This is considered a new finding and although Victoria [name] was asymptomatic, it was concerning for her daughter is possibly related to heart disease.  Victoria [name] returns today for follow-up of her nuclear stress test. This was negative for ischemia with preserved ejection fraction.  PMHx:  Past Medical History  Diagnosis Date  . Hypertension   . Insomnia   . Anxiety   . Anemia   . Multiple myeloma     In remission  . Diverticulosis 04/2001  . Perforation bowel   . Ringing in ears     Wears hearing aides to drown out   . Rectal bleeding 02/03/2014  . Anemia in chronic illness 01/24/2015    Past Surgical History  Procedure Laterality Date  . Sigmoid resection / rectopexy  01/2011    perforation/stoma  . Colonoscopy    . Partial hysterectomy    . Cesarean section    . Colostomy takedown  06/27/11  . Cholecystectomy  2012   laparoscopic    FAMHx:  Family History  Problem Relation Age of Onset  . Hypertension Other   . Prostate cancer Father   . Colitis Neg Hx   . Esophageal cancer Neg Hx   . Stomach cancer Neg Hx     SOCHx:   reports that she has never smoked. She has never used smokeless tobacco. She reports that she does not drink alcohol or use illicit drugs.  ALLERGIES:  Allergies  Allergen Reactions  . Amiodarone     rash  . Amoxicillin Hives and Itching  . Aspirin   . Atenolol     Hair loss  . Nortriptyline     agitation  . Latex Dermatitis  . Zoloft [Sertraline Hcl] Anxiety    Increased agitation    ROS: A comprehensive review of systems was negative.  HOME MEDS: Current Outpatient Prescriptions  Medication Sig Dispense Refill  . dexamethasone (DECADRON) 4 MG tablet Take 5 tablets (20 mg total) by mouth daily. (Patient taking differently: Take 20 mg by mouth. Take 5 tablets once a week) 60 tablet 0  . ergocalciferol (VITAMIN D2) 50000 UNITS capsule Take 1 capsule (50,000 Units total) by mouth once a week. 6 capsule 0  . ixazomib citrate (NINLARO) 4 MG capsule Take 1 capsule (4 mg total) by mouth once a week. Take on an empty stomach 1hr before or 2hrs after food. Do not crush, chew or open. Take on days  1,8,15 and skip day 22 3 capsule 6  . LORazepam (ATIVAN) 1 MG tablet Take 1 tablet (1 mg total) by mouth every 8 (eight) hours as needed for anxiety. (Patient taking differently: Take 0.5 mg by mouth every 8 (eight) hours as needed for anxiety. ) 60 tablet 3  . OVER THE COUNTER MEDICATION Take 2 capsules by mouth daily. Potassium supplement    . promethazine (PHENERGAN) 25 MG tablet Take 0.5 tablets (12.5 mg total) by mouth every 6 (six) hours as needed for nausea. 30 tablet 3  . tobramycin (TOBREX) 0.3 % ophthalmic solution Place 1 drop into the left eye every 4 (four) hours. 5 mL 0  . traMADol (ULTRAM) 50 MG tablet Take 1 tablet (50 mg total) by mouth every 6 (six) hours as needed.  60 tablet 0   No current facility-administered medications for this visit.   Facility-Administered Medications Ordered in Other Visits  Medication Dose Route Frequency Provider Last Rate Last Dose  . 0.9 %  sodium chloride infusion   Intravenous Once Heath Lark, MD        LABS/IMAGING: No results found for this or any previous visit (from the past 48 hour(s)). No results found.  WEIGHTS: Wt Readings from Last 3 Encounters:  04/26/15 100 lb 3.2 oz (45.45 kg)  04/25/15 100 lb (45.36 kg)  03/30/15 100 lb 6.4 oz (45.541 kg)    VITALS: BP 104/56 mmHg  Pulse 86  Ht _0  (1.549 m)  Wt 100 lb 3.2 oz (45.45 kg)  BMI 18.94 kg/m2  EXAM: Extremities: edema 1+ bilateral pitting  EKG: Deferred ASSESSMENT: 1. History of procedurally related atrial fibrillation, not on anticoagulation  2. PVCs 3. Weakness 4. Coronary artery calcium - negative nuclear stress test  PLAN: 1.   [name] has a negative nuclear stress test which is reassuring. I doubt an ischemic cause of her symptoms. She still has some weakness but is improving. Given the fact she has coronary artery calcium and aortic calcium she still needs good risk factor modification. Recently her blood pressures have been running low. I recommended that she discontinue her Norvasc which is only on low dose. In addition this may be contributing some leg swelling that she's having. Today in the office she had 1+ edema. I recommended bilateral knee-high 20-30 mmHg compression stockings. She may also benefit from low-dose diarrhetic if her blood pressure improves. Follow-up with her primary care provider. I'm happy to see her back as needed.  Pixie Casino, MD, [facility] Attending Cardiologist [place] C Daymon Hora 04/27/2015, 7:34 AM

## 2015-04-30 ENCOUNTER — Telehealth: Payer: Self-pay | Admitting: Hematology and Oncology

## 2015-04-30 ENCOUNTER — Ambulatory Visit (HOSPITAL_BASED_OUTPATIENT_CLINIC_OR_DEPARTMENT_OTHER): Payer: Medicare Other | Admitting: Hematology and Oncology

## 2015-04-30 ENCOUNTER — Encounter: Payer: Self-pay | Admitting: Hematology and Oncology

## 2015-04-30 VITALS — BP 149/60 | HR 71 | Temp 98.2°F | Resp 18 | Ht 61.0 in | Wt 104.0 lb

## 2015-04-30 DIAGNOSIS — T451X5A Adverse effect of antineoplastic and immunosuppressive drugs, initial encounter: Secondary | ICD-10-CM

## 2015-04-30 DIAGNOSIS — D6481 Anemia due to antineoplastic chemotherapy: Secondary | ICD-10-CM

## 2015-04-30 DIAGNOSIS — D701 Agranulocytosis secondary to cancer chemotherapy: Secondary | ICD-10-CM | POA: Diagnosis not present

## 2015-04-30 DIAGNOSIS — C9002 Multiple myeloma in relapse: Secondary | ICD-10-CM

## 2015-04-30 MED ORDER — LENALIDOMIDE 2.5 MG PO CAPS: ORAL_CAPSULE | ORAL | Status: DC

## 2015-04-30 NOTE — Assessment & Plan Note (Signed)
This is combination of anemia of chronic disease related to treatment. I will lower the dose of chemotherapy as above.

## 2015-04-30 NOTE — Assessment & Plan Note (Signed)
Unfortunately, since we held Revlimid, her serum protein electrophoresis, M spike and free light chains were worse. I recommend adding back Revlimid at a lower dose 2.5 mg daily for 21 days, 7 days off along with Ninlaro and dexamethasone. I will see her back next month for further assessment

## 2015-04-30 NOTE — Assessment & Plan Note (Signed)
This is likely due to recent treatment. The patient denies recent history of fevers, cough, chills, diarrhea or dysuria. She is asymptomatic from the leukopenia. I will observe for now.  I will add back Revlimid at a low dose. As soon as her daughter received to treatment, she will call me and we will bring her back for repeat blood work

## 2015-04-30 NOTE — Progress Notes (Signed)
[place] OFFICE PROGRESS NOTE  Patient Care Team: Victoria Anger, MD as PCP - General [place] Dragon, MD as Consulting Physician (Gastroenterology) Leonie Man, MD as Consulting Physician (Cardiology) Heath Lark, MD as Consulting Physician (Hematology and Oncology)  SUMMARY OF ONCOLOGIC HISTORY:   Multiple myeloma in relapse   12/30/2014 Initial Diagnosis Multiple myeloma in relapse   02/05/2015 Bone Marrow Biopsy BM biopsy was non-diagnostic. FISH was positive for loss of 17p and gain of chromosome 11   02/06/2015 -  Chemotherapy She was started on Ninlaro, dexamethasone and Revlimid.   02/16/2015 Adverse Reaction Revlimid was placed on hold due to uncontrolled diarrhea.    INTERVAL HISTORY: Please see below for problem oriented charting. She returns for further follow-up. She had very poor oral intake. She denies worsening bone pain. There were no reported recent confusion or infection.  REVIEW OF SYSTEMS:   Constitutional: Denies fevers, chills or abnormal weight loss Eyes: Denies blurriness of vision Ears, nose, mouth, throat, and face: Denies mucositis or sore throat Respiratory: Denies cough, dyspnea or wheezes Cardiovascular: Denies palpitation, chest discomfort or lower extremity swelling Gastrointestinal:  Denies nausea, heartburn or change in bowel habits Skin: Denies abnormal skin rashes Lymphatics: Denies new lymphadenopathy or easy bruising Neurological:Denies numbness, tingling or new weaknesses Behavioral/Psych: Mood is stable, no new changes  All other systems were reviewed with the patient and are negative.  I have reviewed the past medical history, past surgical history, social history and family history with the patient and they are unchanged from previous note.  ALLERGIES:  is allergic to amiodarone; amoxicillin; aspirin; atenolol; nortriptyline; latex; and zoloft.  MEDICATIONS:  Current Outpatient Prescriptions  Medication Sig  Dispense Refill  . dexamethasone (DECADRON) 4 MG tablet Take 5 tablets (20 mg total) by mouth daily. (Patient taking differently: Take 20 mg by mouth. Take 5 tablets once a week) 60 tablet 0  . ergocalciferol (VITAMIN D2) 50000 UNITS capsule Take 1 capsule (50,000 Units total) by mouth once a week. 6 capsule 0  . ixazomib citrate (NINLARO) 4 MG capsule Take 1 capsule (4 mg total) by mouth once a week. Take on an empty stomach 1hr before or 2hrs after food. Do not crush, chew or open. Take on days 1,8,15 and skip day 22 3 capsule 6  . LORazepam (ATIVAN) 1 MG tablet Take 1 tablet (1 mg total) by mouth every 8 (eight) hours as needed for anxiety. (Patient taking differently: Take 0.5 mg by mouth every 8 (eight) hours as needed for anxiety. ) 60 tablet 3  . OVER THE COUNTER MEDICATION Take 2 capsules by mouth daily. Potassium supplement    . promethazine (PHENERGAN) 25 MG tablet Take 0.5 tablets (12.5 mg total) by mouth every 6 (six) hours as needed for nausea. 30 tablet 3  . tobramycin (TOBREX) 0.3 % ophthalmic solution Place 1 drop into the left eye every 4 (four) hours. 5 mL 0  . traMADol (ULTRAM) 50 MG tablet Take 1 tablet (50 mg total) by mouth every 6 (six) hours as needed. 60 tablet 0  . lenalidomide (REVLIMID) 2.5 MG capsule Take 1 capsule daily for 21 days, then off 7 days 21 capsule 0   No current facility-administered medications for this visit.   Facility-Administered Medications Ordered in Other Visits  Medication Dose Route Frequency Provider Last Rate Last Dose  . 0.9 %  sodium chloride infusion   Intravenous Once Heath Lark, MD        PHYSICAL EXAMINATION: ECOG  PERFORMANCE STATUS: 2 - Symptomatic, <50% confined to bed  Filed Vitals:   04/30/15 1523  BP: 149/60  Pulse: 71  Temp: 98.2 F (36.8 C)  Resp: 18   Filed Weights   04/30/15 1523  Weight: 104 lb (47.174 kg)    GENERAL:alert, no distress and comfortable. She looks thin and cachectic SKIN: skin color, texture,  turgor are normal, no rashes or significant lesions EYES: normal, Conjunctiva are pink and non-injected, sclera clear OROPHARYNX:no exudate, no erythema and lips, buccal mucosa, and tongue normal  Musculoskeletal:no cyanosis of digits and no clubbing  NEURO: alert & oriented x 3 with fluent speech, no focal motor/sensory deficits  LABORATORY DATA:  I have reviewed the data as listed    Component Value Date/Time   NA 139 04/20/2015 1536   NA 140 02/05/2015 0800   K 4.4 04/20/2015 1536   K 4.3 02/05/2015 0800   CL 109 02/05/2015 0800   CL 103 03/11/2013 1503   CO2 27 04/20/2015 1536   CO2 24 02/05/2015 0800   GLUCOSE 86 04/20/2015 1536   GLUCOSE 81 02/05/2015 0800   GLUCOSE 91 03/11/2013 1503   BUN 16.2 04/20/2015 1536   BUN 21* 02/05/2015 0800   CREATININE 0.8 04/20/2015 1536   CREATININE 0.80 02/05/2015 0800   CALCIUM 9.5 04/20/2015 1536   CALCIUM 8.8* 02/05/2015 0800   CALCIUM 11.4* 12/30/2014 0835   PROT 8.8* 04/20/2015 1536   PROT 7.9 02/05/2015 0800   ALBUMIN 2.8* 04/20/2015 1536   ALBUMIN 2.3* 02/05/2015 0800   AST 21 04/20/2015 1536   AST 42* 02/05/2015 0800   ALT 19 04/20/2015 1536   ALT 39 02/05/2015 0800   ALKPHOS 68 04/20/2015 1536   ALKPHOS 145* 02/05/2015 0800   BILITOT 0.57 04/20/2015 1536   BILITOT 0.6 02/05/2015 0800   GFRNONAA >60 02/05/2015 0800   GFRAA >60 02/05/2015 0800    No results found for: SPEP, UPEP  Lab Results  Component Value Date   WBC 3.8* 04/20/2015   NEUTROABS 2.9 04/20/2015   HGB 9.2* 04/20/2015   HCT 29.3* 04/20/2015   MCV 94.5 04/20/2015   PLT 260 04/20/2015      Chemistry      Component Value Date/Time   NA 139 04/20/2015 1536   NA 140 02/05/2015 0800   K 4.4 04/20/2015 1536   K 4.3 02/05/2015 0800   CL 109 02/05/2015 0800   CL 103 03/11/2013 1503   CO2 27 04/20/2015 1536   CO2 24 02/05/2015 0800   BUN 16.2 04/20/2015 1536   BUN 21* 02/05/2015 0800   CREATININE 0.8 04/20/2015 1536   CREATININE 0.80 02/05/2015  0800      Component Value Date/Time   CALCIUM 9.5 04/20/2015 1536   CALCIUM 8.8* 02/05/2015 0800   CALCIUM 11.4* 12/30/2014 0835   ALKPHOS 68 04/20/2015 1536   ALKPHOS 145* 02/05/2015 0800   AST 21 04/20/2015 1536   AST 42* 02/05/2015 0800   ALT 19 04/20/2015 1536   ALT 39 02/05/2015 0800   BILITOT 0.57 04/20/2015 1536   BILITOT 0.6 02/05/2015 0800      ASSESSMENT & PLAN:  Multiple myeloma in relapse Unfortunately, since we held Revlimid, her serum protein electrophoresis, M spike and free light chains were worse. I recommend adding back Revlimid at a lower dose 2.5 mg daily for 21 days, 7 days off along with Ninlaro and dexamethasone. I will see her back next month for further assessment  Anemia due to chemotherapy This is combination  of anemia of chronic disease related to treatment. I will lower the dose of chemotherapy as above.  Leukopenia due to antineoplastic chemotherapy This is likely due to recent treatment. The patient denies recent history of fevers, cough, chills, diarrhea or dysuria. She is asymptomatic from the leukopenia. I will observe for now.  I will add back Revlimid at a low dose. As soon as her daughter received to treatment, she will call me and we will bring her back for repeat blood work   Orders Placed This Encounter  Procedures  . SPEP & IFE with QIG    Standing Status: Future     Number of Occurrences:      Standing Expiration Date: 06/03/2016  . Kappa/lambda light chains    Standing Status: Future     Number of Occurrences:      Standing Expiration Date: 06/03/2016   All questions were answered. The patient knows to call the clinic with any problems, questions or concerns. No barriers to learning was detected. I spent 30 minutes counseling the patient face to face. The total time spent in the appointment was 40 minutes and more than 50% was on counseling and review of test results     [facility], Amarilis Belflower, MD 04/30/2015 4:00 PM

## 2015-04-30 NOTE — Telephone Encounter (Signed)
Gave adn printed appt sched and avs for pt for Aug and SEpt

## 2015-05-01 ENCOUNTER — Other Ambulatory Visit: Payer: Self-pay | Admitting: *Deleted

## 2015-05-01 MED ORDER — LENALIDOMIDE 2.5 MG PO CAPS
ORAL_CAPSULE | ORAL | Status: DC
Start: 2015-05-01 — End: 2015-05-21

## 2015-05-08 ENCOUNTER — Emergency Department (HOSPITAL_COMMUNITY): Payer: Medicare Other

## 2015-05-08 ENCOUNTER — Emergency Department (HOSPITAL_COMMUNITY)
Admission: EM | Admit: 2015-05-08 | Discharge: 2015-05-09 | Disposition: A | Discharge status: Home / Self Care | Payer: Medicare Other | Attending: Emergency Medicine | Admitting: Emergency Medicine

## 2015-05-08 ENCOUNTER — Encounter (HOSPITAL_COMMUNITY): Payer: Self-pay | Admitting: *Deleted

## 2015-05-08 DIAGNOSIS — Z91040 Latex allergy status: Secondary | ICD-10-CM | POA: Diagnosis not present

## 2015-05-08 DIAGNOSIS — R42 Dizziness and giddiness: Secondary | ICD-10-CM | POA: Insufficient documentation

## 2015-05-08 DIAGNOSIS — Y92002 Bathroom of unspecified non-institutional (private) residence single-family (private) house as the place of occurrence of the external cause: Secondary | ICD-10-CM | POA: Diagnosis not present

## 2015-05-08 DIAGNOSIS — S79912A Unspecified injury of left hip, initial encounter: Secondary | ICD-10-CM | POA: Diagnosis present

## 2015-05-08 DIAGNOSIS — Z880 Allergy status to penicillin: Secondary | ICD-10-CM | POA: Insufficient documentation

## 2015-05-08 DIAGNOSIS — Z8669 Personal history of other diseases of the nervous system and sense organs: Secondary | ICD-10-CM | POA: Diagnosis not present

## 2015-05-08 DIAGNOSIS — W19XXXA Unspecified fall, initial encounter: Secondary | ICD-10-CM | POA: Diagnosis not present

## 2015-05-08 DIAGNOSIS — S329XXA Fracture of unspecified parts of lumbosacral spine and pelvis, initial encounter for closed fracture: Secondary | ICD-10-CM

## 2015-05-08 DIAGNOSIS — Z8579 Personal history of other malignant neoplasms of lymphoid, hematopoietic and related tissues: Secondary | ICD-10-CM | POA: Diagnosis not present

## 2015-05-08 DIAGNOSIS — S3289XA Fracture of other parts of pelvis, initial encounter for closed fracture: Secondary | ICD-10-CM | POA: Diagnosis not present

## 2015-05-08 DIAGNOSIS — I10 Essential (primary) hypertension: Secondary | ICD-10-CM | POA: Insufficient documentation

## 2015-05-08 DIAGNOSIS — Z79899 Other long term (current) drug therapy: Secondary | ICD-10-CM | POA: Insufficient documentation

## 2015-05-08 DIAGNOSIS — Y939 Activity, unspecified: Secondary | ICD-10-CM | POA: Diagnosis not present

## 2015-05-08 DIAGNOSIS — Z8719 Personal history of other diseases of the digestive system: Secondary | ICD-10-CM | POA: Diagnosis not present

## 2015-05-08 DIAGNOSIS — S6991XA Unspecified injury of right wrist, hand and finger(s), initial encounter: Secondary | ICD-10-CM | POA: Diagnosis not present

## 2015-05-08 DIAGNOSIS — Z862 Personal history of diseases of the blood and blood-forming organs and certain disorders involving the immune mechanism: Secondary | ICD-10-CM | POA: Insufficient documentation

## 2015-05-08 DIAGNOSIS — Y999 Unspecified external cause status: Secondary | ICD-10-CM | POA: Insufficient documentation

## 2015-05-08 LAB — CBG MONITORING, ED: GLUCOSE-CAPILLARY: 85 mg/dL (ref 65–99)

## 2015-05-08 NOTE — ED Notes (Signed)
Bed: VZ85 Expected date:  Expected time:  Means of arrival:  Comments: EMS  Elderly fall

## 2015-05-08 NOTE — ED Provider Notes (Signed)
CSN: [id number]     Arrival date & time 05/08/15  2052 History   First MD Initiated Contact with Patient 05/08/15 2201     Chief Complaint  Patient presents with  . Fall    (Consider location/radiation/quality/duration/timing/severity/associated sxs/prior Treatment) HPI Comments:  81 year old female with a history of hypertension, multiple myeloma, and anemia presents to the emergency department after an unwitnessed fall. Daughter lives with the patient and states that she left the home to go to the pharmacy. She states that she was gone for 20 minutes. She left her mom sleeping in her bed and came home to find her face up on the bathroom tile floor. Patient reported to EMS that she hit her head during her fall. Daughter states that patient c/o a feeling of lightheadedness prior to her fall. Patient was also complaining of left hip pain and pain to her right wrist. Patient is not on any blood thinners. She is sleeping comfortably at this time,  In no visible or audible discomfort. Daughter denies recent fevers. Patient has had no complaints of headache, chest pain, or shortness of breath. Daughter states that patient was ambulatory at home with her walker, per baseline, following her fall today.  Patient is a 81 y.o. female presenting with fall. The history is provided by the patient. No language interpreter was used.  Fall Associated symptoms include arthralgias. Pertinent negatives include no chest pain, nausea or vomiting.    Past Medical History  Diagnosis Date  . Hypertension   . Insomnia   . Anxiety   . Anemia   . Multiple myeloma     In remission  . Diverticulosis 04/2001  . Perforation bowel   . Ringing in ears     Wears hearing aides to drown out   . Rectal bleeding 02/03/2014  . Anemia in chronic illness 01/24/2015   Past Surgical History  Procedure Laterality Date  . Sigmoid resection / rectopexy  01/2011    perforation/stoma  . Colonoscopy    . Partial hysterectomy    .  Cesarean section    . Colostomy takedown  06/27/11  . Cholecystectomy  2012    laparoscopic   Family History  Problem Relation Age of Onset  . Hypertension Other   . Prostate cancer Father   . Colitis Neg Hx   . Esophageal cancer Neg Hx   . Stomach cancer Neg Hx    History  Substance Use Topics  . Smoking status: Never Smoker   . Smokeless tobacco: Never Used  . Alcohol Use: No   OB History    No data available      Review of Systems  Respiratory: Negative for shortness of breath.   Cardiovascular: Negative for chest pain.  Gastrointestinal: Negative for nausea and vomiting.  Musculoskeletal: Positive for arthralgias. Negative for back pain.  Neurological: Positive for light-headedness.  All other systems reviewed and are negative.   Allergies  Amiodarone; Amoxicillin; Aspirin; Atenolol; Nortriptyline; Latex; and Zoloft  Home Medications   Prior to Admission medications   Medication Sig Start Date End Date Taking? Authorizing Provider  cholecalciferol (VITAMIN D) 1000 UNITS tablet Take 2,000 Units by mouth daily.   Yes Historical Provider, MD  dexamethasone (DECADRON) 4 MG tablet Take 5 tablets (20 mg total) by mouth daily. 03/02/15  Yes Heath Lark, MD  ergocalciferol (VITAMIN D2) 50000 UNITS capsule Take 1 capsule (50,000 Units total) by mouth once a week. 01/05/15  Yes Aleksei Plotnikov V, MD  ixazomib citrate (NINLARO) 4  MG capsule Take 1 capsule (4 mg total) by mouth once a week. Take on an empty stomach 1hr before or 2hrs after food. Do not crush, chew or open. Take on days 1,8,15 and skip day 22 03/02/15  Yes Ni Gorsuch, MD  LORazepam (ATIVAN) 1 MG tablet Take 1 tablet (1 mg total) by mouth every 8 (eight) hours as needed for anxiety. Patient taking differently: Take 0.5 mg by mouth every 8 (eight) hours as needed for anxiety.  12/31/14  Yes Venetia Maxon Rama, MD  Multiple Vitamins-Minerals (MULTIVITAMIN & MINERAL PO) Take 1 tablet by mouth daily.   Yes Historical Provider,  MD  OVER THE COUNTER MEDICATION Take 2 capsules by mouth daily. Potassium supplement   Yes Historical Provider, MD  lenalidomide (REVLIMID) 2.5 MG capsule Take 1 capsule daily for 21 days, then off 7 days 05/01/15   Heath Lark, MD  promethazine (PHENERGAN) 25 MG tablet Take 0.5 tablets (12.5 mg total) by mouth every 6 (six) hours as needed for nausea. 02/05/15   Heath Lark, MD  tobramycin (TOBREX) 0.3 % ophthalmic solution Place 1 drop into the left eye every 4 (four) hours. 03/30/15   Heath Lark, MD  traMADol (ULTRAM) 50 MG tablet Take 1 tablet (50 mg total) by mouth every 6 (six) hours as needed. 02/16/15   Ni Gorsuch, MD   BP 158/67 mmHg  Pulse 90  Temp(Src) 98 F (36.7 C) (Oral)  Resp 18  SpO2 91%   Physical Exam  Constitutional: She is oriented to person, place, and time. She appears well-developed and well-nourished. No distress.  HENT:  Head: Normocephalic and atraumatic.  Mouth/Throat: Oropharynx is clear and moist. No oropharyngeal exudate.  No Battle sign or raccoons eyes. No scalp laceration.  Eyes: Conjunctivae and EOM are normal. Pupils are equal, round, and reactive to light. No scleral icterus.  Neck: Normal range of motion.  Cardiovascular: Normal rate, regular rhythm and intact distal pulses.   Pulmonary/Chest: Effort normal. No respiratory distress. She has no wheezes. She has no rales.  Respirations even and unlabored  Abdominal: Soft. She exhibits no distension. There is no tenderness. There is no rebound.  Soft, nontender abdomen  Musculoskeletal: Normal range of motion.  No pelvic instability. No bony deformity or crepitus. No leg shortening or malrotation b/l.   Neurological: She is alert and oriented to person, place, and time. She exhibits normal muscle tone. Coordination normal.  GCS 15. No focal neurologic deficits appreciated.  Skin: Skin is warm and dry. No rash noted. She is not diaphoretic. No erythema. No pallor.  Psychiatric: She has a normal mood and affect.  Her behavior is normal.  Nursing note and vitals reviewed.   ED Course  Procedures (including critical care time) Labs Review Labs Reviewed  CBG MONITORING, ED    Imaging Review Ct Head Wo Contrast  05/08/2015   CLINICAL DATA:  Pain following fall.  Known multiple myeloma.  EXAM: CT HEAD WITHOUT CONTRAST  TECHNIQUE: Contiguous axial images were obtained from the base of the skull through the vertex without intravenous contrast.  COMPARISON:  Head CT and brain MRI December 30, 2014  FINDINGS: Mild diffuse atrophy is stable. There is no intracranial mass, hemorrhage, extra-axial fluid collection, or midline shift. Small vessel disease in the centra semiovale bilaterally appear stable. No new gray-white compartment lesions. No acute infarct apparent.  Multiple lytic lesions consistent with multiple myeloma are noted throughout the calvarium, stable. A lytic lesion is noted in the right clivus region, stable.  Mastoid air cells bilaterally are clear.  IMPRESSION: Multiple lytic bone lesions consistent with multiple myeloma, stable. Atrophy with supratentorial small vessel disease is stable. No intracranial mass, hemorrhage, or extra-axial fluid collection. No demonstrable acute infarct.   Electronically Signed   By: Lowella Grip III M.D.   On: 05/08/2015 22:57   Dg Shoulder Left  05/08/2015   CLINICAL DATA:  Left shoulder pain, fall in bathroom  EXAM: LEFT SHOULDER - 2+ VIEW  COMPARISON:  None.  FINDINGS: Two views of the left shoulder submitted. No acute fracture or subluxation. Degenerative changes AC joint. Inferior spurring of acromion. Minimal degenerative changes glenohumeral joint.  IMPRESSION: No acute fracture or subluxation. Degenerative changes as described above.   Electronically Signed   By: Lahoma Crocker M.D.   On: 05/08/2015 21:45   Dg Hip Unilat With Pelvis 2-3 Views Left  05/08/2015   CLINICAL DATA:  Status post fall in bathroom, with left hip pain. Initial encounter.  EXAM: DG HIP (WITH OR  WITHOUT PELVIS) 2-3V LEFT  COMPARISON:  CT of the abdomen and pelvis performed 02/22/2011, and abdominal radiograph performed 02/23/2011  FINDINGS: There is mild cortical irregularity involving the left superior pubic ramus, suspicious for an acute left superior pubic ramus fracture. A left inferior pubic ramus fracture may also be present.  Both femoral heads are seated normally within their respective acetabula. The proximal left femur appears intact. No significant degenerative change is appreciated. The sacroiliac joints are unremarkable in appearance. A large lytic lesion at the right iliac wing reflects the patient's known multiple myeloma, more prominent than in 2012. Tiny lucencies within the visualized osseous structures likely also reflect the patient's multiple myeloma.  The visualized bowel gas pattern is grossly unremarkable in appearance. Scattered phleboliths are noted within the pelvis.  IMPRESSION: 1. Mild cortical irregularity involving the left superior pubic ramus, suspicious for an acute left superior pubic ramus fracture. Left inferior pubic ramus fracture is also suspected. 2. Increasing size of large lytic lesion at the right iliac wing, reflecting the patient's known multiple myeloma. Scattered tiny lucencies within the visualized osseous structures likely also reflect multiple myeloma.   Electronically Signed   By: Garald Balding M.D.   On: 05/08/2015 21:52     EKG Interpretation   Date/Time:  Tuesday May 08 2015 22:16:02 EDT Ventricular Rate:  93 PR Interval:  150 QRS Duration: 84 QT Interval:  332 QTC Calculation: 413 R Axis:   47 Text Interpretation:  Sinus rhythm Ventricular premature complex Consider  left ventricular hypertrophy ED PHYSICIAN INTERPRETATION AVAILABLE IN [facility] Confirmed by TEST, Record ([id number]) on 05/09/2015 7:04:33 AM      2345 - Orders for Home Health/PT/OT placed for Care Management in AM MDM   Final diagnoses:  Pelvic fracture,  closed, initial encounter  Fall, initial encounter    81 year old female presents to the emergency department for further evaluation of injuries following a fall at home. Patient is afebrile and hemodynamically stable. She is sleeping comfortably. When awoken, patient has a reassuring physical examination. No focal neurologic deficits appreciated. No leg shortening or malrotation. No evidence of pelvic instability. No significant trauma to head or scalp appreciated.  Patient had a CT head, left shoulder x-ray, and hip x-ray completed. X-ray of hip is suspect for a left superior pubic rami and left inferior pubic rami fracture. Patient has been weightbearing since the fall with her walker; she uses a walker at baseline. Do not believe surgical intervention or admission is  indicated for management of patient's fracture. Will refer to outpatient orthopedics as well as order home health/PT/OT for further management. Patient has prescription for tramadol at home which she may take for pain. Return precautions given at discharge. Family agreeable to plan with no unaddressed concerns.   Filed Vitals:   05/08/15 2058 05/08/15 2322 05/08/15 2345  BP: 158/67 132/67 150/73  Pulse: 90 91 97  Temp: 98 F (36.7 C)    TempSrc: Oral    Resp: $Remo'18 15 15  'fXrnm$ SpO2: 91% 94% 94%     Antonietta Breach, PA-C 05/10/15 0831  Daleen Bo, MD 05/12/15 [phone]

## 2015-05-08 NOTE — Discharge Instructions (Signed)
Orders for Home Health have been placed. Care Management should start coordinating this care for you in the morning. Follow up with your primary care doctor and an orthopedic doctor for further evaluation of your injuries. Take your home Tramadol as needed for pain control. Return if symptoms worsen.  Stable Pelvic Fracture You have one or more fractures (this means there is a break in the bones) of the pelvis. The pelvis is the ring of bones that make up your hipbones. These are the bones you sit on and the lower part of the spine. It is like a boney ring where your legs attach and which supports your upper body. You have an undisplaced fracture. This means the bones are in good position. The pelvic fracture you have is a simple (uncomplicated) fracture. DIAGNOSIS  X-rays usually diagnose these fractures. TREATMENT  The goal of treating pelvic fractures is to get the bones to heal in a good position. The patient should return to normal activities as soon as possible. Such fractures are often treated with normal bed rest and conservative measures.  HOME CARE INSTRUCTIONS   You should be on bed rest for as long as directed by your caregiver. Change positions of your legs every 1-2 hours to maintain good blood flow. You may sit as long as is tolerable. Following this, you may do usual activities, but avoid strenuous activities for as long as directed by your caregiver.  Only take over-the-counter or prescription medicines for pain, discomfort, or fever as directed by your caregiver.  Bed rest may also be used for discomfort.  Resume your activities when you are able. Use a cane or crutch on the injured side to reduce pain while walking, as needed.  If you develop increased pain or discomfort not relieved with medications, contact your caregiver.  Warning: Do not drive a car or operate a motor vehicle until your caregiver specifically tells you it is safe to do so. SEEK IMMEDIATE MEDICAL CARE IF:     You feel light-headed or faint, develop chest pain or shortness of breath.  An unexplained oral temperature above 102 F (38.9 C) develops.  You develop blood in the urine or in the stools.  There is difficulty urinating, and/or having a bowel movement, or pain with these efforts.  There is a difficulty or increased pain with walking.  There is swelling in one or both legs that is not normal. Document Released: 11/24/2001 Document Revised: 01/30/2014 Document Reviewed: 04/28/2008 [facility] Patient Information 2015 [place], [place]. This information is not intended to replace advice given to you by your health care provider. Make sure you discuss any questions you have with your health care provider.

## 2015-05-08 NOTE — ED Notes (Signed)
Daughter lives with the patient and  states patient has not really slept since yesterday. Patient is sleeping at this time.

## 2015-05-08 NOTE — ED Notes (Signed)
Patient transported to X-ray 

## 2015-05-08 NOTE — ED Notes (Signed)
Patient had an unwitnessed fall at home in her bathroom. She complains of left sided hip pain and right wrist soreness. Small hematoma on the back of the head per EMS. Patient is not on any blood thinners. No obvious deformities noted and no neurological deficits found by EMS. Patient walked to the couch at home with assistance at home. Patient has intermittent confusion normally per her daughter's report.

## 2015-05-08 NOTE — ED Provider Notes (Signed)
  Face-to-face evaluation   History: Elderly female, fell, while at home with her daughter. Injury to left hip, and back of head. Patient has not been sleeping well.  Physical exam: Alert, elderly female. Mild left hip pain with range of motion. Patient is alert and responsive. She is confused.  Medical screening examination/treatment/procedure(s) were conducted as a shared visit with non-physician practitioner(s) and myself.  I personally evaluated the patient during the encounter  Daleen Bo, MD 05/12/15 [phone]

## 2015-05-09 ENCOUNTER — Telehealth: Payer: Self-pay

## 2015-05-09 DIAGNOSIS — S329XXD Fracture of unspecified parts of lumbosacral spine and pelvis, subsequent encounter for fracture with routine healing: Secondary | ICD-10-CM

## 2015-05-09 NOTE — Telephone Encounter (Signed)
Patients daughter, Ebony Hail called to let you know that patient fell and went to [facility] long ED and has a left pelvic fx--she has difficulty walking and the ED personnel told her patient will

## 2015-05-09 NOTE — Telephone Encounter (Signed)
Need a follow up with you (scheduled for Friday this week), but daughter is requesting PT from [place] at least until patient comes for office visit on Friday---i could not find any advisedment on weight bearing in ED notes----please advise, thanks

## 2015-05-10 ENCOUNTER — Ambulatory Visit (INDEPENDENT_AMBULATORY_CARE_PROVIDER_SITE_OTHER): Payer: Medicare Other | Admitting: Internal Medicine

## 2015-05-10 VITALS — BP 148/72 | HR 105 | Temp 98.8°F | Resp 16

## 2015-05-10 DIAGNOSIS — G4700 Insomnia, unspecified: Secondary | ICD-10-CM | POA: Diagnosis not present

## 2015-05-10 DIAGNOSIS — M6281 Muscle weakness (generalized): Secondary | ICD-10-CM

## 2015-05-10 DIAGNOSIS — C9002 Multiple myeloma in relapse: Secondary | ICD-10-CM | POA: Diagnosis not present

## 2015-05-10 DIAGNOSIS — R627 Adult failure to thrive: Secondary | ICD-10-CM | POA: Diagnosis not present

## 2015-05-10 NOTE — Telephone Encounter (Signed)
Done. Thx.

## 2015-05-10 NOTE — Progress Notes (Signed)
Pre visit review using our clinic review tool, if applicable. No additional management support is needed unless otherwise documented below in the visit note. 

## 2015-05-10 NOTE — Patient Instructions (Signed)
To prevent sleep dysfunction follow these instructions for sleep hygiene. Do not read, watch TV, or eat in bed. Do not get into bed until you are ready to turn off the light &  to go to sleep. Do not ingest stimulants ( decongestants, diet pills, nicotine, caffeine) after the evening meal.  The Home Health  referrals will be verified.

## 2015-05-10 NOTE — Addendum Note (Signed)
Addended by: Cassandria Anger on: 05/10/2015 01:06 PM   Modules accepted: Orders

## 2015-05-10 NOTE — Progress Notes (Signed)
   Subjective:    Patient ID: Victoria [name], female    DOB: 1932-08-05, 81 y.o.   MRN: [id number]  HPI The emergency room records 05/08/15 were reviewed. She fell while her daughter was at the pharmacy. She was unsupervised for 20 minutes. She was found in the bathroom lying on her back. Apparently she may been lightheaded before she fell according to ER records . She denies that @ this appointment.  She did complain of pain in the left shoulder, right wrist, and left hip. Imaging revealed no acute CNS process. She did have stable cerebralatrophy and multiple myeloma lesions in the skull. There is a suggestion of possible acute left superior pubic ramus and left inferior pubic ramus fractures. She is to be seen by an Orthopedist as an outpatient.  She is on multiple meds for MM including oral Decadron from her Hematologist. She is no longer on 50,000 units of vitamin D weekly. Lorazepam and Phenergan are listed as needed.   Review of Systems  Denied were any change in heart rhythm or rate prior to the event. There was no associated chest pain or shortness of breath .  Also specifically denied prior to the episode were headache, limb weakness, tingling, or numbness. No seizure activity noted.  Sleep pattern is erratic and she often sleeps during the day.    Objective:   Physical Exam  Pertinent or positive findings include: She is markedly frail and appears suboptimally nourished. She sits leaning to the left in the wheelchair. She has visible atrophy of the limbs. Resting OD exotropia.Upper partial present. Bilateral hearing aids are noted. Breath sounds are decreased. She has 1+ edema. Posterior tibial pulses are decreased. Strength assessed by grip is fair. She has weakness to opposition in the lower extremities. The testing was not associated with hip/pelvis pain. She gave the date is May 16, 2015. She identified Associate Professor.  General appearance : in no distress.  Oral exam:  Lips  and gums are healthy appearing.There is no oropharyngeal erythema or exudate noted.   Heart:  Slight tachycardia; regular rhythm. S1 and S2 normal without gallop, murmur, click, rub or other extra sounds    Lungs:Chest clear to auscultation; no wheezes, rhonchi,rales ,or rubs present.No increased work of breathing.   Abdomen: bowel sounds normal, soft and non-tender without masses, organomegaly or hernias noted.  No guarding or rebound.   Vascular : all pulses equal ; no bruits present.  Skin:Warm & dry.  Intact without suspicious lesions or rashes ; no tenting    Lymphatic: No lymphadenopathy is noted about the head, neck, axilla.      Assessment & Plan:  #1 fall in the context of multiple comorbidities  #2 generalized weakness  #3 adult failure to thrive  #4 myeloma  #6 questionable acute left superior pubic ramus and left inferior pubic ramus fractures  #7 chronic sleep disorder  Plan: Home health assessment with nursing, physical therapy,occupational therapy will be pursued.Her daughter states Dr Alain Marion has ordered these and she is awaiting contact from ? [facility]. Sleep hygiene emphasized. Sleeping pills or sedatives  are extremely high risk for recurrent fractures and musculoskeletal or cerebral injury.

## 2015-05-10 NOTE — Telephone Encounter (Signed)
Left message for daughter to call back to give [place] name of nurse i need to contact at [facility] ----orders have been approved for PT, but I need to know who to call

## 2015-05-11 ENCOUNTER — Encounter: Payer: Self-pay | Admitting: Internal Medicine

## 2015-05-11 ENCOUNTER — Telehealth: Payer: Self-pay

## 2015-05-11 ENCOUNTER — Ambulatory Visit: Payer: Self-pay | Admitting: Podiatry

## 2015-05-11 ENCOUNTER — Ambulatory Visit: Payer: Medicare Other | Admitting: Internal Medicine

## 2015-05-11 NOTE — Telephone Encounter (Signed)
Patient stated we need to call [facility]'s coordinator, she does not have person's name, and give order for PT---i called and left message for coordinator at [phone] to call me back

## 2015-05-11 NOTE — Telephone Encounter (Signed)
Please complete HH referral.

## 2015-05-11 NOTE — Telephone Encounter (Signed)
Dr Alain Marion has already placed referral for [facility]---dr plotnikov advised to disregard this note just sent by stacey---also i have checked with mary in referrals and she is faxing over info necessary to [facility] now---i have talked with Erasmo Downer at [facility] and she has been advised that we are sending---patient should be seen by home health starting Monday, but will try to work patient in sooner over weekend possibly if they have cancellation, [facility] is to notify daughter, Ebony Hail

## 2015-05-14 ENCOUNTER — Encounter: Payer: Self-pay | Admitting: Internal Medicine

## 2015-05-14 ENCOUNTER — Emergency Department (HOSPITAL_COMMUNITY): Payer: Medicare Other

## 2015-05-14 ENCOUNTER — Ambulatory Visit (INDEPENDENT_AMBULATORY_CARE_PROVIDER_SITE_OTHER): Payer: Medicare Other | Admitting: Internal Medicine

## 2015-05-14 ENCOUNTER — Emergency Department (HOSPITAL_COMMUNITY)
Admission: EM | Admit: 2015-05-14 | Discharge: 2015-05-14 | Disposition: A | Discharge status: Home / Self Care | Payer: Medicare Other | Source: Home / Self Care | Attending: Emergency Medicine | Admitting: Emergency Medicine

## 2015-05-14 ENCOUNTER — Encounter (HOSPITAL_COMMUNITY): Payer: Self-pay

## 2015-05-14 VITALS — BP 140/70 | HR 63 | Temp 97.6°F

## 2015-05-14 DIAGNOSIS — E860 Dehydration: Secondary | ICD-10-CM | POA: Diagnosis not present

## 2015-05-14 DIAGNOSIS — F062 Psychotic disorder with delusions due to known physiological condition: Secondary | ICD-10-CM | POA: Diagnosis not present

## 2015-05-14 DIAGNOSIS — F05 Delirium due to known physiological condition: Secondary | ICD-10-CM

## 2015-05-14 DIAGNOSIS — N390 Urinary tract infection, site not specified: Secondary | ICD-10-CM

## 2015-05-14 DIAGNOSIS — F22 Delusional disorders: Secondary | ICD-10-CM | POA: Diagnosis not present

## 2015-05-14 DIAGNOSIS — E162 Hypoglycemia, unspecified: Secondary | ICD-10-CM | POA: Diagnosis not present

## 2015-05-14 LAB — CBC WITH DIFFERENTIAL/PLATELET
Basophils Absolute: 0 10*3/uL (ref 0.0–0.1)
Basophils Relative: 0 % (ref 0–1)
EOS ABS: 0 10*3/uL (ref 0.0–0.7)
EOS PCT: 0 % (ref 0–5)
HCT: 32.9 % — ABNORMAL LOW (ref 36.0–46.0)
Hemoglobin: 10 g/dL — ABNORMAL LOW (ref 12.0–15.0)
LYMPHS ABS: 0.6 10*3/uL — AB (ref 0.7–4.0)
LYMPHS PCT: 21 % (ref 12–46)
MCH: 29.3 pg (ref 26.0–34.0)
MCHC: 30.4 g/dL (ref 30.0–36.0)
MCV: 96.5 fL (ref 78.0–100.0)
MONO ABS: 0.4 10*3/uL (ref 0.1–1.0)
MONOS PCT: 15 % — AB (ref 3–12)
Neutro Abs: 1.8 10*3/uL (ref 1.7–7.7)
Neutrophils Relative %: 64 % (ref 43–77)
PLATELETS: 143 10*3/uL — AB (ref 150–400)
RBC: 3.41 MIL/uL — ABNORMAL LOW (ref 3.87–5.11)
RDW: 16.4 % — AB (ref 11.5–15.5)
WBC: 2.9 10*3/uL — ABNORMAL LOW (ref 4.0–10.5)

## 2015-05-14 LAB — URINALYSIS, ROUTINE W REFLEX MICROSCOPIC
Glucose, UA: NEGATIVE mg/dL
Ketones, ur: 15 mg/dL — AB
Nitrite: NEGATIVE
PH: 5.5 (ref 5.0–8.0)
Protein, ur: 100 mg/dL — AB
Specific Gravity, Urine: 1.024 (ref 1.005–1.030)
Urobilinogen, UA: 0.2 mg/dL (ref 0.0–1.0)

## 2015-05-14 LAB — URINE MICROSCOPIC-ADD ON

## 2015-05-14 LAB — BASIC METABOLIC PANEL
Anion gap: 8 (ref 5–15)
BUN: 31 mg/dL — AB (ref 6–20)
CO2: 26 mmol/L (ref 22–32)
CREATININE: 0.64 mg/dL (ref 0.44–1.00)
Calcium: 10.9 mg/dL — ABNORMAL HIGH (ref 8.9–10.3)
Chloride: 106 mmol/L (ref 101–111)
GFR calc Af Amer: >60 mL/min (ref >60)
Glucose, Bld: 66 mg/dL (ref 65–99)
Potassium: 3.5 mmol/L (ref 3.5–5.1)
SODIUM: 140 mmol/L (ref 135–145)

## 2015-05-14 MED ORDER — FOSFOMYCIN TROMETHAMINE 3 G PO PACK: 3.0000 g | PACK | Freq: Once | ORAL | Status: DC

## 2015-05-14 MED ORDER — FOSFOMYCIN TROMETHAMINE 3 G PO PACK
3.0000 g | PACK | Freq: Once | ORAL | Status: AC
Start: 2015-05-14 — End: 2015-05-14
  Administered 2015-05-14: 3 g via ORAL
  Filled 2015-05-14: qty 3

## 2015-05-14 MED ORDER — DEXTROSE 5 % IV SOLN
1.0000 g | Freq: Once | INTRAVENOUS | Status: AC
  Administered 2015-05-14: 1 g via INTRAVENOUS
  Filled 2015-05-14: qty 10

## 2015-05-14 NOTE — Assessment & Plan Note (Signed)
8/16 worse  Pt sent to ER Pt may need an admission - FTT, falling, confused

## 2015-05-14 NOTE — Progress Notes (Signed)
Subjective:  Patient ID: Victoria [name], female    DOB: 1932-06-07  Age: 81 y.o. MRN: [id number]  CC: No chief complaint on file.   HPI Victoria [name] presents for a fall f/u. C/o FTT, confusion, hallucinations. Very paranoid. "A man came in and beat me to pulp".  Pt is not taking her meds, refusing to eat or drink; falling. Not sleeping. Spoke w/pt's dtr and son  Outpatient Prescriptions Prior to Visit  Medication Sig Dispense Refill  . cholecalciferol (VITAMIN D) 1000 UNITS tablet Take 2,000 Units by mouth daily.    Marland Kitchen dexamethasone (DECADRON) 4 MG tablet Take 5 tablets (20 mg total) by mouth daily. (Patient not taking: Reported on 05/14/2015) 60 tablet 0  . ergocalciferol (VITAMIN D2) 50000 UNITS capsule Take 1 capsule (50,000 Units total) by mouth once a week. (Patient not taking: Reported on 05/14/2015) 6 capsule 0  . ixazomib citrate (NINLARO) 4 MG capsule Take 1 capsule (4 mg total) by mouth once a week. Take on an empty stomach 1hr before or 2hrs after food. Do not crush, chew or open. Take on days 1,8,15 and skip day 22 (Patient not taking: Reported on 05/14/2015) 3 capsule 6  . lenalidomide (REVLIMID) 2.5 MG capsule Take 1 capsule daily for 21 days, then off 7 days (Patient not taking: Reported on 05/14/2015) 21 capsule 0  . LORazepam (ATIVAN) 1 MG tablet Take 1 tablet (1 mg total) by mouth every 8 (eight) hours as needed for anxiety. (Patient not taking: Reported on 05/14/2015) 60 tablet 3  . Multiple Vitamins-Minerals (MULTIVITAMIN & MINERAL PO) Take 1 tablet by mouth daily.    Marland Kitchen OVER THE COUNTER MEDICATION Take 2 capsules by mouth daily. Potassium supplement    . promethazine (PHENERGAN) 25 MG tablet Take 0.5 tablets (12.5 mg total) by mouth every 6 (six) hours as needed for nausea. (Patient not taking: Reported on 05/14/2015) 30 tablet 3  . tobramycin (TOBREX) 0.3 % ophthalmic solution Place 1 drop into the left eye every 4 (four) hours. (Patient not taking: Reported on 05/14/2015) 5 mL  0  . traMADol (ULTRAM) 50 MG tablet Take 1 tablet (50 mg total) by mouth every 6 (six) hours as needed. (Patient not taking: Reported on 05/14/2015) 60 tablet 0   Facility-Administered Medications Prior to Visit  Medication Dose Route Frequency Provider Last Rate Last Dose  . 0.9 %  sodium chloride infusion   Intravenous Once Heath Lark, MD       Past Medical History  Diagnosis Date  . Hypertension   . Insomnia   . Anxiety   . Anemia   . Multiple myeloma     In remission  . Diverticulosis 04/2001  . Perforation bowel   . Ringing in ears     Wears hearing aides to drown out   . Rectal bleeding 02/03/2014  . Anemia in chronic illness 01/24/2015   Past Surgical History  Procedure Laterality Date  . Sigmoid resection / rectopexy  01/2011    perforation/stoma  . Colonoscopy    . Partial hysterectomy    . Cesarean section    . Colostomy takedown  06/27/11  . Cholecystectomy  2012    laparoscopic    reports that she has never smoked. She has never used smokeless tobacco. She reports that she does not drink alcohol or use illicit drugs. family history includes Hypertension in her other; Prostate cancer in her father. There is no history of Colitis, Esophageal cancer, or Stomach cancer. Allergies  Allergen Reactions  . Amiodarone     rash  . Amoxicillin Hives and Itching  . Aspirin   . Atenolol     Hair loss  . Nortriptyline     agitation  . Latex Dermatitis  . Zoloft [Sertraline Hcl] Anxiety    Increased agitation    ROS Review of Systems  Constitutional: Positive for fatigue and unexpected weight change. Negative for chills, activity change and appetite change.  HENT: Negative for congestion, mouth sores and sinus pressure.   Eyes: Negative for visual disturbance.  Respiratory: Negative for cough and chest tightness.   Gastrointestinal: Negative for nausea, abdominal pain and abdominal distention.  Genitourinary: Negative for frequency, difficulty urinating and vaginal  pain.  Musculoskeletal: Positive for back pain and arthralgias. Negative for gait problem and neck stiffness.  Skin: Negative for pallor and rash.  Neurological: Positive for light-headedness and headaches. Negative for dizziness, tremors, speech difficulty, weakness and numbness.  Hematological: Does not bruise/bleed easily.  Psychiatric/Behavioral: Positive for hallucinations, behavioral problems, confusion, dysphoric mood and decreased concentration. Negative for sleep disturbance and agitation. The patient is nervous/anxious.     Objective:  BP 140/70 mmHg  Pulse 63  Temp(Src) 97.6 F (36.4 C) (Oral)  Wt   SpO2 97%  BP Readings from Last 3 Encounters:  05/14/15 140/70  05/10/15 148/72  05/08/15 150/73    Wt Readings from Last 3 Encounters:  04/30/15 104 lb (47.174 kg)  04/26/15 100 lb 3.2 oz (45.45 kg)  04/25/15 100 lb (45.36 kg)    Physical Exam  Constitutional: She appears well-developed. No distress.  HENT:  Head: Normocephalic.  Right Ear: External ear normal.  Left Ear: External ear normal.  Mouth/Throat: No oropharyngeal exudate.  Eyes: Conjunctivae are normal. Pupils are equal, round, and reactive to light. Right eye exhibits no discharge. Left eye exhibits no discharge.  Neck: Normal range of motion. Neck supple. No JVD present. No tracheal deviation present. No thyromegaly present.  Cardiovascular: Normal rate, regular rhythm and normal heart sounds.   Pulmonary/Chest: No stridor. No respiratory distress. She has no wheezes.  Abdominal: Soft. Bowel sounds are normal. She exhibits no distension and no mass. There is no tenderness. There is no rebound and no guarding.  Musculoskeletal: She exhibits tenderness. She exhibits no edema.  Lymphadenopathy:    She has no cervical adenopathy.  Neurological: She displays normal reflexes. No cranial nerve deficit. She exhibits normal muscle tone. Coordination normal.  Skin: No rash noted. No erythema.  In a  w/c Confused Mouth is dry Post scalp is tender   Lab Results  Component Value Date   WBC 3.8* 04/20/2015   HGB 9.2* 04/20/2015   HCT 29.3* 04/20/2015   PLT 260 04/20/2015   GLUCOSE 86 04/20/2015   CHOL 158 03/09/2007   TRIG 37 03/09/2007   HDL 58.8 03/09/2007   LDLCALC 92 03/09/2007   ALT 19 04/20/2015   AST 21 04/20/2015   NA 139 04/20/2015   K 4.4 04/20/2015   CL 109 02/05/2015   CREATININE 0.8 04/20/2015   BUN 16.2 04/20/2015   CO2 27 04/20/2015   TSH 0.762 12/30/2014   INR 1.19 02/21/2011   HGBA1C 5.3 03/09/2007    Ct Head Wo Contrast  05/08/2015   CLINICAL DATA:  Pain following fall.  Known multiple myeloma.  EXAM: CT HEAD WITHOUT CONTRAST  TECHNIQUE: Contiguous axial images were obtained from the base of the skull through the vertex without intravenous contrast.  COMPARISON:  Head CT and brain MRI   December 30, 2014  FINDINGS: Mild diffuse atrophy is stable. There is no intracranial mass, hemorrhage, extra-axial fluid collection, or midline shift. Small vessel disease in the centra semiovale bilaterally appear stable. No new gray-white compartment lesions. No acute infarct apparent.  Multiple lytic lesions consistent with multiple myeloma are noted throughout the calvarium, stable. A lytic lesion is noted in the right clivus region, stable. Mastoid air cells bilaterally are clear.  IMPRESSION: Multiple lytic bone lesions consistent with multiple myeloma, stable. Atrophy with supratentorial small vessel disease is stable. No intracranial mass, hemorrhage, or extra-axial fluid collection. No demonstrable acute infarct.   Electronically Signed   By: Lowella Grip III M.D.   On: 05/08/2015 22:57   Dg Shoulder Left  05/08/2015   CLINICAL DATA:  Left shoulder pain, fall in bathroom  EXAM: LEFT SHOULDER - 2+ VIEW  COMPARISON:  None.  FINDINGS: Two views of the left shoulder submitted. No acute fracture or subluxation. Degenerative changes AC joint. Inferior spurring of acromion. Minimal  degenerative changes glenohumeral joint.  IMPRESSION: No acute fracture or subluxation. Degenerative changes as described above.   Electronically Signed   By: Lahoma Crocker M.D.   On: 05/08/2015 21:45   Dg Hip Unilat With Pelvis 2-3 Views Left  05/08/2015   CLINICAL DATA:  Status post fall in bathroom, with left hip pain. Initial encounter.  EXAM: DG HIP (WITH OR WITHOUT PELVIS) 2-3V LEFT  COMPARISON:  CT of the abdomen and pelvis performed 02/22/2011, and abdominal radiograph performed 02/23/2011  FINDINGS: There is mild cortical irregularity involving the left superior pubic ramus, suspicious for an acute left superior pubic ramus fracture. A left inferior pubic ramus fracture may also be present.  Both femoral heads are seated normally within their respective acetabula. The proximal left femur appears intact. No significant degenerative change is appreciated. The sacroiliac joints are unremarkable in appearance. A large lytic lesion at the right iliac wing reflects the patient's known multiple myeloma, more prominent than in 2012. Tiny lucencies within the visualized osseous structures likely also reflect the patient's multiple myeloma.  The visualized bowel gas pattern is grossly unremarkable in appearance. Scattered phleboliths are noted within the pelvis.  IMPRESSION: 1. Mild cortical irregularity involving the left superior pubic ramus, suspicious for an acute left superior pubic ramus fracture. Left inferior pubic ramus fracture is also suspected. 2. Increasing size of large lytic lesion at the right iliac wing, reflecting the patient's known multiple myeloma. Scattered tiny lucencies within the visualized osseous structures likely also reflect multiple myeloma.   Electronically Signed   By: Garald Balding M.D.   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  No orders of the defined types were placed in this encounter.     Follow-up: No Follow-up on file.  Walker Kehr, MD

## 2015-05-14 NOTE — ED Notes (Signed)
Pt finished the medication about an hour ago per family.

## 2015-05-14 NOTE — Assessment & Plan Note (Signed)
8/16 worse  Pt sent to ER Pt may need an admission - FTT, falling, confused Needs labs, Foley ?[facility] in the future

## 2015-05-14 NOTE — Progress Notes (Signed)
Pre visit review using our clinic review tool, if applicable. No additional management support is needed unless otherwise documented below in the visit note. 

## 2015-05-14 NOTE — Progress Notes (Signed)
CM consulted by ED SW after assessing pt. Daughter showed interest in home health.  Cm reviewed in details medicare guidelines, home health [facility]) (length of stay in home, types of [facility] staff available, coverage, primary caregiver, up to 24 hrs before services may be started) and Private duty nursing (PDN-coverage, length of stay in the home types of staff available).  CM provided pt/family with a list of [place] home health agencies and PDN.  CM answered questions for Daughter who showed interest in [place] for [facility] and Home health services. Daughter states pcp may have initiated services  Pt during assessment telling CM "Don't speak to her." referring to daughter when CM would make eye contact with daughter.  Son at bedside with sunglasses on and initially informing Cm not to speak with pt Son reports pt is angry at "[place] right now"  When pt would make negative responses or tell family "secrets" the son and daughter would gasp and stated they "wish those IV fluids will go ahead and work" Both aware pt has a UTI and reports she has had a UTI and acted way before but reports "It is worst this time"    Discussed pt to be further evaluated by unit therapists (PT/OT) for recommendation of level of care and share this with attending MD and unit CM

## 2015-05-14 NOTE — ED Notes (Signed)
I  Attempted to collect labs and was unsuccessful.  I made nurse aware.

## 2015-05-14 NOTE — ED Provider Notes (Signed)
CSN: [id number]     Arrival date & time 05/14/15  1232 History   First MD Initiated Contact with Patient 05/14/15 1452     Chief Complaint  Patient presents with  . Fall  . Hallucinations     (Consider location/radiation/quality/duration/timing/severity/associated sxs/prior Treatment) Patient is a 81 y.o. female presenting with fall and altered mental status.  Fall Pertinent negatives include no chest pain and no shortness of breath.  Altered Mental Status Presenting symptoms: behavior changes, combativeness, confusion and disorientation   Presenting symptoms: no memory loss   Severity:  Moderate Most recent episode:  Today Duration:  5 days Timing:  Constant Chronicity:  New Context: dementia and not taking medications as prescribed   Context: not drug use, not head injury, not a nursing home resident and not a recent illness   Associated symptoms: agitation and hallucinations   Associated symptoms: no fever, no nausea and no vomiting     Past Medical History  Diagnosis Date  . Hypertension   . Insomnia   . Anxiety   . Anemia   . Multiple myeloma     In remission  . Diverticulosis 04/2001  . Perforation bowel   . Ringing in ears     Wears hearing aides to drown out   . Rectal bleeding 02/03/2014  . Anemia in chronic illness 01/24/2015   Past Surgical History  Procedure Laterality Date  . Sigmoid resection / rectopexy  01/2011    perforation/stoma  . Colonoscopy    . Partial hysterectomy    . Cesarean section    . Colostomy takedown  06/27/11  . Cholecystectomy  2012    laparoscopic   Family History  Problem Relation Age of Onset  . Hypertension Other   . Prostate cancer Father   . Colitis Neg Hx   . Esophageal cancer Neg Hx   . Stomach cancer Neg Hx    Social History  Substance Use Topics  . Smoking status: Never Smoker   . Smokeless tobacco: Never Used  . Alcohol Use: No   OB History    No data available     Review of Systems  Constitutional:  Negative for fever and chills.  HENT: Negative for congestion.   Eyes: Negative for pain.  Respiratory: Negative for cough and shortness of breath.   Cardiovascular: Positive for leg swelling (chronic). Negative for chest pain.  Gastrointestinal: Negative for nausea and vomiting.  Musculoskeletal: Negative for back pain.  Skin: Negative for pallor and wound.  Neurological: Negative for dizziness and syncope.  Psychiatric/Behavioral: Positive for suicidal ideas, hallucinations, behavioral problems, confusion and agitation. Negative for memory loss.      Allergies  Amiodarone; Amoxicillin; Aspirin; Atenolol; Donepezil; Nortriptyline; Latex; and Zoloft  Home Medications   Prior to Admission medications   Medication Sig Start Date End Date Taking? Authorizing Provider  cholecalciferol (VITAMIN D) 1000 UNITS tablet Take 2,000 Units by mouth every other day.    Yes Historical Provider, MD  dexamethasone (DECADRON) 4 MG tablet Take 5 tablets (20 mg total) by mouth daily. Patient taking differently: Take 20 mg by mouth once a week.  03/02/15  Yes Heath Lark, MD  ixazomib citrate (NINLARO) 4 MG capsule Take 1 capsule (4 mg total) by mouth once a week. Take on an empty stomach 1hr before or 2hrs after food. Do not crush, chew or open. Take on days 1,8,15 and skip day 22 03/02/15  Yes Ni Gorsuch, MD  LORazepam (ATIVAN) 1 MG tablet Take 1 tablet (  1 mg total) by mouth every 8 (eight) hours as needed for anxiety. 12/31/14  Yes Venetia Maxon Rama, MD  Multiple Vitamins-Minerals (MULTIVITAMIN & MINERAL PO) Take 1 tablet by mouth daily.   Yes Historical Provider, MD  OVER THE COUNTER MEDICATION Take 2 capsules by mouth daily. Potassium supplement   Yes Historical Provider, MD  traMADol (ULTRAM) 50 MG tablet Take 1 tablet (50 mg total) by mouth every 6 (six) hours as needed. Patient taking differently: Take 50 mg by mouth every 6 (six) hours as needed for moderate pain.  02/16/15  Yes Heath Lark, MD   ergocalciferol (VITAMIN D2) 50000 UNITS capsule Take 1 capsule (50,000 Units total) by mouth once a week. Patient not taking: Reported on 05/14/2015 01/05/15   Aleksei Plotnikov V, MD  fosfomycin (MONUROL) 3 G PACK Take 3 g by mouth once. 05/17/15   Merrily Pew, MD  lenalidomide (REVLIMID) 2.5 MG capsule Take 1 capsule daily for 21 days, then off 7 days Patient not taking: Reported on 05/14/2015 05/01/15   Heath Lark, MD  promethazine (PHENERGAN) 25 MG tablet Take 0.5 tablets (12.5 mg total) by mouth every 6 (six) hours as needed for nausea. Patient not taking: Reported on 05/14/2015 02/05/15   Heath Lark, MD  tobramycin (TOBREX) 0.3 % ophthalmic solution Place 1 drop into the left eye every 4 (four) hours. Patient not taking: Reported on 05/14/2015 03/30/15   Heath Lark, MD   BP 141/68 mmHg  Pulse 57  Temp(Src) 97.4 F (36.3 C) (Oral)  Resp 16  Ht $R'5\' 1"'uB$  (1.549 m)  Wt 100 lb (45.36 kg)  BMI 18.90 kg/m2  SpO2 97% Physical Exam  Constitutional: She appears well-developed and well-nourished.  HENT:  Head: Normocephalic and atraumatic.  Eyes: Pupils are equal, round, and reactive to light.  Neck: Normal range of motion.  Cardiovascular: Normal rate and regular rhythm.   Pulmonary/Chest: Effort normal.  Abdominal: Soft. She exhibits no distension. There is no tenderness.  Musculoskeletal: Normal range of motion. She exhibits tenderness (with ROM of R hip). She exhibits no edema.  Neurological: She is alert.  Skin: Skin is warm and dry.  Psychiatric: Her affect is labile. She is agitated.  Nursing note and vitals reviewed.   ED Course  Procedures (including critical care time) Labs Review Labs Reviewed  CBC WITH DIFFERENTIAL/PLATELET - Abnormal; Notable for the following:    WBC 2.9 (*)    RBC 3.41 (*)    Hemoglobin 10.0 (*)    HCT 32.9 (*)    RDW 16.4 (*)    Platelets 143 (*)    Lymphs Abs 0.6 (*)    Monocytes Relative 15 (*)    All other components within normal limits  BASIC  METABOLIC PANEL - Abnormal; Notable for the following:    BUN 31 (*)    Calcium 10.9 (*)    All other components within normal limits  URINALYSIS, ROUTINE W REFLEX MICROSCOPIC (NOT AT [facility]) - Abnormal; Notable for the following:    Color, Urine AMBER (*)    APPearance CLOUDY (*)    Hgb urine dipstick MODERATE (*)    Bilirubin Urine SMALL (*)    Ketones, ur 15 (*)    Protein, ur 100 (*)    Leukocytes, UA MODERATE (*)    All other components within normal limits  URINE MICROSCOPIC-ADD ON - Abnormal; Notable for the following:    Bacteria, UA MANY (*)    Casts HYALINE CASTS (*)    All other components  within normal limits  URINE CULTURE    Imaging Review Ct Head Wo Contrast  05/14/2015   CLINICAL DATA:  Fall last Tuesday.  Intracranial hemorrhage.  EXAM: CT HEAD WITHOUT CONTRAST  TECHNIQUE: Contiguous axial images were obtained from the base of the skull through the vertex without intravenous contrast.  COMPARISON:  05/08/2015.  FINDINGS: Multiple lytic lesions are present throughout the calvarium compatible with history of multiple myeloma. No mass lesion, mass effect, midline shift, hydrocephalus, hemorrhage. No acute territorial cortical ischemia/infarct. Atrophy and chronic ischemic white matter disease is present. Soft tissue swelling is present inferior to the occiput which is a chronic finding in this patient, some of which is probably due to partial volume averaging. Intracranial atherosclerosis. The visible paranasal sinuses are within normal limits.  IMPRESSION: No acute intracranial abnormality. Atrophy and chronic ischemic white matter disease.   Electronically Signed   By: Dereck Ligas M.D.   On: 05/14/2015 16:25   I, Cahlil Sattar, Corene Cornea, personally reviewed and evaluated these images and lab results as part of my medical decision-making.   EKG Interpretation None      MDM   Final diagnoses:  UTI (lower urinary tract infection)   [place] a few days ago, unknown if she hit her  head seen here. Had non-op pelvis fracture and sent home. At home has been confused, angry, combative, not taking meds, sleeping more, not eating, not drinking. On exam, alert but delusional and labile (often yelling at son). Labs with UTI, as she is altered, not taking meds at home I worry she won't take antibiotics at home so we'll try to give her a dose of fosfomycin here which is a one time only antibiotic for UTIs. If she is able to take that and tolerate by mouth we'll discharge her home. We'll give another dose for take in 3 days and then follow-up with her primary doctor for test of cure in a week.  Patient able tolerate by mouth in the emergency department. Vital signs still stable. Her daughter would like to take her home and try to treat this as an outpatient.  I have personally and contemperaneously reviewed labs and imaging and used in my decision making as above.   A medical screening exam was performed and I feel the patient has had an appropriate workup for their chief complaint at this time and likelihood of emergent condition existing is low. They have been counseled on decision, discharge, follow up and which symptoms necessitate immediate return to the emergency department. They or their family verbally stated understanding and agreement with plan and discharged in stable condition.      Merrily Pew, MD 05/14/15 [phone]

## 2015-05-14 NOTE — Progress Notes (Signed)
CSW attempted to speak with patient at bedside. However, daughter was present.   Daughter confirms that the patient presents due to fall. Daughter states that she does not know what caused the patient to fall. She states that the patient does not having any falls besides this incident,  Daughter informed CSW that the patient lives with her in [place]. She states that the patient needs some assistance with completing ADL's and may possibly need home health in the future. Daughter states that she is not interested in a facility. She states that the patient going to a facility would be a last resort.  Daughter/[place] [phone]  Willette Brace [phone] ED CSW 05/14/2015 6:38 PM

## 2015-05-14 NOTE — ED Notes (Signed)
Pt is oriented to self, place and situation.  However, pt states son is her brother.

## 2015-05-14 NOTE — Assessment & Plan Note (Signed)
8/16 poor po intake - worse Needs IVF. Pt sent to ER

## 2015-05-14 NOTE — ED Notes (Signed)
Per daughter, pt here from [place] office.  Pt has had a fall last Tuesday.  Small fracture to left pelvis.  Pt has some short term memory loss.  However, since Saturday, pt has not been eating/drinking/taking meds.  Lives with daughter.  Pt also having hallucinations - asking about deceased mother.  No urinary symptoms complaint

## 2015-05-15 ENCOUNTER — Inpatient Hospital Stay (HOSPITAL_COMMUNITY)
Admission: EM | Admit: 2015-05-15 | Discharge: 2015-05-21 | DRG: 885 | Disposition: A | Discharge status: Skilled Nursing Facility | Payer: Medicare Other | Attending: Internal Medicine | Admitting: Internal Medicine

## 2015-05-15 ENCOUNTER — Emergency Department (HOSPITAL_COMMUNITY): Payer: Medicare Other

## 2015-05-15 ENCOUNTER — Telehealth: Payer: Self-pay | Admitting: *Deleted

## 2015-05-15 ENCOUNTER — Encounter (HOSPITAL_COMMUNITY): Payer: Self-pay | Admitting: Emergency Medicine

## 2015-05-15 ENCOUNTER — Telehealth: Payer: Self-pay | Admitting: Internal Medicine

## 2015-05-15 DIAGNOSIS — T451X5A Adverse effect of antineoplastic and immunosuppressive drugs, initial encounter: Secondary | ICD-10-CM | POA: Diagnosis present

## 2015-05-15 DIAGNOSIS — Z880 Allergy status to penicillin: Secondary | ICD-10-CM

## 2015-05-15 DIAGNOSIS — D696 Thrombocytopenia, unspecified: Secondary | ICD-10-CM | POA: Diagnosis present

## 2015-05-15 DIAGNOSIS — E43 Unspecified severe protein-calorie malnutrition: Secondary | ICD-10-CM | POA: Insufficient documentation

## 2015-05-15 DIAGNOSIS — Z7952 Long term (current) use of systemic steroids: Secondary | ICD-10-CM

## 2015-05-15 DIAGNOSIS — Z79891 Long term (current) use of opiate analgesic: Secondary | ICD-10-CM

## 2015-05-15 DIAGNOSIS — C9002 Multiple myeloma in relapse: Secondary | ICD-10-CM | POA: Diagnosis present

## 2015-05-15 DIAGNOSIS — G9341 Metabolic encephalopathy: Secondary | ICD-10-CM | POA: Diagnosis present

## 2015-05-15 DIAGNOSIS — R627 Adult failure to thrive: Secondary | ICD-10-CM | POA: Diagnosis present

## 2015-05-15 DIAGNOSIS — D701 Agranulocytosis secondary to cancer chemotherapy: Secondary | ICD-10-CM | POA: Diagnosis present

## 2015-05-15 DIAGNOSIS — S329XXD Fracture of unspecified parts of lumbosacral spine and pelvis, subsequent encounter for fracture with routine healing: Secondary | ICD-10-CM

## 2015-05-15 DIAGNOSIS — F0391 Unspecified dementia with behavioral disturbance: Secondary | ICD-10-CM | POA: Diagnosis present

## 2015-05-15 DIAGNOSIS — X58XXXA Exposure to other specified factors, initial encounter: Secondary | ICD-10-CM | POA: Diagnosis present

## 2015-05-15 DIAGNOSIS — F22 Delusional disorders: Principal | ICD-10-CM | POA: Diagnosis present

## 2015-05-15 DIAGNOSIS — Z79899 Other long term (current) drug therapy: Secondary | ICD-10-CM

## 2015-05-15 DIAGNOSIS — F062 Psychotic disorder with delusions due to known physiological condition: Secondary | ICD-10-CM | POA: Diagnosis present

## 2015-05-15 DIAGNOSIS — I10 Essential (primary) hypertension: Secondary | ICD-10-CM | POA: Diagnosis present

## 2015-05-15 DIAGNOSIS — R404 Transient alteration of awareness: Secondary | ICD-10-CM

## 2015-05-15 DIAGNOSIS — W19XXXA Unspecified fall, initial encounter: Secondary | ICD-10-CM | POA: Diagnosis present

## 2015-05-15 DIAGNOSIS — E162 Hypoglycemia, unspecified: Secondary | ICD-10-CM | POA: Diagnosis present

## 2015-05-15 DIAGNOSIS — Z886 Allergy status to analgesic agent status: Secondary | ICD-10-CM

## 2015-05-15 DIAGNOSIS — Z91040 Latex allergy status: Secondary | ICD-10-CM

## 2015-05-15 DIAGNOSIS — D638 Anemia in other chronic diseases classified elsewhere: Secondary | ICD-10-CM | POA: Diagnosis present

## 2015-05-15 DIAGNOSIS — G4700 Insomnia, unspecified: Secondary | ICD-10-CM | POA: Diagnosis present

## 2015-05-15 DIAGNOSIS — D6481 Anemia due to antineoplastic chemotherapy: Secondary | ICD-10-CM | POA: Diagnosis present

## 2015-05-15 DIAGNOSIS — F411 Generalized anxiety disorder: Secondary | ICD-10-CM | POA: Diagnosis present

## 2015-05-15 DIAGNOSIS — N390 Urinary tract infection, site not specified: Secondary | ICD-10-CM | POA: Diagnosis present

## 2015-05-15 DIAGNOSIS — F03918 Unspecified dementia, unspecified severity, with other behavioral disturbance: Secondary | ICD-10-CM | POA: Diagnosis present

## 2015-05-15 DIAGNOSIS — H109 Unspecified conjunctivitis: Secondary | ICD-10-CM | POA: Diagnosis present

## 2015-05-15 DIAGNOSIS — Z888 Allergy status to other drugs, medicaments and biological substances status: Secondary | ICD-10-CM

## 2015-05-15 DIAGNOSIS — E46 Unspecified protein-calorie malnutrition: Secondary | ICD-10-CM | POA: Diagnosis present

## 2015-05-15 DIAGNOSIS — Z8249 Family history of ischemic heart disease and other diseases of the circulatory system: Secondary | ICD-10-CM

## 2015-05-15 LAB — CBG MONITORING, ED
Glucose-Capillary: 27 mg/dL — CL (ref 65–99)
Glucose-Capillary: 126 mg/dL — ABNORMAL HIGH (ref 65–99)

## 2015-05-15 LAB — BASIC METABOLIC PANEL
Anion gap: 13 (ref 5–15)
BUN: 29 mg/dL — AB (ref 6–20)
CHLORIDE: 109 mmol/L (ref 101–111)
CO2: 21 mmol/L — ABNORMAL LOW (ref 22–32)
CREATININE: 0.85 mg/dL (ref 0.44–1.00)
Calcium: 10.9 mg/dL — ABNORMAL HIGH (ref 8.9–10.3)
GFR calc Af Amer: >60 mL/min (ref >60)
GFR calc non Af Amer: >60 mL/min (ref >60)
Glucose, Bld: 35 mg/dL — CL (ref 65–99)
Potassium: 3.6 mmol/L (ref 3.5–5.1)
SODIUM: 143 mmol/L (ref 135–145)

## 2015-05-15 LAB — CBC WITH DIFFERENTIAL/PLATELET
Basophils Absolute: 0 10*3/uL (ref 0.0–0.1)
Basophils Relative: 0 % (ref 0–1)
EOS ABS: 0 10*3/uL (ref 0.0–0.7)
EOS PCT: 0 % (ref 0–5)
HCT: 33.3 % — ABNORMAL LOW (ref 36.0–46.0)
HEMOGLOBIN: 10.1 g/dL — AB (ref 12.0–15.0)
LYMPHS ABS: 0.8 10*3/uL (ref 0.7–4.0)
Lymphocytes Relative: 24 % (ref 12–46)
MCH: 29.5 pg (ref 26.0–34.0)
MCHC: 30.3 g/dL (ref 30.0–36.0)
MCV: 97.4 fL (ref 78.0–100.0)
MONOS PCT: 12 % (ref 3–12)
Monocytes Absolute: 0.4 10*3/uL (ref 0.1–1.0)
Neutro Abs: 2.2 10*3/uL (ref 1.7–7.7)
Neutrophils Relative %: 64 % (ref 43–77)
PLATELETS: 154 10*3/uL (ref 150–400)
RBC: 3.42 MIL/uL — ABNORMAL LOW (ref 3.87–5.11)
RDW: 16.6 % — ABNORMAL HIGH (ref 11.5–15.5)
WBC: 3.4 10*3/uL — ABNORMAL LOW (ref 4.0–10.5)

## 2015-05-15 MED ORDER — DEXTROSE 50 % IV SOLN
1.0000 | Freq: Once | INTRAVENOUS | Status: AC
  Administered 2015-05-15: 50 mL via INTRAVENOUS
  Filled 2015-05-15: qty 50

## 2015-05-15 NOTE — Telephone Encounter (Signed)
Ebony Hail is also advising that dr plotnikov is going to have [facility] be the [facility] provider. There is no notation at this point regarding this.

## 2015-05-15 NOTE — ED Notes (Signed)
Bed: WA08 Expected date:  Expected time:  Means of arrival:  Comments: EMS/75F/not taking meds/pt is A&Ox 4

## 2015-05-15 NOTE — Telephone Encounter (Signed)
Patient is needing a referral to an orthopedic surgeon. She was seen in the ED, but they could not do a referral. They are asking for dr. Fredonia Highland @ [facility].

## 2015-05-15 NOTE — ED Notes (Signed)
Pt presents from home via EMS for generalized weakness. Seen yesterday for UTI and fall. Family reports patient has not taken home meds since Saturday. EMS CBG 46. EMS reports pt took a few sips of oral glucose.   Last VS 54hr, 130/76,

## 2015-05-15 NOTE — ED Notes (Signed)
Pt refusing to drink or eat. Pt tried a sip of cranberry juice and orange juice and spit both out. EDP notified.

## 2015-05-15 NOTE — Progress Notes (Signed)
CM notified Victoria [name] of pt/daughter interest in [facility] PDN services while at [facility] ED on last pm

## 2015-05-15 NOTE — ED Provider Notes (Signed)
CSN: [id number]     Arrival date & time 05/15/15  2127 History   First MD Initiated Contact with Patient 05/15/15 2137     Chief Complaint  Patient presents with  . Weakness     (Consider location/radiation/quality/duration/timing/severity/associated sxs/prior Treatment) HPI 81 year old female who presents with increased agitation and altered mental status. She has history of dementia, lives with daughter at home. She had presented to the emergency department yesterday for increasing confusion and agitation after recent fall. She was diagnosed with a urinary tract infection and treated with antibiotics. Ct head was performed on that visit for AMS, and was negative for acute processes. She was discharged home and she was tolerating pills by mouth without difficulty. Her daughter reports that she has not eaten anything over the course of the day today, and has been in bed all day. Towards the end of the evening she has became more altered, agitated, and combative. EMS was subsequently called and she was brought to the ED for further evaluation. On arrival she was noted to have point-of-care glucose of 27.  Her daughter reports that she has not had any fever, repeat falls, difficulty breathing, cough, abdominal pain, nausea, vomiting, diarrhea, or complaints of chest pain.   Past Medical History  Diagnosis Date  . Hypertension   . Insomnia   . Anxiety   . Anemia   . Multiple myeloma     In remission  . Diverticulosis 04/2001  . Perforation bowel   . Ringing in ears     Wears hearing aides to drown out   . Rectal bleeding 02/03/2014  . Anemia in chronic illness 01/24/2015   Past Surgical History  Procedure Laterality Date  . Sigmoid resection / rectopexy  01/2011    perforation/stoma  . Colonoscopy    . Partial hysterectomy    . Cesarean section    . Colostomy takedown  06/27/11  . Cholecystectomy  2012    laparoscopic   Family History  Problem Relation Age of Onset  . Hypertension  Other   . Prostate cancer Father   . Colitis Neg Hx   . Esophageal cancer Neg Hx   . Stomach cancer Neg Hx    Social History  Substance Use Topics  . Smoking status: Never Smoker   . Smokeless tobacco: Never Used  . Alcohol Use: No   OB History    No data available     Review of Systems  Unable to perform ROS: Dementia      Allergies  Amiodarone; Amoxicillin; Aspirin; Atenolol; Donepezil; Nortriptyline; Latex; and Zoloft  Home Medications   Prior to Admission medications   Medication Sig Start Date End Date Taking? Authorizing Provider  cholecalciferol (VITAMIN D) 1000 UNITS tablet Take 2,000 Units by mouth every other day.    Yes Historical Provider, MD  cholecalciferol (VITAMIN D) 1000 UNITS tablet Take 2,000 Units by mouth daily.   Yes Historical Provider, MD  dexamethasone (DECADRON) 4 MG tablet Take 5 tablets (20 mg total) by mouth daily. Patient taking differently: Take 20 mg by mouth once a week.  03/02/15  Yes Heath Lark, MD  ixazomib citrate (NINLARO) 4 MG capsule Take 1 capsule (4 mg total) by mouth once a week. Take on an empty stomach 1hr before or 2hrs after food. Do not crush, chew or open. Take on days 1,8,15 and skip day 22 03/02/15  Yes Heath Lark, MD  Multiple Vitamins-Minerals (MULTIVITAMIN & MINERAL PO) Take 1 tablet by mouth daily.  Yes Historical Provider, MD  OVER THE COUNTER MEDICATION Take 2 capsules by mouth daily. Potassium supplement   Yes Historical Provider, MD  promethazine (PHENERGAN) 25 MG tablet Take 0.5 tablets (12.5 mg total) by mouth every 6 (six) hours as needed for nausea. 02/05/15  Yes Heath Lark, MD  ergocalciferol (VITAMIN D2) 50000 UNITS capsule Take 1 capsule (50,000 Units total) by mouth once a week. Patient not taking: Reported on 05/14/2015 01/05/15   Aleksei Plotnikov V, MD  fosfomycin (MONUROL) 3 G PACK Take 3 g by mouth once. 05/17/15   Merrily Pew, MD  lenalidomide (REVLIMID) 2.5 MG capsule Take 1 capsule daily for 21 days, then off  7 days Patient not taking: Reported on 05/14/2015 05/01/15   Heath Lark, MD  LORazepam (ATIVAN) 1 MG tablet Take 1 tablet (1 mg total) by mouth every 8 (eight) hours as needed for anxiety. 12/31/14   Christina P Rama, MD  tobramycin (TOBREX) 0.3 % ophthalmic solution Place 1 drop into the left eye every 4 (four) hours. Patient not taking: Reported on 05/14/2015 03/30/15   Heath Lark, MD  traMADol (ULTRAM) 50 MG tablet Take 1 tablet (50 mg total) by mouth every 6 (six) hours as needed. Patient not taking: Reported on 05/15/2015 02/16/15   Heath Lark, MD   BP 144/56 mmHg  Pulse 55  Temp(Src) 97.4 F (36.3 C) (Oral)  Resp 20  SpO2 99% Physical Exam Physical Exam  Nursing note and vitals reviewed. Constitutional: Elderly woman, thin, non-toxic, and in no acute distress Head: Normocephalic and atraumatic.  Mouth/Throat: Oropharynx is clear and dry.  Neck: Normal range of motion. Neck supple.  Cardiovascular: Normal rate and regular rhythm.  Trace edema. Pulmonary/Chest: Effort normal and breath sounds normal.  Abdominal: Soft. There is no tenderness. There is no rebound and no guarding.  Musculoskeletal: Normal range of motion.  Neurological: Alert, no facial droop, fluent speech, moves all extremities symmetrically Skin: Skin is warm and dry.  Psychiatric: Cooperative  ED Course  Procedures (including critical care time) Labs Review Labs Reviewed  CBC WITH DIFFERENTIAL/PLATELET - Abnormal; Notable for the following:    WBC 3.4 (*)    RBC 3.42 (*)    Hemoglobin 10.1 (*)    HCT 33.3 (*)    RDW 16.6 (*)    All other components within normal limits  BASIC METABOLIC PANEL - Abnormal; Notable for the following:    CO2 21 (*)    Glucose, Bld 35 (*)    BUN 29 (*)    Calcium 10.9 (*)    All other components within normal limits  CBG MONITORING, ED - Abnormal; Notable for the following:    Glucose-Capillary 27 (*)    All other components within normal limits  CBG MONITORING, ED - Abnormal;  Notable for the following:    Glucose-Capillary 126 (*)    All other components within normal limits    Imaging Review Dg Chest 2 View  05/15/2015   CLINICAL DATA:  Acute onset of altered mental status and weakness. Initial encounter.  EXAM: CHEST  2 VIEW  COMPARISON:  Chest radiograph performed 06/20/2011  FINDINGS: The lungs are hypoexpanded. Vascular crowding and vascular congestion are seen, with minimal bilateral atelectasis. There is no evidence of pleural effusion or pneumothorax.  The heart is normal in size; the mediastinal contour is within normal limits. No acute osseous abnormalities are seen.  IMPRESSION: Lungs hypoexpanded. Vascular congestion noted, with minimal bilateral atelectasis.   Electronically Signed   By: Jacqulynn Cadet  Chang M.D.   On: 05/15/2015 22:35   Ct Head Wo Contrast  05/14/2015   CLINICAL DATA:  Fall last Tuesday.  Intracranial hemorrhage.  EXAM: CT HEAD WITHOUT CONTRAST  TECHNIQUE: Contiguous axial images were obtained from the base of the skull through the vertex without intravenous contrast.  COMPARISON:  05/08/2015.  FINDINGS: Multiple lytic lesions are present throughout the calvarium compatible with history of multiple myeloma. No mass lesion, mass effect, midline shift, hydrocephalus, hemorrhage. No acute territorial cortical ischemia/infarct. Atrophy and chronic ischemic white matter disease is present. Soft tissue swelling is present inferior to the occiput which is a chronic finding in this patient, some of which is probably due to partial volume averaging. Intracranial atherosclerosis. The visible paranasal sinuses are within normal limits.  IMPRESSION: No acute intracranial abnormality. Atrophy and chronic ischemic white matter disease.   Electronically Signed   By: Dereck Ligas M.D.   On: 05/14/2015 16:25   I , Brantley Stage, have personally reviewed and evaluated these images and lab results as part of my medical decision-making.   MDM   Final diagnoses:   Hypoglycemia  Transient alteration of awareness    In short, this is a 81 year old female with history of dementia new recently diagnosed UTI who presents with agitation and confusion. On arrival, she has point-of-care glucose 27, and received an amp of D50. After this, her agitation and confusion improved, and her daughter reports that she is at her baseline mental status. Vital signs are otherwise unremarkable and exam nonfocal. She continues to maintain normoglycemia after D50, but refuses to take any oral intake in the emergency department despite multiple attempts. Blood work otherwise unremarkable. Given that patient continues to refuse by mouth intake, she will not likely not maintain her blood sugars at home. I've discussed this with the hospitalist, and admitted onto their service for further management.   Forde Dandy, MD 05/15/15 [phone]

## 2015-05-15 NOTE — Telephone Encounter (Signed)
Daughter called to say patient was not eating and not sleeping. Wants to know if she needs to be admitted. Dr Alvy Bimler states if daughter feels she needs to be admitted, she needs to go to the ED. Dr Alvy Bimler can see her today at 12:30 if desired, plus IVF. Daughter states they went to ED yesterday and patient has just now gone to sleep. Is going to let her sleep. Will take to ED of needed or will call us if she wants her to see Dr Alvy Bimler and get IVF.

## 2015-05-15 NOTE — ED Notes (Signed)
Glucose 35 mg/dl Called by lab at 2246 RR aware.

## 2015-05-16 DIAGNOSIS — E46 Unspecified protein-calorie malnutrition: Secondary | ICD-10-CM

## 2015-05-16 DIAGNOSIS — I10 Essential (primary) hypertension: Secondary | ICD-10-CM | POA: Diagnosis present

## 2015-05-16 DIAGNOSIS — H109 Unspecified conjunctivitis: Secondary | ICD-10-CM | POA: Diagnosis present

## 2015-05-16 DIAGNOSIS — W19XXXA Unspecified fall, initial encounter: Secondary | ICD-10-CM | POA: Diagnosis present

## 2015-05-16 DIAGNOSIS — Z7952 Long term (current) use of systemic steroids: Secondary | ICD-10-CM | POA: Diagnosis not present

## 2015-05-16 DIAGNOSIS — C9002 Multiple myeloma in relapse: Secondary | ICD-10-CM | POA: Diagnosis present

## 2015-05-16 DIAGNOSIS — G4700 Insomnia, unspecified: Secondary | ICD-10-CM | POA: Diagnosis present

## 2015-05-16 DIAGNOSIS — E162 Hypoglycemia, unspecified: Secondary | ICD-10-CM | POA: Diagnosis present

## 2015-05-16 DIAGNOSIS — Z79899 Other long term (current) drug therapy: Secondary | ICD-10-CM | POA: Diagnosis not present

## 2015-05-16 DIAGNOSIS — F0391 Unspecified dementia with behavioral disturbance: Secondary | ICD-10-CM

## 2015-05-16 DIAGNOSIS — D6959 Other secondary thrombocytopenia: Secondary | ICD-10-CM

## 2015-05-16 DIAGNOSIS — Z79891 Long term (current) use of opiate analgesic: Secondary | ICD-10-CM | POA: Diagnosis not present

## 2015-05-16 DIAGNOSIS — R627 Adult failure to thrive: Secondary | ICD-10-CM | POA: Diagnosis present

## 2015-05-16 DIAGNOSIS — N390 Urinary tract infection, site not specified: Secondary | ICD-10-CM | POA: Diagnosis present

## 2015-05-16 DIAGNOSIS — G9341 Metabolic encephalopathy: Secondary | ICD-10-CM | POA: Diagnosis present

## 2015-05-16 DIAGNOSIS — T451X5A Adverse effect of antineoplastic and immunosuppressive drugs, initial encounter: Secondary | ICD-10-CM | POA: Diagnosis present

## 2015-05-16 DIAGNOSIS — Z888 Allergy status to other drugs, medicaments and biological substances status: Secondary | ICD-10-CM | POA: Diagnosis not present

## 2015-05-16 DIAGNOSIS — F29 Unspecified psychosis not due to a substance or known physiological condition: Secondary | ICD-10-CM

## 2015-05-16 DIAGNOSIS — X58XXXA Exposure to other specified factors, initial encounter: Secondary | ICD-10-CM | POA: Diagnosis present

## 2015-05-16 DIAGNOSIS — F062 Psychotic disorder with delusions due to known physiological condition: Secondary | ICD-10-CM | POA: Diagnosis not present

## 2015-05-16 DIAGNOSIS — Z8249 Family history of ischemic heart disease and other diseases of the circulatory system: Secondary | ICD-10-CM | POA: Diagnosis not present

## 2015-05-16 DIAGNOSIS — Z880 Allergy status to penicillin: Secondary | ICD-10-CM | POA: Diagnosis not present

## 2015-05-16 DIAGNOSIS — D6481 Anemia due to antineoplastic chemotherapy: Secondary | ICD-10-CM | POA: Diagnosis present

## 2015-05-16 DIAGNOSIS — D696 Thrombocytopenia, unspecified: Secondary | ICD-10-CM | POA: Diagnosis present

## 2015-05-16 DIAGNOSIS — D638 Anemia in other chronic diseases classified elsewhere: Secondary | ICD-10-CM

## 2015-05-16 DIAGNOSIS — F22 Delusional disorders: Secondary | ICD-10-CM | POA: Diagnosis present

## 2015-05-16 DIAGNOSIS — Z91040 Latex allergy status: Secondary | ICD-10-CM | POA: Diagnosis not present

## 2015-05-16 DIAGNOSIS — G9340 Encephalopathy, unspecified: Secondary | ICD-10-CM

## 2015-05-16 DIAGNOSIS — F411 Generalized anxiety disorder: Secondary | ICD-10-CM | POA: Diagnosis present

## 2015-05-16 DIAGNOSIS — D701 Agranulocytosis secondary to cancer chemotherapy: Secondary | ICD-10-CM | POA: Diagnosis present

## 2015-05-16 DIAGNOSIS — E43 Unspecified severe protein-calorie malnutrition: Secondary | ICD-10-CM | POA: Diagnosis present

## 2015-05-16 DIAGNOSIS — Z886 Allergy status to analgesic agent status: Secondary | ICD-10-CM | POA: Diagnosis not present

## 2015-05-16 LAB — COMPREHENSIVE METABOLIC PANEL
ALK PHOS: 47 U/L (ref 38–126)
ALT: 13 U/L — AB (ref 14–54)
AST: 22 U/L (ref 15–41)
Albumin: 2.6 g/dL — ABNORMAL LOW (ref 3.5–5.0)
Anion gap: 9 (ref 5–15)
BUN: 28 mg/dL — AB (ref 6–20)
CHLORIDE: 108 mmol/L (ref 101–111)
CO2: 24 mmol/L (ref 22–32)
CREATININE: 0.77 mg/dL (ref 0.44–1.00)
Calcium: 10.4 mg/dL — ABNORMAL HIGH (ref 8.9–10.3)
GFR calc Af Amer: >60 mL/min (ref >60)
Glucose, Bld: 104 mg/dL — ABNORMAL HIGH (ref 65–99)
Potassium: 3.2 mmol/L — ABNORMAL LOW (ref 3.5–5.1)
SODIUM: 141 mmol/L (ref 135–145)
Total Bilirubin: 0.8 mg/dL (ref 0.3–1.2)
Total Protein: 8.1 g/dL (ref 6.5–8.1)

## 2015-05-16 LAB — CBC
HCT: 30.1 % — ABNORMAL LOW (ref 36.0–46.0)
Hemoglobin: 9.1 g/dL — ABNORMAL LOW (ref 12.0–15.0)
MCH: 28.9 pg (ref 26.0–34.0)
MCHC: 30.2 g/dL (ref 30.0–36.0)
MCV: 95.6 fL (ref 78.0–100.0)
PLATELETS: 141 10*3/uL — AB (ref 150–400)
RBC: 3.15 MIL/uL — ABNORMAL LOW (ref 3.87–5.11)
RDW: 16.4 % — AB (ref 11.5–15.5)
WBC: 2.8 10*3/uL — ABNORMAL LOW (ref 4.0–10.5)

## 2015-05-16 LAB — URINE CULTURE

## 2015-05-16 LAB — CBG MONITORING, ED
GLUCOSE-CAPILLARY: 74 mg/dL (ref 65–99)
Glucose-Capillary: 53 mg/dL — ABNORMAL LOW (ref 65–99)
Glucose-Capillary: 62 mg/dL — ABNORMAL LOW (ref 65–99)
Glucose-Capillary: 98 mg/dL (ref 65–99)
Glucose-Capillary: 97 mg/dL (ref 65–99)

## 2015-05-16 LAB — GLUCOSE, CAPILLARY
Glucose-Capillary: 77 mg/dL (ref 65–99)
Glucose-Capillary: 84 mg/dL (ref 65–99)
Glucose-Capillary: 83 mg/dL (ref 65–99)
Glucose-Capillary: 113 mg/dL — ABNORMAL HIGH (ref 65–99)

## 2015-05-16 MED ORDER — ACETAMINOPHEN 650 MG RE SUPP: 650.0000 mg | Freq: Four times a day (QID) | RECTAL | Status: DC | PRN

## 2015-05-16 MED ORDER — TRAMADOL HCL 50 MG PO TABS
50.0000 mg | ORAL_TABLET | Freq: Four times a day (QID) | ORAL | Status: DC | PRN
  Administered 2015-05-16: 50 mg via ORAL
  Filled 2015-05-16: qty 1

## 2015-05-16 MED ORDER — TOBRAMYCIN 0.3 % OP OINT
TOPICAL_OINTMENT | Freq: Four times a day (QID) | OPHTHALMIC | Status: AC
  Administered 2015-05-16 – 2015-05-17 (×3): via OPHTHALMIC
  Filled 2015-05-16: qty 3.5

## 2015-05-16 MED ORDER — ONDANSETRON HCL 4 MG PO TABS
4.0000 mg | ORAL_TABLET | Freq: Four times a day (QID) | ORAL | Status: DC | PRN
Start: 2015-05-16 — End: 2015-05-21

## 2015-05-16 MED ORDER — ONDANSETRON HCL 4 MG/2ML IJ SOLN: 4.0000 mg | Freq: Four times a day (QID) | INTRAMUSCULAR | Status: DC | PRN

## 2015-05-16 MED ORDER — DEXTROSE 10 % IV SOLN
INTRAVENOUS | Status: DC
  Administered 2015-05-16: 100 mL/h via INTRAVENOUS

## 2015-05-16 MED ORDER — ACETAMINOPHEN 325 MG PO TABS
650.0000 mg | ORAL_TABLET | Freq: Four times a day (QID) | ORAL | Status: DC | PRN
  Filled 2015-05-16: qty 2

## 2015-05-16 MED ORDER — SODIUM CHLORIDE 0.9 % IJ SOLN
3.0000 mL | Freq: Two times a day (BID) | INTRAMUSCULAR | Status: DC
  Administered 2015-05-16 – 2015-05-21 (×9): 3 mL via INTRAVENOUS

## 2015-05-16 MED ORDER — ENOXAPARIN SODIUM 40 MG/0.4ML ~~LOC~~ SOLN
40.0000 mg | SUBCUTANEOUS | Status: DC
  Administered 2015-05-16 – 2015-05-21 (×4): 40 mg via SUBCUTANEOUS
  Filled 2015-05-16 (×5): qty 0.4

## 2015-05-16 MED ORDER — LORAZEPAM 1 MG PO TABS
1.0000 mg | ORAL_TABLET | Freq: Three times a day (TID) | ORAL | Status: DC | PRN
  Administered 2015-05-16 – 2015-05-21 (×8): 1 mg via ORAL
  Filled 2015-05-16 (×11): qty 1

## 2015-05-16 MED ORDER — CIPROFLOXACIN IN D5W 400 MG/200ML IV SOLN
400.0000 mg | Freq: Two times a day (BID) | INTRAVENOUS | Status: DC
  Administered 2015-05-16 – 2015-05-17 (×4): 400 mg via INTRAVENOUS
  Filled 2015-05-16 (×5): qty 200

## 2015-05-16 MED ORDER — DEXTROSE 10 % IV SOLN
INTRAVENOUS | Status: DC
  Administered 2015-05-16: 01:00:00 via INTRAVENOUS
  Filled 2015-05-16: qty 1000

## 2015-05-16 MED ORDER — POTASSIUM CHLORIDE IN NACL 40-0.9 MEQ/L-% IV SOLN
INTRAVENOUS | Status: DC
  Administered 2015-05-16: 75 mL/h via INTRAVENOUS
  Filled 2015-05-16: qty 1000

## 2015-05-16 NOTE — Progress Notes (Signed)
Pt admitted after midnight, please see earlier admission note by Dr. Posey Pronto. Pt with known multiple myeloma in relapse, admitted for confusion and agitation. Noted to have mild hyperglycemia and possible UTI. Started on IVF and IV Cipro. Will continue same regimen. Repeat CBC and BMP in AM. Appreciate oncology team following.   Faye Ramsay, MD  [facility] Pager [phone]  If 7PM-7AM, please contact night-coverage [email or web address] Password [email or web address]

## 2015-05-16 NOTE — Telephone Encounter (Signed)
What should I do about [facility]?  OK ortho ref  Thx
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
## 2015-05-16 NOTE — Progress Notes (Signed)
Pt in for confusion, weakness, confusion with agitation and hypoglycemia via the ED from home. Pt daughter Ebony Hail is present in room. Pt awake and alert, initially on arrival pt was irritable, did not want to answer questions and complained about everything including room being cold, staff being ruff and bed being hard. Once patient daughter left, pt apologized for being rude and was much more pleasant. When asked how does she and her daughter get along at home, pt responded " she doesn't like me and I don't like her". When asked why not, pt states she does not feed me. Pt walks at home with a walker but has had recent falls. Pt has no major complaints at this time. PT and OT consult orders in. Daughter requesting home health for patient. Has used [facility] previously.

## 2015-05-16 NOTE — Care Management Note (Signed)
Case Management Note  Patient Details  Name: Victoria [name] MRN: [id number] Date of Birth: September 18, 1932  Subjective/Objective: 81 y/o f admitted w/hypoglycemia. From home.PT-HH.                   Action/Plan:d/c plan home.   Expected Discharge Date:   (unknown)               Expected Discharge Plan:  [place]  In-House Referral:     Discharge planning Services  CM Consult  Post Acute Care Choice:    Choice offered to:     DME Arranged:    DME Agency:     HH Arranged:    HH Agency:     Status of Service:  In process, will continue to follow  Medicare Important Message Given:    Date Medicare IM Given:    Medicare IM give by:    Date Additional Medicare IM Given:    Additional Medicare Important Message give by:     If discussed at [place] of Stay Meetings, dates discussed:    Additional Comments:  Dessa Phi, RN 05/16/2015, 3:43 PM

## 2015-05-16 NOTE — Consult Note (Signed)
[place]  [phone]   Patient Care Team: Cassandria Anger, MD as PCP - General [place] Dragon, MD as Consulting Physician (Gastroenterology) Leonie Man, MD as Consulting Physician (Cardiology) Heath Lark, MD as Consulting Physician (Hematology and Oncology)  HOSPITAL CONSULT  NOTE I have seen the patient, examined her and edited the notes as follows  HPI: Ms Victoria [name] is a 81 year old woman with a history of multiple myeloma as detailed below, admitted on 05/16/15 due to confusion. The patien's history is obtained by her daughter. Per her report, patient had presented initially on 8/16 after sustaining a fall due to increased debilitation and confusion, diagnosed with UTI and discharged home with antibiotics. CT of the head at the time was negative.  She returned to the Emergency Room 24 hrs later with increased agitation and confusion, poor oral intake. No fevers or chills were reported. No respiratory or cardiac issues were noted.  Daughter denies patient had  nausea, heartburn, abdominal pain or change in bowel habits. Last bowel movement on 8/16. Denied abnormal skin rashes, or neuropathy. Denied any bleeding issues such as epistaxis, hematemesis, hematuria or hematochezia. She decreased ambulation significantly over the last few weeks, worsened by fall on 8/9. She was noted to have hypoglycemia with glucose levels at 27 requiring D50 then to D10 fluids with improvement. Urinalysis was suspicious of UTI for which IV antibiotics were given. Cultures pending.  We were kindly informed of the patient's admission     Multiple myeloma in relapse   12/30/2014 Initial Diagnosis Multiple myeloma in relapse   02/05/2015 Bone Marrow Biopsy BM biopsy was non-diagnostic. FISH was positive for loss of 17p and gain of chromosome 11   02/06/2015 -  Chemotherapy She was started on Ninlaro, dexamethasone and Revlimid.   02/16/2015 Adverse Reaction Revlimid was placed on hold  due to uncontrolled diarrhea.     MEDICATIONS: Scheduled Meds: . ciprofloxacin  400 mg Intravenous Q12H  . enoxaparin (LOVENOX) injection  40 mg Subcutaneous Q24H  . sodium chloride  3 mL Intravenous Q12H   Continuous Infusions:  PRN Meds:.acetaminophen **OR** acetaminophen, LORazepam, ondansetron **OR** ondansetron (ZOFRAN) IV, traMADol   ALLERGIES:   Allergies  Allergen Reactions  . Amiodarone     rash  . Amoxicillin Hives and Itching  . Aspirin Other (See Comments)    Child hood allergy   . Atenolol     Hair loss  . Donepezil Other (See Comments)    agitation   . Nortriptyline     agitation  . Latex Dermatitis  . Zoloft [Sertraline Hcl] Anxiety    Increased agitation   Review of systems: Per daughter's report due to patient's confusion. She was tearful that she has to stay in the hospital. Constitutional: Denies fevers, chills or abnormal night sweats Eyes: Denies blurriness of vision, double vision or watery eyes Ears, nose, mouth, throat, and face: Denies mucositis or sore throat Respiratory:  Denies cough, dyspnea or wheezes Cardiovascular: Denies palpitation, chest discomfort or lower extremity swelling Gastrointestinal:  Denies nausea, heartburn or change in bowel habits Skin: Denies abnormal skin rashes Lymphatics: Denies new lymphadenopathy or easy bruising Neurological: Denies numbness, tingling or new weaknesses Behavioral/Psych: She was agitated and combative on presentation, now controlled, no new changes  All other systems were reviewed with the patient and are negative.  PHYSICAL EXAMINATION:  Filed Vitals:   05/16/15 0909  BP: 134/67  Pulse: 54  Temp: 97.7 F (36.5 C)  Resp: 20  There were no vitals filed for this visit.  GENERAL:Lethargic, ill appearing, no distress. Appears more comfortable since admission SKIN: skin color, texture, turgor are dry, no rashes or significant lesions EYES: Noted left eye conjunctivitis. OROPHARYNX:no  exudate, no erythema and lips, buccal mucosa, and tongue normal  NECK: supple, thyroid normal size, non-tender, without nodularity LYMPH:  no palpable lymphadenopathy in the cervical, axillary or inguinal LUNGS: clear to auscultation and percussion with normal breathing effort HEART: regular rate & rhythm and no murmurs and no lower extremity edema ABDOMEN: soft, tender at the lower quadrants and normal bowel sounds Musculoskeletal:no cyanosis of digits and no clubbing. Tender at the left pretibial area, and left hip, daughter reports this is the area of fall. No cords palpable PSYCH: lethargic, non conversant at this time NEURO:  Confused at this time, no apparent focal motor/sensory deficits   LABORATORY/RADIOLOGY DATA:   Recent Labs Lab 05/14/15 1416 05/15/15 2157 05/16/15 0501  WBC 2.9* 3.4* 2.8*  HGB 10.0* 10.1* 9.1*  HCT 32.9* 33.3* 30.1*  PLT 143* 154 141*  MCV 96.5 97.4 95.6  MCH 29.3 29.5 28.9  MCHC 30.4 30.3 30.2  RDW 16.4* 16.6* 16.4*  LYMPHSABS 0.6* 0.8  --   MONOABS 0.4 0.4  --   EOSABS 0.0 0.0  --   BASOSABS 0.0 0.0  --     CMP    Recent Labs Lab 05/14/15 1416 05/15/15 2157 05/16/15 0501  NA 140 143 141  K 3.5 3.6 3.2*  CL 106 109 108  CO2 26 21* 24  GLUCOSE 66 35* 104*  BUN 31* 29* 28*  CREATININE 0.64 0.85 0.77  CALCIUM 10.9* 10.9* 10.4*  AST  --   --  22  ALT  --   --  13*  ALKPHOS  --   --  47  BILITOT  --   --  0.8        Component Value Date/Time   BILITOT 0.8 05/16/2015 0501   BILITOT 0.57 04/20/2015 1536   BILIDIR 0.2 10/02/2014 1654   IBILI 0.8 02/22/2011 0208       Component Value Date/Time   ESRSEDRATE 145* 01/25/2015 1606    No results for input(s): INR, PROTIME in the last 168 hours.    Urinalysis    Component Value Date/Time   COLORURINE AMBER* 05/14/2015 1326   APPEARANCEUR CLOUDY* 05/14/2015 1326   LABSPEC 1.024 05/14/2015 1326   PHURINE 5.5 05/14/2015 1326   GLUCOSEU NEGATIVE 05/14/2015 1326   GLUCOSEU  NEGATIVE 10/02/2014 1654   HGBUR MODERATE* 05/14/2015 1326   BILIRUBINUR SMALL* 05/14/2015 1326   KETONESUR 15* 05/14/2015 1326   PROTEINUR 100* 05/14/2015 1326   UROBILINOGEN 0.2 05/14/2015 1326   NITRITE NEGATIVE 05/14/2015 1326   LEUKOCYTESUR MODERATE* 05/14/2015 1326    Drugs of Abuse     Component Value Date/Time   LABOPIA NONE DETECTED 01/21/2015 0103   COCAINSCRNUR NONE DETECTED 01/21/2015 0103   LABBENZ NONE DETECTED 01/21/2015 0103   AMPHETMU NONE DETECTED 01/21/2015 0103   THCU NONE DETECTED 01/21/2015 0103   LABBARB NONE DETECTED 01/21/2015 0103     Liver Function Tests:  Recent Labs Lab 05/16/15 0501  AST 22  ALT 13*  ALKPHOS 47  BILITOT 0.8  PROT 8.1  ALBUMIN 2.6*    CBG:  Recent Labs Lab 05/16/15 0110 05/16/15 0204 05/16/15 0324 05/16/15 0517 05/16/15 0839  GLUCAP 53* 62* 98 97 77   Radiology Studies:  Dg Chest 2 View  05/15/2015   CLINICAL DATA:  Acute onset of altered mental status and weakness. Initial encounter.  EXAM: CHEST  2 VIEW  COMPARISON:  Chest radiograph performed 06/20/2011  FINDINGS: The lungs are hypoexpanded. Vascular crowding and vascular congestion are seen, with minimal bilateral atelectasis. There is no evidence of pleural effusion or pneumothorax.  The heart is normal in size; the mediastinal contour is within normal limits. No acute osseous abnormalities are seen.  IMPRESSION: Lungs hypoexpanded. Vascular congestion noted, with minimal bilateral atelectasis.   Electronically Signed   By: Garald Balding M.D.   On: 05/15/2015 22:35   Ct Head Wo Contrast  05/14/2015   CLINICAL DATA:  Fall last Tuesday.  Intracranial hemorrhage.  EXAM: CT HEAD WITHOUT CONTRAST  TECHNIQUE: Contiguous axial images were obtained from the base of the skull through the vertex without intravenous contrast.  COMPARISON:  05/08/2015.  FINDINGS: Multiple lytic lesions are present throughout the calvarium compatible with history of multiple myeloma. No mass  lesion, mass effect, midline shift, hydrocephalus, hemorrhage. No acute territorial cortical ischemia/infarct. Atrophy and chronic ischemic white matter disease is present. Soft tissue swelling is present inferior to the occiput which is a chronic finding in this patient, some of which is probably due to partial volume averaging. Intracranial atherosclerosis. The visible paranasal sinuses are within normal limits.  IMPRESSION: No acute intracranial abnormality. Atrophy and chronic ischemic white matter disease.   Electronically Signed   By: Dereck Ligas M.D.   On: 05/14/2015 16:25   Ct Head Wo Contrast  05/08/2015   CLINICAL DATA:  Pain following fall.  Known multiple myeloma.  EXAM: CT HEAD WITHOUT CONTRAST  TECHNIQUE: Contiguous axial images were obtained from the base of the skull through the vertex without intravenous contrast.  COMPARISON:  Head CT and brain MRI December 30, 2014  FINDINGS: Mild diffuse atrophy is stable. There is no intracranial mass, hemorrhage, extra-axial fluid collection, or midline shift. Small vessel disease in the centra semiovale bilaterally appear stable. No new gray-white compartment lesions. No acute infarct apparent.  Multiple lytic lesions consistent with multiple myeloma are noted throughout the calvarium, stable. A lytic lesion is noted in the right clivus region, stable. Mastoid air cells bilaterally are clear.  IMPRESSION: Multiple lytic bone lesions consistent with multiple myeloma, stable. Atrophy with supratentorial small vessel disease is stable. No intracranial mass, hemorrhage, or extra-axial fluid collection. No demonstrable acute infarct.   Electronically Signed   By: Lowella Grip III M.D.   On: 05/08/2015 22:57   Dg Shoulder Left  05/08/2015   CLINICAL DATA:  Left shoulder pain, fall in bathroom  EXAM: LEFT SHOULDER - 2+ VIEW  COMPARISON:  None.  FINDINGS: Two views of the left shoulder submitted. No acute fracture or subluxation. Degenerative changes AC  joint. Inferior spurring of acromion. Minimal degenerative changes glenohumeral joint.  IMPRESSION: No acute fracture or subluxation. Degenerative changes as described above.   Electronically Signed   By: Lahoma Crocker M.D.   On: 05/08/2015 21:45   Dg Hip Unilat With Pelvis 2-3 Views Left  05/08/2015   CLINICAL DATA:  Status post fall in bathroom, with left hip pain. Initial encounter.  EXAM: DG HIP (WITH OR WITHOUT PELVIS) 2-3V LEFT  COMPARISON:  CT of the abdomen and pelvis performed 02/22/2011, and abdominal radiograph performed 02/23/2011  FINDINGS: There is mild cortical irregularity involving the left superior pubic ramus, suspicious for an acute left superior pubic ramus fracture. A left inferior pubic ramus fracture may also be present.  Both femoral heads are seated  normally within their respective acetabula. The proximal left femur appears intact. No significant degenerative change is appreciated. The sacroiliac joints are unremarkable in appearance. A large lytic lesion at the right iliac wing reflects the patient's known multiple myeloma, more prominent than in 2012. Tiny lucencies within the visualized osseous structures likely also reflect the patient's multiple myeloma.  The visualized bowel gas pattern is grossly unremarkable in appearance. Scattered phleboliths are noted within the pelvis.  IMPRESSION: 1. Mild cortical irregularity involving the left superior pubic ramus, suspicious for an acute left superior pubic ramus fracture. Left inferior pubic ramus fracture is also suspected. 2. Increasing size of large lytic lesion at the right iliac wing, reflecting the patient's known multiple myeloma. Scattered tiny lucencies within the visualized osseous structures likely also reflect multiple myeloma.   Electronically Signed   By: Garald Balding M.D.   On: 05/08/2015 21:52   ASSESSMENT AND PLAN:  Multiple myeloma in relapse Unfortunately, since we held Revlimid, her serum protein electrophoresis, M  spike and free light chains are worse. She was placed back on Revlimid on 8/1 at a lower dose 2.5 mg daily for 21 days, 7 days off along with Ninlaro and dexamethasone. Revlimid was held again since 8/15 Will see her back as outpatient for further assessment Recommend hold all chemotherapy until acute issues resolve  UTI, likely Cultures pending Continue IV Cipro, IV fluids as per primary team  Hypoglycemia She was admitted with confusion, agitation She was found to have severe hypoglycemia requiring D50, now on D10 IV fluids Plan as per primary team  Acute encephalopathy Psychotic disorder Mild hypercalcemia This is in the setting of possible UTI and hypoglycemia. The corrected blood calcium also show hypercalcemia CT head negative for acute intracranial findings Continue supportive care with IV antibiotics, IV fluids as above Revlimid is on hold. Psychiatric consult is pending Agree with IV fluid resuscitation. If calcium level does not improve she may need IV bisphosphonates/steroids Rx  Anemia due to chemotherapy This is combination of anemia of chronic disease related to treatment. Dose of chemotherapy has been lowered as above. No bleeding issues are reported No transfusion in indicated at this time Will monitor  Leukopenia due to antineoplastic chemotherapy This is likely due to recent treatment.  Nevers, cough, chills, diarrhea or dysuria had been reported.  She is asymptomatic from the leukopenia.  I will observe for now.   Thrombocytopenia, mild This is due to malignancy, recent chemotherapy, dilution, infection, antibiotics No bleeding issues are noted Monitor counts closely No transfusion is indicated at this time Transfuse 1 unit of platelets if count is less or equal than 10,000 or 20,000 if the patient is acutely bleeding  Hold Lovenox if  platelets drop to less than 50,000  Conjuntivitis Start topical eye drops  Malnutrition Consider Nutrition  evaluation  DVT prophylaxis On Lovenox  Full Code  Discharge planning If she does not improve within next 48 hours, consider palliative care  Other medical issues as per admitting team   [facility] E, PA-C 05/16/2015, 10:08 AM  Mykenna Viele, MD 05/16/2015

## 2015-05-16 NOTE — ED Notes (Signed)
Bed: WA17 Expected date:  Expected time:  Means of arrival:  Comments: Room 8 

## 2015-05-16 NOTE — ED Notes (Signed)
Pt ate a whole ham sandwich and a couple spoonfuls of applesauce.

## 2015-05-16 NOTE — H&P (Signed)
[facility] History and Physical  Patient: Victoria [name]  MRN: [id number]  DOB: August 26, 1932  DOS: the patient was seen and examined on 05/16/2015 PCP: Walker Kehr, MD  Referring physician: Dr Oleta Mouse Chief Complaint: Agitation  HPI: Victoria [name] is a 81 y.o. female with Past medical history of hypertension, anxiety, multiple myeloma, anemia. Patient presents with complaints of confusion. Patient was seen in the ER yesterday after a fall and was found to be having increasing confusion and agitation. She was diagnosed with UTI and was sent home with antibiotics. After going home she has not had anything to eat for whole day and has laid in the bed. Later on she become more agitated and was combative and therefore EMS was called. Patient was found to be hypoglycemic with sugars of 27. There was no fall trauma or injury reported. There was no recent change in her medications reported. She has not been eating since she feels that she is being poisoned by her daughter. She has not discussed this with anyone else. There is no incontinence of bowel or bladder reported.  The patient is coming from home.  At her baseline ambulates with walker And is independent for most of her ADL does not manages her medication on her own.  Review of Systems: as mentioned in the history of present illness.  A comprehensive review of the other systems is negative.  Past Medical History  Diagnosis Date  . Hypertension   . Insomnia   . Anxiety   . Anemia   . Multiple myeloma     In remission  . Diverticulosis 04/2001  . Perforation bowel   . Ringing in ears     Wears hearing aides to drown out   . Rectal bleeding 02/03/2014  . Anemia in chronic illness 01/24/2015   Past Surgical History  Procedure Laterality Date  . Sigmoid resection / rectopexy  01/2011    perforation/stoma  . Colonoscopy    . Partial hysterectomy    . Cesarean section    . Colostomy takedown  06/27/11  . Cholecystectomy   2012    laparoscopic   Social History:  reports that she has never smoked. She has never used smokeless tobacco. She reports that she does not drink alcohol or use illicit drugs.  Allergies  Allergen Reactions  . Amiodarone     rash  . Amoxicillin Hives and Itching  . Aspirin Other (See Comments)    Child hood allergy   . Atenolol     Hair loss  . Donepezil Other (See Comments)    agitation   . Nortriptyline     agitation  . Latex Dermatitis  . Zoloft [Sertraline Hcl] Anxiety    Increased agitation    Family History  Problem Relation Age of Onset  . Hypertension Other   . Prostate cancer Father   . Colitis Neg Hx   . Esophageal cancer Neg Hx   . Stomach cancer Neg Hx     Prior to Admission medications   Medication Sig Start Date End Date Taking? Authorizing Provider  cholecalciferol (VITAMIN D) 1000 UNITS tablet Take 2,000 Units by mouth every other day.    Yes Historical Provider, MD  cholecalciferol (VITAMIN D) 1000 UNITS tablet Take 2,000 Units by mouth daily.   Yes Historical Provider, MD  dexamethasone (DECADRON) 4 MG tablet Take 5 tablets (20 mg total) by mouth daily. Patient taking differently: Take 20 mg by mouth once a week.  03/02/15  Yes Ni  Alvy Bimler, MD  ixazomib citrate (NINLARO) 4 MG capsule Take 1 capsule (4 mg total) by mouth once a week. Take on an empty stomach 1hr before or 2hrs after food. Do not crush, chew or open. Take on days 1,8,15 and skip day 22 03/02/15  Yes Heath Lark, MD  Multiple Vitamins-Minerals (MULTIVITAMIN & MINERAL PO) Take 1 tablet by mouth daily.   Yes Historical Provider, MD  OVER THE COUNTER MEDICATION Take 2 capsules by mouth daily. Potassium supplement   Yes Historical Provider, MD  promethazine (PHENERGAN) 25 MG tablet Take 0.5 tablets (12.5 mg total) by mouth every 6 (six) hours as needed for nausea. 02/05/15  Yes Heath Lark, MD  ergocalciferol (VITAMIN D2) 50000 UNITS capsule Take 1 capsule (50,000 Units total) by mouth once a  week. Patient not taking: Reported on 05/14/2015 01/05/15   Aleksei Plotnikov V, MD  fosfomycin (MONUROL) 3 G PACK Take 3 g by mouth once. 05/17/15   Merrily Pew, MD  lenalidomide (REVLIMID) 2.5 MG capsule Take 1 capsule daily for 21 days, then off 7 days Patient not taking: Reported on 05/14/2015 05/01/15   Heath Lark, MD  LORazepam (ATIVAN) 1 MG tablet Take 1 tablet (1 mg total) by mouth every 8 (eight) hours as needed for anxiety. 12/31/14   Christina P Rama, MD  tobramycin (TOBREX) 0.3 % ophthalmic solution Place 1 drop into the left eye every 4 (four) hours. Patient not taking: Reported on 05/14/2015 03/30/15   Heath Lark, MD  traMADol (ULTRAM) 50 MG tablet Take 1 tablet (50 mg total) by mouth every 6 (six) hours as needed. Patient not taking: Reported on 05/15/2015 02/16/15   Heath Lark, MD    Physical Exam: Filed Vitals:   05/16/15 0002 05/16/15 0100 05/16/15 0111 05/16/15 0321  BP: 143/54 143/56 143/56 126/55  Pulse: 52 54 54 56  Temp:      TempSrc:      Resp: $Remo'13  13 14  'SmYNG$ SpO2: 97% 97% 97% 97%    General: Alert, Awake and Oriented to Place and Person. Appear in mild distress Eyes: PERRL ENT: Oral Mucosa clear dry. Neck: no JVD Cardiovascular: S1 and S2 Present, no Murmur, Peripheral Pulses Present Respiratory: Bilateral Air entry equal and Decreased,  Clear to Auscultation, no Crackles, no wheezes Abdomen: Bowel Sound present, Soft and non tenderness Skin: no Rash Extremities: no Pedal edema, no calf tenderness Neurologic: Grossly no focal neuro deficit.  Labs on Admission:  CBC:  Recent Labs Lab 05/14/15 1416 05/15/15 2157 05/16/15 0501  WBC 2.9* 3.4* 2.8*  NEUTROABS 1.8 2.2  --   HGB 10.0* 10.1* 9.1*  HCT 32.9* 33.3* 30.1*  MCV 96.5 97.4 95.6  PLT 143* 154 141*    CMP     Component Value Date/Time   NA 141 05/16/2015 0501   NA 139 04/20/2015 1536   K 3.2* 05/16/2015 0501   K 4.4 04/20/2015 1536   CL 108 05/16/2015 0501   CL 103 03/11/2013 1503   CO2 24  05/16/2015 0501   CO2 27 04/20/2015 1536   GLUCOSE 104* 05/16/2015 0501   GLUCOSE 86 04/20/2015 1536   GLUCOSE 91 03/11/2013 1503   BUN 28* 05/16/2015 0501   BUN 16.2 04/20/2015 1536   CREATININE 0.77 05/16/2015 0501   CREATININE 0.8 04/20/2015 1536   CALCIUM 10.4* 05/16/2015 0501   CALCIUM 9.5 04/20/2015 1536   CALCIUM 11.4* 12/30/2014 0835   PROT 8.1 05/16/2015 0501   PROT 8.8* 04/20/2015 1536   ALBUMIN 2.6* 05/16/2015  0501   ALBUMIN 2.8* 04/20/2015 1536   AST 22 05/16/2015 0501   AST 21 04/20/2015 1536   ALT 13* 05/16/2015 0501   ALT 19 04/20/2015 1536   ALKPHOS 47 05/16/2015 0501   ALKPHOS 68 04/20/2015 1536   BILITOT 0.8 05/16/2015 0501   BILITOT 0.57 04/20/2015 1536   GFRNONAA >60 05/16/2015 0501   GFRAA >60 05/16/2015 0501    No results for input(s): LIPASE, AMYLASE in the last 168 hours.  No results for input(s): CKTOTAL, CKMB, CKMBINDEX, TROPONINI in the last 168 hours. BNP (last 3 results) No results for input(s): BNP in the last 8760 hours.  ProBNP (last 3 results) No results for input(s): PROBNP in the last 8760 hours.   Radiological Exams on Admission: Dg Chest 2 View  05/15/2015   CLINICAL DATA:  Acute onset of altered mental status and weakness. Initial encounter.  EXAM: CHEST  2 VIEW  COMPARISON:  Chest radiograph performed 06/20/2011  FINDINGS: The lungs are hypoexpanded. Vascular crowding and vascular congestion are seen, with minimal bilateral atelectasis. There is no evidence of pleural effusion or pneumothorax.  The heart is normal in size; the mediastinal contour is within normal limits. No acute osseous abnormalities are seen.  IMPRESSION: Lungs hypoexpanded. Vascular congestion noted, with minimal bilateral atelectasis.   Electronically Signed   By: Garald Balding M.D.   On: 05/15/2015 22:35   Ct Head Wo Contrast  05/14/2015   CLINICAL DATA:  Fall last Tuesday.  Intracranial hemorrhage.  EXAM: CT HEAD WITHOUT CONTRAST  TECHNIQUE: Contiguous axial  images were obtained from the base of the skull through the vertex without intravenous contrast.  COMPARISON:  05/08/2015.  FINDINGS: Multiple lytic lesions are present throughout the calvarium compatible with history of multiple myeloma. No mass lesion, mass effect, midline shift, hydrocephalus, hemorrhage. No acute territorial cortical ischemia/infarct. Atrophy and chronic ischemic white matter disease is present. Soft tissue swelling is present inferior to the occiput which is a chronic finding in this patient, some of which is probably due to partial volume averaging. Intracranial atherosclerosis. The visible paranasal sinuses are within normal limits.  IMPRESSION: No acute intracranial abnormality. Atrophy and chronic ischemic white matter disease.   Electronically Signed   By: Dereck Ligas M.D.   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 Hypoglycemia The patient is presenting with confusion and agitation. She adamantly refused to eat initially to ER provider's request. She has reportedly had not had anything to eat during the day since her discharge from the ER. She was found to be hypoglycemic with glucose and 27. Repeat glucose after D50 was 128 but dropped down again to 70s. She has been started on D10. Currently she is agreeable to eat after my discussion with her. We will continue to monitor her CBG. She will be monitored in stepdown units since she is refusing to eat at present.  2. Psychotic disorder with delusions. History of dementia. She does carry a diagnosis of psychotic disorder with dilatation in the past. As per daughter she had similar episodes in the past. Currently patient adamantly states that her daughter is trying to poison  her as well as staff in the ER. I will consult psychiatry in the morning. Continue close monitoring.  3. Multiple myeloma. In  the setting of ongoing confusion currently holding her treatment.  4. Possible UTI. With her allergy I will treat her with ciprofloxacin. Follow cultures.  5. Anxiety. Continuing lorazepam.  Advance goals of care discussion: Full code as per discussion with patient's daughter.   DVT Prophylaxis: subcutaneous Heparin Nutrition: Regular diet advance as tolerated  Family Communication: family was present at bedside, opportunity was given to ask question and all questions were answered satisfactorily at the time of interview. Disposition: Admitted as inpatient, step-down unit.  Author: Berle Mull, MD [facility] Pager: [phone] 05/16/2015   Addendum: Patient finally had a full sandwich and her sugars remained above 90 without D10. Patient will be transferred to telemetry. Monitoring CBG every 4 hours. Patient remains off of D10 drip.  Victoria [name] 6:14 AM 05/16/2015   If 7PM-7AM, please contact night-coverage [email or web address] Password [email or web address]

## 2015-05-16 NOTE — ED Notes (Signed)
Daughter Ebony Hail would like to be notified if patient eats or moves upstairs. Mobile number is [phone].

## 2015-05-16 NOTE — Progress Notes (Signed)
UR done. 

## 2015-05-17 ENCOUNTER — Ambulatory Visit: Payer: Self-pay | Admitting: Podiatry

## 2015-05-17 ENCOUNTER — Telehealth: Payer: Self-pay | Admitting: *Deleted

## 2015-05-17 ENCOUNTER — Other Ambulatory Visit: Payer: Self-pay | Admitting: Hematology and Oncology

## 2015-05-17 DIAGNOSIS — R531 Weakness: Secondary | ICD-10-CM

## 2015-05-17 LAB — CBC
HEMATOCRIT: 28 % — AB (ref 36.0–46.0)
HEMOGLOBIN: 8.4 g/dL — AB (ref 12.0–15.0)
MCH: 28.8 pg (ref 26.0–34.0)
MCHC: 30 g/dL (ref 30.0–36.0)
MCV: 95.9 fL (ref 78.0–100.0)
PLATELETS: 132 10*3/uL — AB (ref 150–400)
RBC: 2.92 MIL/uL — AB (ref 3.87–5.11)
RDW: 16.5 % — ABNORMAL HIGH (ref 11.5–15.5)
WBC: 2.9 10*3/uL — AB (ref 4.0–10.5)

## 2015-05-17 LAB — BASIC METABOLIC PANEL
ANION GAP: 4 — AB (ref 5–15)
BUN: 22 mg/dL — ABNORMAL HIGH (ref 6–20)
CO2: 25 mmol/L (ref 22–32)
Calcium: 10 mg/dL (ref 8.9–10.3)
Chloride: 111 mmol/L (ref 101–111)
Creatinine, Ser: 0.72 mg/dL (ref 0.44–1.00)
GFR calc Af Amer: >60 mL/min (ref >60)
GLUCOSE: 76 mg/dL (ref 65–99)
POTASSIUM: 3.9 mmol/L (ref 3.5–5.1)
Sodium: 140 mmol/L (ref 135–145)

## 2015-05-17 LAB — GLUCOSE, CAPILLARY
GLUCOSE-CAPILLARY: 100 mg/dL — AB (ref 65–99)
GLUCOSE-CAPILLARY: 106 mg/dL — AB (ref 65–99)
GLUCOSE-CAPILLARY: 76 mg/dL (ref 65–99)
Glucose-Capillary: 102 mg/dL — ABNORMAL HIGH (ref 65–99)
Glucose-Capillary: 126 mg/dL — ABNORMAL HIGH (ref 65–99)

## 2015-05-17 NOTE — Care Management Note (Signed)
Case Management Note  Patient Details  Name: Victoria [name] MRN: [id number] Date of Birth: 04/04/1932  Subjective/Objective:   81 y/o f admitted w/hypoglycemia.Active w/[facility]-will confirm which services.PT-HHC/SNF                 Action/Plan:dc plan home w/HHC.   Expected Discharge Date:   (unknown)               Expected Discharge Plan:  [place]  In-House Referral:     Discharge planning Services  CM Consult  Post Acute Care Choice:  Home Health (Active w/[facility]) Choice offered to:     DME Arranged:    DME Agency:     HH Arranged:    HH Agency:     Status of Service:  In process, will continue to follow  Medicare Important Message Given:    Date Medicare IM Given:    Medicare IM give by:    Date Additional Medicare IM Given:    Additional Medicare Important Message give by:     If discussed at [place] of Stay Meetings, dates discussed:    Additional Comments:  Dessa Phi, RN 05/17/2015, 12:11 PM

## 2015-05-17 NOTE — Progress Notes (Addendum)
[place] with Medical director [phone] Message left for medical director

## 2015-05-17 NOTE — Telephone Encounter (Signed)
Received call from [place] at [facility].   States they have been unable to reach pt to deliver Revlimid.  Left multiple messages over past 2 weeks w/ no return call.   Informed her pt in hospital now and to place Revlimid Rx on hold.  We will send new Rx to [facility] when and if Revlimid is resumed.

## 2015-05-17 NOTE — Progress Notes (Signed)
Patient ID: Victoria [name], female   DOB: 1932/06/05, 81 y.o.   MRN: [id number]  [facility] PROGRESS NOTE  Victoria [name] RVU:[id number] DOB: Mar 04, 1932 DOA: 05/15/2015 PCP: Walker Kehr, MD   Brief narrative:    Pt is 81 yo female with known multiple myeloma in relapse, presented to [facility] ED for evaluation of AMS. Found to by hypoglycemic with possible UTI.   Assessment/Plan:    Multiple myeloma in relapse - per oncologist, recommendation is to hold of on further treatment - outpatient follow up - recommended palliative care consultation  Acute metabolic encephalopathy - secondary to UTI, progressive failure to thrive and deconditioning, hypoglycemia  - better this AM  UTI - recollect urine culture due to contamination - continue Cipro day #2  Hypoglycemia - better over the past 24 hours - monitor   Mild hypercalcemia - in the setting of possible UTI and hypoglycemia. - IVF have been provided and Ca is now WNL   Anemia due to chemotherapy - combination of anemia of chronic disease and related to treatment. - no indication for transfusion at this time  - CBC in AM  Leukopenia due to antineoplastic chemotherapy - due to recent treatment.  - pt is asymptomatic - observe   Thrombocytopenia, mild - due to malignancy, recent chemotherapy, dilution, infection, antibiotics - no signs of active bleeding  - plan on holding Lovenox if Plt < 50K  Conjunctivitis - Topical eye drops were started on 8/17  Malnutrition, severe PCM - in the context of chronic illness - nutritionist consulted   Deconditioning - PT/OT evaluation requested   DVT prophylaxis - Lovenox SQ  Code Status: Full.  Family Communication:  plan of care discussed with the patient Disposition Plan: Home when stable.   IV access:  Peripheral IV  Procedures and diagnostic studies:    Dg Chest 2 View 05/15/2015  Lungs hypoexpanded. Vascular congestion noted, with minimal bilateral atelectasis.     Medical Consultants:  Oncology   Other Consultants:  PT   IAnti-Infectives:   Cipro 8/17 -->  Faye Ramsay, MD  [facility] Pager [phone]  If 7PM-7AM, please contact night-coverage [email or web address] Password [email or web address] 05/17/2015, 1:45 PM   LOS: 1 day   HPI/Subjective: No events overnight.   Objective: Filed Vitals:   05/16/15 0909 05/16/15 1500 05/16/15 2205 05/17/15 0605  BP: 134/67 129/56 111/45 130/53  Pulse: 54 55 58 56  Temp: 97.7 F (36.5 C) 97.7 F (36.5 C) 98 F (36.7 C) 97.9 F (36.6 C)  TempSrc: Oral Oral Oral Oral  Resp: $Remo'20 20 20 20  'UjQeb$ Height: 5' (1.524 m)     Weight: 45.36 kg (100 lb)   45.3 kg (99 lb 13.9 oz)  SpO2: 99% 99% 100% 96%    Intake/Output Summary (Last 24 hours) at 05/17/15 1345 Last data filed at 05/17/15 9021  Gross per 24 hour  Intake 598.75 ml  Output      0 ml  Net 598.75 ml    Exam:   General:  Pt is alert, follows commands appropriately, not in acute distress  Cardiovascular: Regular rhythm, bradycardia, S1/S2, no rubs, no gallops  Respiratory: Clear to auscultation bilaterally, no wheezing, no crackles, no rhonchi  Abdomen: Soft, non tender, non distended, bowel sounds present, no guarding  Extremities: No edema, pulses DP and PT palpable bilaterally  Data Reviewed: Basic Metabolic Panel:  Recent Labs Lab 05/14/15 1416 05/15/15 2157 05/16/15 0501 05/17/15 0452  NA 140 143 141 140  K 3.5 3.6 3.2* 3.9  CL 106 109 108 111  CO2 26 21* 24 25  GLUCOSE 66 35* 104* 76  BUN 31* 29* 28* 22*  CREATININE 0.64 0.85 0.77 0.72  CALCIUM 10.9* 10.9* 10.4* 10.0   Liver Function Tests:  Recent Labs Lab 05/16/15 0501  AST 22  ALT 13*  ALKPHOS 47  BILITOT 0.8  PROT 8.1  ALBUMIN 2.6*   CBC:  Recent Labs Lab 05/14/15 1416 05/15/15 2157 05/16/15 0501 05/17/15 0452  WBC 2.9* 3.4* 2.8* 2.9*  NEUTROABS 1.8 2.2  --   --   HGB 10.0* 10.1* 9.1* 8.4*  HCT 32.9* 33.3* 30.1* 28.0*  MCV 96.5 97.4 95.6 95.9  PLT 143* 154  141* 132*   CBG:  Recent Labs Lab 05/16/15 1605 05/16/15 2023 05/17/15 0014 05/17/15 0416 05/17/15 1147  GLUCAP 83 113* 100* 76 102*    Recent Results (from the past 240 hour(s))  Urine culture     Status: None   Collection Time: 05/14/15  6:18 PM  Result Value Ref Range Status   Specimen Description URINE, CLEAN CATCH  Final   Special Requests NONE  Final   Culture   Final    MULTIPLE SPECIES PRESENT, SUGGEST RECOLLECTION Performed at [facility]    Report Status 05/16/2015 FINAL  Final     Scheduled Meds: . ciprofloxacin  400 mg Intravenous Q12H  . enoxaparin (LOVENOX) injection  40 mg Subcutaneous Q24H  . sodium chloride  3 mL Intravenous Q12H  . tobramycin   Left Eye QID   Continuous Infusions:

## 2015-05-17 NOTE — Progress Notes (Signed)
Victoria [name]   DOB:08-14-1932   WZ#:[id number]   VPM#:[id number]  Patient Care Team: Tresa Garter, MD as PCP - General Hart Carwin, MD as Consulting Physician (Gastroenterology) Marykay Lex, MD as Consulting Physician (Cardiology) Artis Delay, MD as Consulting Physician (Hematology and Oncology)  I have seen the patient, examined her and edited the notes as follows  Subjective: Patient seen and examined. She is feeling better this morning. Denies fevers, chills, night sweats, vision changes, or mucositis. Her conjunctivitis is improving. Denies any respiratory complaints. Denies any chest pain or palpitations. Denies lower extremity swelling. Denies nausea, heartburn or change in bowel habits. Appetite is normal. Denies any dysuria. Denies abnormal skin rashes, or neuropathy. Denies any bleeding issues such as epistaxis, hematemesis, hematuria or hematochezia.     Scheduled Meds: . ciprofloxacin  400 mg Intravenous Q12H  . enoxaparin (LOVENOX) injection  40 mg Subcutaneous Q24H  . sodium chloride  3 mL Intravenous Q12H  . tobramycin   Left Eye QID   Continuous Infusions:  PRN Meds:.acetaminophen **OR** acetaminophen, LORazepam, ondansetron **OR** ondansetron (ZOFRAN) IV, traMADol  Objective:  Filed Vitals:   05/17/15 0605  BP: 130/53  Pulse: 56  Temp: 97.9 F (36.6 C)  Resp: 20      Intake/Output Summary (Last 24 hours) at 05/17/15 9691 Last data filed at 05/17/15 3670  Gross per 24 hour  Intake 718.75 ml  Output    450 ml  Net 268.75 ml    ECOG PERFORMANCE STATUS: 1 GENERAL: Appears more comfortable since admission SKIN: skin color, texture, turgor are dry, no rashes or significant lesions EYES: Noted left eye conjunctivitis, improved OROPHARYNX:no exudate, no erythema and lips, buccal mucosa, and tongue normal  NECK: supple, thyroid normal size, non-tender, without nodularity LYMPH: no palpable lymphadenopathy in the cervical, axillary or inguinal LUNGS:  clear to auscultation and percussion with normal breathing effort HEART: regular rate & rhythm and no murmurs and no lower extremity edema ABDOMEN: soft, tender at the lower quadrants and normal bowel sounds Musculoskeletal:no cyanosis of digits and no clubbing.Tender at the left pretibial area, and left hip, daughter reports this is the area of fall. No cords palpable PSYCH: lethargic, non conversant at this time NEURO: No focal motor/sensory deficits   CBG (last 3)   Recent Labs  05/16/15 2023 05/17/15 0014 05/17/15 0416  GLUCAP 113* 100* 76     Labs:   Recent Labs Lab 05/14/15 1416 05/15/15 2157 05/16/15 0501 05/17/15 0452  WBC 2.9* 3.4* 2.8* 2.9*  HGB 10.0* 10.1* 9.1* 8.4*  HCT 32.9* 33.3* 30.1* 28.0*  PLT 143* 154 141* 132*  MCV 96.5 97.4 95.6 95.9  MCH 29.3 29.5 28.9 28.8  MCHC 30.4 30.3 30.2 30.0  RDW 16.4* 16.6* 16.4* 16.5*  LYMPHSABS 0.6* 0.8  --   --   MONOABS 0.4 0.4  --   --   EOSABS 0.0 0.0  --   --   BASOSABS 0.0 0.0  --   --      Chemistries:    Recent Labs Lab 05/14/15 1416 05/15/15 2157 05/16/15 0501 05/17/15 0452  NA 140 143 141 140  K 3.5 3.6 3.2* 3.9  CL 106 109 108 111  CO2 26 21* 24 25  GLUCOSE 66 35* 104* 76  BUN 31* 29* 28* 22*  CREATININE 0.64 0.85 0.77 0.72  CALCIUM 10.9* 10.9* 10.4* 10.0  AST  --   --  22  --   ALT  --   --  13*  --   ALKPHOS  --   --  47  --   BILITOT  --   --  0.8  --     GFR Estimated Creatinine Clearance: 39.4 mL/min (by C-G formula based on Cr of 0.72).  Liver Function Tests:  Recent Labs Lab 05/16/15 0501  AST 22  ALT 13*  ALKPHOS 47  BILITOT 0.8  PROT 8.1  ALBUMIN 2.6*     Recent Labs Lab 05/16/15 1138 05/16/15 1605 05/16/15 2023 05/17/15 0014 05/17/15 0416  GLUCAP 84 83 113* 100* 76   Microbiology Non diagnostic    Imaging Studies:  Dg Chest 2 View  05/15/2015   CLINICAL DATA:  Acute onset of altered mental status and weakness. Initial encounter.  EXAM: CHEST  2 VIEW   COMPARISON:  Chest radiograph performed 06/20/2011  FINDINGS: The lungs are hypoexpanded. Vascular crowding and vascular congestion are seen, with minimal bilateral atelectasis. There is no evidence of pleural effusion or pneumothorax.  The heart is normal in size; the mediastinal contour is within normal limits. No acute osseous abnormalities are seen.  IMPRESSION: Lungs hypoexpanded. Vascular congestion noted, with minimal bilateral atelectasis.   Electronically Signed   By: Garald Balding M.D.   On: 05/15/2015 22:35    Assessment/Plan: 81 y.o.  Multiple myeloma in relapse Unfortunately, since we held Revlimid, her serum protein electrophoresis, M spike and free light chains are worse. She was placed back on Revlimid on 8/1 at a lower dose 2.5 mg daily for 21 days, 7 days off along with Ninlaro and dexamethasone. Revlimid was held again since 8/15 Recommend hold all chemotherapy until acute issues resolve Will see her back as outpatient for further assessment  UTI, likely Cultures  Being recollected due to multiple species present Continue IV Cipro, IV fluids as per primary team  Hypoglycemia, controlled She was admitted with confusion, agitation She was found to have severe hypoglycemia requiring D50, now on D10 IV fluids Plan as per primary team  Acute encephalopathy Psychotic disorder Mild hypercalcemia This is in the setting of possible UTI and hypoglycemia.  The corrected blood calcium also showed hypercalcemia, slowly improving with hydration CT head negative for acute intracranial findings Continue supportive care with IV antibiotics, IV fluids as above Revlimid is on hold. Agree with IV fluid resuscitation. If calcium level does not improve or increases she may need IV bisphosphonates/steroids Rx Plan to recheck again tomorrow  Anemia due to chemotherapy This is combination of anemia of chronic disease related to treatment. Dose of chemotherapy has been lowered as  above. No bleeding issues are reported No transfusion in indicated at this time Will monitor  Leukopenia due to antineoplastic chemotherapy This is likely due to recent treatment.  No fevers, cough, chills, diarrhea or dysuria had been reported.  She is asymptomatic from the leukopenia. Will observe for now.   Thrombocytopenia, mild This is due to malignancy, recent chemotherapy, dilution, infection, antibiotics No bleeding issues are noted Monitor counts closely No transfusion is indicated at this time Transfuse 1 unit of platelets if count is less or equal than 10,000 or 20,000 if the patient is acutely bleeding  Hold Lovenox if platelets drop to less than 50,000  Conjunctivitis Topical eye drops were started on 8/17  Malnutrition Consider Nutrition evaluation  Deconditioning OT/PT to consult today  DVT prophylaxis On Lovenox  Full Code  Discharge planning I have a long discussion with the daughter who is her principal caregiver. Her mother is getting weaker. All  treatment is placed on hold. I recommend her to strongly consider palliative care and hospice. If she wants her mother to receive further treatment in the future, I might consider switching her to immunotherapy such as Daratumumab I will keep her appointment as scheduled on 05/29/2015 for further management. I would defer to primary service for all medical issues    Rondel Jumbo, PA-C 05/17/2015  6:47 AM Aarya Robinson, MD 05/17/2015

## 2015-05-17 NOTE — Evaluation (Signed)
Occupational Therapy Evaluation Patient Details Name: Victoria [name] MRN: [id number] DOB: May 20, 1932 Today's Date: 05/17/2015    History of Present Illness 81 yo female admitted with hypoglycemia, agitation, fall. Hx of HTN, anxiety, multiple myeloma, anorexia, dementia.    Clinical Impression   Pt admitted with hypoglycemia. Pt currently with functional limitations due to the deficits listed below (see OT Problem List).  Pt will benefit from skilled OT to increase their safety and independence with ADL and functional mobility for ADL to facilitate discharge to venue listed below.      Follow Up Recommendations  SNF;Home health OT;Supervision/Assistance - 24 hour    Equipment Recommendations  None recommended by OT       Precautions / Restrictions Precautions Precautions: Fall      Mobility Bed Mobility Overal bed mobility: Needs Assistance Bed Mobility: Supine to Sit     Supine to sit: Mod assist;HOB elevated     General bed mobility comments: upon almost sitting EOB pt changed her mind and returned to supine  Transfers                 General transfer comment: did not perform          ADL Overall ADL's : Needs assistance/impaired Eating/Feeding: Minimal assistance;Bed level   Grooming: Wash/dry face;Bed level;Minimal assistance                                 General ADL Comments: pt initially agreed to sitting EOB but then declined and said she wasnt going to move.  Pt also refusing to eat as she thinks someone spit on her sandwich                  Hand Dominance     Extremity/Trunk Assessment Upper Extremity Assessment Upper Extremity Assessment: Generalized weakness           Communication Communication Communication: No difficulties   Cognition Arousal/Alertness: Awake/alert Behavior During Therapy: WFL for tasks assessed/performed Overall Cognitive Status: History of cognitive impairments - at baseline                                 [place] expects to be discharged to:: Private residence Living Arrangements: Children Available Help at Discharge: Family Type of Home: House Home Access: Stairs to enter Technical brewer of Steps: 4   Home Layout: One level               Home Equipment: Environmental consultant - 2 wheels;Cane - single point;Bedside commode   Additional Comments: info taken from previous admission ~ 37month ago. Daughter present during session but did not offer any info when pt was questioned           OT Diagnosis: Generalized weakness;Cognitive deficits   OT Problem List: Decreased strength;Decreased activity tolerance;Decreased cognition   OT Treatment/Interventions: Self-care/ADL training;DME and/or AE instruction;Patient/family education    OT Goals(Current goals can be found in the care plan section) Acute Rehab OT Goals Patient Stated Goal: get something to eat that I can eat- OT offered multiple options - pt declined OT Goal Formulation: With patient Time For Goal Achievement: 05/31/15 Potential to Achieve Goals: Fair ADL Goals Pt Will Perform Eating: with supervision;sitting Pt Will Perform Grooming: with supervision;sitting Pt Will Transfer to Toilet: with min guard assist;bedside commode Pt Will Perform Toileting - Clothing Manipulation and  hygiene: with min guard assist;sit to/from stand  OT Frequency: Min 2X/week              End of Session Nurse Communication: Mobility status  Activity Tolerance: Treatment limited secondary to agitation Patient left: in bed   Time: 1340-1355 OT Time Calculation (min): 15 min Charges:  OT General Charges $OT Visit: 1 Procedure OT Evaluation $Initial OT Evaluation Tier I: 1 Procedure G-Codes:    Betsy Pries 06/15/15, 1:58 PM

## 2015-05-18 DIAGNOSIS — E43 Unspecified severe protein-calorie malnutrition: Secondary | ICD-10-CM | POA: Insufficient documentation

## 2015-05-18 LAB — CBC
HCT: 29.7 % — ABNORMAL LOW (ref 36.0–46.0)
HEMOGLOBIN: 8.8 g/dL — AB (ref 12.0–15.0)
MCH: 28.9 pg (ref 26.0–34.0)
MCHC: 29.6 g/dL — ABNORMAL LOW (ref 30.0–36.0)
MCV: 97.4 fL (ref 78.0–100.0)
PLATELETS: 140 10*3/uL — AB (ref 150–400)
RBC: 3.05 MIL/uL — AB (ref 3.87–5.11)
RDW: 17.2 % — ABNORMAL HIGH (ref 11.5–15.5)
WBC: 3.4 10*3/uL — ABNORMAL LOW (ref 4.0–10.5)

## 2015-05-18 LAB — BASIC METABOLIC PANEL
Anion gap: 7 (ref 5–15)
BUN: 19 mg/dL (ref 6–20)
CHLORIDE: 111 mmol/L (ref 101–111)
CO2: 22 mmol/L (ref 22–32)
Calcium: 10 mg/dL (ref 8.9–10.3)
Creatinine, Ser: 0.73 mg/dL (ref 0.44–1.00)
GFR calc Af Amer: >60 mL/min (ref >60)
GFR calc non Af Amer: >60 mL/min (ref >60)
GLUCOSE: 80 mg/dL (ref 65–99)
POTASSIUM: 4 mmol/L (ref 3.5–5.1)
Sodium: 140 mmol/L (ref 135–145)

## 2015-05-18 LAB — GLUCOSE, CAPILLARY
GLUCOSE-CAPILLARY: 99 mg/dL (ref 65–99)
GLUCOSE-CAPILLARY: 78 mg/dL (ref 65–99)
GLUCOSE-CAPILLARY: 162 mg/dL — AB (ref 65–99)
Glucose-Capillary: 127 mg/dL — ABNORMAL HIGH (ref 65–99)

## 2015-05-18 MED ORDER — QUETIAPINE FUMARATE 25 MG PO TABS
25.0000 mg | ORAL_TABLET | Freq: Two times a day (BID) | ORAL | Status: DC
  Filled 2015-05-18 (×3): qty 1

## 2015-05-18 MED ORDER — BOOST / RESOURCE BREEZE PO LIQD
1.0000 | Freq: Two times a day (BID) | ORAL | Status: DC
  Administered 2015-05-18 – 2015-05-21 (×2): 1 via ORAL

## 2015-05-18 MED ORDER — DOXYCYCLINE HYCLATE 100 MG PO TABS
100.0000 mg | ORAL_TABLET | Freq: Two times a day (BID) | ORAL | Status: DC
  Administered 2015-05-18 – 2015-05-19 (×2): 100 mg via ORAL
  Filled 2015-05-18 (×3): qty 1

## 2015-05-18 NOTE — Clinical Social Work Psych Assess (Signed)
Clinical Social Work Nature conservation officer  Clinical Social Worker:  Boone Master, [place] Date/Time:  05/18/2015, 4:25 PM Referred By:  Physician Date Referred:  05/18/15 Reason for Referral:  Behavioral Health Issues   Presenting Symptoms/Problems  Presenting Symptoms/Problems(in person's/family's own words):  Psych consulted due to paranoia   Abuse/Neglect/Trauma History  Abuse/Neglect/Trauma History:  Denies History Abuse/Neglect/Trauma History Comments (indicate dates):  N/A   Psychiatric History  Psychiatric History:  Denies History Psychiatric Medication:  None currently.   Current Mental Health Hospitalizations/Previous Mental Health History:  Patient saw a psychiatrist about 6 years ago when she had confusion/paranoia. Patient was cleared and did not take any medication and did not have any follow up.   Current Provider:  None currently Place and Date:  N/A  Current Medications:   Scheduled Meds: . doxycycline  100 mg Oral Q12H  . enoxaparin (LOVENOX) injection  40 mg Subcutaneous Q24H  . feeding supplement  1 Container Oral BID BM  . sodium chloride  3 mL Intravenous Q12H   Continuous Infusions:  PRN Meds:.acetaminophen **OR** acetaminophen, LORazepam, ondansetron **OR** ondansetron (ZOFRAN) IV, traMADol     Previous Inpatient Admission/Date/Reason:  None reported   Emotional Health/Current Symptoms  Suicide/Self Harm: None Reported Suicide Attempt in Past (date/description):  Patient denies any SI or HI.  Other Harmful Behavior (ex. homicidal ideation) (describe):  N/A   Psychotic/Dissociative Symptoms  Psychotic/Dissociative Symptoms: Paranoia Other Psychotic/Dissociative Symptoms:  Patient's paranoia has improved but was refusing to eat or take medications yesterday.   Attention/Behavioral Symptoms  Attention/Behavioral Symptoms: Within Normal Limits Other Attention/Behavioral Symptoms:  Patient engaged during  assessment.   Cognitive Impairment  Cognitive Impairment:  Orientation - Self Other Cognitive Impairment:  Patient remains confused at times.   Mood and Adjustment  Mood and Adjustment:  Mood Congruent   Stress, Anxiety, Trauma, Any Recent Loss/Stressor  Stress, Anxiety, Trauma, Any Recent Loss/Stressor: None Reported Anxiety (frequency):  N/A  Phobia (specify):  N/A  Compulsive Behavior (specify):  N/A  Obsessive Behavior (specify):  N/A  Other Stress, Anxiety, Trauma, Any Recent Loss/Stressor:  N/A   Substance Abuse/Use  Substance Abuse/Use: None SBIRT Completed (please refer for detailed history): No Self-reported Substance Use (last use and frequency):  None reported  Urinary Drug Screen Completed: No Alcohol Level:  N/A   Environment/Housing/Living Arrangement  Environmental/Housing/Living Arrangement: Stable Housing Who is in the Home:  dtr  Emergency Contact:  Alice-dtr   Financial  Financial: Medicare   Patient's Strengths and Goals  Patient's Strengths and Goals (patient's own words):  Patient has supportive family.   Clinical Social Worker's Interpretive Summary  Clinical Social Workers Interpretive Summary:    CSW received referral to complete psychosocial assessment. Psych MD and CSW assessed patient together. Patient's dtr entered during assessment and provided information as well.  Patient lives at home with dtr. Patient has two children but most of other family has passed away. 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 Patient's dtr currently lives with her and helps take care of her needs. Patient became paranoid and confused as of Saturday. Patient was brought to the hospital and receiving treatment for UTI. Patient's dtr and RN feel that patient is improving.  Patient has no previous MH or dementia diagnosis. Patient's boyfriend passed away about 6 years ago and dtr reports similar behavior but she was  cleared by a psychiatrist. Patient agreeable to see psychiatrist in the community and information placed on AVS.  CSW will  continue to follow.   Disposition  Disposition: Recommend Psych CSW Continuing To Support While In [facility] [place]

## 2015-05-18 NOTE — Progress Notes (Addendum)
Initial Nutrition Assessment  DOCUMENTATION CODES:   Severe malnutrition in context of acute illness/injury  INTERVENTION:  - Will order Boost Breeze BID, each supplement provides 250 kcal and 9 grams of protein - Encourage PO intakes - RD will continue to monitor for needs  NUTRITION DIAGNOSIS:   Inadequate oral intake related to acute illness as evidenced by per patient/family report.  GOAL:   Patient will meet greater than or equal to 90% of their needs  MONITOR:   PO intake, Supplement acceptance, Weight trends, Labs, I & O's  REASON FOR ASSESSMENT:   Consult Assessment of nutrition requirement/status  ASSESSMENT:   Patient was seen in the ER yesterday after a fall and was found to be having increasing confusion and agitation.  Pt seen for consult. BMI indicates normal weight status. Pt was eating breakfast at time of visit; eggs, toast, home fries. She was having no difficulty with chewing or swallowing and had consumed ~35% at time of RD visit and continued eating after RD left the room.  Pt's daughter provides all information. Pt has had decreased appetite and intakes x6 days PTA. Daughter feels this was due to confusion caused by UTI. Notes in chart indicate that pt stated she was not eating well PTA because she thought her daughter was poisoning her. Daughter states that she had bought pt Ensure for use at home but had not yet offered them to pt before coming to the hospital. She is interested in pt receiving Boost Breeze; RD will order. She states pt likely lost weight over the past few days but unsure of amount of weight lost.  Per weight hx review, pt has lost 5 lbs (5% body weight) in the past 4 days which is significant for time frame. Noted moderate and severe muscle and moderate fat wasting and mild edema.  Not meeting needs PTA. Medications reviewed. Labs reviewed; CBGs: 76-126 mg/dL.    Diet Order:  Diet regular Room service appropriate?: Yes; Fluid  consistency:: Thin  Skin:  Wound (see comment) (MSAD to R labia)  Last BM:  PTA  Height:   Ht Readings from Last 1 Encounters:  05/16/15 5' (1.524 m)    Weight:   Wt Readings from Last 1 Encounters:  05/18/15 95 lb 0.3 oz (43.1 kg)    Ideal Body Weight:  45.45 kg (kg)  BMI:  Body mass index is 18.56 kg/(m^2).  Estimated Nutritional Needs:   Kcal:  1100-1300  Protein:  45-55 grams  Fluid:  2.2 L/day  EDUCATION NEEDS:   No education needs identified at this time     Jarome Matin, RD, LDN Inpatient Clinical Dietitian Pager # [phone] After hours/weekend pager # [phone]

## 2015-05-18 NOTE — Progress Notes (Addendum)
Patient ID: Victoria [name], female   DOB: 1932/05/15, 81 y.o.   MRN: [id number]  [facility] PROGRESS NOTE  Victoria [name] [id number] DOB: Dec 31, 1931 DOA: 05/15/2015 PCP: Walker Kehr, MD   Brief narrative:    Pt is 81 yo female with known multiple myeloma in relapse, presented to [facility] ED for evaluation of AMS. Found to be hypoglycemic with possible UTI.   Assessment/Plan:    Multiple myeloma in relapse - per oncologist, recommendation is to hold of on further treatment - outpatient follow up - recommended palliative care consultation, daughter to think about is  Acute metabolic encephalopathy - secondary to UTI, progressive failure to thrive and deconditioning, hypoglycemia  - psych consult requested, family concerned for underlying psych issue - resolved  UTI - recollect urine culture due to contamination - continue Cipro day #4 - daughter reports possible intolerance to Cipro, noted pt acting strange after IV administration - will stop IV Cipro and change to Levaquin PO to see if pt tolerates better   Hypoglycemia - better over the past 24 hours - monitor   Mild hypercalcemia - in the setting of possible UTI and hypoglycemia. - IVF have been provided and Ca is now WNL   Anemia due to chemotherapy - combination of anemia of chronic disease and related to treatment. - no indication for transfusion at this time  - CBC in AM  Leukopenia due to antineoplastic chemotherapy - due to recent treatment.  - pt is asymptomatic   Thrombocytopenia, mild - due to malignancy, recent chemotherapy, dilution, infection, antibiotics - no signs of active bleeding, Plt trending up   Conjunctivitis - Topical eye drops were started on 8/17  Malnutrition, severe PCM - in the context of chronic illness - nutritionist consulted   Deconditioning - PT/OT evaluation requested  - SNF recommended and daughter made aware, she is to think about SNF vs home with [facility] PT - will ask  PT team to see again to discuss with family recommendations   DVT prophylaxis - Lovenox SQ  Code Status: Full.  Family Communication:  plan of care discussed with the patient Disposition Plan: in 24 hours, home vs SNF  IV access:  Peripheral IV  Procedures and diagnostic studies:    Dg Chest 2 View 05/15/2015  Lungs hypoexpanded. Vascular congestion noted, with minimal bilateral atelectasis.    Medical Consultants:  Oncology  Psych  Other Consultants:  PT   IAnti-Infectives:   Cipro 8/16 --> 8/19 Doxycycline 8/19 -->  Faye Ramsay, MD  [facility] Pager [phone]  If 7PM-7AM, please contact night-coverage [email or web address] Password [email or web address] 05/18/2015, 10:18 AM   LOS: 2 days   HPI/Subjective: No events overnight.   Objective: Filed Vitals:   05/17/15 0605 05/17/15 1400 05/17/15 2106 05/18/15 0418  BP: 130/53 129/56 138/57 106/49  Pulse: 56 53 61 67  Temp: 97.9 F (36.6 C) 98.2 F (36.8 C) 98.4 F (36.9 C) 98.2 F (36.8 C)  TempSrc: Oral Oral Oral Oral  Resp: $Remo'20 18 18 18  'aFxzA$ Height:      Weight: 45.3 kg (99 lb 13.9 oz)   43.1 kg (95 lb 0.3 oz)  SpO2: 96% 98% 97% 96%    Intake/Output Summary (Last 24 hours) at 05/18/15 1018 Last data filed at 05/18/15 0045  Gross per 24 hour  Intake    100 ml  Output    450 ml  Net   -350 ml    Exam:   General:  Pt is alert, follows commands appropriately, not  in acute distress  Cardiovascular: Regular rhythm, S1/S2, no rubs, no gallops  Respiratory: Clear to auscultation bilaterally, no wheezing, diminished breath sounds at bases   Abdomen: Soft, non tender, non distended, bowel sounds present, no guarding  Extremities: No edema, pulses DP and PT palpable bilaterally  Data Reviewed: Basic Metabolic Panel:  Recent Labs Lab 05/14/15 1416 05/15/15 2157 05/16/15 0501 05/17/15 0452 05/18/15 0512  NA 140 143 141 140 140  K 3.5 3.6 3.2* 3.9 4.0  CL 106 109 108 111 111  CO2 26 21* $Remo'24 25 22  'EyJMA$ GLUCOSE 66 35* 104* 76 80   BUN 31* 29* 28* 22* 19  CREATININE 0.64 0.85 0.77 0.72 0.73  CALCIUM 10.9* 10.9* 10.4* 10.0 10.0   Liver Function Tests:  Recent Labs Lab 05/16/15 0501  AST 22  ALT 13*  ALKPHOS 47  BILITOT 0.8  PROT 8.1  ALBUMIN 2.6*   CBC:  Recent Labs Lab 05/14/15 1416 05/15/15 2157 05/16/15 0501 05/17/15 0452 05/18/15 0512  WBC 2.9* 3.4* 2.8* 2.9* 3.4*  NEUTROABS 1.8 2.2  --   --   --   HGB 10.0* 10.1* 9.1* 8.4* 8.8*  HCT 32.9* 33.3* 30.1* 28.0* 29.7*  MCV 96.5 97.4 95.6 95.9 97.4  PLT 143* 154 141* 132* 140*   CBG:  Recent Labs Lab 05/17/15 1147 05/17/15 2011 05/17/15 2356 05/18/15 0407 05/18/15 0938  GLUCAP 102* 126* 106* 99 78    Recent Results (from the past 240 hour(s))  Urine culture     Status: None   Collection Time: 05/14/15  6:18 PM  Result Value Ref Range Status   Specimen Description URINE, CLEAN CATCH  Final   Special Requests NONE  Final   Culture   Final    MULTIPLE SPECIES PRESENT, SUGGEST RECOLLECTION Performed at [facility]    Report Status 05/16/2015 FINAL  Final     Scheduled Meds: . ciprofloxacin  400 mg Intravenous Q12H  . enoxaparin (LOVENOX) injection  40 mg Subcutaneous Q24H  . sodium chloride  3 mL Intravenous Q12H   Continuous Infusions:

## 2015-05-18 NOTE — Progress Notes (Signed)
CSW received consult for SNF placement, CSW reviewed PT evaluation and recommended Home Health. Per RNCM, Juliann Pulse - patient is active with [facility] & plan is to return home at discharge.   No further CSW needs identified - CSW signing off.   Raynaldo Opitz, [facility] Clinical Social Worker cell #: [phone]

## 2015-05-18 NOTE — Care Management Important Message (Signed)
Important Message  Patient Details  Name: Victoria [name] MRN: [id number] Date of Birth: 1931/10/25   Medicare Important Message Given:  [facility] notification given    Camillo Flaming 05/18/2015, 11:53 AMImportant Message  Patient Details  Name: Victoria [name] MRN: [id number] Date of Birth: [birth year]   Medicare Important Message Given:  Yes-second notification given    Camillo Flaming 05/18/2015, 11:53 AM

## 2015-05-18 NOTE — Progress Notes (Signed)
Victoria [name]   DOB:1931/11/16   YB#:[id number]   AJG#:[id number]  Patient Care Team: Cassandria Anger, MD as PCP - General Lafayette Dragon, MD as Consulting Physician (Gastroenterology) Leonie Man, MD as Consulting Physician (Cardiology) Heath Lark, MD as Consulting Physician (Hematology and Oncology)  I have seen the patient, examined her and edited the notes as follows  Subjective: Patient seen and examined. She is feeling better this morning. Denies fevers, chills, night sweats, vision changes, or mucositis. Her conjunctivitis is improving. Denies any respiratory complaints. Denies any chest pain or palpitations. Denies lower extremity swelling. Denies nausea, heartburn. Last bowel movement was on 8/17. Appetite is normal. Denies any dysuria. Denies abnormal skin rashes,or neuropathy. Denies any bleeding issues such as epistaxis, hematemesis, hematuria or hematochezia.  Scheduled Meds: . ciprofloxacin  400 mg Intravenous Q12H  . enoxaparin (LOVENOX) injection  40 mg Subcutaneous Q24H  . sodium chloride  3 mL Intravenous Q12H   Continuous Infusions:  PRN Meds:.acetaminophen **OR** acetaminophen, LORazepam, ondansetron **OR** ondansetron (ZOFRAN) IV, traMADol  Objective:  Filed Vitals:   05/18/15 0418  BP: 106/49  Pulse: 67  Temp: 98.2 F (36.8 C)  Resp: 18      Intake/Output Summary (Last 24 hours) at 05/18/15 0851 Last data filed at 05/18/15 0045  Gross per 24 hour  Intake    100 ml  Output    450 ml  Net   -350 ml    ECOG PERFORMANCE STATUS: 1  GENERAL: Appears more comfortable since admission SKIN: skin color, texture, turgor are dry, no rashes or significant lesions EYES: Noted left eye conjunctivitis, improved OROPHARYNX:no exudate, no erythema and lips, buccal mucosa, and tongue normal  NECK: supple, thyroid normal size, non-tender, without nodularity LYMPH: no palpable lymphadenopathy in the cervical, axillary or inguinal LUNGS: clear to auscultation and  percussion with normal breathing effort HEART: regular rate & rhythm and no murmurs and no lower extremity edema ABDOMEN: soft, tender at the lower quadrants and normal bowel sounds Musculoskeletal:no cyanosis of digits and no clubbing.Tender at the left pretibial area, and left hip, daughter reports this is the area of fall. No cords palpable PSYCH: lethargic, non conversant at this time NEURO: No focal motor/sensory deficits   CBG (last 3)   Recent Labs  05/17/15 2011 05/17/15 2356 05/18/15 0407  GLUCAP 126* 106* 99     Labs:   Recent Labs Lab 05/14/15 1416 05/15/15 2157 05/16/15 0501 05/17/15 0452 05/18/15 0512  WBC 2.9* 3.4* 2.8* 2.9* 3.4*  HGB 10.0* 10.1* 9.1* 8.4* 8.8*  HCT 32.9* 33.3* 30.1* 28.0* 29.7*  PLT 143* 154 141* 132* 140*  MCV 96.5 97.4 95.6 95.9 97.4  MCH 29.3 29.5 28.9 28.8 28.9  MCHC 30.4 30.3 30.2 30.0 29.6*  RDW 16.4* 16.6* 16.4* 16.5* 17.2*  LYMPHSABS 0.6* 0.8  --   --   --   MONOABS 0.4 0.4  --   --   --   EOSABS 0.0 0.0  --   --   --   BASOSABS 0.0 0.0  --   --   --      Chemistries:    Recent Labs Lab 05/14/15 1416 05/15/15 2157 05/16/15 0501 05/17/15 0452 05/18/15 0512  NA 140 143 141 140 140  K 3.5 3.6 3.2* 3.9 4.0  CL 106 109 108 111 111  CO2 26 21* $Remo'24 25 22  'GjGFa$ GLUCOSE 66 35* 104* 76 80  BUN 31* 29* 28* 22* 19  CREATININE 0.64 0.85 0.77 0.72 0.73  CALCIUM 10.9* 10.9* 10.4* 10.0 10.0  AST  --   --  22  --   --   ALT  --   --  13*  --   --   ALKPHOS  --   --  47  --   --   BILITOT  --   --  0.8  --   --     GFR Estimated Creatinine Clearance: 37.5 mL/min (by C-G formula based on Cr of 0.73).  Liver Function Tests:  Recent Labs Lab 05/16/15 0501  AST 22  ALT 13*  ALKPHOS 47  BILITOT 0.8  PROT 8.1  ALBUMIN 2.6*     Recent Labs Lab 05/17/15 0416 05/17/15 1147 05/17/15 2011 05/17/15 2356 05/18/15 0407  GLUCAP 76 102* 126* 106* 99   Microbiology Non diagnostic    Imaging Studies:  No results  found.  Assessment/Plan: 81 y.o.  Multiple myeloma in relapse Unfortunately, since we held Revlimid, her serum protein electrophoresis, M spike and free light chains are worse. She was placed back on Revlimid on 8/1 at a lower dose 2.5 mg daily for 21 days, 7 days off along with Ninlaro and dexamethasone. Revlimid was held again since 8/15 Recommend hold all chemotherapy until acute issues resolve Will see her back as outpatient for further assessment We also discussed about potential palliative care and hospice.  UTI, likely Cultures  Being recollected due to multiple species present Continue IV Cipro day 3, IV fluids as per primary team She can be transitioned to by mouth anti-body aches as she is feeling better  Hypoglycemia, controlled She was admitted with confusion, agitation She was found to have severe hypoglycemia requiring D50, now on D10 IV fluids Plan as per primary team  Acute encephalopathy Psychotic disorder Mild hypercalcemia, resolved This is in the setting of possible UTI and hypoglycemia.  The corrected blood calcium also showed hypercalcemia, resolved with hydration CT head negative for acute intracranial findings Continue supportive care with IV antibiotics, IV fluids as above Revlimid is on hold. Agree with IV fluid resuscitation and this has normalized.  Continue to monitor  Her daughter requested inpatient psychiatrist evaluation prior to discharge  Anemia due to chemotherapy This is combination of anemia of chronic disease related to treatment. Dose of chemotherapy has been lowered as above. No bleeding issues are reported No transfusion in indicated at this time Will monitor  Leukopenia due to antineoplastic chemotherapy This is likely due to recent treatment.  No fevers, cough, chills, diarrhea or dysuria had been reported.  She is asymptomatic from the leukopenia. Will observe for now.   Thrombocytopenia, mild This is due to malignancy,  recent chemotherapy, dilution, infection, antibiotics No bleeding issues are noted Monitor counts closely No transfusion is indicated at this time Transfuse 1 unit of platelets if count is less or equal than 10,000 or 20,000 if the patient is acutely bleeding  Hold Lovenox if platelets drop to less than 50,000  Conjunctivitis, improving Topical eye drops were started on 8/17  Malnutrition Consider Nutrition evaluation  Deconditioning OT/PT consult to see  DVT prophylaxis On Lovenox  Full Code  Discharge planning Had a long discussion with the daughter who is her principal caregiver. Her mother is getting weaker. All treatment is placed on hold. Recommend her to strongly consider palliative care and hospice. If she wants her mother to receive further treatment in the future,  might consider switching her to immunotherapy such as Daratumumab Will keep her appointment as scheduled on 05/29/2015  for further management. Would defer to primary service for all medical issues including mild constipation, UTI, hypoglycemia.  Rondel Jumbo, PA-C 05/18/2015  8:51 AM Victoria Seamans, MD 05/18/2015

## 2015-05-18 NOTE — Progress Notes (Signed)
ED Cm called Claiborne Billings at [facility] to see if further assistance needed  No further assistance needed

## 2015-05-18 NOTE — Consult Note (Signed)
[facility] Face-to-Face Psychiatry Consult   Reason for Consult:  Psychosis Referring Physician:  Dr. 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 Protein-calorie malnutrition, severe [E43] 05/18/2015  . Hypoglycemia [E16.2] 05/16/2015  . Acute confusional state [F05] 05/14/2015  . Bilateral lower extremity edema [R60.0] 04/27/2015  . Conjunctivitis of left eye [H10.9] 03/30/2015  . Coronary artery calcification [I25.10, I25.84] 03/28/2015  . PVC's (premature ventricular contractions) [I49.3] 03/28/2015  . Weakness [R53.1] 03/28/2015  . Mild dementia [F03.90] 03/07/2015  . Protein calorie malnutrition [E46] 03/02/2015  . Dehydration [E86.0] 02/16/2015  . Diarrhea due to drug [K52.1] 02/16/2015  . Leukopenia due to antineoplastic chemotherapy [D72.819, T45.1X5A] 02/01/2015  . Anemia due to chemotherapy [D64.81, T45.1X5A] 01/24/2015  . Hypokalemia [E87.6] 12/30/2014  . Multiple myeloma in relapse [C90.02] 12/30/2014  . Dementia with behavioral disturbance [F03.91] 10/02/2014  . Hypercalcemia [E83.52] 07/11/2014  . ATAXIA [R27.9] 12/14/2009  . HEARING DEFICIT [H91.90] 05/31/2009  . Anxiety state [F41.1] 03/21/2008  . Anemia [D64.9] 03/08/2008  . INSOMNIA, PERSISTENT [G47.00] 03/08/2008  . 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 Essential hypertension [I10] 11/25/2007    Total Time spent with patient: 1 hour  Subjective:   Victoria [name] is a 81 y.o. female patient admitted with confusion and delusion.  HPI:  Victoria [name] is a 80 y.o. female seen face-to-face with the psychiatric social service and case discussed with patient daughter who walked into the room for psychiatric consultation and evaluation of increased confusion, paranoid delusions and memory deficits.  Reportedly patient has no previous history of acute psychiatric hospitalization but presented with urinary tract infection which caused the confusion and become delusional and paranoid about people are poisoning her. Patient reportedly noncompliant with her diet which leads to hypoglycemia. Patient is slowly getting better with antiemetics treatment but continued to be paranoid delusions and not trusting the staff members. Patient stated staff does not like her for no reason. Patient has been awake, alert, oriented to time place person but not to the situation. Patient has 3 out of 3 and immediate memory but 0 out of 3 on delayed memory. Patient has mild confusion during my evaluation. Patient has a limited understanding about her chronic medical conditions especially multiple myeloma, hypoglycemia and current anxiety state. Patient denies current suicidal/homicidal ideation, intention or plans. Patient is  independent for most of her ADL, and  does manages her medication on her own. Patient daughter stated that she may not be able to stay by herself without any supervision at this time. Patient might have some underlying cognitive deficits associated with her age and then become more confused and psychotic with urinary tract infection. Hoping patient will be cleared when her infection was treated completely.  HPI Elements:   Location:  Paranoid delusions. Quality:  Poor. Severity:  Unable to eat food due to delusional thinking. Timing:  Urinary tract infection. Duration:  3-5 days. Context:  Psychosocial and recurrent medical problems.  Past Medical History:  Past Medical History  Diagnosis Date  . Hypertension   . Insomnia   . Anxiety   . Anemia   . Multiple myeloma     In remission  . Diverticulosis 04/2001  . Perforation bowel   . Ringing in ears     Wears hearing aides to drown out   . Rectal  bleeding 02/03/2014  . Anemia in chronic illness 01/24/2015    Past Surgical History  Procedure  Laterality Date  . Sigmoid resection / rectopexy  01/2011    perforation/stoma  . Colonoscopy    . Partial hysterectomy    . Cesarean section    . Colostomy takedown  06/27/11  . Cholecystectomy  2012    laparoscopic   Family History:  Family History  Problem Relation Age of Onset  . Hypertension Other   . Prostate cancer Father   . Colitis Neg Hx   . Esophageal cancer Neg Hx   . Stomach cancer Neg Hx    Social History:  History  Alcohol Use No     History  Drug Use No    Social History   Social History  . Marital Status: Divorced    Spouse Name: N/A  . Number of Children: 2  . Years of Education: N/A   Social History Main Topics  . Smoking status: Never Smoker   . Smokeless tobacco: Never Used  . Alcohol Use: No  . Drug Use: No  . Sexual Activity: No   Other Topics Concern  . None   Social History Narrative   Divorced.  Lives with daughter.  Normally ambulates without assistance.   Additional Social History:                          Allergies:   Allergies  Allergen Reactions  . Amiodarone     rash  . Amoxicillin Hives and Itching  . Aspirin Other (See Comments)    Child hood allergy   . Atenolol     Hair loss  . Donepezil Other (See Comments)    agitation   . Nortriptyline     agitation  . Latex Dermatitis  . Zoloft [Sertraline Hcl] Anxiety    Increased agitation    Labs:  Results for orders placed or performed during the hospital encounter of 05/15/15 (from the past 48 hour(s))  Glucose, capillary     Status: None   Collection Time: 05/16/15  4:05 PM  Result Value Ref Range   Glucose-Capillary 83 65 - 99 mg/dL  Glucose, capillary     Status: Abnormal   Collection Time: 05/16/15  8:23 PM  Result Value Ref Range   Glucose-Capillary 113 (H) 65 - 99 mg/dL   Comment 1 Notify RN   Glucose, capillary     Status: Abnormal   Collection Time: 05/17/15 12:14 AM  Result Value Ref Range   Glucose-Capillary 100 (H) 65 - 99 mg/dL   Glucose, capillary     Status: None   Collection Time: 05/17/15  4:16 AM  Result Value Ref Range   Glucose-Capillary 76 65 - 99 mg/dL  CBC     Status: Abnormal   Collection Time: 05/17/15  4:52 AM  Result Value Ref Range   WBC 2.9 (L) 4.0 - 10.5 K/uL   RBC 2.92 (L) 3.87 - 5.11 MIL/uL   Hemoglobin 8.4 (L) 12.0 - 15.0 g/dL   HCT 28.0 (L) 36.0 - 46.0 %   MCV 95.9 78.0 - 100.0 fL   MCH 28.8 26.0 - 34.0 pg   MCHC 30.0 30.0 - 36.0 g/dL   RDW 16.5 (H) 11.5 - 15.5 %   Platelets 132 (L) 150 - 400 K/uL  Basic metabolic panel     Status: Abnormal   Collection Time: 05/17/15  4:52 AM  Result Value Ref Range  Sodium 140 135 - 145 mmol/L   Potassium 3.9 3.5 - 5.1 mmol/L    Comment: DELTA CHECK NOTED REPEATED TO VERIFY    Chloride 111 101 - 111 mmol/L   CO2 25 22 - 32 mmol/L   Glucose, Bld 76 65 - 99 mg/dL   BUN 22 (H) 6 - 20 mg/dL   Creatinine, Ser 0.72 0.44 - 1.00 mg/dL   Calcium 10.0 8.9 - 10.3 mg/dL   GFR calc non Af Amer >60 >60 mL/min   GFR calc Af Amer >60 >60 mL/min    Comment: (NOTE) The eGFR has been calculated using the CKD EPI equation. This calculation has not been validated in all clinical situations. eGFR's persistently <60 mL/min signify possible Chronic Kidney Disease.    Anion gap 4 (L) 5 - 15  Glucose, capillary     Status: Abnormal   Collection Time: 05/17/15 11:47 AM  Result Value Ref Range   Glucose-Capillary 102 (H) 65 - 99 mg/dL  Glucose, capillary     Status: Abnormal   Collection Time: 05/17/15  8:11 PM  Result Value Ref Range   Glucose-Capillary 126 (H) 65 - 99 mg/dL  Glucose, capillary     Status: Abnormal   Collection Time: 05/17/15 11:56 PM  Result Value Ref Range   Glucose-Capillary 106 (H) 65 - 99 mg/dL  Glucose, capillary     Status: None   Collection Time: 05/18/15  4:07 AM  Result Value Ref Range   Glucose-Capillary 99 65 - 99 mg/dL  CBC     Status: Abnormal   Collection Time: 05/18/15  5:12 AM  Result Value Ref Range   WBC 3.4 (L) 4.0  - 10.5 K/uL   RBC 3.05 (L) 3.87 - 5.11 MIL/uL   Hemoglobin 8.8 (L) 12.0 - 15.0 g/dL   HCT 29.7 (L) 36.0 - 46.0 %   MCV 97.4 78.0 - 100.0 fL   MCH 28.9 26.0 - 34.0 pg   MCHC 29.6 (L) 30.0 - 36.0 g/dL   RDW 17.2 (H) 11.5 - 15.5 %   Platelets 140 (L) 150 - 400 K/uL  Basic metabolic panel     Status: None   Collection Time: 05/18/15  5:12 AM  Result Value Ref Range   Sodium 140 135 - 145 mmol/L   Potassium 4.0 3.5 - 5.1 mmol/L    Comment: SLIGHT HEMOLYSIS HEMOLYSIS AT THIS LEVEL MAY AFFECT RESULT    Chloride 111 101 - 111 mmol/L   CO2 22 22 - 32 mmol/L   Glucose, Bld 80 65 - 99 mg/dL   BUN 19 6 - 20 mg/dL   Creatinine, Ser 0.73 0.44 - 1.00 mg/dL   Calcium 10.0 8.9 - 10.3 mg/dL   GFR calc non Af Amer >60 >60 mL/min   GFR calc Af Amer >60 >60 mL/min    Comment: (NOTE) The eGFR has been calculated using the CKD EPI equation. This calculation has not been validated in all clinical situations. eGFR's persistently <60 mL/min signify possible Chronic Kidney Disease.    Anion gap 7 5 - 15  Glucose, capillary     Status: None   Collection Time: 05/18/15  9:38 AM  Result Value Ref Range   Glucose-Capillary 78 65 - 99 mg/dL  Glucose, capillary     Status: Abnormal   Collection Time: 05/18/15 11:58 AM  Result Value Ref Range   Glucose-Capillary 127 (H) 65 - 99 mg/dL    Vitals: Blood pressure 144/60, pulse 66, temperature 98.2 F (36.8 C),  temperature source Oral, resp. rate 20, height 5' (1.524 m), weight 43.1 kg (95 lb 0.3 oz), SpO2 98 %.  Risk to Self: Is patient at risk for suicide?: No Risk to Others:   Prior Inpatient Therapy:   Prior Outpatient Therapy:    Current Facility-Administered Medications  Medication Dose Route Frequency Provider Last Rate Last Dose  . acetaminophen (TYLENOL) tablet 650 mg  650 mg Oral Q6H PRN Lavina Hamman, MD       Or  . acetaminophen (TYLENOL) suppository 650 mg  650 mg Rectal Q6H PRN Lavina Hamman, MD      . doxycycline (VIBRA-TABS) tablet  100 mg  100 mg Oral Q12H Theodis Blaze, MD   100 mg at 05/18/15 1155  . enoxaparin (LOVENOX) injection 40 mg  40 mg Subcutaneous Q24H Lavina Hamman, MD   40 mg at 05/18/15 1059  . feeding supplement (BOOST / RESOURCE BREEZE) liquid 1 Container  1 Container Oral BID BM Rosezetta Schlatter, RD   1 Container at 05/18/15 1045  . LORazepam (ATIVAN) tablet 1 mg  1 mg Oral Q8H PRN Lavina Hamman, MD   1 mg at 05/18/15 0049  . ondansetron (ZOFRAN) tablet 4 mg  4 mg Oral Q6H PRN Lavina Hamman, MD       Or  . ondansetron [facility]) injection 4 mg  4 mg Intravenous Q6H PRN Lavina Hamman, MD      . sodium chloride 0.9 % injection 3 mL  3 mL Intravenous Q12H Lavina Hamman, MD   3 mL at 05/18/15 1059  . traMADol (ULTRAM) tablet 50 mg  50 mg Oral Q6H PRN Lavina Hamman, MD   50 mg at 05/16/15 2356   Facility-Administered Medications Ordered in Other Encounters  Medication Dose Route Frequency Provider Last Rate Last Dose  . 0.9 %  sodium chloride infusion   Intravenous Once Heath Lark, MD        Musculoskeletal: Strength & Muscle Tone: within normal limits Gait & Station: unable to stand Patient leans: N/A  Psychiatric Specialty Exam: Physical Exam as per his history and physical   ROS forgetfulness, confusion, paranoid delusions but denied nausea vomiting abdominal pain chest pain and shortness of breath. No Fever-chills, No Headache, No changes with Vision or hearing, reports vertigo No problems swallowing food or Liquids, No Chest pain, Cough or Shortness of Breath, No Abdominal pain, No Nausea or Vommitting, Bowel movements are regular, No Blood in stool or Urine, No dysuria, No new skin rashes or bruises, No new joints pains-aches,  No new weakness, tingling, numbness in any extremity, No recent weight gain or loss, No polyuria, polydypsia or polyphagia,   A full 10 point Review of Systems was done, except as stated above, all other Review of Systems were negative.  Blood pressure  144/60, pulse 66, temperature 98.2 F (36.8 C), temperature source Oral, resp. rate 20, height 5' (1.524 m), weight 43.1 kg (95 lb 0.3 oz), SpO2 98 %.Body mass index is 18.56 kg/(m^2).  General Appearance: Guarded  Eye Contact::  Good  Speech:  Clear and Coherent  Volume:  Decreased  Mood:  Anxious and Depressed  Affect:  Non-Congruent and Inappropriate  Thought Process:  Coherent and Goal Directed  Orientation:  Full (Time, Place, and Person)  Thought Content:  Delusions, Paranoid Ideation and Rumination  Suicidal Thoughts:  No  Homicidal Thoughts:  No  Memory:  Immediate;   Fair Recent;   Poor  Judgement:  Impaired  Insight:  Shallow  Psychomotor Activity:  Decreased  Concentration:  Poor  Recall:  Poor  Fund of Knowledge:Fair  Language: Good  Akathisia:  Negative  Handed:  Right  AIMS (if indicated):     Assets:  Communication Skills Desire for Improvement Financial Resources/Insurance Housing Leisure Time Resilience Social Support Transportation  ADL's:  Intact  Cognition: Impaired,  Mild  Sleep:      Medical Decision Making: New Problem, with no additional work-up planned (3), Review of Last Therapy Session (1), Review or order medicine tests (1), Review of Medication Regimen & Side Effects (2) and Review of New Medication or Change in Dosage (2)  Treatment Plan Summary: Patient presented with increased symptoms of paranoid delusional psychosis and not able to eat because of delusional thinking of poison food, does not trust staff members in the hospital and believes they are working against her. Patient psychosis has been worsening since he got infected with the urinary tract infection  Daily contact with patient to assess and evaluate symptoms and progress in treatment and Medication management  Plan:  Case discussed with the psychiatric social service Patient daughter willing to obtain guardianship if needed Monitor for patient diet intake and frequently check  paranoid delusions about poisoning We start Seroquel 25 mg twice daily and monitor for the extrapyramidal symptoms Patient does not meet criteria for psychiatric inpatient admission. Supportive therapy provided about ongoing stressors. Appreciate psychiatric consultation Please contact [phone] or [phone] if needs further assistance  Disposition: patient benefit from the out-of-home placement for short-term stay as she cannot take care of herself due to urinary tract infection, memory deficits and paranoid delusions.   Damyia Strider,JANARDHAHA R. 05/18/2015 3:06 PM

## 2015-05-19 DIAGNOSIS — F062 Psychotic disorder with delusions due to known physiological condition: Secondary | ICD-10-CM

## 2015-05-19 LAB — URINE CULTURE

## 2015-05-19 MED ORDER — LORAZEPAM 1 MG PO TABS: 1.0000 mg | ORAL_TABLET | Freq: Three times a day (TID) | ORAL | Status: DC | PRN

## 2015-05-19 MED ORDER — TRAMADOL HCL 50 MG PO TABS: 50.0000 mg | ORAL_TABLET | Freq: Four times a day (QID) | ORAL | Status: DC | PRN

## 2015-05-19 MED ORDER — QUETIAPINE FUMARATE 25 MG PO TABS: 25.0000 mg | ORAL_TABLET | Freq: Two times a day (BID) | ORAL | Status: DC

## 2015-05-19 NOTE — Care Management Note (Signed)
Case Management Note  Patient Details  Name: Victoria [name] MRN: [id number] Date of Birth: Feb 08, 1932  Subjective/Objective:       Multiple myeloma in relapse               Action/Plan: Home Health   Expected Discharge Date:  05/19/2015            Expected Discharge Plan:  [place]  In-House Referral:     Discharge planning Services  CM Consult  Post Acute Care Choice:  Home Health (Active w/[facility]) Choice offered to:     DME Arranged:    DME Agency:     HH Arranged:  RN, PT, Nurse's Aide [place] Agency:  [place]  Status of Service:  Completed, signed off  Medicare Important Message Given:  Yes-second notification given Date Medicare IM Given:    Medicare IM give by:    Date Additional Medicare IM Given:    Additional Medicare Important Message give by:     If discussed at [place] of Stay Meetings, dates discussed:    Additional Comments: NCM spoke to dtr, Ebony Hail, states she lives in home with pt. Offered choice for [facility]. Dtr is requesting Alvis Lemmings for [facility]. Contacted [facility] to arrange [facility] for dc today. Contacted [facility] for RW with seat for home. Contacted [facility] to cancel [facility].  Erenest Rasher, RN 05/19/2015, 11:00 AM

## 2015-05-19 NOTE — Discharge Summary (Signed)
Physician Discharge Summary  Victoria [name] ZOX:[id number] DOB: Aug 17, 1932 DOA: 05/15/2015  PCP: Walker Kehr, MD  Admit date: 05/15/2015 Discharge date: 05/19/2015  Recommendations for Outpatient Follow-up:  1. Pt will need to follow up with PCP in 2-3 weeks post discharge 2. 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 Found to be hypoglycemic with possible UTI.   Assessment/Plan:    Multiple myeloma in relapse - per oncologist, recommendation is to hold of on further treatment - outpatient follow up  Acute metabolic encephalopathy - secondary to UTI, progressive failure to thrive and deconditioning, hypoglycemia  - psych consult requested, family concerned for underlying psych issue - worse on 8/20 and d/c held - we have stopped ABX per daughter request, I agree with observation off ABX   UTI - recollected urine culture due to contamination - completed 5 days course, observe for now off ABX  - daughter reports possible intolerance to Cipro and doxycycline   Hypoglycemia - stable in the past 48 hours   Mild hypercalcemia - in the setting of possible UTI and dehydration  - encouraged PO intake   Anemia due to chemotherapy - combination of anemia of chronic disease and related to treatment. - no indication for transfusion at this time   Leukopenia due to antineoplastic chemotherapy - due to recent treatment.  - pt is  asymptomatic   Thrombocytopenia, mild - due to malignancy, recent chemotherapy, dilution, infection, antibiotics - no signs of active bleeding, Plt trending up   Conjunctivitis - Topical eye drops were started on 8/17  Malnutrition, severe PCM - in the context of chronic illness - nutritionist consulted  - pt tolerating diet well   Deconditioning - family considering SNF   Code Status: Full.  Family Communication: plan of care discussed with the patient Disposition Plan: SNF  IV access:  Peripheral IV  Procedures and diagnostic studies:   Dg Chest 2 View 05/15/2015 Lungs hypoexpanded. Vascular congestion noted, with minimal bilateral atelectasis.   Medical Consultants:  Oncology  Psych  Other Consultants:  PT   IAnti-Infectives:   Cipro 8/16 --> 8/19 Doxycycline 8/19 --> 8/21       Discharge Exam: Filed Vitals:   05/19/15 0453  BP: 143/60  Pulse: 61  Temp: 98.1 F (36.7 C)  Resp: 18   Filed Vitals:   05/18/15 0418 05/18/15 1428 05/18/15 2020 05/19/15 0453  BP: 106/49 144/60 122/62 143/60  Pulse: 67 66 63 61  Temp: 98.2 F (36.8 C) 98.2 F (36.8 C) 97.5 F (36.4 C) 98.1 F (36.7 C)  TempSrc: Oral Oral Oral Oral  Resp: $Remo'18 20 20 18  'mAgDa$ Height:      Weight: 43.1 kg (95 lb 0.3 oz)   42.9 kg (94 lb 9.2 oz)  SpO2: 96% 98% 98% 97%    General: Pt is alert, follows commands appropriately, not in acute distress Cardiovascular: Regular rate and rhythm, S1/S2 +, no murmurs, no rubs, no gallops Respiratory: Clear to auscultation bilaterally, no  wheezing, no crackles, no rhonchi Abdominal: Soft, non tender, non distended, bowel sounds +, no guarding  Discharge Instructions  Discharge Instructions    Diet - low sodium heart healthy    Complete by:  As directed      Increase activity slowly    Complete by:  As directed             Medication List    STOP taking these medications        ergocalciferol 50000 UNITS capsule   Commonly known as:  VITAMIN D2     fosfomycin 3 G Pack  Commonly known as:  MONUROL     ixazomib citrate 4 MG capsule  Commonly known as:  NINLARO     lenalidomide 2.5 MG capsule  Commonly known as:  REVLIMID      TAKE these medications        cholecalciferol 1000 UNITS tablet  Commonly known as:  VITAMIN D  Take 2,000 Units by mouth daily.     dexamethasone 4 MG tablet  Commonly known as:  DECADRON  Take 5 tablets (20 mg total) by mouth daily.     LORazepam 1 MG tablet  Commonly known as:  ATIVAN  Take 1 tablet (1 mg total) by mouth every 8 (eight) hours as needed for anxiety.     MULTIVITAMIN & MINERAL PO  Take 1 tablet by mouth daily.     OVER THE COUNTER MEDICATION  Take 2 capsules by mouth daily. Potassium supplement     promethazine 25 MG tablet  Commonly known as:  PHENERGAN  Take 0.5 tablets (12.5 mg total) by mouth every 6 (six) hours as needed for nausea.     QUEtiapine 25 MG tablet  Commonly known as:  SEROQUEL  Take 1 tablet (25 mg total) by mouth 2 (two) times daily.     tobramycin 0.3 % ophthalmic solution  Commonly known as:  TOBREX  Place 1 drop into the left eye every 4 (four) hours.     traMADol 50 MG tablet  Commonly known as:  ULTRAM  Take 1 tablet (50 mg total) by mouth every 6 (six) hours as needed.           Follow-up Information    Call [facility].   Why:  To schedule an appointment with psychiatrist   Contact information:   [address], [place], [place] [address] [phone] - [phone]      Follow up with Sonda Primes, MD.   Specialty:  Internal Medicine   Contact information:   [address] [place] [address] [phone]       Follow up with Debbora Presto, MD.   Specialty:  Internal Medicine   Contact information:   [address] [place] [address] [phone]        The results of significant diagnostics from this hospitalization (including imaging,  microbiology, ancillary and laboratory) are listed below for reference.     Microbiology: Recent Results (from the past 240 hour(s))  Urine culture     Status: None   Collection Time: 05/14/15  6:18 PM  Result Value Ref Range Status   Specimen Description URINE, CLEAN CATCH  Final   Special Requests NONE  Final   Culture   Final    MULTIPLE SPECIES PRESENT, SUGGEST RECOLLECTION Performed at [facility]    Report Status 05/16/2015 FINAL  Final     Labs: Basic Metabolic Panel:  Recent Labs Lab 05/14/15 1416 05/15/15 2157 05/16/15 0501 05/17/15 0452 05/18/15 0512  NA 140 143 141 140 140  K 3.5 3.6 3.2* 3.9 4.0  CL 106 109 108 111 111  CO2 26 21* $Remo'24 25 22  'AWBCs$ GLUCOSE 66 35* 104* 76 80  BUN 31* 29* 28* 22* 19  CREATININE 0.64 0.85 0.77 0.72 0.73  CALCIUM 10.9* 10.9* 10.4* 10.0 10.0   Liver Function Tests:  Recent Labs Lab 05/16/15 0501  AST 22  ALT 13*  ALKPHOS 47  BILITOT 0.8  PROT 8.1  ALBUMIN 2.6*   CBC:  Recent Labs Lab 05/14/15 1416 05/15/15 2157 05/16/15 0501 05/17/15 0452 05/18/15 0512  WBC 2.9* 3.4* 2.8* 2.9* 3.4*  NEUTROABS 1.8 2.2  --   --   --   HGB 10.0* 10.1* 9.1* 8.4* 8.8*  HCT 32.9* 33.3* 30.1* 28.0* 29.7*  MCV 96.5 97.4 95.6 95.9 97.4  PLT 143* 154 141* 132* 140*   CBG:  Recent Labs Lab 05/17/15 2356 05/18/15 0407 05/18/15 0938 05/18/15 1158 05/18/15 1623  GLUCAP 106* 99 78 127* 162*     SIGNED: Time coordinating discharge: 30 minutes  MAGICK-Shakedra Beam, MD  [facility] 05/19/2015, 9:56 AM Pager [phone]  If 7PM-7AM, please contact night-coverage [email or web address] Password [email or web address]

## 2015-05-19 NOTE — Discharge Instructions (Signed)

## 2015-05-19 NOTE — Progress Notes (Signed)
OT Cancellation Note  Patient Details Name: Victoria [name] MRN: [id number] DOB: 05-Dec-1931   Cancelled Treatment:    Reason Eval/Treat Not Completed: Other (comment).  Pt was yelling out when I arrived; visitor present and said that pt was agitated from medications. Will check back another day.  Zavier Canela 05/19/2015, 2:36 PM  Lesle Chris, OTR/L [phone] 05/19/2015

## 2015-05-20 LAB — CBC
HEMATOCRIT: 32.9 % — AB (ref 36.0–46.0)
Hemoglobin: 10 g/dL — ABNORMAL LOW (ref 12.0–15.0)
MCH: 29.2 pg (ref 26.0–34.0)
MCHC: 30.4 g/dL (ref 30.0–36.0)
MCV: 96.2 fL (ref 78.0–100.0)
PLATELETS: 248 10*3/uL (ref 150–400)
RBC: 3.42 MIL/uL — ABNORMAL LOW (ref 3.87–5.11)
RDW: 17.4 % — AB (ref 11.5–15.5)
WBC: 3.6 10*3/uL — ABNORMAL LOW (ref 4.0–10.5)

## 2015-05-20 LAB — BASIC METABOLIC PANEL
Anion gap: 4 — ABNORMAL LOW (ref 5–15)
BUN: 15 mg/dL (ref 6–20)
CALCIUM: 10.4 mg/dL — AB (ref 8.9–10.3)
CO2: 25 mmol/L (ref 22–32)
CREATININE: 0.62 mg/dL (ref 0.44–1.00)
Chloride: 111 mmol/L (ref 101–111)
GFR calc non Af Amer: >60 mL/min (ref >60)
GLUCOSE: 102 mg/dL — AB (ref 65–99)
Potassium: 3.7 mmol/L (ref 3.5–5.1)
Sodium: 140 mmol/L (ref 135–145)

## 2015-05-20 NOTE — Progress Notes (Signed)
Patient ID: Victoria [name], female   DOB: 02-06-1932, 81 y.o.   MRN: [id number]  [facility] PROGRESS NOTE  Victoria [name] DXI:[id number] DOB: 1932-03-24 DOA: 05/15/2015 PCP: Walker Kehr, MD   Brief narrative:    Pt is 81 yo female with known multiple myeloma in relapse, presented to [facility] ED for evaluation of AMS. Found to be hypoglycemic with possible UTI.   Assessment/Plan:    Multiple myeloma in relapse - per oncologist, recommendation is to hold of on further treatment - outpatient follow up  Acute metabolic encephalopathy - secondary to UTI, progressive failure to thrive and deconditioning, hypoglycemia  - psych consult requested, family concerned for underlying psych issue - worse on 8/20 and possibly medication induced (was given one dose of doxycycline) - we have stopped ABX per daughter request, I agree with observation off ABX  - observe  - d/c on hold as family now considering placement to SNF   UTI - recollect urine culture due to contamination - completed 5 days course, observe for now off ABX  - daughter reports possible intolerance to Cipro and doxycycline   Hypoglycemia - stable in the past 48 hours   Mild hypercalcemia - in the setting of possible UTI and dehydration  - repeat BMP in AM  Anemia due to chemotherapy - combination of anemia of chronic disease and related to treatment. - no indication for transfusion at this time   Leukopenia due to antineoplastic chemotherapy - due to recent treatment.  - pt is asymptomatic   Thrombocytopenia, mild - due to malignancy, recent chemotherapy, dilution, infection, antibiotics - no signs of active bleeding, Plt trending up   Conjunctivitis - Topical eye drops were started on 8/17  Malnutrition, severe PCM - in the context of chronic illness - nutritionist consulted   Deconditioning - family considering SNF   DVT prophylaxis - Lovenox SQ  Code Status: Full.  Family Communication:  plan of  care discussed with the patient Disposition Plan: in 24 hours, home vs SNF  IV access:  Peripheral IV  Procedures and diagnostic studies:    Dg Chest 2 View 05/15/2015  Lungs hypoexpanded. Vascular congestion noted, with minimal bilateral atelectasis.    Medical Consultants:  Oncology  Psych  Other Consultants:  PT   IAnti-Infectives:   Cipro 8/16 --> 8/19 Doxycycline 8/19 --> 8/21  Faye Ramsay, MD  [facility] Pager [phone]  If 7PM-7AM, please contact night-coverage [email or web address] Password [email or web address] 05/20/2015, 5:50 PM   LOS: 4 days   HPI/Subjective: No events overnight.   Objective: Filed Vitals:   05/19/15 1715 05/19/15 1900 05/20/15 0415 05/20/15 0608  BP: 134/58 135/64  122/53  Pulse: 68 69  61  Temp: 97 F (36.1 C) 97.7 F (36.5 C)  97.6 F (36.4 C)  TempSrc: Axillary Axillary  Oral  Resp: _0 Height:      Weight:   45 kg (99 lb 3.3 oz)   SpO2: 98% 100%  95%    Intake/Output Summary (Last 24 hours) at 05/20/15 1750 Last data filed at 05/19/15 2344  Gross per 24 hour  Intake    363 ml  Output      0 ml  Net    363 ml    Exam:   General:  Pt is not in acute distress  Cardiovascular: Regular rhythm, S1/S2, no rubs, no gallops  Respiratory: Clear to auscultation bilaterally, no wheezing, diminished breath sounds at bases   Abdomen: Soft, non tender, non distended, bowel  sounds present, no guarding  Extremities: No edema, pulses DP and PT palpable bilaterally  Data Reviewed: Basic Metabolic Panel:  Recent Labs Lab 05/15/15 2157 05/16/15 0501 05/17/15 0452 05/18/15 0512 05/20/15 1109  NA 143 141 140 140 140  K 3.6 3.2* 3.9 4.0 3.7  CL 109 108 111 111 111  CO2 21* _0 GLUCOSE 35* 104* 76 80 102*  BUN 29* 28* 22* 19 15  CREATININE 0.85 0.77 0.72 0.73 0.62  CALCIUM 10.9* 10.4* 10.0 10.0 10.4*   Liver Function Tests:  Recent Labs Lab 05/16/15 0501  AST 22  ALT 13*  ALKPHOS 47  BILITOT 0.8  PROT 8.1  ALBUMIN 2.6*    CBC:  Recent Labs Lab 05/14/15 1416 05/15/15 2157 05/16/15 0501 05/17/15 0452 05/18/15 0512 05/20/15 1109  WBC 2.9* 3.4* 2.8* 2.9* 3.4* 3.6*  NEUTROABS 1.8 2.2  --   --   --   --   HGB 10.0* 10.1* 9.1* 8.4* 8.8* 10.0*  HCT 32.9* 33.3* 30.1* 28.0* 29.7* 32.9*  MCV 96.5 97.4 95.6 95.9 97.4 96.2  PLT 143* 154 141* 132* 140* 248   CBG:  Recent Labs Lab 05/17/15 2356 05/18/15 0407 05/18/15 0938 05/18/15 1158 05/18/15 1623  GLUCAP 106* 99 78 127* 162*    Recent Results (from the past 240 hour(s))  Urine culture     Status: None   Collection Time: 05/14/15  6:18 PM  Result Value Ref Range Status   Specimen Description URINE, CLEAN CATCH  Final   Special Requests NONE  Final   Culture   Final    MULTIPLE SPECIES PRESENT, SUGGEST RECOLLECTION Performed at [facility]    Report Status 05/16/2015 FINAL  Final  Culture, Urine     Status: None   Collection Time: 05/18/15 10:11 AM  Result Value Ref Range Status   Specimen Description URINE, CLEAN CATCH  Final   Special Requests NONE  Final   Culture   Final    MULTIPLE SPECIES PRESENT, SUGGEST RECOLLECTION Performed at [facility]    Report Status 05/19/2015 FINAL  Final     Scheduled Meds: . enoxaparin (LOVENOX) injection  40 mg Subcutaneous Q24H  . feeding supplement  1 Container Oral BID BM  . QUEtiapine  25 mg Oral BID  . sodium chloride  3 mL Intravenous Q12H   Continuous Infusions:

## 2015-05-20 NOTE — Care Management Note (Addendum)
Case Management Note  Patient Details  Name: Victoria [name] MRN: [id number] Date of Birth: 1932/09/14  Subjective/Objective:    Multiple myeloma in relapse                 Action/Plan: Home Health  Expected Discharge Date:  05/20/2015               Expected Discharge Plan:  [place]  In-House Referral:     Discharge planning Services  CM Consult  Post Acute Care Choice:  Home Health Choice offered to:  Adult Children  DME Arranged:  [place] DME Agency:  Conservation officer, [facility] with [facility]  HH Arranged:  RN, PT, Nurse's Aide HH Agency:  [place]  Status of Service:  Completed, signed off  Medicare Important Message Given:  Yes-third notification given Date Medicare IM Given:    Medicare IM give by:    Date Additional Medicare IM Given:    Additional Medicare Important Message give by:     If discussed at [place] of Stay Meetings, dates discussed:    Additional Comments: 05/20/2015 1730 NCM spoke to dtr, Ebony Hail. She wants SNF-rehab. She believes her mother would benefit from rehab prior to dc to home. CSW referral for SNF placement.  Jonnie Finner RN CCM Case Mgmt phone [phone]  NCM spoke to dtr, [place]. Gave Medicare IM and explained. Dtr states she is still undecided about [facility] or SNF-rehab. Referral to CSW for possible SNF placement. Dtr states she wants to discuss with her brother this evening at 6 pm about dc home or SNF-rehab. States she is the home with the pt currently 24 hours but will possibly be going back to work.  Erenest Rasher, RN 05/20/2015, 3:54 PM   NCM spoke to dtr, Ebony Hail, states she lives in home with pt. Offered choice for [facility]. Dtr is requesting [facility] Lemmings for [facility] Contacted [facility] to arrange [facility] for dc today. Contacted [facility] for RW with seat for home. Contacted [facility] to cancel [facility].  Erenest Rasher, RN 05/19/2015, 11:00 AM

## 2015-05-20 NOTE — Care Management Important Message (Signed)
Important Message  Patient Details  Name: Victoria [name] MRN: [id number] Date of Birth: 1932-02-16   Medicare Important Message Given:  Yes-third notification given    Erenest Rasher, RN 05/20/2015, 3:54 PM

## 2015-05-21 NOTE — Progress Notes (Signed)
PT Cancellation Note  Patient Details Name: Victoria [name] MRN: [id number] DOB: 07/31/1932   Cancelled Treatment:     Attempted PT tx session. Daughter requested PT check back later this am.   Weston Anna, MPT Pager: [phone]

## 2015-05-21 NOTE — Telephone Encounter (Signed)
Done. Thx.

## 2015-05-21 NOTE — Care Management Note (Signed)
Case Management Note  Patient Details  Name: Victoria [name] MRN: [id number] Date of Birth: 1932/08/24  Subjective/Objective:                    Action/Plan:d/c SNF   Expected Discharge Date:   (unknown)               Expected Discharge Plan:  Skilled Nursing Facility  In-House Referral:     Discharge planning Services  CM Consult  Post Acute Care Choice:  Home Health (Active w/[facility]) Choice offered to:     DME Arranged:    DME Agency:     HH Arranged:    [place] Agency:     Status of Service:  Completed, signed off  Medicare Important Message Given:  Yes-third notification given Date Medicare IM Given:    Medicare IM give by:    Date Additional Medicare IM Given:    Additional Medicare Important Message give by:     If discussed at [place] of Stay Meetings, dates discussed:    Additional Comments:  Dessa Phi, RN 05/21/2015, 10:43 AM

## 2015-05-21 NOTE — Discharge Summary (Signed)
Physician Discharge Summary  Victoria [name] ZHY:[id number] DOB: [birth year] DOA: 05/15/2015  PCP: Walker Kehr, MD  Admit date: 05/15/2015 Discharge date: 05/21/2015  Recommendations for Outpatient Follow-up:  1. Pt will need to follow up with PCP in 2-3 weeks post discharge 2. 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 Found to be hypoglycemic with possible UTI.   Assessment/Plan:    Multiple myeloma in relapse - per oncologist, recommendation is to hold of on further treatment - outpatient follow up  Acute metabolic encephalopathy - secondary to UTI, progressive failure to thrive and deconditioning, hypoglycemia  - psych consult requested, family concerned for underlying psych issue - worse on 8/20 and d/c held - we have stopped ABX per daughter request, I agree with observation off ABX   UTI - recollected urine culture due to contamination - completed 5 days course, observe for now off ABX  - daughter reports possible intolerance to Cipro and doxycycline   Hypoglycemia - stable in the past 48 hours   Mild hypercalcemia - in the setting of possible UTI and dehydration  - encouraged PO intake   Anemia due to chemotherapy - combination of anemia of chronic disease and related to treatment. - no indication for transfusion at this time   Leukopenia due to antineoplastic chemotherapy - due to recent treatment.  - WBC  stable - pt is asymptomatic   Thrombocytopenia, mild - due to malignancy, recent chemotherapy, dilution, infection, antibiotics - no signs of active bleeding, Plt WNL on 9/21  Conjunctivitis - Topical eye drops were started on 8/17  Malnutrition, severe PCM - in the context of chronic illness - nutritionist consulted  - pt tolerating diet well   Deconditioning - family considering SNF   Code Status: Full.  Family Communication: plan of care discussed with the patient Disposition Plan: SNF  IV access:  Peripheral IV  Procedures and diagnostic studies:   Dg Chest 2 View 05/15/2015 Lungs hypoexpanded. Vascular congestion noted, with minimal bilateral atelectasis.   Medical Consultants:  Oncology  Psych  Other Consultants:  PT   IAnti-Infectives:   Cipro 8/16 --> 8/19 Doxycycline 8/19 --> 8/21       Discharge Exam: Filed Vitals:   05/20/15 2054  BP: 118/51  Pulse: 82  Temp: 98.1 F (36.7 C)  Resp: 18   Filed Vitals:   05/19/15 1900 05/20/15 0415 05/20/15 0608 05/20/15 2054  BP: 135/64  122/53 118/51  Pulse: 69  61 82  Temp: 97.7 F (36.5 C)  97.6 F (36.4 C) 98.1 F (36.7 C)  TempSrc: Axillary  Oral Oral  Resp: $Remo'18  18 18  'yJPUB$ Height:      Weight:  45 kg (99 lb 3.3 oz)    SpO2: 100%  95% 96%    General: Pt is alert, follows commands appropriately, not in acute distress Cardiovascular: Regular rate and rhythm, S1/S2 +, no murmurs, no rubs, no gallops Respiratory: Clear to auscultation bilaterally, no wheezing, no crackles, no rhonchi Abdominal:  Soft, non tender, non distended, bowel sounds +, no guarding  Discharge Instructions      Discharge Instructions    Diet - low sodium heart healthy    Complete by:  As directed      Increase activity slowly    Complete by:  As directed             Medication List    STOP taking these medications        ergocalciferol 50000 UNITS capsule  Commonly known as:  VITAMIN D2      fosfomycin 3 G Pack  Commonly known as:  MONUROL     ixazomib citrate 4 MG capsule  Commonly known as:  NINLARO     lenalidomide 2.5 MG capsule  Commonly known as:  REVLIMID      TAKE these medications        cholecalciferol 1000 UNITS tablet  Commonly known as:  VITAMIN D  Take 2,000 Units by mouth daily.     dexamethasone 4 MG tablet  Commonly known as:  DECADRON  Take 5 tablets (20 mg total) by mouth daily.     LORazepam 1 MG tablet  Commonly known as:  ATIVAN  Take 1 tablet (1 mg total) by mouth every 8 (eight) hours as needed for anxiety.     MULTIVITAMIN & MINERAL PO  Take 1 tablet by mouth daily.     OVER THE COUNTER MEDICATION  Take 2 capsules by mouth daily. Potassium supplement     promethazine 25 MG tablet  Commonly known as:  PHENERGAN  Take 0.5 tablets (12.5 mg total) by mouth every 6 (six) hours as needed for nausea.     QUEtiapine 25 MG tablet  Commonly known as:  SEROQUEL  Take 1 tablet (25 mg total) by mouth 2 (two) times daily.     tobramycin 0.3 % ophthalmic solution  Commonly known as:  TOBREX  Place 1 drop into the left eye every 4 (four) hours.     traMADol 50 MG tablet  Commonly known as:  ULTRAM  Take 1 tablet (50 mg total) by mouth every 6 (six) hours as needed.       Follow-up Information    Call [facility].   Why:  To schedule an appointment with psychiatrist   Contact information:   [address] [place], [place], [place] [address] [phone] - [phone]      Follow up with Walker Kehr, MD.   Specialty:  Internal Medicine   Contact information:   [place] [address] [phone]       Follow up with Faye Ramsay, MD.   Specialty:  Internal Medicine   Contact information:   [address] [place] [address] [phone]       Follow up with [facility].   Specialty:  Home Health Services   Why:  Home Health RN, Physical Therapy and aide   Contact  information:   [place] [address] [phone]        The results of significant diagnostics from this hospitalization (including imaging, microbiology, ancillary and laboratory) are listed below for reference.     Microbiology: Recent Results (from the past 240 hour(s))  Urine culture     Status: None   Collection Time: 05/14/15  6:18 PM  Result Value Ref Range Status   Specimen Description URINE, CLEAN CATCH  Final   Special Requests  NONE  Final   Culture   Final    MULTIPLE SPECIES PRESENT, SUGGEST RECOLLECTION Performed at [facility]    Report Status 05/16/2015 FINAL  Final  Culture, Urine     Status: None   Collection Time: 05/18/15 10:11 AM  Result Value Ref Range Status   Specimen Description URINE, CLEAN CATCH  Final   Special Requests NONE  Final   Culture   Final    MULTIPLE SPECIES PRESENT, SUGGEST RECOLLECTION Performed at [facility]    Report Status 05/19/2015 FINAL  Final     Labs: Basic Metabolic Panel:  Recent Labs Lab 05/15/15 2157 05/16/15 0501 05/17/15 0452 05/18/15 0512 05/20/15 1109  NA 143 141 140 140 140  K 3.6 3.2* 3.9 4.0 3.7  CL 109 108 111 111 111  CO2 21* $Remov'24 25 22 25  'uhseTB$ GLUCOSE 35* 104* 76 80 102*  BUN 29* 28* 22* 19 15  CREATININE 0.85 0.77 0.72 0.73 0.62  CALCIUM 10.9* 10.4* 10.0 10.0 10.4*   Liver Function Tests:  Recent Labs Lab 05/16/15 0501  AST 22  ALT 13*  ALKPHOS 47  BILITOT 0.8  PROT 8.1  ALBUMIN 2.6*   CBC:  Recent Labs Lab 05/14/15 1416 05/15/15 2157 05/16/15 0501 05/17/15 0452 05/18/15 0512 05/20/15 1109  WBC 2.9* 3.4* 2.8* 2.9* 3.4* 3.6*  NEUTROABS 1.8 2.2  --   --   --   --   HGB 10.0* 10.1* 9.1* 8.4* 8.8* 10.0*  HCT 32.9* 33.3* 30.1* 28.0* 29.7* 32.9*  MCV 96.5 97.4 95.6 95.9 97.4 96.2  PLT 143* 154 141* 132* 140* 248   CBG:  Recent Labs Lab 05/17/15 2356 05/18/15 0407 05/18/15 0938 05/18/15 1158 05/18/15 1623  GLUCAP 106* 99 78 127* 162*      SIGNED: Time coordinating discharge: 30 minutes  MAGICK-Harley Fitzwater, MD  [facility] 05/21/2015, 10:31 AM Pager [phone]  If 7PM-7AM, please contact night-coverage [email or web address] Password [email or web address]

## 2015-05-21 NOTE — Care Management Note (Signed)
Case Management Note  Patient Details  Name: DINORAH MASULLO MRN: [id number] Date of Birth: [birth year]  Subjective/Objective:Spoke to dtr in rm about d/c plans. She is aware of d/c order, medicare IM given.She voiced understanding.  She is still looking @ different SNF's-CSW following.  [facility] rep  also following if d/c plan is for home w/HHC.Informed [facility] dme rep Pura Spice of rw-which has already been taken home.                    Action/Plan:d/c plan SNF   Expected Discharge Date:   (unknown)               Expected Discharge Plan:  [place]  In-House Referral:     Discharge planning Services  CM Consult  Post Acute Care Choice:  Home Health (Was active w/[facility], but now dtr wants [facility]) Choice offered to:  Adult Children  DME Arranged:  Walker rolling DME Agency:     HH Arranged:    HH Agency:     Status of Service:  Completed, signed off  Medicare Important Message Given:  Yes-second notification given Date Medicare IM Given:    Medicare IM give by:    Date Additional Medicare IM Given:    Additional Medicare Important Message give by:     If discussed at [place] of Stay Meetings, dates discussed:    Additional Comments:  Dessa Phi, RN 05/21/2015, 12:37 PM

## 2015-05-21 NOTE — Consult Note (Signed)
[facility] Face-to-Face Psychiatry Consult   Reason for Consult:  Psychosis Referring Physician:  Dr. 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 Protein-calorie malnutrition, severe [E43] 05/18/2015  . Hypoglycemia [E16.2] 05/16/2015  . Acute confusional state [F05] 05/14/2015  . Bilateral lower extremity edema [R60.0] 04/27/2015  . Conjunctivitis of left eye [H10.9] 03/30/2015  . Coronary artery calcification [I25.10, I25.84] 03/28/2015  . PVC's (premature ventricular contractions) [I49.3] 03/28/2015  . Weakness [R53.1] 03/28/2015  . Mild dementia [F03.90] 03/07/2015  . Protein calorie malnutrition [E46] 03/02/2015  . Dehydration [E86.0] 02/16/2015  . Diarrhea due to drug [K52.1] 02/16/2015  . Leukopenia due to antineoplastic chemotherapy [D72.819, T45.1X5A] 02/01/2015  . Anemia due to chemotherapy [D64.81, T45.1X5A] 01/24/2015  . Hypokalemia [E87.6] 12/30/2014  . Multiple myeloma in relapse [C90.02] 12/30/2014  . Dementia with behavioral disturbance [F03.91] 10/02/2014  . Hypercalcemia [E83.52] 07/11/2014  . ATAXIA [R27.9] 12/14/2009  . HEARING DEFICIT [H91.90] 05/31/2009  . Anxiety state [F41.1] 03/21/2008  . Anemia [D64.9] 03/08/2008  . INSOMNIA, PERSISTENT [G47.00] 03/08/2008  . 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 Essential hypertension [I10] 11/25/2007    Total Time spent with patient: 1 hour  Subjective:   Victoria [name] is a 81 y.o. female patient admitted with confusion and delusion.  HPI:  Victoria [name] is a 81 y.o. female seen face-to-face with the psychiatric social service and case discussed with patient daughter who walked into the room for psychiatric consultation and evaluation of increased confusion, paranoid delusions and memory deficits. Reportedly  patient has no previous history of acute psychiatric hospitalization but presented with urinary tract infection which caused the confusion and become delusional and paranoid about people are poisoning her. Patient reportedly noncompliant with her diet which leads to hypoglycemia. Patient is slowly getting better with antiemetics treatment but continued to be paranoid delusions and not trusting the staff members. Patient stated staff does not like her for no reason. Patient has been awake, alert, oriented to time place person but not to the situation. Patient has 3 out of 3 and immediate memory but 0 out of 3 on delayed memory. Patient has mild confusion during my evaluation. Patient has a limited understanding about her chronic medical conditions especially multiple myeloma, hypoglycemia and current anxiety state. Patient denies current suicidal/homicidal ideation, intention or plans. Patient is  independent for most of her ADL, and  does manages her medication on her own. Patient daughter stated that she may not be able to stay by herself without any supervision at this time. Patient might have some underlying cognitive deficits associated with her age and then become more confused and psychotic with urinary tract infection. Hoping patient will be cleared when her infection was treated completely.  HPI Elements:   Location:  Paranoid delusions. Quality:  Poor. Severity:  Unable to eat food due to delusional thinking. Timing:  Urinary tract infection. Duration:  3-5 days. Context:  Psychosocial and recurrent medical problems.  Interval history: Patient seen today with psych social service. Patient appeared lying in her bed, calm, and cooperative. Patient appeared less paranoid delusional and able to eat her meal tray without feeling poisoned. Patient daughter is in her room and calling different out of home facilities regarding her disposition plans. She has denied disturbance of sleep and appetite. She  denied auditory and visual hallucinations  and no suicide or homicide ideations. She talks slowly when states that she has trouble urinating this morning.    Past Medical History:  Past Medical History  Diagnosis Date  . Hypertension   . Insomnia   . Anxiety   . Anemia   . Multiple myeloma     In remission  . Diverticulosis 04/2001  . Perforation bowel   . Ringing in ears     Wears hearing aides to drown out   . Rectal bleeding 02/03/2014  . Anemia in chronic illness 01/24/2015    Past Surgical History  Procedure Laterality Date  . Sigmoid resection / rectopexy  01/2011    perforation/stoma  . Colonoscopy    . Partial hysterectomy    . Cesarean section    . Colostomy takedown  06/27/11  . Cholecystectomy  2012    laparoscopic   Family History:  Family History  Problem Relation Age of Onset  . Hypertension Other   . Prostate cancer Father   . Colitis Neg Hx   . Esophageal cancer Neg Hx   . Stomach cancer Neg Hx    Social History:  History  Alcohol Use No     History  Drug Use No    Social History   Social History  . Marital Status: Divorced    Spouse Name: N/A  . Number of Children: 2  . Years of Education: N/A   Social History Main Topics  . Smoking status: Never Smoker   . Smokeless tobacco: Never Used  . Alcohol Use: No  . Drug Use: No  . Sexual Activity: No   Other Topics Concern  . None   Social History Narrative   Divorced.  Lives with daughter.  Normally ambulates without assistance.   Additional Social History:                          Allergies:   Allergies  Allergen Reactions  . Amiodarone     rash  . Amoxicillin Hives and Itching  . Aspirin Other (See Comments)    Child hood allergy   . Atenolol     Hair loss  . Donepezil Other (See Comments)    agitation   . Nortriptyline     agitation  . Latex Dermatitis  . Zoloft [Sertraline Hcl] Anxiety    Increased agitation    Labs:  Results for orders placed or performed  during the hospital encounter of 05/15/15 (from the past 48 hour(s))  CBC     Status: Abnormal   Collection Time: 05/20/15 11:09 AM  Result Value Ref Range   WBC 3.6 (L) 4.0 - 10.5 K/uL   RBC 3.42 (L) 3.87 - 5.11 MIL/uL   Hemoglobin 10.0 (L) 12.0 - 15.0 g/dL   HCT 32.9 (L) 36.0 - 46.0 %   MCV 96.2 78.0 - 100.0 fL   MCH 29.2 26.0 - 34.0 pg   MCHC 30.4 30.0 - 36.0 g/dL   RDW 17.4 (H) 11.5 - 15.5 %   Platelets 248 150 - 400 K/uL  Basic metabolic panel     Status: Abnormal   Collection Time: 05/20/15 11:09 AM  Result Value Ref Range   Sodium 140 135 - 145 mmol/L   Potassium 3.7 3.5 - 5.1 mmol/L   Chloride 111 101 - 111 mmol/L   CO2 25 22 - 32 mmol/L   Glucose, Bld 102 (H) 65 - 99 mg/dL   BUN 15 6 - 20  mg/dL   Creatinine, Ser 0.62 0.44 - 1.00 mg/dL   Calcium 10.4 (H) 8.9 - 10.3 mg/dL   GFR calc non Af Amer >60 >60 mL/min   GFR calc Af Amer >60 >60 mL/min    Comment: (NOTE) The eGFR has been calculated using the CKD EPI equation. This calculation has not been validated in all clinical situations. eGFR's persistently <60 mL/min signify possible Chronic Kidney Disease.    Anion gap 4 (L) 5 - 15    Vitals: Blood pressure 135/53, pulse 62, temperature 98.4 F (36.9 C), temperature source Oral, resp. rate 20, height 5' (1.524 m), weight 45 kg (99 lb 3.3 oz), SpO2 97 %.  Risk to Self: Is patient at risk for suicide?: No Risk to Others:   Prior Inpatient Therapy:   Prior Outpatient Therapy:    Current Facility-Administered Medications  Medication Dose Route Frequency Provider Last Rate Last Dose  . acetaminophen (TYLENOL) tablet 650 mg  650 mg Oral Q6H PRN Lavina Hamman, MD       Or  . acetaminophen (TYLENOL) suppository 650 mg  650 mg Rectal Q6H PRN Lavina Hamman, MD      . enoxaparin (LOVENOX) injection 40 mg  40 mg Subcutaneous Q24H Lavina Hamman, MD   40 mg at 05/21/15 1039  . feeding supplement (BOOST / RESOURCE BREEZE) liquid 1 Container  1 Container Oral BID BM Rosezetta Schlatter, RD   1 Container at 05/21/15 1000  . LORazepam (ATIVAN) tablet 1 mg  1 mg Oral Q8H PRN Lavina Hamman, MD   1 mg at 05/20/15 0120  . ondansetron (ZOFRAN) tablet 4 mg  4 mg Oral Q6H PRN Lavina Hamman, MD       Or  . ondansetron [facility]) injection 4 mg  4 mg Intravenous Q6H PRN Lavina Hamman, MD      . QUEtiapine (SEROQUEL) tablet 25 mg  25 mg Oral BID Ambrose Finland, MD   25 mg at 05/18/15 2200  . sodium chloride 0.9 % injection 3 mL  3 mL Intravenous Q12H Lavina Hamman, MD   3 mL at 05/21/15 1039  . traMADol (ULTRAM) tablet 50 mg  50 mg Oral Q6H PRN Lavina Hamman, MD   50 mg at 05/16/15 2356   Facility-Administered Medications Ordered in Other Encounters  Medication Dose Route Frequency Provider Last Rate Last Dose  . 0.9 %  sodium chloride infusion   Intravenous Once Heath Lark, MD        Musculoskeletal: Strength & Muscle Tone: within normal limits Gait & Station: unable to stand Patient leans: N/A  Psychiatric Specialty Exam: Physical Exam     Blood pressure 135/53, pulse 62, temperature 98.4 F (36.9 C), temperature source Oral, resp. rate 20, height 5' (1.524 m), weight 45 kg (99 lb 3.3 oz), SpO2 97 %.Body mass index is 19.38 kg/(m^2).  General Appearance: Guarded  Eye Contact::  Good  Speech:  Clear and Coherent  Volume:  Decreased  Mood:  Depressed  Affect:  Appropriate and Congruent  Thought Process:  Coherent and Goal Directed  Orientation:  Full (Time, Place, and Person)  Thought Content:  Paranoid Ideation and Rumination  Suicidal Thoughts:  No  Homicidal Thoughts:  No  Memory:  Immediate;   Fair Recent;   Poor  Judgement:  Impaired  Insight:  Shallow  Psychomotor Activity:  Decreased  Concentration:  Poor  Recall:  Poor  Fund of Knowledge:Fair  Language: Good  Akathisia:  Negative  Handed:  Right  AIMS (if indicated):     Assets:  Communication Skills Desire for Improvement Financial Resources/Insurance Housing Leisure  Time Resilience Social Support Transportation  ADL's:  Intact  Cognition: Impaired,  Mild  Sleep:      Medical Decision Making: New Problem, with no additional work-up planned (3), Review of Last Therapy Session (1), Review or order medicine tests (1), Review of Medication Regimen & Side Effects (2) and Review of New Medication or Change in Dosage (2)  Treatment Plan Summary: Patient with increased symptoms of paranoid delusional psychosis and not able to eat because of delusional thinking of food poison, does not trust staff members in the hospital and believes they are working against her. Patient psychosis has been worsening since she got infected with the urinary tract infection  Daily contact with patient to assess and evaluate symptoms and progress in treatment and Medication management  Plan:  Case discussed with the psychiatric social service during the rounds Patient daughter willing to obtain guardianship if needed and helping her regarding out of home placement Monitor for patient diet intake and frequently check paranoid delusions about poisoning Continue Seroquel 25 mg twice daily and has no extrapyramidal symptoms Patient does not meet criteria for psychiatric inpatient admission. Supportive therapy provided about ongoing stressors. Appreciate psychiatric consultation and will sign off at this time Please contact [phone] or [phone] if needs further assistance  Disposition: patient benefit from the out-of-home placement for short-term stay as she cannot take care of herself due to urinary tract infection, memory deficits and paranoid delusions.   Arelene Moroni,JANARDHAHA R. 05/21/2015 2:38 PM

## 2015-05-21 NOTE — Care Management Important Message (Signed)
Important Message  Patient Details  Name: Victoria [name] MRN: [id number] Date of Birth: 07/17/1932   Medicare Important Message Given:  Yes-second notification given    Dessa Phi, RN 05/21/2015, 12:40 PM

## 2015-05-21 NOTE — Telephone Encounter (Signed)
Place [facility] referral specifying [facility].

## 2015-05-21 NOTE — Progress Notes (Signed)
Physical Therapy Treatment Patient Details Name: Victoria [name] MRN: [id number] DOB: 1931/10/19 Today's Date: 05/21/2015    History of Present Illness 81 yo female admitted with hypoglycemia, agitation, fall. Hx of HTN, anxiety, multiple myeloma, anorexia, dementia.     PT Comments    Progressing with mobility. Feel pt would benefit from [place] rehab at [facility].   Follow Up Recommendations  SNF     Equipment Recommendations  None recommended by PT    Recommendations for Other Services       Precautions / Restrictions Precautions Precautions: Fall Restrictions Weight Bearing Restrictions: No    Mobility  Bed Mobility   Bed Mobility: Sit to Supine       Sit to supine: Min assist   General bed mobility comments: assist for LEs onto bed  Transfers Overall transfer level: Needs assistance Equipment used: 4-wheeled walker Transfers: Sit to/from Stand Sit to Stand: Min assist         General transfer comment: assist to rise, stabilize, control descent, lock/unlock brakes. Multimodal cues for safety, technique, hand placement  Ambulation/Gait Ambulation/Gait assistance: Min assist Ambulation Distance (Feet): 75 Feet Assistive device: 4-wheeled walker Gait Pattern/deviations: Step-through pattern;Decreased stride length;Trunk flexed     General Gait Details: Assist to stabilize and maneuver safely with walker.    Stairs            Wheelchair Mobility    Modified Rankin (Stroke Patients Only)       Balance           Standing balance support: Bilateral upper extremity supported;During functional activity Standing balance-Leahy Scale: Poor Standing balance comment: needs RW                    Cognition Arousal/Alertness: Awake/alert Behavior During Therapy: WFL for tasks assessed/performed Overall Cognitive Status: History of cognitive impairments - at baseline                      Exercises      General Comments         Pertinent Vitals/Pain Pain Assessment: No/denies pain    Home Living                      Prior Function            PT Goals (current goals can now be found in the care plan section) Progress towards PT goals: Progressing toward goals    Frequency  Min 3X/week    PT Plan Current plan remains appropriate    Co-evaluation             End of Session Equipment Utilized During Treatment: Gait belt Activity Tolerance: Patient limited by fatigue Patient left: in bed;with call bell/phone within reach;with family/visitor present     Time: 1216-1231 PT Time Calculation (min) (ACUTE ONLY): 15 min  Charges:  $Gait Training: 8-22 mins                    G Codes:      Weston Anna, MPT Pager: [phone]

## 2015-05-21 NOTE — Progress Notes (Signed)
Report called to [employer]. Hospital course reviewed and discharge reviewed. Daughter/son to transport to facility. Callie Fielding RN

## 2015-05-21 NOTE — Clinical Social Work Placement (Signed)
Patient is set to discharge to [facility] today. Patient & daughter, Victoria [name] at bedside aware. Discharge packet given to RN, Robert Bellow. Daughter to transport to SNF.     Raynaldo Opitz, [facility] Clinical Social Worker cell #: [phone]    CLINICAL SOCIAL WORK PLACEMENT  NOTE  Date:  05/21/2015  Patient Details  Name: Victoria [name] MRN: [id number] Date of Birth: 02-21-1932  Clinical Social Work is seeking post-discharge placement for this patient at the [place] level of care (*CSW will initial, date and re-position this form in  chart as items are completed):  Yes   Patient/family provided with [place] Work Department's list of facilities offering this level of care within the geographic area requested by the patient (or if unable, by the patient's family).  Yes   Patient/family informed of their freedom to choose among providers that offer the needed level of care, that participate in Medicare, Medicaid or managed care program needed by the patient, have an available bed and are willing to accept the patient.  Yes   Patient/family informed of [facility]'s ownership interest in [facility] and [facility], as well as of the fact that they are under no obligation to receive care at these facilities.  PASRR submitted to EDS on 05/21/15     PASRR number received on 05/21/15     Existing PASRR number confirmed on       FL2 transmitted to all facilities in geographic area requested by pt/family on 05/21/15     FL2 transmitted to all facilities within larger geographic area on       Patient informed that his/her managed care company has contracts with or will negotiate with certain facilities, including the following:        Yes   Patient/family informed of bed offers received.  Patient chooses bed at [facility]     Physician recommends and patient chooses bed at      Patient to be  transferred to [facility] on 05/21/15.  Patient to be transferred to facility by patient's daughter's car     Patient family notified on 05/21/15 of transfer.  Name of family member notified:  patient's daughter, Victoria [name] at bedside     PHYSICIAN       Additional Comment:    _______________________________________________ Standley Brooking, LCSW 05/21/2015, 4:34 PM

## 2015-05-21 NOTE — Progress Notes (Signed)
Clinical Social Work  CSW met with patient at bedside with psych MD. Patient eating lunch and reports she is feeling better with no paranoid thoughts. Patient reports a good appetite and sleeping well. Patient's dtr at bedside and working with unit CSW on SNF placement. Psych MD to monitor medications. CSW will continue to follow.  [place], [place] [phone]

## 2015-05-22 ENCOUNTER — Telehealth: Payer: Self-pay | Admitting: *Deleted

## 2015-05-22 ENCOUNTER — Telehealth: Payer: Self-pay | Admitting: Internal Medicine

## 2015-05-22 NOTE — Telephone Encounter (Signed)
Victoria [name] called for patient regarding she has been in hospital and she is now home bound and they are in need of documentation for Medicare for home health services Please call her at [phone]

## 2015-05-22 NOTE — Telephone Encounter (Signed)
Called pt/daughter Ebony Hail) concerning setting up TCM appt which is set up for 05/29/15. The daughter states mom is having more anxiety issues. She has been rx lorazepam 1 mg and she take 1 at bedtime, but its not helping at all. Requesting md recommendations...Johny Chess

## 2015-05-22 NOTE — Telephone Encounter (Signed)
Called allison back no answer LMOM with md response...Victoria [name]

## 2015-05-22 NOTE — Telephone Encounter (Signed)
Transition Care Management Follow-up Telephone Call   Date discharged? 05/21/15   How have you been since you were released from the hospital? Spoke with daughter Ebony Hail) she states mom is doing ok but having more anxiety. Was given Lorazepam but not helping. Inform daughter will send md a msg to get recommendation on anxiety   Do you understand why you were in the hospital? YES   Do you understand the discharge instructions? YES   Where were you discharged to? Home   Items Reviewed:  Medications reviewed: YES  Allergies reviewed: YES  Dietary changes reviewed: YES  Referrals reviewed: No referral needed   Functional Questionnaire:   Activities of Daily Living (ADLs):   Daughter states mom are independent in the following: feeding and toileting States they require assistance with the following: ambulation, bathing and hygiene, continence, grooming and dressing family has sent msg to md wanting home health services   Any transportation issues/concerns?: NO  Any patient concerns? YES, mainly the anxiety issues   Confirmed importance and date/time of follow-up visits scheduled YES, made for 05/29/15  Provider Appointment booked with Dr. Alain Marion  Confirmed with patient if condition begins to worsen call PCP or go to the ER.  Patient was given the office number and encouraged to call back with question or concerns.  : YES

## 2015-05-22 NOTE — Telephone Encounter (Signed)
Use Lorazepam up to tid pls Thx

## 2015-05-25 ENCOUNTER — Ambulatory Visit (INDEPENDENT_AMBULATORY_CARE_PROVIDER_SITE_OTHER): Payer: Medicare Other | Admitting: Podiatry

## 2015-05-25 ENCOUNTER — Encounter: Payer: Self-pay | Admitting: Podiatry

## 2015-05-25 VITALS — BP 112/45 | HR 79 | Resp 14

## 2015-05-25 DIAGNOSIS — B351 Tinea unguium: Secondary | ICD-10-CM

## 2015-05-25 DIAGNOSIS — M79676 Pain in unspecified toe(s): Secondary | ICD-10-CM

## 2015-05-27 NOTE — Progress Notes (Signed)
Patient ID: Victoria [name], female   DOB: May 10, 1932, 81 y.o.   MRN: [id number] Complaint:  Visit Type: Patient returns to my office for continued preventative foot care services. Complaint: Patient states" my nails have grown long and thick and become painful to walk and wear shoes" . The patient presents for preventative foot care services. No changes to ROS  Podiatric Exam: Vascular: dorsalis pedis and posterior tibial pulses are palpable bilateral. Capillary return is immediate. Temperature gradient is WNL. Skin turgor WNL  Sensorium: Normal Semmes Weinstein monofilament test. Normal tactile sensation bilaterally. Nail Exam: Pt has thick disfigured discolored nails with subungual debris noted bilateral entire nail hallux through fifth toenails Ulcer Exam: There is no evidence of ulcer or pre-ulcerative changes or infection. Orthopedic Exam: Muscle tone and strength are WNL. No limitations in general ROM. No crepitus or effusions noted. Foot type and digits show no abnormalities. Bony prominences are unremarkable. Skin: No Porokeratosis. No infection or ulcers  Diagnosis:  Onychomycosis, , Pain in right toe, pain in left toes  Treatment & Plan Procedures and Treatment: Consent by patient was obtained for treatment procedures. The patient understood the discussion of treatment and procedures well. All questions were answered thoroughly reviewed. Debridement of mycotic and hypertrophic toenails, 1 through 5 bilateral and clearing of subungual debris. No ulceration, no infection noted.  Return Visit-Office Procedure: Patient instructed to return to the office for a follow up visit 3 months for continued evaluation and treatment.

## 2015-05-29 ENCOUNTER — Other Ambulatory Visit: Payer: Self-pay

## 2015-05-29 ENCOUNTER — Encounter (HOSPITAL_COMMUNITY): Payer: Self-pay

## 2015-05-29 ENCOUNTER — Other Ambulatory Visit: Payer: Medicare Other

## 2015-05-29 ENCOUNTER — Other Ambulatory Visit (HOSPITAL_BASED_OUTPATIENT_CLINIC_OR_DEPARTMENT_OTHER): Payer: Medicare Other

## 2015-05-29 ENCOUNTER — Observation Stay (HOSPITAL_COMMUNITY)
Admission: AD | Admit: 2015-05-29 | Discharge: 2015-05-31 | Disposition: A | Discharge status: Skilled Nursing Facility | Payer: Medicare Other | Source: Ambulatory Visit | Attending: Internal Medicine | Admitting: Internal Medicine

## 2015-05-29 ENCOUNTER — Encounter: Payer: Self-pay | Admitting: Hematology and Oncology

## 2015-05-29 ENCOUNTER — Inpatient Hospital Stay: Payer: Medicare Other | Admitting: Internal Medicine

## 2015-05-29 ENCOUNTER — Ambulatory Visit (HOSPITAL_BASED_OUTPATIENT_CLINIC_OR_DEPARTMENT_OTHER): Payer: Medicare Other | Admitting: Hematology and Oncology

## 2015-05-29 VITALS — BP 120/45 | HR 65 | Temp 97.7°F | Resp 17 | Ht 60.0 in | Wt 101.9 lb

## 2015-05-29 DIAGNOSIS — E46 Unspecified protein-calorie malnutrition: Secondary | ICD-10-CM | POA: Diagnosis not present

## 2015-05-29 DIAGNOSIS — F0391 Unspecified dementia with behavioral disturbance: Secondary | ICD-10-CM | POA: Diagnosis not present

## 2015-05-29 DIAGNOSIS — C9002 Multiple myeloma in relapse: Secondary | ICD-10-CM | POA: Diagnosis not present

## 2015-05-29 DIAGNOSIS — F419 Anxiety disorder, unspecified: Secondary | ICD-10-CM | POA: Diagnosis not present

## 2015-05-29 DIAGNOSIS — D630 Anemia in neoplastic disease: Principal | ICD-10-CM | POA: Insufficient documentation

## 2015-05-29 DIAGNOSIS — F03918 Unspecified dementia, unspecified severity, with other behavioral disturbance: Secondary | ICD-10-CM | POA: Diagnosis present

## 2015-05-29 DIAGNOSIS — I10 Essential (primary) hypertension: Secondary | ICD-10-CM | POA: Diagnosis not present

## 2015-05-29 DIAGNOSIS — Z681 Body mass index (BMI) 19 or less, adult: Secondary | ICD-10-CM | POA: Diagnosis not present

## 2015-05-29 DIAGNOSIS — Z515 Encounter for palliative care: Secondary | ICD-10-CM

## 2015-05-29 DIAGNOSIS — Z79899 Other long term (current) drug therapy: Secondary | ICD-10-CM | POA: Diagnosis not present

## 2015-05-29 DIAGNOSIS — D892 Hypergammaglobulinemia, unspecified: Secondary | ICD-10-CM | POA: Diagnosis not present

## 2015-05-29 DIAGNOSIS — D72819 Decreased white blood cell count, unspecified: Secondary | ICD-10-CM | POA: Insufficient documentation

## 2015-05-29 DIAGNOSIS — D649 Anemia, unspecified: Secondary | ICD-10-CM | POA: Diagnosis not present

## 2015-05-29 DIAGNOSIS — R600 Localized edema: Secondary | ICD-10-CM | POA: Diagnosis present

## 2015-05-29 DIAGNOSIS — R531 Weakness: Secondary | ICD-10-CM

## 2015-05-29 DIAGNOSIS — Z7189 Other specified counseling: Secondary | ICD-10-CM

## 2015-05-29 LAB — BASIC METABOLIC PANEL
Anion gap: 4 — ABNORMAL LOW (ref 5–15)
BUN: 21 mg/dL — AB (ref 6–20)
CALCIUM: 8.9 mg/dL (ref 8.9–10.3)
CHLORIDE: 110 mmol/L (ref 101–111)
CO2: 27 mmol/L (ref 22–32)
CREATININE: 0.68 mg/dL (ref 0.44–1.00)
Glucose, Bld: 102 mg/dL — ABNORMAL HIGH (ref 65–99)
Potassium: 3.4 mmol/L — ABNORMAL LOW (ref 3.5–5.1)
SODIUM: 141 mmol/L (ref 135–145)

## 2015-05-29 LAB — COMPREHENSIVE METABOLIC PANEL (CC13)
ALT: 30 U/L (ref 0–55)
AST: 25 U/L (ref 5–34)
Albumin: 2.6 g/dL — ABNORMAL LOW (ref 3.5–5.0)
Alkaline Phosphatase: 74 U/L (ref 40–150)
Anion Gap: 8 mEq/L (ref 3–11)
BUN: 19.2 mg/dL (ref 7.0–26.0)
CO2: 26 mEq/L (ref 22–29)
Calcium: 10.9 mg/dL — ABNORMAL HIGH (ref 8.4–10.4)
Chloride: 108 mEq/L (ref 98–109)
Creatinine: 0.8 mg/dL (ref 0.6–1.1)
EGFR: 86 mL/min/{1.73_m2} — ABNORMAL LOW (ref >90)
Glucose: 78 mg/dl (ref 70–140)
Potassium: 3.7 mEq/L (ref 3.5–5.1)
Sodium: 142 mEq/L (ref 136–145)
Total Bilirubin: 0.68 mg/dL (ref 0.20–1.20)
Total Protein: 9.5 g/dL — ABNORMAL HIGH (ref 6.4–8.3)

## 2015-05-29 LAB — CBC & DIFF AND RETIC
BASO%: 0.2 % (ref 0.0–2.0)
BASOS ABS: 0 10*3/uL (ref 0.0–0.1)
EOS%: 0.2 % (ref 0.0–7.0)
Eosinophils Absolute: 0 10*3/uL (ref 0.0–0.5)
HEMATOCRIT: 32.5 % — AB (ref 34.8–46.6)
HGB: 10.1 g/dL — ABNORMAL LOW (ref 11.6–15.9)
Immature Retic Fract: 12.3 % — ABNORMAL HIGH (ref 1.60–10.00)
LYMPH#: 0.6 10*3/uL — AB (ref 0.9–3.3)
LYMPH%: 14 % (ref 14.0–49.7)
MCH: 30 pg (ref 25.1–34.0)
MCHC: 31.1 g/dL — AB (ref 31.5–36.0)
MCV: 96.4 fL (ref 79.5–101.0)
MONO#: 0.3 10*3/uL (ref 0.1–0.9)
MONO%: 7.6 % (ref 0.0–14.0)
NEUT%: 78 % — ABNORMAL HIGH (ref 38.4–76.8)
NEUTROS ABS: 3.5 10*3/uL (ref 1.5–6.5)
Platelets: 278 10*3/uL (ref 145–400)
RBC: 3.37 10*6/uL — AB (ref 3.70–5.45)
RDW: 17.4 % — AB (ref 11.2–14.5)
RETIC %: 1.77 % (ref 0.70–2.10)
RETIC CT ABS: 59.65 10*3/uL (ref 33.70–90.70)
WBC: 4.5 10*3/uL (ref 3.9–10.3)

## 2015-05-29 LAB — PHOSPHORUS: PHOSPHORUS: 2.7 mg/dL (ref 2.5–4.6)

## 2015-05-29 LAB — MRSA PCR SCREENING: MRSA BY PCR: NEGATIVE

## 2015-05-29 LAB — MAGNESIUM: MAGNESIUM: 1.5 mg/dL — AB (ref 1.7–2.4)

## 2015-05-29 MED ORDER — PROMETHAZINE HCL 25 MG PO TABS: 12.5000 mg | ORAL_TABLET | Freq: Four times a day (QID) | ORAL | Status: DC | PRN

## 2015-05-29 MED ORDER — TRAZODONE HCL 50 MG PO TABS: 50.0000 mg | ORAL_TABLET | Freq: Every evening | ORAL | Status: DC | PRN

## 2015-05-29 MED ORDER — SODIUM CHLORIDE 0.9 % IV SOLN
INTRAVENOUS | Status: DC
  Administered 2015-05-29: 19:00:00 via INTRAVENOUS

## 2015-05-29 MED ORDER — ACETAMINOPHEN 650 MG RE SUPP: 650.0000 mg | Freq: Four times a day (QID) | RECTAL | Status: DC | PRN

## 2015-05-29 MED ORDER — SODIUM CHLORIDE 0.9 % IV SOLN
90.0000 mg | Freq: Once | INTRAVENOUS | Status: AC
  Administered 2015-05-29: 90 mg via INTRAVENOUS
  Filled 2015-05-29: qty 10

## 2015-05-29 MED ORDER — SODIUM CHLORIDE 0.9 % IJ SOLN: 3.0000 mL | Freq: Two times a day (BID) | INTRAMUSCULAR | Status: DC

## 2015-05-29 MED ORDER — ACETAMINOPHEN 325 MG PO TABS: 650.0000 mg | ORAL_TABLET | Freq: Four times a day (QID) | ORAL | Status: DC | PRN

## 2015-05-29 MED ORDER — LORAZEPAM 1 MG PO TABS
1.0000 mg | ORAL_TABLET | Freq: Three times a day (TID) | ORAL | Status: DC | PRN
  Administered 2015-05-30: 1 mg via ORAL
  Filled 2015-05-29: qty 1

## 2015-05-29 MED ORDER — HEPARIN SODIUM (PORCINE) 5000 UNIT/ML IJ SOLN
5000.0000 [IU] | Freq: Three times a day (TID) | INTRAMUSCULAR | Status: DC
Start: 2015-05-29 — End: 2015-05-31
  Administered 2015-05-29 – 2015-05-31 (×6): 5000 [IU] via SUBCUTANEOUS
  Filled 2015-05-29 (×5): qty 1

## 2015-05-29 MED ORDER — MEMANTINE HCL 5 MG PO TABS
5.0000 mg | ORAL_TABLET | Freq: Every day | ORAL | Status: DC
  Administered 2015-05-30: 5 mg via ORAL
  Filled 2015-05-29: qty 1

## 2015-05-29 MED ORDER — DEXAMETHASONE 4 MG PO TABS
5.0000 mg | ORAL_TABLET | Freq: Every day | ORAL | Status: DC
  Administered 2015-05-30 – 2015-05-31 (×2): 5 mg via ORAL
  Filled 2015-05-29 (×2): qty 2

## 2015-05-29 MED ORDER — ONDANSETRON HCL 4 MG/2ML IJ SOLN: 4.0000 mg | Freq: Four times a day (QID) | INTRAMUSCULAR | Status: DC | PRN

## 2015-05-29 MED ORDER — TRAMADOL HCL 50 MG PO TABS: 50.0000 mg | ORAL_TABLET | Freq: Four times a day (QID) | ORAL | Status: DC | PRN

## 2015-05-29 MED ORDER — ONDANSETRON HCL 4 MG PO TABS: 4.0000 mg | ORAL_TABLET | Freq: Four times a day (QID) | ORAL | Status: DC | PRN

## 2015-05-29 MED ORDER — TOBRAMYCIN 0.3 % OP SOLN
1.0000 [drp] | OPHTHALMIC | Status: DC
  Administered 2015-05-29 – 2015-05-30 (×6): 1 [drp] via OPHTHALMIC
  Filled 2015-05-29: qty 5

## 2015-05-29 MED ORDER — SODIUM CHLORIDE 0.9 % IV SOLN
2.0000 g | Freq: Once | INTRAVENOUS | Status: AC
  Administered 2015-05-29: 2 g via INTRAVENOUS
  Filled 2015-05-29: qty 20

## 2015-05-29 NOTE — Progress Notes (Signed)
[place] OFFICE PROGRESS NOTE  Patient Care Team: Cassandria Anger, MD as PCP - General [place] Dragon, MD as Consulting Physician (Gastroenterology) Leonie Man, MD as Consulting Physician (Cardiology) Heath Lark, MD as Consulting Physician (Hematology and Oncology)  SUMMARY OF ONCOLOGIC HISTORY:   Multiple myeloma in relapse   12/30/2014 Initial Diagnosis Multiple myeloma in relapse   02/05/2015 Bone Marrow Biopsy BM biopsy was non-diagnostic. FISH was positive for loss of 17p and gain of chromosome 11   02/06/2015 -  Chemotherapy She was started on Ninlaro, dexamethasone and Revlimid.   02/16/2015 Adverse Reaction Revlimid was placed on hold due to uncontrolled diarrhea.   She was initially diagnosed in October, 2008. Initial treatment with oral Alkeran, prednisone, and Revlimid. Revlimid stopped early on due to a dysphoric reaction. She was treated for 6 months. She reached a stable plateau and had a long treatment free interval. Last cycle given in October 2009. Secondary to rising paraprotein levels, treatment was reinitiated with oral pomalidomide in February 2014. Initial dose 2 mg 21 days on 7 day rest. She tolerated this well and was escalated to 3 mg. There was a slow but steady improvement in her paraprotein levels. Due to various reasons, pomalidomide was held since 09/02/2013.  In May 2016, she have disease relapse with hypercalcemia. On 02/05/2015, bone marrow biopsy was performed which show relapse of multiple myeloma. From 05/15/2015 to 05/21/2015, she was admitted to the hospital due to dehydration, altered mental status and urinary tract infection. She was subsequently discharged to skilled nursing facility INTERVAL HISTORY: Please see below for problem oriented charting. She returns for further follow-up after recent hospitalization. The patient appears somewhat sleepy. According to her daughter, she is doing well. She was able to do one hour of physical  therapy today. She denies pain. No recent infection.  REVIEW OF SYSTEMS:   Constitutional: Denies fevers, chills or abnormal weight loss Eyes: Denies blurriness of vision Ears, nose, mouth, throat, and face: Denies mucositis or sore throat Respiratory: Denies cough, dyspnea or wheezes Cardiovascular: Denies palpitation, chest discomfort or lower extremity swelling Gastrointestinal:  Denies nausea, heartburn or change in bowel habits Skin: Denies abnormal skin rashes Lymphatics: Denies new lymphadenopathy or easy bruising Neurological:Denies numbness, tingling or new weaknesses Behavioral/Psych: Mood is stable, no new changes  All other systems were reviewed with the patient and are negative.  I have reviewed the past medical history, past surgical history, social history and family history with the patient and they are unchanged from previous note.  ALLERGIES:  is allergic to amiodarone; amoxicillin; aspirin; atenolol; donepezil; nortriptyline; latex; and zoloft.  MEDICATIONS:  No current facility-administered medications for this visit.   No current outpatient prescriptions on file.   Facility-Administered Medications Ordered in Other Visits  Medication Dose Route Frequency Provider Last Rate Last Dose  . 0.9 %  sodium chloride infusion   Intravenous Once Heath Lark, MD        PHYSICAL EXAMINATION: ECOG PERFORMANCE STATUS: 2 - Symptomatic, <50% confined to bed  Filed Vitals:   05/29/15 1346  BP: 120/45  Pulse: 65  Temp: 97.7 F (36.5 C)  Resp: 17   Filed Weights   05/29/15 1346  Weight: 101 lb 14.4 oz (46.222 kg)    GENERAL:alert, no distress and comfortable. She looks thin and cachectic SKIN: skin color, texture, turgor are normal, no rashes or significant lesions EYES: normal, Conjunctiva are pink and non-injected, sclera clear OROPHARYNX:no exudate, no erythema and lips, buccal mucosa,  and tongue normal  NECK: supple, thyroid normal size, non-tender, without  nodularity LYMPH:  no palpable lymphadenopathy in the cervical, axillary or inguinal LUNGS: clear to auscultation and percussion with normal breathing effort HEART: regular rate & rhythm and no murmurs and no lower extremity edema ABDOMEN:abdomen soft, non-tender and normal bowel sounds Musculoskeletal:no cyanosis of digits and no clubbing  NEURO: alert & oriented x 3 with fluent speech, no focal motor/sensory deficits. She appears somewhat sedated  LABORATORY DATA:  I have reviewed the data as listed    Component Value Date/Time   NA 142 05/29/2015 1321   NA 140 05/20/2015 1109   K 3.7 05/29/2015 1321   K 3.7 05/20/2015 1109   CL 111 05/20/2015 1109   CL 103 03/11/2013 1503   CO2 26 05/29/2015 1321   CO2 25 05/20/2015 1109   GLUCOSE 78 05/29/2015 1321   GLUCOSE 102* 05/20/2015 1109   GLUCOSE 91 03/11/2013 1503   BUN 19.2 05/29/2015 1321   BUN 15 05/20/2015 1109   CREATININE 0.8 05/29/2015 1321   CREATININE 0.62 05/20/2015 1109   CALCIUM 10.9* 05/29/2015 1321   CALCIUM 10.4* 05/20/2015 1109   CALCIUM 11.4* 12/30/2014 0835   PROT 9.5* 05/29/2015 1321   PROT 8.1 05/16/2015 0501   ALBUMIN 2.6* 05/29/2015 1321   ALBUMIN 2.6* 05/16/2015 0501   AST 25 05/29/2015 1321   AST 22 05/16/2015 0501   ALT 30 05/29/2015 1321   ALT 13* 05/16/2015 0501   ALKPHOS 74 05/29/2015 1321   ALKPHOS 47 05/16/2015 0501   BILITOT 0.68 05/29/2015 1321   BILITOT 0.8 05/16/2015 0501   GFRNONAA >60 05/20/2015 1109   GFRAA >60 05/20/2015 1109    No results found for: SPEP, UPEP  Lab Results  Component Value Date   WBC 4.5 05/29/2015   NEUTROABS 3.5 05/29/2015   HGB 10.1* 05/29/2015   HCT 32.5* 05/29/2015   MCV 96.4 05/29/2015   PLT 278 05/29/2015      Chemistry      Component Value Date/Time   NA 142 05/29/2015 1321   NA 140 05/20/2015 1109   K 3.7 05/29/2015 1321   K 3.7 05/20/2015 1109   CL 111 05/20/2015 1109   CL 103 03/11/2013 1503   CO2 26 05/29/2015 1321   CO2 25 05/20/2015  1109   BUN 19.2 05/29/2015 1321   BUN 15 05/20/2015 1109   CREATININE 0.8 05/29/2015 1321   CREATININE 0.62 05/20/2015 1109      Component Value Date/Time   CALCIUM 10.9* 05/29/2015 1321   CALCIUM 10.4* 05/20/2015 1109   CALCIUM 11.4* 12/30/2014 0835   ALKPHOS 74 05/29/2015 1321   ALKPHOS 47 05/16/2015 0501   AST 25 05/29/2015 1321   AST 22 05/16/2015 0501   ALT 30 05/29/2015 1321   ALT 13* 05/16/2015 0501   BILITOT 0.68 05/29/2015 1321   BILITOT 0.8 05/16/2015 0501    ASSESSMENT & PLAN:  Multiple myeloma in relapse The patient has very poor tolerance to treatment and developed multiple complications. Since her treatment was placed on hold, she have recurrence of hypercalcemia. She also has significant paraproteinemia from blood work today which I suspect would be related to a major relapse. I have a long discussion with her daughter as the patient is not able to make informed decision. We discussed the risks treatment options or palliative care. Her daughter would like her to be placed on immunotherapy but this will not be approved to be given as an inpatient. As the  patient was still in a skilled nursing facility, giving her the treatment there is not possible. I recommend we have further discussion pending further medical progress after she is hospitalized for malignant hypercalcemia  Hypercalcemia She has significant hypercalcemia of malignancy. I recommend aggressive IV fluid hydration and IV Zometa. The patient is currently placed on telemetric bed fall monitoring. I will discuss treatment options further with her daughter after she is in the hospital  Anemia in neoplastic disease This is likely anemia of chronic disease. The patient denies recent history of bleeding such as epistaxis, hematuria or hematochezia. She is asymptomatic from the anemia. We will observe for now.  She does not require transfusion now.     No orders of the defined types were placed in this  encounter.   All questions were answered. The patient knows to call the clinic with any problems, questions or concerns. No barriers to learning was detected. I spent 25 minutes counseling the patient face to face. The total time spent in the appointment was 40 minutes and more than 50% was on counseling and review of test results     [facility], [place], MD 05/29/2015 2:53 PM

## 2015-05-29 NOTE — H&P (Signed)
[facility] History and Physical  [name] RDE:[id number] DOB: 08/18/1932 DOA: 05/29/2015  Referring physician: Dr. Alvy Bimler - Oncology outpt PCP: Heath Lark, MD   Chief Complaint: hypercalcemia  HPI: Victoria [name] is a 81 y.o. female  Patient states that since discharge from the hospital on 05/21/2015 she had been in her normal state of health. States that she went for evaluation and lab work at her outpatient oncology office and was called with subsequent result showing hypercalcemia was told to come to [facility] hospital. For admission. Since discharge patient has been a nursing home patient at [facility]. Patient states that the only adverse event she's had since her discharge was when she was given a dose of Seroquel at night for which she felt very out of it for an extended period of time anfd has not had any subsequent doses of medicine. Condition is constant. Nothing makes the condition better or worse. Nothing is being given to reverse the condition.  Review of Systems:  Constitutional:  No weight loss, night sweats, Fevers, chills HEENT:  No headaches, Difficulty swallowing,Tooth/dental problems,Sore throat,  No sneezing, itching, ear ache, nasal congestion, post nasal drip,  Cardio-vascular:  No chest pain, Orthopnea, PND, anasarca, dizziness, palpitations  GI:  No heartburn, indigestion, abdominal pain, nausea, vomiting, diarrhea, change in bowel habits, Resp:   No shortness of breath with exertion or at rest. No excess mucus, no productive cough, No non-productive cough, No coughing up of blood.No change in color of mucus.No wheezing.No chest wall deformity  Skin:  no rash or lesions.  GU:  no dysuria, change in color of urine, no urgency or frequency. No flank pain.  Musculoskeletal:   No joint pain or swelling. No decreased range of motion. No back pain.  Psych:  No change in mood or affect. No depression or anxiety. No memory loss.   Past Medical History    Diagnosis Date  . Hypertension   . Insomnia   . Anxiety   . Anemia   . Multiple myeloma     In remission  . Diverticulosis 04/2001  . Perforation bowel   . Ringing in ears     Wears hearing aides to drown out   . Rectal bleeding 02/03/2014  . Anemia in chronic illness 01/24/2015   Past Surgical History  Procedure Laterality Date  . Sigmoid resection / rectopexy  01/2011    perforation/stoma  . Colonoscopy    . Partial hysterectomy    . Cesarean section    . Colostomy takedown  06/27/11  . Cholecystectomy  2012    laparoscopic   Social History:  reports that she has never smoked. She has never used smokeless tobacco. She reports that she does not drink alcohol or use illicit drugs.  Allergies  Allergen Reactions  . Amoxicillin Hives and Itching  . Aspirin Other (See Comments)    Child hood allergy   . Atenolol     Hair loss  . Donepezil Other (See Comments)    agitation   . Nortriptyline     agitation  . Amiodarone Rash  . Latex Dermatitis  . Zoloft [Sertraline Hcl] Anxiety    Increased agitation    Family History  Problem Relation Age of Onset  . Hypertension Other   . Prostate cancer Father   . Colitis Neg Hx   . Esophageal cancer Neg Hx   . Stomach cancer Neg Hx      Prior to Admission medications   Medication Sig Start Date  End Date Taking? Authorizing Provider  cholecalciferol (VITAMIN D) 1000 UNITS tablet Take 2,000 Units by mouth daily.    Historical Provider, MD  LORazepam (ATIVAN) 1 MG tablet Take 1 tablet (1 mg total) by mouth every 8 (eight) hours as needed for anxiety. 05/19/15   Theodis Blaze, MD  memantine (NAMENDA) 5 MG tablet Take 5 mg by mouth 2 (two) times daily.    Historical Provider, MD  [place] 3 G PACK  05/16/15   Historical Provider, MD  Multiple Vitamins-Minerals (MULTIVITAMIN & MINERAL PO) Take 1 tablet by mouth daily.    Historical Provider, MD  NINLARO 4 MG capsule  05/24/15   Historical Provider, MD  OVER THE COUNTER MEDICATION Take 2  capsules by mouth daily. Potassium supplement    Historical Provider, MD  promethazine (PHENERGAN) 25 MG tablet Take 0.5 tablets (12.5 mg total) by mouth every 6 (six) hours as needed for nausea. 02/05/15   Heath Lark, MD  REVLIMID 2.5 MG capsule  05/11/15   Historical Provider, MD  traMADol (ULTRAM) 50 MG tablet Take 1 tablet (50 mg total) by mouth every 6 (six) hours as needed. 05/19/15   Theodis Blaze, MD   Physical Exam: Filed Vitals:   05/29/15 1513 05/29/15 1600  BP: 124/46   Pulse: 72   Temp: 98.3 F (36.8 C)   TempSrc: Oral   Resp: 16   Height:  5' (1.524 m)  Weight:  46.222 kg (101 lb 14.4 oz)  SpO2: 95%     Wt Readings from Last 3 Encounters:  05/29/15 46.222 kg (101 lb 14.4 oz)  05/29/15 46.222 kg (101 lb 14.4 oz)  05/20/15 45 kg (99 lb 3.3 oz)    General:  frail-appearing.  Appears calm Eyes:  PERRL, normal lids, irises & conjunctiva ENT:  dry mucous membranes  Neck:  no LAD, masses or thyromegaly Cardiovascular:  RRR,  2/6 systolic murmur. Trace lower extremity edema.  Respiratory:  CTA bilaterally, no w/r/r. Normal respiratory effort. Abdomen:  soft, ntnd Skin:  no rash or induration seen on limited exam Musculoskeletal:  grossly normal tone BUE/BLE Psychiatric:  grossly normal mood and affect, speech fluent and appropriate Neurologic:  grossly non-focal.          Labs on Admission:  Basic Metabolic Panel:  Recent Labs Lab 05/29/15 1321  NA 142  K 3.7  CO2 26  GLUCOSE 78  BUN 19.2  CREATININE 0.8  CALCIUM 10.9*   Liver Function Tests:  Recent Labs Lab 05/29/15 1321  AST 25  ALT 30  ALKPHOS 74  BILITOT 0.68  PROT 9.5*  ALBUMIN 2.6*   No results for input(s): LIPASE, AMYLASE in the last 168 hours. No results for input(s): AMMONIA in the last 168 hours. CBC:  Recent Labs Lab 05/29/15 1321  WBC 4.5  NEUTROABS 3.5  HGB 10.1*  HCT 32.5*  MCV 96.4  PLT 278   Cardiac Enzymes: No results for input(s): CKTOTAL, CKMB, CKMBINDEX, TROPONINI  in the last 168 hours.  BNP (last 3 results) No results for input(s): BNP in the last 8760 hours.  ProBNP (last 3 results) No results for input(s): PROBNP in the last 8760 hours.  CBG: No results for input(s): GLUCAP in the last 168 hours.  Radiological Exams on Admission: No results found.  Assessment/Plan Active Problems:   Anemia   Hypercalcemia   Dementia with behavioral disturbance   Multiple myeloma in relapse   Bilateral lower extremity edema    Hypercalcemia: 10.9 on admission,  however though corrected value due to low albumin shows calcium of ~11.75. Likely secondary to multiple myeloma and ongoing treatment for her condition.  - admit to telemetry - IVF - pamidronate x1 - Ca gluconate 2gm x1 - BMET Q8 - Tele - EKG - Mag, PHos  Multiple Myeloma: Relapsed. Ongoing treatment by oncology and aware of pt at [facility] as they requested admission (Dr Alvy Bimler). Pt apparently has been on Decadron $RemoveBef'10mg'spKyhQaMno$  PO since discharge (8 days) from the hospital per NH report. Unsure if this is part of her actual cancer regimen as family states that this was supposed to only be weekly.  - f/u Oncology recs. - Decreased Decadron to $RemoveBef'5mg'MyzPOHlceZ$  Qday and would ask oncology for further dosing recommendations.  - Palliative care consult  Dementia: at baseline.  - continue home Namenda - Trazodone QHS prn  ANxiety: - continue home ativan  Anemia: Hgb 10.1.  At baseline.  - CBC in am   Code Status: FULL DVT Prophylaxis: Hep Family Communication: Daughter  Disposition Plan: Pending improvement  MERRELL, DAVID Lenna Sciara, MD Family Medicine [facility] [email or web address] Password [email or web address]

## 2015-05-29 NOTE — Assessment & Plan Note (Signed)
The patient has very poor tolerance to treatment and developed multiple complications. Since her treatment was placed on hold, she have recurrence of hypercalcemia. She also has significant paraproteinemia from blood work today which I suspect would be related to a major relapse. I have a long discussion with her daughter as the patient is not able to make informed decision. We discussed the risks treatment options or palliative care. Her daughter would like her to be placed on immunotherapy but this will not be approved to be given as an inpatient. As the patient was still in a skilled nursing facility, giving her the treatment there is not possible. I recommend we have further discussion pending further medical progress after she is hospitalized for malignant hypercalcemia

## 2015-05-29 NOTE — Assessment & Plan Note (Signed)
This is likely anemia of chronic disease. The patient denies recent history of bleeding such as epistaxis, hematuria or hematochezia. She is asymptomatic from the anemia. We will observe for now.  She does not require transfusion now.   

## 2015-05-29 NOTE — Assessment & Plan Note (Signed)
She has significant hypercalcemia of malignancy. I recommend aggressive IV fluid hydration and IV Zometa. The patient is currently placed on telemetric bed fall monitoring. I will discuss treatment options further with her daughter after she is in the hospital

## 2015-05-30 DIAGNOSIS — D630 Anemia in neoplastic disease: Secondary | ICD-10-CM | POA: Diagnosis not present

## 2015-05-30 DIAGNOSIS — R531 Weakness: Secondary | ICD-10-CM

## 2015-05-30 DIAGNOSIS — C9002 Multiple myeloma in relapse: Secondary | ICD-10-CM

## 2015-05-30 DIAGNOSIS — Z515 Encounter for palliative care: Secondary | ICD-10-CM | POA: Diagnosis not present

## 2015-05-30 DIAGNOSIS — Z7189 Other specified counseling: Secondary | ICD-10-CM

## 2015-05-30 DIAGNOSIS — D649 Anemia, unspecified: Secondary | ICD-10-CM | POA: Diagnosis not present

## 2015-05-30 DIAGNOSIS — I10 Essential (primary) hypertension: Secondary | ICD-10-CM

## 2015-05-30 DIAGNOSIS — F0391 Unspecified dementia with behavioral disturbance: Secondary | ICD-10-CM | POA: Diagnosis not present

## 2015-05-30 DIAGNOSIS — D72819 Decreased white blood cell count, unspecified: Secondary | ICD-10-CM

## 2015-05-30 DIAGNOSIS — E46 Unspecified protein-calorie malnutrition: Secondary | ICD-10-CM

## 2015-05-30 DIAGNOSIS — R600 Localized edema: Secondary | ICD-10-CM | POA: Diagnosis not present

## 2015-05-30 DIAGNOSIS — D892 Hypergammaglobulinemia, unspecified: Secondary | ICD-10-CM

## 2015-05-30 LAB — URINALYSIS, ROUTINE W REFLEX MICROSCOPIC
Bilirubin Urine: NEGATIVE
GLUCOSE, UA: NEGATIVE mg/dL
KETONES UR: NEGATIVE mg/dL
LEUKOCYTES UA: NEGATIVE
Nitrite: NEGATIVE
PH: 5.5 (ref 5.0–8.0)
Protein, ur: 30 mg/dL — AB
Specific Gravity, Urine: 1.019 (ref 1.005–1.030)
Urobilinogen, UA: 0.2 mg/dL (ref 0.0–1.0)

## 2015-05-30 LAB — CBC
HCT: 26.2 % — ABNORMAL LOW (ref 36.0–46.0)
Hemoglobin: 8.1 g/dL — ABNORMAL LOW (ref 12.0–15.0)
MCH: 30.1 pg (ref 26.0–34.0)
MCHC: 30.9 g/dL (ref 30.0–36.0)
MCV: 97.4 fL (ref 78.0–100.0)
PLATELETS: 208 10*3/uL (ref 150–400)
RBC: 2.69 MIL/uL — AB (ref 3.87–5.11)
RDW: 17.6 % — ABNORMAL HIGH (ref 11.5–15.5)
WBC: 3.2 10*3/uL — ABNORMAL LOW (ref 4.0–10.5)

## 2015-05-30 LAB — COMPREHENSIVE METABOLIC PANEL
ALBUMIN: 2.2 g/dL — AB (ref 3.5–5.0)
ALK PHOS: 55 U/L (ref 38–126)
ALT: 23 U/L (ref 14–54)
ANION GAP: 5 (ref 5–15)
AST: 26 U/L (ref 15–41)
BUN: 19 mg/dL (ref 6–20)
CALCIUM: 8.9 mg/dL (ref 8.9–10.3)
CO2: 25 mmol/L (ref 22–32)
CREATININE: 0.58 mg/dL (ref 0.44–1.00)
Chloride: 110 mmol/L (ref 101–111)
GFR calc Af Amer: >60 mL/min (ref >60)
GFR calc non Af Amer: >60 mL/min (ref >60)
GLUCOSE: 64 mg/dL — AB (ref 65–99)
Potassium: 3.4 mmol/L — ABNORMAL LOW (ref 3.5–5.1)
SODIUM: 140 mmol/L (ref 135–145)
Total Bilirubin: 0.7 mg/dL (ref 0.3–1.2)
Total Protein: 7.1 g/dL (ref 6.5–8.1)

## 2015-05-30 LAB — MAGNESIUM: Magnesium: 1.4 mg/dL — ABNORMAL LOW (ref 1.7–2.4)

## 2015-05-30 LAB — URINE MICROSCOPIC-ADD ON

## 2015-05-30 MED ORDER — ZOLEDRONIC ACID 4 MG/5ML IV CONC
3.3000 mg | Freq: Once | INTRAVENOUS | Status: DC
  Filled 2015-05-30: qty 4.13

## 2015-05-30 NOTE — Evaluation (Signed)
Physical Therapy Evaluation Patient Details Name: Victoria [name] MRN: [id number] DOB: Mar 24, 1932 Today's Date: 05/30/2015   History of Present Illness  81 year old female presented from the [facility] for dehydration, hypercalcemia. Pt recently discharged from the hospital on 05/21/2015 to skilled nursing facility she had been in her normal state of health. Patient reported that she went for evaluation and lab work at her outpatient oncology office and was called with subsequent result showing hypercalcemia was told to come to [facility] hospital.  Hx of HTN, anxiety, multiple myeloma, anorexia, dementia.   Clinical Impression  Pt admitted with above diagnosis. Pt currently with functional limitations due to the deficits listed below (see PT Problem List).  Pt will benefit from skilled PT to increase their independence and safety with mobility to allow discharge to the venue listed below.  Pt presents with confusion today.  Pt assist OOB, attempted a few steps however pt stating she couldn't walk because she "needed to count the numbers" so assisted to recliner.  Recommend return to SNF for rehab upon d/c.     Follow Up Recommendations SNF (return to SNF for rehab)    Equipment Recommendations  None recommended by PT    Recommendations for Other Services       Precautions / Restrictions Precautions Precautions: Fall      Mobility  Bed Mobility Overal bed mobility: Needs Assistance Bed Mobility: Supine to Sit     Supine to sit: Min assist;HOB elevated     General bed mobility comments: verbal cues for technique due to confusion, assist for trunk upright  Transfers Overall transfer level: Needs assistance Equipment used: Rolling walker (2 wheeled) Transfers: Sit to/from Stand Sit to Stand: Min assist         General transfer comment: assist to rise, stabilize, control descent. Multimodal cues provided due to confusion for safety, technique, hand  placement  Ambulation/Gait Ambulation/Gait assistance: Min assist Ambulation Distance (Feet): 4 Feet Assistive device: Rolling walker (2 wheeled) Gait Pattern/deviations: Step-to pattern;Narrow base of support;Trunk flexed     General Gait Details: assist to steady, started taking a few steps then pt reported she needed to "count the numbers" (was looking at IV pump) and couldn't walk but was able to take a few steps backwards to recliner with step by step cues and continued to require min assist for steadying, deferred further gait for safety  Stairs            Wheelchair Mobility    Modified Rankin (Stroke Patients Only)       Balance                                             Pertinent Vitals/Pain Pain Assessment: Faces Faces Pain Scale: Hurts little more Pain Location: IV site Pain Descriptors / Indicators: Sore Pain Intervention(s): Limited activity within patient's tolerance;Monitored during session;Repositioned (RN in room end of session and aware)    Home Living Family/patient expects to be discharged to:: Skilled nursing facility Living Arrangements: Children         Entrance Stairs-Number of Steps: [place]: Walker - 2 wheels;Cane - single point;Bedside commode      Prior Function Level of Independence: Needs assistance   Gait / Transfers Assistance Needed: daughter reports pt was doing well with PT at SNF, ambulatory with RW  Hand Dominance        Extremity/Trunk Assessment   Upper Extremity Assessment: Generalized weakness           Lower Extremity Assessment: Generalized weakness      Cervical / Trunk Assessment: Kyphotic  Communication   Communication: No difficulties  Cognition Arousal/Alertness: Awake/alert Behavior During Therapy: WFL for tasks assessed/performed Overall Cognitive Status: Impaired/Different from baseline (hx demenita) Area of Impairment: Orientation;Following  commands;Safety/judgement;Memory Orientation Level: Disoriented to;Place;Time;Situation   Memory: Decreased short-term memory Following Commands: Follows one step commands with increased time Safety/Judgement: Decreased awareness of deficits;Decreased awareness of safety     General Comments: daughter reports pt more confused today then baseline    General Comments      Exercises        Assessment/Plan    PT Assessment Patient needs continued PT services  PT Diagnosis Difficulty walking;Generalized weakness;Altered mental status   PT Problem List Decreased cognition;Decreased strength;Decreased activity tolerance;Decreased balance;Decreased mobility;Decreased knowledge of use of DME;Decreased safety awareness  PT Treatment Interventions DME instruction;Gait training;Functional mobility training;Therapeutic activities;Patient/family education;Balance training;Therapeutic exercise   PT Goals (Current goals can be found in the Care Plan section) Acute Rehab PT Goals PT Goal Formulation: With patient/family Time For Goal Achievement: 06/06/15 Potential to Achieve Goals: Fair    Frequency Min 3X/week   Barriers to discharge        Co-evaluation               End of Session Equipment Utilized During Treatment: Gait belt Activity Tolerance: Patient limited by fatigue;Other (comment) (limited by cognition) Patient left: in chair;with call bell/phone within reach;with bed alarm set;with family/visitor present;with nursing/sitter in room Nurse Communication: Mobility status    Functional Assessment Tool Used: clinical judgement Functional Limitation: Mobility: Walking and moving around Mobility: Walking and Moving Around Current Status (Q4920): At least 20 percent but less than 40 percent impaired, limited or restricted Mobility: Walking and Moving Around Goal Status [phone]): At least 1 percent but less than 20 percent impaired, limited or restricted    Time:  1319-1336 PT Time Calculation (min) (ACUTE ONLY): 17 min   Charges:   PT Evaluation $Initial PT Evaluation Tier I: 1 Procedure     PT G Codes:   PT G-Codes **NOT FOR INPATIENT CLASS** Functional Assessment Tool Used: clinical judgement Functional Limitation: Mobility: Walking and moving around Mobility: Walking and Moving Around Current Status (Q1975): At least 20 percent but less than 40 percent impaired, limited or restricted Mobility: Walking and Moving Around Goal Status [phone]): At least 1 percent but less than 20 percent impaired, limited or restricted    Zakari Bathe,KATHrine E 05/30/2015, 2:57 PM Carmelia Bake, PT, DPT 05/30/2015 Pager: [phone]

## 2015-05-30 NOTE — Care Management Note (Signed)
Case Management Note  Patient Details  Name: Victoria [name] MRN: [id number] Date of Birth: 01/14/1932  Subjective/Objective:   81 y/o f admitted w/malignant hypercalcemia.GP:CWTPELGK Myeloma. From SNF.Readmit-AMS, UTI.                 Action/Plan:d/c plan SNF   Expected Discharge Date:   (unknown)               Expected Discharge Plan:  [place]  In-House Referral:     Discharge planning Services  CM Consult  Post Acute Care Choice:    Choice offered to:     DME Arranged:    DME Agency:     HH Arranged:    HH Agency:     Status of Service:  In process, will continue to follow  Medicare Important Message Given:    Date Medicare IM Given:    Medicare IM give by:    Date Additional Medicare IM Given:    Additional Medicare Important Message give by:     If discussed at [place] of Stay Meetings, dates discussed:    Additional Comments:  Dessa Phi, RN 05/30/2015, 10:31 AM

## 2015-05-30 NOTE — Progress Notes (Signed)
[facility]  Telephone:([phone]) [phone]   Patient Care Team: Artis Delay, MD as PCP - General (Hematology and Oncology) Hart Carwin, MD as Consulting Physician (Gastroenterology) Marykay Lex, MD as Consulting Physician (Cardiology) Artis Delay, MD as Consulting Physician (Hematology and Oncology)  HOSPITAL CONSULT  NOTE I have seen the patient, examined her and edited the notes as follows  HPI: Victoria [name] is a 81 year old woman with a history of relapsed multiple myeloma. She was admitted for the management of malignant hypercalcemia. On admission, her corrected Calcium levels were 11.75, requiring IV hydration and Pamidronate. She tolerated treatment well.  Denies fevers, chills, night sweats, vision changes, or mucositis. Denies any respiratory complaints. Denies any chest pain or palpitations. Denies lower extremity swelling. Denies nausea, heartburn or change in bowel habits. Last bowel movement on 8/30 . Denies abdominal pain. Appetite is normal. Denies any dysuria. Denies abnormal skin rashes, or neuropathy. Denies any bleeding issues such as epistaxis, hematemesis, hematuria or hematochezia.     Multiple myeloma in relapse   12/30/2014 Initial Diagnosis Multiple myeloma in relapse   02/05/2015 Bone Marrow Biopsy BM biopsy was non-diagnostic. FISH was positive for loss of 17p and gain of chromosome 11   02/06/2015 -  Chemotherapy She was started on Ninlaro, dexamethasone and Revlimid.   02/16/2015 Adverse Reaction Revlimid was placed on hold due to uncontrolled diarrhea.   05/15/15-05/21/15 Hospital she was admitted to the hospital due to dehydration, altered mental status and urinary tract infection. Discharged to skilled nursing facility   05/29/15 Hospital She was admitted for the management of hypercalcemia with IV Pamidronate anf IV Hydration   From 05/15/2015 to 05/21/2015,  She was subsequently discharged to skilled nursing facility  MEDICATIONS: Scheduled Meds: .  dexamethasone  5 mg Oral Daily  . heparin  5,000 Units Subcutaneous 3 times per day  . memantine  5 mg Oral Q1200  . sodium chloride  3 mL Intravenous Q12H  . tobramycin  1 drop Left Eye 6 times per day   Continuous Infusions: . sodium chloride 100 mL/hr at 05/29/15 1900   PRN Meds:.acetaminophen **OR** acetaminophen, LORazepam, ondansetron **OR** ondansetron (ZOFRAN) IV, promethazine, traMADol, traZODone   ALLERGIES:   Allergies  Allergen Reactions  . Amoxicillin Hives and Itching  . Aspirin Other (See Comments)    Child hood allergy   . Atenolol     Hair loss  . Donepezil Other (See Comments)    agitation   . Nortriptyline     agitation  . Amiodarone Rash  . Latex Dermatitis  . Zoloft [Sertraline Hcl] Anxiety    Increased agitation     PHYSICAL EXAMINATION:  Filed Vitals:   05/30/15 0423  BP: 133/55  Pulse: 54  Temp: 98.1 F (36.7 C)  Resp: 18   Filed Weights   05/29/15 1600  Weight: 101 lb 14.4 oz (46.222 kg)    GENERAL:alert, no distress and comfortable SKIN: skin color, texture, turgor are normal, no rashes or significant lesions EYES: normal, conjunctiva are pink and non-injected, sclera clear OROPHARYNX: no exudate, no erythema and lips, buccal mucosa, and tongue normal  NECK: supple, thyroid normal size, non-tender, without nodularity LYMPH:  no palpable lymphadenopathy in the cervical, axillary or inguinal LUNGS: clear to auscultation and percussion with normal breathing effort HEART: regular rate & rhythm and no murmurs and no lower extremity edema ABDOMEN: soft, non-tender and normal bowel sounds Musculoskeletal:no cyanosis of digits and no clubbing. Less tender at the left tibial  area  PSYCH: alert , conversant, mild confusion die to known dementia NEURO: no focal motor/sensory deficits   LABORATORY/RADIOLOGY DATA:   Recent Labs Lab 05/29/15 1321 05/30/15 0559  WBC 4.5 3.2*  HGB 10.1* 8.1*  HCT 32.5* 26.2*  PLT 278 208  MCV 96.4 97.4    MCH 30.0 30.1  MCHC 31.1* 30.9  RDW 17.4* 17.6*  LYMPHSABS 0.6*  --   MONOABS 0.3  --   EOSABS 0.0  --   BASOSABS 0.0  --     CMP    Recent Labs Lab 05/29/15 1321 05/29/15 2210 05/30/15 0559  NA 142 141 PENDING  K 3.7 3.4* PENDING  CL  --  110 PENDING  CO2 26 27 PENDING  GLUCOSE 78 102* 64*  BUN 19.2 21* 19  CREATININE 0.8 0.68 0.58  CALCIUM 10.9* 8.9 PENDING  MG  --  1.5* 1.4*  AST 25  --  26  ALT 30  --  23  ALKPHOS 74  --  55  BILITOT 0.68  --  0.7        Component Value Date/Time   BILITOT 0.7 05/30/2015 0559   BILITOT 0.68 05/29/2015 1321   BILIDIR 0.2 10/02/2014 1654   IBILI 0.8 02/22/2011 0208    Anemia panel:    Recent Labs  05/29/15 1321  RETICCTPCT 1.77    Liver Function Tests:  Recent Labs Lab 05/29/15 1321 05/30/15 0559  AST 25 26  ALT 30 23  ALKPHOS 74 55  BILITOT 0.68 0.7  PROT 9.5* 7.1  ALBUMIN 2.6* 2.2*   Radiology Studies:  None   ASSESSMENT AND PLAN:  Multiple myeloma in relapse The patient has very poor tolerance to treatment and developed multiple complications. Since her treatment was placed on hold, she have recurrence of hypercalcemia.  She also has significant paraproteinemia on her latest blood work, suspect would be related to a major relapse. I dad a long discussion with her daughter yesterday as the patient is not able to make informed decision. We discussed the risks treatment options or palliative care. Her daughter would like her to be placed on immunotherapy but this will not be approved to be given as an inpatient. As the patient was still in a skilled nursing facility, giving her the treatment there is not possible. I recommend she remain on low-dose dexamethasone upon discharge. The daughter will call me after she gets discharged from skilled nursing facility for further treatment as an outpatient  Hypercalcemia She has significant hypercalcemia of malignancy. Her Calcium levels on admission were  10.9, corrected at 11.75 She was admitted for the management of hypercalcemia on 8/30, receiving aggressive IV fluid hydration and IV pamidronate.  She was placed on telemetric bed fall monitoring. Calcium level has improved  I recommend she remains on low-dose dexamethasone upon discharge  Anemia in neoplastic disease This is likely anemia of chronic disease and aggressive dilution  The patient denies recent history of bleeding such as epistaxis, hematuria or hematochezia.  She is asymptomatic from the anemia.  We will observe for now.  She does not require transfusion now, unless Hb drops to 7.0.   Leukopenia In the setting of aggressive dilution No intervention is indicated at this time Continue to closely monitor  Malnutrition Consider Nutrition evaluation Appreciate Nutrition follow up  Full Code Other medical issues as per admitting team  I will sign off. The daughter will call me within the next 2 weeks for outpatient appointment Please call if questions arise  Rondel Jumbo, PA-C 05/30/2015, 6:58 AM Victoria Kunin, MD 05/30/2015

## 2015-05-30 NOTE — Care Management Note (Signed)
Case Management Note  Patient Details  Name: ANNETT BOXWELL MRN: [id number] Date of Birth: 08/14/1932  Subjective/Objective: Copy of signed [id number] given to dtr, who just wanted to take a picture, copy put in shadow chart. CSW following for SNF.                 Action/Plan:d/c plan SNF.   Expected Discharge Date:   (unknown)               Expected Discharge Plan:  [place]  In-House Referral:     Discharge planning Services  CM Consult  Post Acute Care Choice:    Choice offered to:     DME Arranged:    DME Agency:     HH Arranged:    HH Agency:     Status of Service:  In process, will continue to follow  Medicare Important Message Given:    Date Medicare IM Given:    Medicare IM give by:    Date Additional Medicare IM Given:    Additional Medicare Important Message give by:     If discussed at [place] of Stay Meetings, dates discussed:    Additional Comments:  Dessa Phi, RN 05/30/2015, 12:13 PM

## 2015-05-30 NOTE — Progress Notes (Signed)
[facility]                                                                              Patient Demographics  Victoria [name], is a 81 y.o. female, DOB - 1931/12/23, HKV:[id number]  Admit date - 05/29/2015   Admitting Physician Waldemar Dickens, MD  Outpatient Primary MD for the patient is [facility], NI, MD  LOS - 1   No chief complaint on file.      Brief HPI   Patient is 81 year old female with hypertension, multiple myeloma, chronic anemia, dementia presented from the [facility] for dehydration, hypercalcemia. Patient states that since discharge from the hospital on 05/21/2015 to skilled nursing facility she had been in her normal state of health. Patient reported that she went for evaluation and lab work at her outpatient oncology office and was called with subsequent result showing hypercalcemia was told to come to [facility] hospital. Since discharge patient has been a nursing home patient at [facility]. Patient states that the only adverse event she's had since her discharge was when she was given a dose of Seroquel at night for which she felt very out of it for an extended period of time anfd has not had any subsequent doses of medicine. Patient was admitted for the treatment of hypercalcemia. Her has oncologist also recommended for palliative medicine consult   Assessment & Plan    Principal Problem:   Hypercalcemia - Much improved today, corrected calcium 10.3, albumin 2.2 - Patient received one dose of pamidronate with IV fluids. It appears that patient also received a dose of calcium gluconate overnight (?). will follow calcium closely, continue IV fluids. - Per oncology, Dr. Alvy Bimler to recommendations, continue Decadron at discharge  Active Problems:   Anemia - Likely due to underlying Multiple myeloma - No recent history of epistaxis, hemoptysis or GI bleeding. Continue to observe, transfuse if hemoglobin less than 7    Essential hypertension -  Currently stable    Dementia with behavioral disturbance - Close to baseline mental status, daughter at the bedside    Multiple myeloma in relapse, leukopenia - Oncology following, patient has had poor tolerance to treatment with multiple complications. Since her treatment was placed on hold, she has recurrence of hypercalcemia, significant paraproteinemia likely due to major relapse. Oncology also recommending palliative care consult, placed  Generalized weakness - Follow PT OT evaluation   Code Status: Full code  Family Communication: Discussed in detail with the patient, all imaging results, lab results explained to the patient and daughter at the bedside   Disposition Plan: DC to skilled nursing therapy tomorrow if calcium remains stable   Time Spent in minutes  25 minutes   Procedures  None  Consults   Oncology,Dr Alvy Bimler  DVT Prophylaxis  heparin   Medications  Scheduled Meds: . dexamethasone  5 mg Oral Daily  . heparin  5,000 Units Subcutaneous 3 times per day  . memantine  5 mg Oral Q1200  . sodium chloride  3 mL Intravenous Q12H  . tobramycin  1 drop Left Eye 6 times per day   Continuous Infusions: . sodium chloride 100 mL/hr  at 05/29/15 1900   PRN Meds:.acetaminophen **OR** acetaminophen, LORazepam, ondansetron **OR** ondansetron (ZOFRAN) IV, promethazine, traMADol, traZODone   Antibiotics   Anti-infectives    None        Subjective:   Megyn Drennon was seen and examined today.  Patient denies dizziness, chest pain, shortness of breath, abdominal pain, N/V/D/C, new weakness, numbess, tingling. No acute events overnight.    Objective:   Blood pressure 133/55, pulse 54, temperature 98.1 F (36.7 C), temperature source Oral, resp. rate 18, height 5' (1.524 m), weight 46.222 kg (101 lb 14.4 oz), SpO2 98 %.  Wt Readings from Last 3 Encounters:  05/29/15 46.222 kg (101 lb 14.4 oz)  05/29/15 46.222 kg (101 lb 14.4 oz)  05/20/15 45 kg (99 lb 3.3 oz)       Intake/Output Summary (Last 24 hours) at 05/30/15 1159 Last data filed at 05/30/15 0800  Gross per 24 hour  Intake   1560 ml  Output    700 ml  Net    860 ml    Exam  General: Alert and oriented x self, NAD, frail  HEENT:  PERRLA, EOMI, Anicteric Sclera, mucous membranes moist.   Neck: Supple, no JVD, no masses  CVS: S1 S2 auscultated, no rubs, murmurs or gallops. Regular rate and rhythm.  Respiratory: Clear to auscultation bilaterally, no wheezing, rales or rhonchi  Abdomen: Soft, nontender, nondistended, + bowel sounds  Ext: no cyanosis clubbing or edema  Neuro: no new deficits   Skin: No rashes  Psych: alert and oriented xself   Data Review   Micro Results Recent Results (from the past 240 hour(s))  MRSA PCR Screening     Status: None   Collection Time: 05/29/15  4:47 PM  Result Value Ref Range Status   MRSA by PCR NEGATIVE NEGATIVE Final    Comment:        The GeneXpert MRSA Assay (FDA approved for NASAL specimens only), is one component of a comprehensive MRSA colonization surveillance program. It is not intended to diagnose MRSA infection nor to guide or monitor treatment for MRSA infections.     Radiology Reports Dg Chest 2 View  05/15/2015   CLINICAL DATA:  Acute onset of altered mental status and weakness. Initial encounter.  EXAM: CHEST  2 VIEW  COMPARISON:  Chest radiograph performed 06/20/2011  FINDINGS: The lungs are hypoexpanded. Vascular crowding and vascular congestion are seen, with minimal bilateral atelectasis. There is no evidence of pleural effusion or pneumothorax.  The heart is normal in size; the mediastinal contour is within normal limits. No acute osseous abnormalities are seen.  IMPRESSION: Lungs hypoexpanded. Vascular congestion noted, with minimal bilateral atelectasis.   Electronically Signed   By: Garald Balding M.D.   On: 05/15/2015 22:35   Ct Head Wo Contrast  05/14/2015   CLINICAL DATA:  Fall last Tuesday.   Intracranial hemorrhage.  EXAM: CT HEAD WITHOUT CONTRAST  TECHNIQUE: Contiguous axial images were obtained from the base of the skull through the vertex without intravenous contrast.  COMPARISON:  05/08/2015.  FINDINGS: Multiple lytic lesions are present throughout the calvarium compatible with history of multiple myeloma. No mass lesion, mass effect, midline shift, hydrocephalus, hemorrhage. No acute territorial cortical ischemia/infarct. Atrophy and chronic ischemic white matter disease is present. Soft tissue swelling is present inferior to the occiput which is a chronic finding in this patient, some of which is probably due to partial volume averaging. Intracranial atherosclerosis. The visible paranasal sinuses are within normal limits.  IMPRESSION: No  acute intracranial abnormality. Atrophy and chronic ischemic white matter disease.   Electronically Signed   By: Dereck Ligas M.D.   On: 05/14/2015 16:25   Ct Head Wo Contrast  05/08/2015   CLINICAL DATA:  Pain following fall.  Known multiple myeloma.  EXAM: CT HEAD WITHOUT CONTRAST  TECHNIQUE: Contiguous axial images were obtained from the base of the skull through the vertex without intravenous contrast.  COMPARISON:  Head CT and brain MRI December 30, 2014  FINDINGS: Mild diffuse atrophy is stable. There is no intracranial mass, hemorrhage, extra-axial fluid collection, or midline shift. Small vessel disease in the centra semiovale bilaterally appear stable. No new gray-white compartment lesions. No acute infarct apparent.  Multiple lytic lesions consistent with multiple myeloma are noted throughout the calvarium, stable. A lytic lesion is noted in the right clivus region, stable. Mastoid air cells bilaterally are clear.  IMPRESSION: Multiple lytic bone lesions consistent with multiple myeloma, stable. Atrophy with supratentorial small vessel disease is stable. No intracranial mass, hemorrhage, or extra-axial fluid collection. No demonstrable acute infarct.    Electronically Signed   By: Lowella Grip III M.D.   On: 05/08/2015 22:57   Dg Shoulder Left  05/08/2015   CLINICAL DATA:  Left shoulder pain, fall in bathroom  EXAM: LEFT SHOULDER - 2+ VIEW  COMPARISON:  None.  FINDINGS: Two views of the left shoulder submitted. No acute fracture or subluxation. Degenerative changes AC joint. Inferior spurring of acromion. Minimal degenerative changes glenohumeral joint.  IMPRESSION: No acute fracture or subluxation. Degenerative changes as described above.   Electronically Signed   By: Lahoma Crocker M.D.   On: 05/08/2015 21:45   Dg Hip Unilat With Pelvis 2-3 Views Left  05/08/2015   CLINICAL DATA:  Status post fall in bathroom, with left hip pain. Initial encounter.  EXAM: DG HIP (WITH OR WITHOUT PELVIS) 2-3V LEFT  COMPARISON:  CT of the abdomen and pelvis performed 02/22/2011, and abdominal radiograph performed 02/23/2011  FINDINGS: There is mild cortical irregularity involving the left superior pubic ramus, suspicious for an acute left superior pubic ramus fracture. A left inferior pubic ramus fracture may also be present.  Both femoral heads are seated normally within their respective acetabula. The proximal left femur appears intact. No significant degenerative change is appreciated. The sacroiliac joints are unremarkable in appearance. A large lytic lesion at the right iliac wing reflects the patient's known multiple myeloma, more prominent than in 2012. Tiny lucencies within the visualized osseous structures likely also reflect the patient's multiple myeloma.  The visualized bowel gas pattern is grossly unremarkable in appearance. Scattered phleboliths are noted within the pelvis.  IMPRESSION: 1. Mild cortical irregularity involving the left superior pubic ramus, suspicious for an acute left superior pubic ramus fracture. Left inferior pubic ramus fracture is also suspected. 2. Increasing size of large lytic lesion at the right iliac wing, reflecting the patient's known  multiple myeloma. Scattered tiny lucencies within the visualized osseous structures likely also reflect multiple myeloma.   Electronically Signed   By: Garald Balding M.D.   On: 05/08/2015 21:52    CBC  Recent Labs Lab 05/29/15 1321 05/30/15 0559  WBC 4.5 3.2*  HGB 10.1* 8.1*  HCT 32.5* 26.2*  PLT 278 208  MCV 96.4 97.4  MCH 30.0 30.1  MCHC 31.1* 30.9  RDW 17.4* 17.6*  LYMPHSABS 0.6*  --   MONOABS 0.3  --   EOSABS 0.0  --   BASOSABS 0.0  --  Chemistries   Recent Labs Lab 05/29/15 1321 05/29/15 2210 05/30/15 0559  NA 142 141 140  K 3.7 3.4* 3.4*  CL  --  110 110  CO2 $Re'26 27 25  'vBd$ GLUCOSE 78 102* 64*  BUN 19.2 21* 19  CREATININE 0.8 0.68 0.58  CALCIUM 10.9* 8.9 8.9  MG  --  1.5* 1.4*  AST 25  --  26  ALT 30  --  23  ALKPHOS 74  --  55  BILITOT 0.68  --  0.7   ------------------------------------------------------------------------------------------------------------------ estimated creatinine clearance is 39.6 mL/min (by C-G formula based on Cr of 0.58). ------------------------------------------------------------------------------------------------------------------ No results for input(s): HGBA1C in the last 72 hours. ------------------------------------------------------------------------------------------------------------------ No results for input(s): CHOL, HDL, LDLCALC, TRIG, CHOLHDL, LDLDIRECT in the last 72 hours. ------------------------------------------------------------------------------------------------------------------ No results for input(s): TSH, T4TOTAL, T3FREE, THYROIDAB in the last 72 hours.  Invalid input(s): FREET3 ------------------------------------------------------------------------------------------------------------------  Recent Labs  05/29/15 1321  RETICCTPCT 1.77    Coagulation profile No results for input(s): INR, PROTIME in the last 168 hours.  No results for input(s): DDIMER in the last 72 hours.  Cardiac Enzymes No  results for input(s): CKMB, TROPONINI, MYOGLOBIN in the last 168 hours.  Invalid input(s): CK ------------------------------------------------------------------------------------------------------------------ Invalid input(s): POCBNP  No results for input(s): GLUCAP in the last 72 hours.   Sekou Zuckerman M.D. [facility] 05/30/2015, 11:59 AM  Pager: [phone] Between 7am to 7pm - call Pager - [phone]  After 7pm go to [email or web address] - password [email or web address]  Call night coverage person covering after 7pm

## 2015-05-30 NOTE — Consult Note (Signed)
Consultation Note Date: 05/30/2015   Patient Name: Victoria [name]  DOB: [birth year]  MRN: [id number]  Age / Sex: 81 y.o., female   PCP: Heath Lark, MD Referring Physician: Mendel Corning, MD  Reason for Consultation: Establishing goals of care  Palliative Care Assessment and Plan Summary of Established Goals of Care and Medical Treatment Preferences    Palliative Care Discussion Held Today:    This NP Wadie Lessen reviewed medical records, received report from team, assessed the patient and then meet at the patient's bedside along with her daughter/ Shaune Spittle to discuss diagnosis, prognosis, GOC, EOL wishes, disposition and options.  A detailed discussion was had today regarding advanced directives.  Concepts specific to code status, artifical feeding and hydration, continued IV antibiotics and rehospitalization was had.  The difference between a aggressive medical intervention path  and a palliative comfort care path for this patient at this time was had.  Values and goals of care important to patient and family were attempted to be elicited.  Concept of Hospice and Palliative Care were discussed.  Concept of failure to thrive was discussed  Questions and concerns addressed.  Family encouraged to call with questions or concerns.  PMT will continue to support holistically.   Primary Decision Maker: Coralee Pesa Dols/documetned HPOA   Goals of Care/Code Status/Advance Care Planning:  Code status: Family is encouraged to consider DNR/DNI status knowing limitations of this  intervention in similar patients.    Also strongly encouraged Victoria [name] to continue today's conversation with her brother and make adjustments and changes to advanced directive decisions depending on patient's medical situation and viable treatment options; always considering the intention of intervention and limitations of medicine.   Family is open to all offered and available medical interventions to prolong  life.  Symptom Management:    Weakness: PT/OT and SNF for rehabilitation on discharge  Psycho-social/Spiritual:    Support System: family, daughter is main support person, son lives out of town    Discharge Planning:  [place] for rehab with Palliative care service follow-up       Chief Complaint: hypercalcemia, altered mental status  History of Present Illness:   81 year old woman with a history of multiple myeloma, dementia, admitted on 05/16/15 due to confusion. The patient's history is obtained by her daughter. Per her report, patient had presented initially on 8/16 after sustaining a fall due to increased debilitation and confusion, diagnosed with UTI and discharged home with antibiotics. CT of the head at the time was negative.   She returned to the Emergency Room 24 hrs later with increased agitation and confusion, poor oral intake. No fevers or chills were reported. No respiratory or cardiac issues were noted.   Daughter denies patient had nausea, heartburn, abdominal pain or change in bowel habits. Last bowel movement on 8/16. Denied abnormal skin rashes, or neuropathy. Denied any bleeding issues such as epistaxis, hematemesis, hematuria or hematochezia.   She has had decreased ambulation significantly over the last few weeks, worsened by fall on 8/9. She was noted to have hypoglycemia with glucose levels at 27 requiring D50 then to D10 fluids with improvement. Urinalysis was suspicious of UTI for which IV antibiotics were given.    Primary Diagnoses  Present on Admission:  . Anemia . Bilateral lower extremity edema . Hypercalcemia . Multiple myeloma in relapse . Dementia with behavioral disturbance . Essential hypertension  Palliative Review of Systems:    -patient denies positives on review   I have  reviewed the medical record, interviewed the patient and family, and examined the patient. The following aspects are pertinent.  Past Medical  History  Diagnosis Date  . Hypertension   . Insomnia   . Anxiety   . Anemia   . Multiple myeloma     In remission  . Diverticulosis 04/2001  . Perforation bowel   . Ringing in ears     Wears hearing aides to drown out   . Rectal bleeding 02/03/2014  . Anemia in chronic illness 01/24/2015   Social History   Social History  . Marital Status: Divorced    Spouse Name: N/A  . Number of Children: 2  . Years of Education: N/A   Social History Main Topics  . Smoking status: Never Smoker   . Smokeless tobacco: Never Used  . Alcohol Use: No  . Drug Use: No  . Sexual Activity: No   Other Topics Concern  . None   Social History Narrative   Divorced.  Lives with daughter.  Normally ambulates without assistance.   Family History  Problem Relation Age of Onset  . Hypertension Other   . Prostate cancer Father   . Colitis Neg Hx   . Esophageal cancer Neg Hx   . Stomach cancer Neg Hx    Scheduled Meds: . dexamethasone  5 mg Oral Daily  . heparin  5,000 Units Subcutaneous 3 times per day  . memantine  5 mg Oral Q1200  . sodium chloride  3 mL Intravenous Q12H  . tobramycin  1 drop Left Eye 6 times per day   Continuous Infusions: . sodium chloride 100 mL/hr at 05/29/15 1900   PRN Meds:.acetaminophen **OR** acetaminophen, LORazepam, ondansetron **OR** ondansetron (ZOFRAN) IV, promethazine, traMADol, traZODone Medications Prior to Admission:  Prior to Admission medications   Medication Sig Start Date End Date Taking? Authorizing Provider  cholecalciferol (VITAMIN D) 1000 UNITS tablet Take 2,000 Units by mouth daily.   Yes Historical Provider, MD  dexamethasone (DECADRON) 4 MG tablet Take 10 mg by mouth daily.   Yes Historical Provider, MD  LORazepam (ATIVAN) 1 MG tablet Take 1 tablet (1 mg total) by mouth every 8 (eight) hours as needed for anxiety. 05/19/15  Yes Theodis Blaze, MD  memantine (NAMENDA) 5 MG tablet Take 5 mg by mouth daily at 12 noon.    Yes Historical Provider, MD   Multiple Vitamins-Minerals (MULTIVITAMIN & MINERAL PO) Take 1 tablet by mouth daily.   Yes Historical Provider, MD  promethazine (PHENERGAN) 25 MG tablet Take 0.5 tablets (12.5 mg total) by mouth every 6 (six) hours as needed for nausea. 02/05/15  Yes Heath Lark, MD  tobramycin (TOBREX) 0.3 % ophthalmic solution Place 1 drop into the left eye every 4 (four) hours.  04/20/15  Yes Historical Provider, MD  traMADol (ULTRAM) 50 MG tablet Take 1 tablet (50 mg total) by mouth every 6 (six) hours as needed. 05/19/15  Yes Theodis Blaze, MD   Allergies  Allergen Reactions  . Amoxicillin Hives and Itching  . Aspirin Other (See Comments)    Child hood allergy   . Atenolol     Hair loss  . Donepezil Other (See Comments)    agitation   . Nortriptyline     agitation  . Amiodarone Rash  . Latex Dermatitis  . Zoloft [Sertraline Hcl] Anxiety    Increased agitation   CBC:    Component Value Date/Time   WBC 3.2* 05/30/2015 0559   WBC 4.5 05/29/2015 1321  HGB 8.1* 05/30/2015 0559   HGB 10.1* 05/29/2015 1321   HCT 26.2* 05/30/2015 0559   HCT 32.5* 05/29/2015 1321   PLT 208 05/30/2015 0559   PLT 278 05/29/2015 1321   MCV 97.4 05/30/2015 0559   MCV 96.4 05/29/2015 1321   NEUTROABS 3.5 05/29/2015 1321   NEUTROABS 2.2 05/15/2015 2157   LYMPHSABS 0.6* 05/29/2015 1321   LYMPHSABS 0.8 05/15/2015 2157   MONOABS 0.3 05/29/2015 1321   MONOABS 0.4 05/15/2015 2157   EOSABS 0.0 05/29/2015 1321   EOSABS 0.0 05/15/2015 2157   BASOSABS 0.0 05/29/2015 1321   BASOSABS 0.0 05/15/2015 2157   Comprehensive Metabolic Panel:    Component Value Date/Time   NA 140 05/30/2015 0559   NA 142 05/29/2015 1321   K 3.4* 05/30/2015 0559   K 3.7 05/29/2015 1321   CL 110 05/30/2015 0559   CL 103 03/11/2013 1503   CO2 25 05/30/2015 0559   CO2 26 05/29/2015 1321   BUN 19 05/30/2015 0559   BUN 19.2 05/29/2015 1321   CREATININE 0.58 05/30/2015 0559   CREATININE 0.8 05/29/2015 1321   GLUCOSE 64* 05/30/2015 0559    GLUCOSE 78 05/29/2015 1321   GLUCOSE 91 03/11/2013 1503   CALCIUM 8.9 05/30/2015 0559   CALCIUM 10.9* 05/29/2015 1321   CALCIUM 11.4* 12/30/2014 0835   AST 26 05/30/2015 0559   AST 25 05/29/2015 1321   ALT 23 05/30/2015 0559   ALT 30 05/29/2015 1321   ALKPHOS 55 05/30/2015 0559   ALKPHOS 74 05/29/2015 1321   BILITOT 0.7 05/30/2015 0559   BILITOT 0.68 05/29/2015 1321   PROT 7.1 05/30/2015 0559   PROT 9.5* 05/29/2015 1321   ALBUMIN 2.2* 05/30/2015 0559   ALBUMIN 2.6* 05/29/2015 1321    Physical Exam:  Vital Signs: BP 133/55 mmHg  Pulse 54  Temp(Src) 98.1 F (36.7 C) (Oral)  Resp 18  Ht 5' (1.524 m)  Wt 46.222 kg (101 lb 14.4 oz)  BMI 19.90 kg/m2  SpO2 98% SpO2: SpO2: 98 % O2 Device: O2 Device: Not Delivered O2 Flow Rate:   Intake/output summary:  Intake/Output Summary (Last 24 hours) at 05/30/15 1407 Last data filed at 05/30/15 0800  Gross per 24 hour  Intake   1560 ml  Output    700 ml  Net    860 ml   LBM: Last BM Date: 05/29/15 Baseline Weight: Weight: 46.222 kg (101 lb 14.4 oz) Most recent weight: Weight: 46.222 kg (101 lb 14.4 oz)  Exam Findings:   General: frail elderly pleasantly confused HEENT:  CVS: RRR Resp: CTA Abd: soft NT +BS Extrem: without edema Skin: warm and dry Neuro: intermittently confused, difficulty concentrating            Palliative Performance Scale: 30 %                Additional Data Reviewed: Recent Labs     05/29/15  1321  05/29/15  2210  05/30/15  0559  WBC  4.5   --   3.2*  HGB  10.1*   --   8.1*  PLT  278   --   208  NA  142  141  140  BUN  19.2  21*  19  CREATININE  0.8  0.68  0.58     Time In: 1100 Time Out: 1200 Time Total: 60 minutes  Greater than 50%  of this time was spent counseling and coordinating care related to the above assessment and plan.  Discussed with  Denice Bors  Signed by: Wadie Lessen, NP  Knox Royalty, NP  05/30/2015, 2:07 PM  Please contact Palliative Medicine Team phone at  [phone] for questions and concerns.   See AMION for contact information

## 2015-05-31 DIAGNOSIS — F0391 Unspecified dementia with behavioral disturbance: Secondary | ICD-10-CM | POA: Diagnosis not present

## 2015-05-31 DIAGNOSIS — D630 Anemia in neoplastic disease: Secondary | ICD-10-CM | POA: Diagnosis not present

## 2015-05-31 LAB — KAPPA/LAMBDA LIGHT CHAINS
KAPPA FREE LGHT CHN: 1.36 mg/dL (ref 0.33–1.94)
KAPPA LAMBDA RATIO: 0 — AB (ref 0.26–1.65)
LAMBDA FREE LGHT CHN: 298 mg/dL — AB (ref 0.57–2.63)

## 2015-05-31 LAB — SPEP & IFE WITH QIG
ALPHA-2-GLOBULIN: 1 g/dL — AB (ref 0.5–0.9)
Abnormal Protein Band1: 3.4 g/dL
Albumin ELP: 3.3 g/dL — ABNORMAL LOW (ref 3.8–4.8)
Alpha-1-Globulin: 0.4 g/dL — ABNORMAL HIGH (ref 0.2–0.3)
BETA GLOBULIN: 0.4 g/dL (ref 0.4–0.6)
Beta 2: 0.3 g/dL (ref 0.2–0.5)
Gamma Globulin: 3.7 g/dL — ABNORMAL HIGH (ref 0.8–1.7)
IgA: 28 mg/dL — ABNORMAL LOW (ref 69–380)
IgG (Immunoglobin G), Serum: 3820 mg/dL — ABNORMAL HIGH (ref 690–1700)
IgM, Serum: <5 mg/dL — ABNORMAL LOW (ref 52–322)
Total Protein, Serum Electrophoresis: 9.1 g/dL — ABNORMAL HIGH (ref 6.1–8.1)

## 2015-05-31 LAB — COMPREHENSIVE METABOLIC PANEL
ALT: 20 U/L (ref 14–54)
AST: 19 U/L (ref 15–41)
Albumin: 2.1 g/dL — ABNORMAL LOW (ref 3.5–5.0)
Alkaline Phosphatase: 51 U/L (ref 38–126)
Anion gap: 4 — ABNORMAL LOW (ref 5–15)
BUN: 15 mg/dL (ref 6–20)
CHLORIDE: 112 mmol/L — AB (ref 101–111)
CO2: 23 mmol/L (ref 22–32)
CREATININE: 0.59 mg/dL (ref 0.44–1.00)
Calcium: 8.7 mg/dL — ABNORMAL LOW (ref 8.9–10.3)
Glucose, Bld: 69 mg/dL (ref 65–99)
POTASSIUM: 3.4 mmol/L — AB (ref 3.5–5.1)
Sodium: 139 mmol/L (ref 135–145)
Total Bilirubin: 0.7 mg/dL (ref 0.3–1.2)
Total Protein: 7.3 g/dL (ref 6.5–8.1)

## 2015-05-31 MED ORDER — TRAMADOL HCL 50 MG PO TABS: 50.0000 mg | ORAL_TABLET | Freq: Four times a day (QID) | ORAL | Status: DC | PRN

## 2015-05-31 MED ORDER — LORAZEPAM 1 MG PO TABS: 1.0000 mg | ORAL_TABLET | Freq: Three times a day (TID) | ORAL | Status: DC | PRN

## 2015-05-31 NOTE — Clinical Social Work Note (Signed)
Clinical Social Work Assessment  Patient Details  Name: Victoria [name] MRN: [id number] Date of Birth: 31-Aug-1932  Date of referral:  05/31/15               Reason for consult:  Facility Placement                Permission sought to share information with:  Chartered certified accountant granted to share information::  Yes, Verbal Permission Granted  Name::        Agency::     Relationship::     Contact Information:     Housing/Transportation Living arrangements for the past 2 months:  [place], [place] of Information:  Adult Children Patient Interpreter Needed:  None Criminal Activity/Legal Involvement Pertinent to Current Situation/Hospitalization:  No - Comment as needed Significant Relationships:  Adult Children Lives with:  Adult Children Do you feel safe going back to the place where you live?  No Need for family participation in patient care:  Yes (Comment)  Care giving concerns:  CSW received consult that patient was admitted from [facility]. Patient is known to this CSW as patient was just discharged from [facility] 8/22.    Social Worker assessment / plan:  CSW spoke with patient's daughter at bedside, Victoria [name] who informed CSW that she would like to find another SNF for her mother to go to - requesting [facility] in [place].   Employment status:  Retired Nurse, adult PT Recommendations:  [place] / Referral to community resources:  [place]  Patient/Family's Response to care:  CSW confirmed with Heather at [facility] that they would be able to offer a bed. Patient toured last night & signed admission paperwork.   Patient/Family's Understanding of and Emotional Response to Diagnosis, Current Treatment, and Prognosis:  Patient's daughter feels assured that she will receive good care at [facility] and necessary rehab before returning home with daughter.    Emotional Assessment Appearance:  Appears stated age Attitude/Demeanor/Rapport:    Affect (typically observed):    Orientation:  Oriented to Self, Oriented to Place Alcohol / Substance use:    Psych involvement (Current and /or in the community):     Discharge Needs  Concerns to be addressed:    Readmission within the last 30 days:    Current discharge risk:    Barriers to Discharge:      Standley Brooking, LCSW 05/31/2015, 9:04 AM

## 2015-05-31 NOTE — Clinical Social Work Placement (Signed)
Patient is set to discharge to [facility] today. Patient & daughter, Ebony Hail at bedside aware. Discharge packet given to RN, Loren. [facility] called for transport.     Raynaldo Opitz, [facility] Clinical Social Worker cell #: [phone]    CLINICAL SOCIAL WORK PLACEMENT  NOTE  Date:  05/31/2015  Patient Details  Name: LOXLEY CIBRIAN MRN: [id number] Date of Birth: [birth year]  Clinical Social Work is seeking post-discharge placement for this patient at the [place] level of care (*CSW will initial, date and re-position this form in  chart as items are completed):  Yes   Patient/family provided with [place] Work Department's list of facilities offering this level of care within the geographic area requested by the patient (or if unable, by the patient's family).  Yes   Patient/family informed of their freedom to choose among providers that offer the needed level of care, that participate in Medicare, Medicaid or managed care program needed by the patient, have an available bed and are willing to accept the patient.  Yes   Patient/family informed of 's ownership interest in [facility] and [facility], as well as of the fact that they are under no obligation to receive care at these facilities.  PASRR submitted to EDS on 05/31/15     PASRR number received on 05/31/15     Existing PASRR number confirmed on       FL2 transmitted to all facilities in geographic area requested by pt/family on 05/31/15     FL2 transmitted to all facilities within larger geographic area on       Patient informed that his/her managed care company has contracts with or will negotiate with certain facilities, including the following:        Yes   Patient/family informed of bed offers received.  Patient chooses bed at [place], [place]     Physician recommends and patient chooses bed at      Patient to be  transferred to [facility], [place] on 05/31/15.  Patient to be transferred to facility by [facility]     Patient family notified on 05/31/15 of transfer.  Name of family member notified:  patient's daughter, Ebony Hail at bedside     PHYSICIAN       Additional Comment:    _______________________________________________ Standley Brooking, LCSW 05/31/2015, 2:32 PM

## 2015-05-31 NOTE — Discharge Summary (Signed)
Physician Discharge Summary  ELZADA PYTEL PZW:[id number] DOB: March 14, 1932 DOA: 05/29/2015  PCP: Heath Lark, MD  Admit date: 05/29/2015 Discharge date: 05/31/2015  Time spent: 45 minutes  Recommendations for Outpatient Follow-up:  -Will be discharged to SNF today. -To follow up with oncologist, Dr. Alvy Bimler, as scheduled.   Discharge Diagnoses:  Principal Problem:   Hypercalcemia Active Problems:   Anemia   Essential hypertension   Dementia with behavioral disturbance   Multiple myeloma in relapse   Weakness   Bilateral lower extremity edema   Discharge Condition: Stable and improved  Filed Weights   05/29/15 1600  Weight: 46.222 kg (101 lb 14.4 oz)    History of present illness:  As per Dr. Marily Memos 8/30: Patient states that since discharge from the hospital on 05/21/2015 she had been in her normal state of health. States that she went for evaluation and lab work at her outpatient oncology office and was called with subsequent result showing hypercalcemia was told to come to [facility]. For admission. Since discharge patient has been a nursing home patient at [facility]. Patient states that the only adverse event she's had since her discharge was when she was given a dose of Seroquel at night for which she felt very out of it for an extended period of time anfd has not had any subsequent doses of medicine. Condition is constant. Nothing makes the condition better or worse. Nothing is being given to reverse the condition.  Hospital Course:   Hypercalcemia -Resolved upon DC with IVF and pamidronate.  Anemia of Chronic Disease -Secondary to multiple myeloma. -No indications for transfusion this admission.  HTN -Stable.  Dementia -At baseline. -Is very sensitive to new medications; apparently at prior SNF had significant somnolence with both seroquel and namenda.   Multiple Myeloma, in relapse -Will f/u with oncology as an OP as scheduled. -Has had poor tolerance  to treatment with multiple complications. -Had recurrence of hypercalcemia once treatment was placed on hold. -Per onc recommendations, will continue decadron upon DC.  Generalized Weakness -To SNF per PT recommendations.  Procedures:  None   Consultations:  None  Discharge Instructions  Discharge Instructions    Increase activity slowly    Complete by:  As directed             Medication List    STOP taking these medications        memantine 5 MG tablet  Commonly known as:  NAMENDA     tobramycin 0.3 % ophthalmic solution  Commonly known as:  TOBREX      TAKE these medications        cholecalciferol 1000 UNITS tablet  Commonly known as:  VITAMIN D  Take 2,000 Units by mouth daily.     dexamethasone 4 MG tablet  Commonly known as:  DECADRON  Take 10 mg by mouth daily.     LORazepam 1 MG tablet  Commonly known as:  ATIVAN  Take 1 tablet (1 mg total) by mouth every 8 (eight) hours as needed for anxiety.     MULTIVITAMIN & MINERAL PO  Take 1 tablet by mouth daily.     promethazine 25 MG tablet  Commonly known as:  PHENERGAN  Take 0.5 tablets (12.5 mg total) by mouth every 6 (six) hours as needed for nausea.     traMADol 50 MG tablet  Commonly known as:  ULTRAM  Take 1 tablet (50 mg total) by mouth every 6 (six) hours as needed.  Allergies  Allergen Reactions  . Amoxicillin Hives and Itching  . Aspirin Other (See Comments)    Child hood allergy   . Atenolol     Hair loss  . Donepezil Other (See Comments)    agitation   . Nortriptyline     agitation  . Amiodarone Rash  . Latex Dermatitis  . Zoloft [Sertraline Hcl] Anxiety    Increased agitation       Follow-up Information    Follow up with [facility], [place], MD. Schedule an appointment as soon as possible for a visit in 2 weeks.   Specialty:  Hematology and Oncology   Contact information:   [place] [id number] [phone]        The results of significant  diagnostics from this hospitalization (including imaging, microbiology, ancillary and laboratory) are listed below for reference.    Significant Diagnostic Studies: Dg Chest 2 View  05/15/2015   CLINICAL DATA:  Acute onset of altered mental status and weakness. Initial encounter.  EXAM: CHEST  2 VIEW  COMPARISON:  Chest radiograph performed 06/20/2011  FINDINGS: The lungs are hypoexpanded. Vascular crowding and vascular congestion are seen, with minimal bilateral atelectasis. There is no evidence of pleural effusion or pneumothorax.  The heart is normal in size; the mediastinal contour is within normal limits. No acute osseous abnormalities are seen.  IMPRESSION: Lungs hypoexpanded. Vascular congestion noted, with minimal bilateral atelectasis.   Electronically Signed   By: Garald Balding M.D.   On: 05/15/2015 22:35   Ct Head Wo Contrast  05/14/2015   CLINICAL DATA:  Fall last Tuesday.  Intracranial hemorrhage.  EXAM: CT HEAD WITHOUT CONTRAST  TECHNIQUE: Contiguous axial images were obtained from the base of the skull through the vertex without intravenous contrast.  COMPARISON:  05/08/2015.  FINDINGS: Multiple lytic lesions are present throughout the calvarium compatible with history of multiple myeloma. No mass lesion, mass effect, midline shift, hydrocephalus, hemorrhage. No acute territorial cortical ischemia/infarct. Atrophy and chronic ischemic white matter disease is present. Soft tissue swelling is present inferior to the occiput which is a chronic finding in this patient, some of which is probably due to partial volume averaging. Intracranial atherosclerosis. The visible paranasal sinuses are within normal limits.  IMPRESSION: No acute intracranial abnormality. Atrophy and chronic ischemic white matter disease.   Electronically Signed   By: Dereck Ligas M.D.   On: 05/14/2015 16:25   Ct Head Wo Contrast  05/08/2015   CLINICAL DATA:  Pain following fall.  Known multiple myeloma.  EXAM: CT HEAD  WITHOUT CONTRAST  TECHNIQUE: Contiguous axial images were obtained from the base of the skull through the vertex without intravenous contrast.  COMPARISON:  Head CT and brain MRI December 30, 2014  FINDINGS: Mild diffuse atrophy is stable. There is no intracranial mass, hemorrhage, extra-axial fluid collection, or midline shift. Small vessel disease in the centra semiovale bilaterally appear stable. No new gray-white compartment lesions. No acute infarct apparent.  Multiple lytic lesions consistent with multiple myeloma are noted throughout the calvarium, stable. A lytic lesion is noted in the right clivus region, stable. Mastoid air cells bilaterally are clear.  IMPRESSION: Multiple lytic bone lesions consistent with multiple myeloma, stable. Atrophy with supratentorial small vessel disease is stable. No intracranial mass, hemorrhage, or extra-axial fluid collection. No demonstrable acute infarct.   Electronically Signed   By: Lowella Grip III M.D.   On: 05/08/2015 22:57   Dg Shoulder Left  05/08/2015   CLINICAL DATA:  Left shoulder pain, fall in bathroom  EXAM: LEFT SHOULDER - 2+ VIEW  COMPARISON:  None.  FINDINGS: Two views of the left shoulder submitted. No acute fracture or subluxation. Degenerative changes AC joint. Inferior spurring of acromion. Minimal degenerative changes glenohumeral joint.  IMPRESSION: No acute fracture or subluxation. Degenerative changes as described above.   Electronically Signed   By: Lahoma Crocker M.D.   On: 05/08/2015 21:45   Dg Hip Unilat With Pelvis 2-3 Views Left  05/08/2015   CLINICAL DATA:  Status post fall in bathroom, with left hip pain. Initial encounter.  EXAM: DG HIP (WITH OR WITHOUT PELVIS) 2-3V LEFT  COMPARISON:  CT of the abdomen and pelvis performed 02/22/2011, and abdominal radiograph performed 02/23/2011  FINDINGS: There is mild cortical irregularity involving the left superior pubic ramus, suspicious for an acute left superior pubic ramus fracture. A left inferior  pubic ramus fracture may also be present.  Both femoral heads are seated normally within their respective acetabula. The proximal left femur appears intact. No significant degenerative change is appreciated. The sacroiliac joints are unremarkable in appearance. A large lytic lesion at the right iliac wing reflects the patient's known multiple myeloma, more prominent than in 2012. Tiny lucencies within the visualized osseous structures likely also reflect the patient's multiple myeloma.  The visualized bowel gas pattern is grossly unremarkable in appearance. Scattered phleboliths are noted within the pelvis.  IMPRESSION: 1. Mild cortical irregularity involving the left superior pubic ramus, suspicious for an acute left superior pubic ramus fracture. Left inferior pubic ramus fracture is also suspected. 2. Increasing size of large lytic lesion at the right iliac wing, reflecting the patient's known multiple myeloma. Scattered tiny lucencies within the visualized osseous structures likely also reflect multiple myeloma.   Electronically Signed   By: Garald Balding M.D.   On: 05/08/2015 21:52    Microbiology: Recent Results (from the past 240 hour(s))  MRSA PCR Screening     Status: None   Collection Time: 05/29/15  4:47 PM  Result Value Ref Range Status   MRSA by PCR NEGATIVE NEGATIVE Final    Comment:        The GeneXpert MRSA Assay (FDA approved for NASAL specimens only), is one component of a comprehensive MRSA colonization surveillance program. It is not intended to diagnose MRSA infection nor to guide or monitor treatment for MRSA infections.   Urine culture     Status: None (Preliminary result)   Collection Time: 05/30/15  5:16 PM  Result Value Ref Range Status   Specimen Description URINE, CLEAN CATCH  Final   Special Requests NONE  Final   Culture   Final    NO GROWTH < 24 HOURS Performed at [facility]    Report Status PENDING  Incomplete     Labs: Basic Metabolic  Panel:  Recent Labs Lab 05/29/15 1321 05/29/15 2210 05/30/15 0559 05/31/15 0530  NA 142 141 140 139  K 3.7 3.4* 3.4* 3.4*  CL  --  110 110 112*  CO2 $Re'26 27 25 23  'sre$ GLUCOSE 78 102* 64* 69  BUN 19.2 21* 19 15  CREATININE 0.8 0.68 0.58 0.59  CALCIUM 10.9* 8.9 8.9 8.7*  MG  --  1.5* 1.4*  --   PHOS  --  2.7  --   --    Liver Function Tests:  Recent Labs Lab 05/29/15 1321 05/30/15 0559 05/31/15 0530  AST $Re'25 26 19  'LoJ$ ALT $R'30 23 20  'kt$ ALKPHOS 74 55 51  BILITOT 0.68 0.7 0.7  PROT 9.5* 7.1 7.3  ALBUMIN 2.6* 2.2* 2.1*   No results for input(s): LIPASE, AMYLASE in the last 168 hours. No results for input(s): AMMONIA in the last 168 hours. CBC:  Recent Labs Lab 05/29/15 1321 05/30/15 0559  WBC 4.5 3.2*  NEUTROABS 3.5  --   HGB 10.1* 8.1*  HCT 32.5* 26.2*  MCV 96.4 97.4  PLT 278 208   Cardiac Enzymes: No results for input(s): CKTOTAL, CKMB, CKMBINDEX, TROPONINI in the last 168 hours. BNP: BNP (last 3 results) No results for input(s): BNP in the last 8760 hours.  ProBNP (last 3 results) No results for input(s): PROBNP in the last 8760 hours.  CBG: No results for input(s): GLUCAP in the last 168 hours.     SignedLelon Frohlich  [facility] Pager: [phone] 05/31/2015, 11:40 AM

## 2015-05-31 NOTE — Care Management Note (Signed)
Case Management Note  Patient Details  Name: Victoria [name] MRN: [id number] Date of Birth: [birth year]  Subjective/Objective:condition code 93 given yesterday.copy in shadow chart.                    Action/Plan:d/c SNF.   Expected Discharge Date:   (unknown)               Expected Discharge Plan:  [place]  In-House Referral:     Discharge planning Services  CM Consult  Post Acute Care Choice:    Choice offered to:     DME Arranged:    DME Agency:     HH Arranged:    [place] Agency:     Status of Service:  Completed, signed off  Medicare Important Message Given:    Date Medicare IM Given:    Medicare IM give by:    Date Additional Medicare IM Given:    Additional Medicare Important Message give by:     If discussed at [place] of Stay Meetings, dates discussed:    Additional Comments:  Dessa Phi, RN 05/31/2015, 12:09 PM

## 2015-05-31 NOTE — Progress Notes (Signed)
Pt discharge to nursing facility. Report called to [facility]. All questions answered. Pt transported via ambulance transport. AVS given to daughter.

## 2015-05-31 NOTE — Clinical Social Work Placement (Signed)
Patient has a bed at [facility] when ready for discharge.     Raynaldo Opitz, [facility] Clinical Social Worker cell #: [phone]    CLINICAL SOCIAL WORK PLACEMENT  NOTE  Date:  05/31/2015  Patient Details  Name: CHARENE MCCALLISTER MRN: [id number] Date of Birth: [birth year]  Clinical Social Work is seeking post-discharge placement for this patient at the [place] level of care (*CSW will initial, date and re-position this form in  chart as items are completed):  Yes   Patient/family provided with [place] Work Department's list of facilities offering this level of care within the geographic area requested by the patient (or if unable, by the patient's family).  Yes   Patient/family informed of their freedom to choose among providers that offer the needed level of care, that participate in Medicare, Medicaid or managed care program needed by the patient, have an available bed and are willing to accept the patient.  Yes   Patient/family informed of [place]'s ownership interest in [facility] and [facility], as well as of the fact that they are under no obligation to receive care at these facilities.  PASRR submitted to EDS on 05/31/15     PASRR number received on 05/31/15     Existing PASRR number confirmed on       FL2 transmitted to all facilities in geographic area requested by pt/family on 05/31/15     FL2 transmitted to all facilities within larger geographic area on       Patient informed that his/her managed care company has contracts with or will negotiate with certain facilities, including the following:        Yes   Patient/family informed of bed offers received.  Patient chooses bed at [place], [place]     Physician recommends and patient chooses bed at      Patient to be transferred to [place], [place] on  .  Patient to be transferred to facility by        Patient family notified on   of transfer.  Name of family member notified:        PHYSICIAN       Additional Comment:    _______________________________________________ Standley Brooking, LCSW 05/31/2015, 9:06 AM

## 2015-06-01 ENCOUNTER — Telehealth: Payer: Self-pay | Admitting: Hematology and Oncology

## 2015-06-01 LAB — URINE CULTURE

## 2015-06-01 NOTE — Telephone Encounter (Signed)
Returned casey call from [facility].Victoria [name] pt for f/u appt....she will contact pt dtr

## 2015-06-05 ENCOUNTER — Ambulatory Visit: Payer: Medicare Other | Admitting: Hematology and Oncology

## 2015-06-07 ENCOUNTER — Telehealth: Payer: Self-pay | Admitting: Hematology and Oncology

## 2015-06-07 DIAGNOSIS — Z7189 Other specified counseling: Secondary | ICD-10-CM

## 2015-06-07 DIAGNOSIS — Z515 Encounter for palliative care: Secondary | ICD-10-CM

## 2015-06-07 DIAGNOSIS — R531 Weakness: Secondary | ICD-10-CM

## 2015-06-07 NOTE — Telephone Encounter (Signed)
s.w. pt dtr and cx appt due to pt in rehab she will call back to r/s

## 2015-06-11 ENCOUNTER — Ambulatory Visit: Payer: Self-pay | Admitting: Hematology and Oncology

## 2015-06-14 ENCOUNTER — Emergency Department (HOSPITAL_COMMUNITY): Payer: Medicare Other

## 2015-06-14 ENCOUNTER — Emergency Department (HOSPITAL_COMMUNITY)
Admission: EM | Admit: 2015-06-14 | Discharge: 2015-06-15 | Disposition: A | Discharge status: Home / Self Care | Payer: Medicare Other | Attending: Emergency Medicine | Admitting: Emergency Medicine

## 2015-06-14 ENCOUNTER — Encounter (HOSPITAL_COMMUNITY): Payer: Self-pay

## 2015-06-14 DIAGNOSIS — R410 Disorientation, unspecified: Secondary | ICD-10-CM | POA: Insufficient documentation

## 2015-06-14 DIAGNOSIS — Z862 Personal history of diseases of the blood and blood-forming organs and certain disorders involving the immune mechanism: Secondary | ICD-10-CM | POA: Insufficient documentation

## 2015-06-14 DIAGNOSIS — Z79899 Other long term (current) drug therapy: Secondary | ICD-10-CM | POA: Insufficient documentation

## 2015-06-14 DIAGNOSIS — I10 Essential (primary) hypertension: Secondary | ICD-10-CM | POA: Diagnosis not present

## 2015-06-14 DIAGNOSIS — R4182 Altered mental status, unspecified: Secondary | ICD-10-CM | POA: Diagnosis present

## 2015-06-14 DIAGNOSIS — Z91040 Latex allergy status: Secondary | ICD-10-CM | POA: Insufficient documentation

## 2015-06-14 DIAGNOSIS — Z880 Allergy status to penicillin: Secondary | ICD-10-CM | POA: Insufficient documentation

## 2015-06-14 LAB — CBC
HCT: 32 % — ABNORMAL LOW (ref 36.0–46.0)
Hemoglobin: 9.9 g/dL — ABNORMAL LOW (ref 12.0–15.0)
MCH: 29.6 pg (ref 26.0–34.0)
MCHC: 30.9 g/dL (ref 30.0–36.0)
MCV: 95.5 fL (ref 78.0–100.0)
PLATELETS: 167 10*3/uL (ref 150–400)
RBC: 3.35 MIL/uL — ABNORMAL LOW (ref 3.87–5.11)
RDW: 17.8 % — AB (ref 11.5–15.5)
WBC: 4 10*3/uL (ref 4.0–10.5)

## 2015-06-14 LAB — COMPREHENSIVE METABOLIC PANEL
ALK PHOS: 49 U/L (ref 38–126)
ALT: 26 U/L (ref 14–54)
AST: 25 U/L (ref 15–41)
Albumin: 2.6 g/dL — ABNORMAL LOW (ref 3.5–5.0)
Anion gap: 6 (ref 5–15)
BILIRUBIN TOTAL: 0.7 mg/dL (ref 0.3–1.2)
BUN: 37 mg/dL — AB (ref 6–20)
CALCIUM: 9.4 mg/dL (ref 8.9–10.3)
CHLORIDE: 105 mmol/L (ref 101–111)
CO2: 28 mmol/L (ref 22–32)
CREATININE: 0.79 mg/dL (ref 0.44–1.00)
GFR calc Af Amer: >60 mL/min (ref >60)
Glucose, Bld: 172 mg/dL — ABNORMAL HIGH (ref 65–99)
Potassium: 3.8 mmol/L (ref 3.5–5.1)
Sodium: 139 mmol/L (ref 135–145)
Total Protein: 8.3 g/dL — ABNORMAL HIGH (ref 6.5–8.1)

## 2015-06-14 LAB — CBG MONITORING, ED: GLUCOSE-CAPILLARY: 147 mg/dL — AB (ref 65–99)

## 2015-06-14 MED ORDER — SODIUM CHLORIDE 0.9 % IV BOLUS (SEPSIS): 500.0000 mL | Freq: Once | INTRAVENOUS | Status: DC

## 2015-06-14 NOTE — ED Notes (Signed)
Pt is aware urine is needed for testing, pt states she is unable to urinate at this time.

## 2015-06-14 NOTE — ED Provider Notes (Signed)
CSN: [id number]     Arrival date & time 06/14/15  2125 History   First MD Initiated Contact with Patient 06/14/15 2153     Chief Complaint  Patient presents with  . Altered Mental Status     (Consider location/radiation/quality/duration/timing/severity/associated sxs/prior Treatment) HPI 81 year old female who presents with altered mental status. She has a history of hypertension, multiple myeloma in remission, anxiety. she currently resides in a nursing facility. She was brought in by her daughter today for concern of altered mental status for 25 minutes. She reports that she was in her usual state of health, and after dinner her mother went to the bathroom with her walker. Upon return from the restroom her daughter noticed that she was ambulating with her walker backwards. She had told her mother that she was holding her walker incorrectly. Instead of turning her walker around, the patient turned in a circle with her walker. Her daughter states that she kept trying to tell her mother that she was holding her walker incorrectly, and was concerning to her because her mother would not say anything back to her. She reports that her mother does not usually become nonverbal, and this was concerning to her. She also states that her mother had refused to make eye contact with her, looking down during this time. She subsequently called the nursing assistant, who sent the patient down. She gave her mother dependent pencil, and ask her to write down her answers. She then asked her mother what was wrong, and she wrote down nothing. She then asked her mother if she could talk to her, and she wrote down no. EMS then arrived and brought her to the ED. When EMS arrived, patient began talking again. He should reports that she knew everything that her daughter was saying to her, and that she felt that her daughter was being to "sassy. " she says that she had just tried to ignore her daughter during that time. She and her  daughter denies any recent illnesses, including fever, chills, nausea, vomiting, diarrhea, abdominal pain, chest pain, cough, shortness of breath, urinary symptoms or syncope.   Past Medical History  Diagnosis Date  . Hypertension   . Insomnia   . Anxiety   . Anemia   . Multiple myeloma     In remission  . Diverticulosis 04/2001  . Perforation bowel   . Ringing in ears     Wears hearing aides to drown out   . Rectal bleeding 02/03/2014  . Anemia in chronic illness 01/24/2015   Past Surgical History  Procedure Laterality Date  . Sigmoid resection / rectopexy  01/2011    perforation/stoma  . Colonoscopy    . Partial hysterectomy    . Cesarean section    . Colostomy takedown  06/27/11  . Cholecystectomy  2012    laparoscopic   Family History  Problem Relation Age of Onset  . Hypertension Other   . Prostate cancer Father   . Colitis Neg Hx   . Esophageal cancer Neg Hx   . Stomach cancer Neg Hx    Social History  Substance Use Topics  . Smoking status: Never Smoker   . Smokeless tobacco: Never Used  . Alcohol Use: No   OB History    No data available     Review of Systems 10/14 systems reviewed and are negative other than those stated in the HPI   Allergies  Amoxicillin; Aspirin; Atenolol; Donepezil; Doxycycline; Nortriptyline; Amiodarone; Latex; and Zoloft  Home Medications  Prior to Admission medications   Medication Sig Start Date End Date Taking? Authorizing Provider  Amino Acids-Protein Hydrolys (FEEDING SUPPLEMENT, PRO-STAT SUGAR FREE 64,) LIQD Take 30 mLs by mouth 2 (two) times daily.   Yes Historical Provider, MD  buPROPion (WELLBUTRIN) 75 MG tablet Take 75 mg by mouth 2 (two) times daily.   Yes Historical Provider, MD  cholecalciferol (VITAMIN D) 1000 UNITS tablet Take 2,000 Units by mouth daily.   Yes Historical Provider, MD  dexamethasone (DECADRON) 4 MG tablet Take 10 mg by mouth daily.   Yes Historical Provider, MD  divalproex (DEPAKOTE SPRINKLE) 125  MG capsule Take 125 mg by mouth at bedtime.   Yes Historical Provider, MD  LORazepam (ATIVAN) 1 MG tablet Take 1 tablet (1 mg total) by mouth every 8 (eight) hours as needed for anxiety. 05/31/15  Yes Estela Leonie Green, MD  Multiple Vitamins-Minerals (MULTIVITAMIN & MINERAL PO) Take 1 tablet by mouth daily.   Yes Historical Provider, MD  promethazine (PHENERGAN) 25 MG tablet Take 0.5 tablets (12.5 mg total) by mouth every 6 (six) hours as needed for nausea. 02/05/15  Yes Heath Lark, MD  traMADol (ULTRAM) 50 MG tablet Take 1 tablet (50 mg total) by mouth every 6 (six) hours as needed. 05/31/15  Yes Erline Hau, MD   BP 148/71 mmHg  Pulse 53  Temp(Src) 97.7 F (36.5 C) (Oral)  Resp 17  Ht $R'5\' 1"'pq$  (1.549 m)  Wt 105 lb (47.628 kg)  BMI 19.85 kg/m2  SpO2 100% Physical Exam Physical Exam  Nursing note and vitals reviewed. Constitutional: Elderly, well developed, well nourished, non-toxic, and in no acute distress Head: Normocephalic and atraumatic.  Mouth/Throat: Oropharynx is clear and moist.  Neck: Normal range of motion. Neck supple.  Cardiovascular: Normal rate and regular rhythm.  No edema. Pulmonary/Chest: Effort normal and breath sounds normal.  Abdominal: Soft. There is no tenderness. There is no rebound and no guarding.  Musculoskeletal: Normal range of motion.  Neurological: Alert, no facial droop, fluent speech, moves all extremities symmetrically Skin: Skin is warm and dry.  Psychiatric: Cooperative  ED Course  Procedures (including critical care time) Labs Review Labs Reviewed  COMPREHENSIVE METABOLIC PANEL - Abnormal; Notable for the following:    Glucose, Bld 172 (*)    BUN 37 (*)    Total Protein 8.3 (*)    Albumin 2.6 (*)    All other components within normal limits  CBC - Abnormal; Notable for the following:    RBC 3.35 (*)    Hemoglobin 9.9 (*)    HCT 32.0 (*)    RDW 17.8 (*)    All other components within normal limits  CBG MONITORING, ED -  Abnormal; Notable for the following:    Glucose-Capillary 147 (*)    All other components within normal limits  URINALYSIS W MICROSCOPIC    Imaging Review Dg Chest 2 View  06/14/2015   CLINICAL DATA:  81 year old female with confusion.  EXAM: CHEST  2 VIEW  COMPARISON:  05/15/2015  FINDINGS: Very low lung volumes persist. There is unchanged vascular congestion. Increased bibasilar atelectasis. Cardiomediastinal contours are unchanged, partially obscured. No confluent airspace disease, large pleural effusion or pneumothorax. Diffuse bony under mineralization.  IMPRESSION: Very low lung volumes with increasing bibasilar atelectasis. Unchanged vascular congestion.   Electronically Signed   By: Jeb Levering M.D.   On: 06/14/2015 23:58   I have personally reviewed and evaluated these images and lab results as part of my  medical decision-making.   EKG Interpretation   Date/Time:  Thursday June 14 2015 22:24:06 EDT Ventricular Rate:  57 PR Interval:  134 QRS Duration: 91 QT Interval:  492 QTC Calculation: 479 R Axis:   62 Text Interpretation:  Sinus rhythm No significant change since last  tracing Confirmed by Bliss Tsang MD, Guthrie Lemme [phone]) on 06/14/2015 11:46:01 PM      MDM   Final diagnoses:  Confusion    In short, this is an 81 year old female who presents with possible confusion and nonverbal episode, but now at baseline.  She is well-appearing and in no acute distress. Vital signs are not concerning. Exam is otherwise unremarkable and nonfocal. We'll rule out any infectious, electrolyte, or metabolic derangements. We'll obtain also CT scan of her head. My concern for stroke is currently very low, and symptoms not consistent with true expressive aphasia. CBC and CMP unremarkable and at baseline. CXR showing no acute cardiopulmonary processes. Pending CT head and UA. If unremarkable, may be discharged back to nursing facility.   Forde Dandy, MD 06/15/15 [phone]

## 2015-06-14 NOTE — ED Notes (Addendum)
Pt comes from [facility] via [facility], pt was sitting with daughter after dinner and then stopped talking all of a sudden. Pt reports that she was just angry with her daughter and that's why she wouldn't talk. Pt has no facial droop, stroke screen was negative with EMS, pt daughter wanted her evaluated. Equal grips, no slurred speech. Last seen normal 1900

## 2015-06-15 ENCOUNTER — Emergency Department (HOSPITAL_COMMUNITY): Payer: Medicare Other

## 2015-06-15 ENCOUNTER — Ambulatory Visit: Payer: Medicare Other | Admitting: Neurology

## 2015-06-15 LAB — URINALYSIS W MICROSCOPIC (NOT AT ARMC)
BILIRUBIN URINE: NEGATIVE
Glucose, UA: NEGATIVE mg/dL
KETONES UR: NEGATIVE mg/dL
LEUKOCYTES UA: NEGATIVE
NITRITE: NEGATIVE
PROTEIN: 100 mg/dL — AB
SPECIFIC GRAVITY, URINE: 1.024 (ref 1.005–1.030)
Urobilinogen, UA: 0.2 mg/dL (ref 0.0–1.0)
pH: 5 (ref 5.0–8.0)

## 2015-06-15 NOTE — ED Notes (Signed)
Reported to Dr. Lita Mains that patient's bladder was drained during in and out, total output 224mL. MD acknowledges, no new orders.

## 2015-06-15 NOTE — Discharge Instructions (Signed)
Return without fail for worsening symptoms, including persistent confusion, fever, or any other symptoms concerning to you.  Confusion Confusion is the inability to think with your usual speed or clarity. Confusion may come on quickly or slowly over time. How quickly the confusion comes on depends on the cause. Confusion can be due to any number of causes. CAUSES   Concussion, head injury, or head trauma.  Seizures.  Stroke.  Fever.  Brain tumor.  Age related decreased brain function (dementia).  Heightened emotional states like rage or terror.  Mental illness in which the person loses the ability to determine what is real and what is not (hallucinations).  Infections such as a urinary tract infection (UTI).  Toxic effects from alcohol, drugs, or prescription medicines.  Dehydration and an imbalance of salts in the body (electrolytes).  Lack of sleep.  Low blood sugar (diabetes).  Low levels of oxygen from conditions such as chronic lung disorders.  Drug interactions or other medicine side effects.  Nutritional deficiencies, especially niacin, thiamine, vitamin C, or vitamin B.  Sudden drop in body temperature (hypothermia).  Change in routine, such as when traveling or hospitalized. SIGNS AND SYMPTOMS  People often describe their thinking as cloudy or unclear when they are confused. Confusion can also include feeling disoriented. That means you are unaware of where or who you are. You may also not know what the date or time is. If confused, you may also have difficulty paying attention, remembering, and making decisions. Some people also act aggressively when they are confused.  DIAGNOSIS  The medical evaluation of confusion may include:  Blood and urine tests.  X-rays.  Brain and nervous system tests.  Analyzing your brain waves (electroencephalogram or EEG).  Magnetic resonance imaging (MRI) of your head.  Computed tomography (CT) scan of your  head.  Mental status tests in which your health care provider may ask many questions. Some of these questions may seem silly or strange, but they are a very important test to help diagnose and treat confusion. TREATMENT  An admission to the hospital may not be needed, but a person with confusion should not be left alone. Stay with a family member or friend until the confusion clears. Avoid alcohol, pain relievers, or sedative drugs until you have fully recovered. Do not drive until directed by your health care provider. HOME CARE INSTRUCTIONS  What family and friends can do:  To find out if someone is confused, ask the person to state his or her name, age, and the date. If the person is unsure or answers incorrectly, he or she is confused.  Always introduce yourself, no matter how well the person knows you.  Often remind the person of his or her location.  Place a calendar and clock near the confused person.  Help the person with his or her medicines. You may want to use a pill box, an alarm as a reminder, or give the person each dose as prescribed.  Talk about current events and plans for the day.  Try to keep the environment calm, quiet, and peaceful.  Make sure the person keeps follow-up visits with his or her health care provider. PREVENTION  Ways to prevent confusion:  Avoid alcohol.  Eat a balanced diet.  Get enough sleep.  Take medicine only as directed by your health care provider.  Do not become isolated. Spend time with other people and make plans for your days.  Keep careful watch on your blood sugar levels if you  are diabetic. SEEK IMMEDIATE MEDICAL CARE IF:   You develop severe headaches, repeated vomiting, seizures, blackouts, or slurred speech.  There is increasing confusion, weakness, numbness, restlessness, or personality changes.  You develop a loss of balance, have marked dizziness, feel uncoordinated, or fall.  You have delusions, hallucinations, or  develop severe anxiety.  Your family members think you need to be rechecked. Document Released: 10/23/2004 Document Revised: 01/30/2014 Document Reviewed: 10/21/2013 [facility] Patient Information 2015 [place], [place]. This information is not intended to replace advice given to you by your health care provider. Make sure you discuss any questions you have with your health care provider.

## 2015-06-15 NOTE — ED Provider Notes (Signed)
Patient's vital signs are stable in the emergency department. She continues to be at her mental baseline. She has a normal neurologic exam. Workup was negative for any acute findings. Will discharge back to assisted living facility. Advised to follow-up with her primary physician.  Julianne Rice, MD 06/15/15 [phone]

## 2015-06-25 ENCOUNTER — Inpatient Hospital Stay: Payer: Medicare Other | Admitting: Internal Medicine

## 2015-06-27 ENCOUNTER — Telehealth: Payer: Self-pay | Admitting: Hematology and Oncology

## 2015-06-27 NOTE — Telephone Encounter (Signed)
pt dtr called to sched f/u...done....ok and aware of d.t

## 2015-06-28 ENCOUNTER — Telehealth: Payer: Self-pay | Admitting: Internal Medicine

## 2015-06-28 NOTE — Telephone Encounter (Signed)
Received orders today for patient.  Needs to know if Dr. Alain Marion is going to be attending provider for orders.

## 2015-06-29 NOTE — Telephone Encounter (Signed)
Attending provider for what? Pls specify Thx

## 2015-06-29 NOTE — Telephone Encounter (Signed)
Correction:  They are wanting to know if Dr. Alain Marion will be attending provider?

## 2015-06-29 NOTE — Telephone Encounter (Signed)
What orders? Thx

## 2015-07-02 ENCOUNTER — Ambulatory Visit (INDEPENDENT_AMBULATORY_CARE_PROVIDER_SITE_OTHER): Payer: Medicare Other | Admitting: Internal Medicine

## 2015-07-02 ENCOUNTER — Ambulatory Visit (HOSPITAL_BASED_OUTPATIENT_CLINIC_OR_DEPARTMENT_OTHER): Payer: Medicare Other | Admitting: Hematology and Oncology

## 2015-07-02 ENCOUNTER — Encounter: Payer: Self-pay | Admitting: Hematology and Oncology

## 2015-07-02 ENCOUNTER — Ambulatory Visit (HOSPITAL_BASED_OUTPATIENT_CLINIC_OR_DEPARTMENT_OTHER): Payer: Medicare Other

## 2015-07-02 ENCOUNTER — Encounter: Payer: Self-pay | Admitting: Internal Medicine

## 2015-07-02 ENCOUNTER — Telehealth: Payer: Self-pay | Admitting: *Deleted

## 2015-07-02 VITALS — BP 132/60 | HR 71 | Temp 98.9°F | Wt 95.0 lb

## 2015-07-02 VITALS — BP 130/60 | HR 82 | Temp 97.4°F | Resp 18 | Ht 61.0 in | Wt 95.3 lb

## 2015-07-02 DIAGNOSIS — C9002 Multiple myeloma in relapse: Secondary | ICD-10-CM

## 2015-07-02 DIAGNOSIS — Z789 Other specified health status: Secondary | ICD-10-CM

## 2015-07-02 DIAGNOSIS — I10 Essential (primary) hypertension: Secondary | ICD-10-CM | POA: Diagnosis not present

## 2015-07-02 DIAGNOSIS — E43 Unspecified severe protein-calorie malnutrition: Secondary | ICD-10-CM

## 2015-07-02 DIAGNOSIS — E46 Unspecified protein-calorie malnutrition: Secondary | ICD-10-CM

## 2015-07-02 DIAGNOSIS — I878 Other specified disorders of veins: Secondary | ICD-10-CM

## 2015-07-02 DIAGNOSIS — R531 Weakness: Secondary | ICD-10-CM

## 2015-07-02 DIAGNOSIS — D630 Anemia in neoplastic disease: Secondary | ICD-10-CM

## 2015-07-02 DIAGNOSIS — R634 Abnormal weight loss: Secondary | ICD-10-CM

## 2015-07-02 DIAGNOSIS — F062 Psychotic disorder with delusions due to known physiological condition: Secondary | ICD-10-CM

## 2015-07-02 DIAGNOSIS — F0391 Unspecified dementia with behavioral disturbance: Secondary | ICD-10-CM | POA: Diagnosis not present

## 2015-07-02 DIAGNOSIS — F4323 Adjustment disorder with mixed anxiety and depressed mood: Secondary | ICD-10-CM | POA: Diagnosis not present

## 2015-07-02 DIAGNOSIS — R279 Unspecified lack of coordination: Secondary | ICD-10-CM | POA: Diagnosis not present

## 2015-07-02 DIAGNOSIS — R627 Adult failure to thrive: Secondary | ICD-10-CM | POA: Diagnosis not present

## 2015-07-02 DIAGNOSIS — Z515 Encounter for palliative care: Secondary | ICD-10-CM

## 2015-07-02 DIAGNOSIS — F03918 Unspecified dementia, unspecified severity, with other behavioral disturbance: Secondary | ICD-10-CM

## 2015-07-02 LAB — CBC & DIFF AND RETIC
BASO%: 0.2 % (ref 0.0–2.0)
BASOS ABS: 0 10*3/uL (ref 0.0–0.1)
EOS%: 0.2 % (ref 0.0–7.0)
Eosinophils Absolute: 0 10*3/uL (ref 0.0–0.5)
HEMATOCRIT: 31 % — AB (ref 34.8–46.6)
HGB: 9.6 g/dL — ABNORMAL LOW (ref 11.6–15.9)
Immature Retic Fract: 16.9 % — ABNORMAL HIGH (ref 1.60–10.00)
LYMPH#: 0.7 10*3/uL — AB (ref 0.9–3.3)
LYMPH%: 14.4 % (ref 14.0–49.7)
MCH: 29.9 pg (ref 25.1–34.0)
MCHC: 31 g/dL — AB (ref 31.5–36.0)
MCV: 96.6 fL (ref 79.5–101.0)
MONO#: 0.5 10*3/uL (ref 0.1–0.9)
MONO%: 10.3 % (ref 0.0–14.0)
NEUT#: 3.6 10*3/uL (ref 1.5–6.5)
NEUT%: 74.9 % (ref 38.4–76.8)
NRBC: 1 % — AB (ref 0–0)
Platelets: 375 10*3/uL (ref 145–400)
RBC: 3.21 10*6/uL — ABNORMAL LOW (ref 3.70–5.45)
RDW: 17.8 % — AB (ref 11.2–14.5)
RETIC %: 1.97 % (ref 0.70–2.10)
RETIC CT ABS: 63.24 10*3/uL (ref 33.70–90.70)
WBC: 4.9 10*3/uL (ref 3.9–10.3)

## 2015-07-02 LAB — COMPREHENSIVE METABOLIC PANEL (CC13)
ALT: 28 U/L (ref 0–55)
ANION GAP: 4 meq/L (ref 3–11)
AST: 21 U/L (ref 5–34)
Albumin: 2.9 g/dL — ABNORMAL LOW (ref 3.5–5.0)
Alkaline Phosphatase: 64 U/L (ref 40–150)
BUN: 13.5 mg/dL (ref 7.0–26.0)
CO2: 31 meq/L — AB (ref 22–29)
Calcium: 10.2 mg/dL (ref 8.4–10.4)
Chloride: 103 mEq/L (ref 98–109)
Creatinine: 0.7 mg/dL (ref 0.6–1.1)
EGFR: 89 mL/min/{1.73_m2} — AB (ref >90)
GLUCOSE: 103 mg/dL (ref 70–140)
POTASSIUM: 4.2 meq/L (ref 3.5–5.1)
SODIUM: 139 meq/L (ref 136–145)
Total Bilirubin: 0.69 mg/dL (ref 0.20–1.20)
Total Protein: 9 g/dL — ABNORMAL HIGH (ref 6.4–8.3)

## 2015-07-02 MED ORDER — LIDOCAINE-PRILOCAINE 2.5-2.5 % EX CREA: TOPICAL_CREAM | CUTANEOUS | Status: DC

## 2015-07-02 MED ORDER — ACYCLOVIR 400 MG PO TABS: 400.0000 mg | ORAL_TABLET | Freq: Every day | ORAL | Status: DC

## 2015-07-02 NOTE — Assessment & Plan Note (Signed)
The patient has poor performance status and progressive weight loss. The daughter felt that this is likely related to poor oral intake related to poor choice of food at the skilled facility. She is confident that the patient will improve as she was recently discharged home. We discussed poor prognosis and treatment options. I would not be willing to prescribe chemotherapy. At most, I am willing to consider giving her immunotherapy. We discussed various treatment options and goals of care. Her and POA, Ebony Hail once the patient to continue to receive chemotherapy. The decision was made based on publication at the [facility].  Targeting CD38 with Daratumumab Monotherapy in Multiple Myeloma Henk M. Nikki Dom, M.D., Ph.D., Lisabeth Pick, M.D., Edyth Gunnels. Serafina Mitchell, M.D., Carolanne Grumbling, M.D., Ph.D., Lelon Frohlich, M.D., Ph.D., Towanda Malkin, M.D., Ph.D., Monique C. Linus Galas, M.D., Ph.D., Harden Mo, M.D., Ph.D., Rudolpho Sevin, M.D., Lucienne Capers, M.D., Stefano Gaul W.C.J. Mady Haagensen, M.D., Ph.D., Lynetta Mare, M.D., Ph.D., Ella Jubilee, M.D., Ph.D., Darol Destine. Rozann Lesches, Ph.D., Mayer Masker, Ph.D., Levy Pupa, Ph.D., Pearletha Forge, M.[place]., Candie Mile, M.D., Godfrey Pick, M.D., Hassan Rowan, M.D., Ph.D., and Loyal Gambler. Marvel Plan, M.D. Alison Stalling J Med [phone] 24, 2015  The chemotherapy consists of daratumumab, a humanized monoclonal antibody against CD 38.  According to the publication, there were 2 complete response is noted in the group of 32 patients treated.. Infusion reactions are common side effects.  Some of the short term side-effects included, though not limited to, risk of fatigue, weight loss, tumor lysis syndrome, risk of allergic reactions, pancytopenia, life-threatening infections, need for transfusions of blood products, admission to hospital for various reasons, and risks of death.   The patient is aware that the response rates discussed earlier is not guaranteed.    After a long  discussion, patient made an informed decision to proceed with the prescribed plan of care.

## 2015-07-02 NOTE — Assessment & Plan Note (Signed)
Doing fair Wt Readings from Last 3 Encounters:  07/02/15 95 lb (43.092 kg)  07/02/15 95 lb 4.8 oz (43.228 kg)  06/14/15 105 lb (47.628 kg)

## 2015-07-02 NOTE — Assessment & Plan Note (Signed)
This is likely anemia of chronic disease. The patient denies recent history of bleeding such as epistaxis, hematuria or hematochezia. She is asymptomatic from the anemia. We will observe for now.  She does not require transfusion now.   

## 2015-07-02 NOTE — Telephone Encounter (Signed)
Per staff message and POF I have scheduled appts. Advised scheduler of appts and no available on 10/12, moved to 10/11. MD visit on 10/19 to late, waiting for MD response.   JMW

## 2015-07-02 NOTE — Assessment & Plan Note (Signed)
We discussed goals of care. I made her daughter aware that her prognosis is poor due to other comorbidities and the patient appears to have poor tolerance to treatment. We reviewed her advanced directives. Her daughter is MPOA and she desired or the patient to receive full treatment and full CODE STATUS.

## 2015-07-02 NOTE — Progress Notes (Signed)
Pre visit review using our clinic review tool, if applicable. No additional management support is needed unless otherwise documented below in the visit note. 

## 2015-07-02 NOTE — Progress Notes (Signed)
Subjective:  Patient ID: Victoria [name], female    DOB: 1931-12-21  Age: 81 y.o. MRN: [id number]  CC: No chief complaint on file.   HPI Ottilia L Pickering presents for a f/up post-NH stay at [facility] and post- hosp stay prior:   Admit date: 05/29/2015 Discharge date: 05/31/2015  Time spent: 45 minutes  Recommendations for Outpatient Follow-up:  -Will be discharged to SNF today. -To follow up with oncologist, Dr. Alvy Bimler, as scheduled.  Discharge Diagnoses:  Principal Problem:  Hypercalcemia Active Problems:  Anemia  Essential hypertension  Dementia with behavioral disturbance  Multiple myeloma in relapse  Weakness  Bilateral lower extremity edema   Discharge Condition: Stable and improved  Filed Weights   05/29/15 1600  Weight: 46.222 kg (101 lb 14.4 oz)    History of present illness:  As per Dr. Marily Memos 8/30: Patient states that since discharge from the hospital on 05/21/2015 she had been in her normal state of health. States that she went for evaluation and lab work at her outpatient oncology office and was called with subsequent result showing hypercalcemia was told to come to [facility] hospital. For admission. Since discharge patient has been a nursing home patient at [facility]. Patient states that the only adverse event she's had since her discharge was when she was given a dose of Seroquel at night for which she felt very out of it for an extended period of time anfd has not had any subsequent doses of medicine. Condition is constant. Nothing makes the condition better or worse. Nothing is being given to reverse the condition.  Hospital Course:   Hypercalcemia -Resolved upon DC with IVF and pamidronate.  Anemia of Chronic Disease -Secondary to multiple myeloma. -No indications for transfusion this admission.  HTN -Stable.  Dementia -At baseline. -Is very sensitive to new medications; apparently at prior SNF had significant somnolence with both  seroquel and namenda.   Multiple Myeloma, in relapse -Will f/u with oncology as an OP as scheduled. -Has had poor tolerance to treatment with multiple complications. -Had recurrence of hypercalcemia once treatment was placed on hold. -Per onc recommendations, will continue decadron upon DC.  Generalized Weakness -To SNF per PT recommendations.  Procedures:  None  Consultations:  None  Discharge Instructions  Discharge Instructions    Increase activity slowly  Complete by: As directed                 Outpatient Prescriptions Prior to Visit  Medication Sig Dispense Refill  . acyclovir (ZOVIRAX) 400 MG tablet Take 1 tablet (400 mg total) by mouth daily. 60 tablet 11  . Amino Acids-Protein Hydrolys (FEEDING SUPPLEMENT, PRO-STAT SUGAR FREE 64,) LIQD Take 30 mLs by mouth 2 (two) times daily.    Marland Kitchen buPROPion (WELLBUTRIN) 75 MG tablet Take 75 mg by mouth 2 (two) times daily.    . cholecalciferol (VITAMIN D) 1000 UNITS tablet Take 2,000 Units by mouth daily.    Marland Kitchen dexamethasone (DECADRON) 4 MG tablet Take 10 mg by mouth daily.    . divalproex (DEPAKOTE SPRINKLE) 125 MG capsule Take 125 mg by mouth at bedtime.    . lidocaine-prilocaine (EMLA) cream Apply to affected area once 30 g 3  . LORazepam (ATIVAN) 1 MG tablet Take 1 tablet (1 mg total) by mouth every 8 (eight) hours as needed for anxiety. 60 tablet 3  . Multiple Vitamins-Minerals (MULTIVITAMIN & MINERAL PO) Take 1 tablet by mouth daily.    . promethazine (PHENERGAN) 25 MG tablet Take 0.5 tablets (  12.5 mg total) by mouth every 6 (six) hours as needed for nausea. 30 tablet 3  . traMADol (ULTRAM) 50 MG tablet Take 1 tablet (50 mg total) by mouth every 6 (six) hours as needed. 60 tablet 0   Facility-Administered Medications Prior to Visit  Medication Dose Route Frequency Provider Last Rate Last Dose  . 0.9 %  sodium chloride infusion   Intravenous Once Heath Lark, MD        ROS Review of Systems    Constitutional: Negative for chills, activity change, appetite change, fatigue and unexpected weight change.  HENT: Negative for congestion, mouth sores and sinus pressure.   Eyes: Negative for visual disturbance.  Respiratory: Negative for cough and chest tightness.   Gastrointestinal: Negative for nausea and abdominal pain.  Genitourinary: Negative for frequency, difficulty urinating and vaginal pain.  Musculoskeletal: Negative for back pain and gait problem.  Skin: Negative for pallor and rash.  Neurological: Negative for dizziness, tremors, weakness, numbness and headaches.  Psychiatric/Behavioral: Positive for decreased concentration. Negative for confusion and sleep disturbance. The patient is nervous/anxious.     Objective:  BP 132/60 mmHg  Pulse 71  Temp(Src) 98.9 F (37.2 C) (Oral)  Wt 95 lb (43.092 kg)  SpO2 97%  BP Readings from Last 3 Encounters:  07/02/15 132/60  07/02/15 130/60  06/15/15 165/68    Wt Readings from Last 3 Encounters:  07/02/15 95 lb (43.092 kg)  07/02/15 95 lb 4.8 oz (43.228 kg)  06/14/15 105 lb (47.628 kg)    Physical Exam  Constitutional: She appears well-developed. No distress.  HENT:  Head: Normocephalic.  Right Ear: External ear normal.  Left Ear: External ear normal.  Nose: Nose normal.  Mouth/Throat: Oropharynx is clear and moist.  Eyes: Conjunctivae are normal. Pupils are equal, round, and reactive to light. Right eye exhibits no discharge. Left eye exhibits no discharge.  Neck: Normal range of motion. Neck supple. No JVD present. No tracheal deviation present. No thyromegaly present.  Cardiovascular: Normal rate, regular rhythm and normal heart sounds.   Pulmonary/Chest: No stridor. No respiratory distress. She has no wheezes.  Abdominal: Soft. Bowel sounds are normal. She exhibits no distension and no mass. There is no tenderness. There is no rebound and no guarding.  Musculoskeletal: She exhibits no edema or tenderness.   Lymphadenopathy:    She has no cervical adenopathy.  Neurological: She displays normal reflexes. No cranial nerve deficit. She exhibits normal muscle tone. Coordination abnormal.  Skin: No rash noted. No erythema.  Psychiatric: Judgment and thought content normal.    Lab Results  Component Value Date   WBC 4.9 07/02/2015   HGB 9.6* 07/02/2015   HCT 31.0* 07/02/2015   PLT 375 07/02/2015   GLUCOSE 103 07/02/2015   CHOL 158 03/09/2007   TRIG 37 03/09/2007   HDL 58.8 03/09/2007   LDLCALC 92 03/09/2007   ALT 28 07/02/2015   AST 21 07/02/2015   NA 139 07/02/2015   K 4.2 07/02/2015   CL 105 06/14/2015   CREATININE 0.7 07/02/2015   BUN 13.5 07/02/2015   CO2 31* 07/02/2015   TSH 0.762 12/30/2014   INR 1.19 02/21/2011   HGBA1C 5.3 03/09/2007    Dg Chest 2 View  06/14/2015   CLINICAL DATA:  81 year old female with confusion.  EXAM: CHEST  2 VIEW  COMPARISON:  05/15/2015  FINDINGS: Very low lung volumes persist. There is unchanged vascular congestion. Increased bibasilar atelectasis. Cardiomediastinal contours are unchanged, partially obscured. No confluent airspace disease, large pleural  effusion or pneumothorax. Diffuse bony under mineralization.  IMPRESSION: Very low lung volumes with increasing bibasilar atelectasis. Unchanged vascular congestion.   Electronically Signed   By: Jeb Levering M.D.   On: 06/14/2015 23:58   Ct Head Wo Contrast  06/15/2015   CLINICAL DATA:  81 year old female with confusion  EXAM: CT HEAD WITHOUT CONTRAST  TECHNIQUE: Contiguous axial images were obtained from the base of the skull through the vertex without intravenous contrast.  COMPARISON:  CT dated 05/14/2015  FINDINGS: The ventricles are dilated and the sulci are prominent compatible with age-related atrophy. Periventricular and deep white matter hypodensities represent chronic microvascular ischemic changes. There is no intracranial hemorrhage. No mass effect or midline shift identified.  The  visualized paranasal sinuses and mastoid air cells are well aerated. Multiple calvarial lytic lesion again noted compatible with known history of multiple myeloma. The calvarium is otherwise intact.  IMPRESSION: No acute intracranial pathology.  Age-related atrophy and chronic microvascular ischemic disease.  If symptoms persist and there are no contraindications, MRI may provide better evaluation if clinically indicated.   Electronically Signed   By: Anner Crete M.D.   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 Handy maintain her promethazine, Multiple Vitamins-Minerals (MULTIVITAMIN & MINERAL PO), cholecalciferol, dexamethasone, LORazepam, traMADol, divalproex, buPROPion, feeding supplement (PRO-STAT SUGAR FREE 64), acyclovir, and lidocaine-prilocaine.  No orders of the defined types were placed in this encounter.     Follow-up: Return in about 3 months (around 10/02/2015) for a follow-up visit.  Walker Kehr, MD

## 2015-07-02 NOTE — Assessment & Plan Note (Signed)
Lorazepam prn; Depakote, Wellbutrin  Potential benefits of a long term benzodiazepines  use as well as potential risks  and complications were explained to the patient and dtr and were aknowledged.  Will consult Dr Adele Schilder

## 2015-07-02 NOTE — Assessment & Plan Note (Signed)
She has poor venous access. I recommend port placement before we proceed with treatment.

## 2015-07-02 NOTE — Assessment & Plan Note (Signed)
OT/PT at home 

## 2015-07-02 NOTE — Assessment & Plan Note (Signed)
She has failure to thrive and lost some weight. She claimed it was because of poor choice of food at the skilled facility. I recommend she continue nutritional supplement as tolerated.

## 2015-07-02 NOTE — Progress Notes (Signed)
[place] OFFICE PROGRESS NOTE  Patient Care Team: Heath Lark, MD as PCP - General (Hematology and Oncology) Lafayette Dragon, MD as Consulting Physician (Gastroenterology) Leonie Man, MD as Consulting Physician (Cardiology) Heath Lark, MD as Consulting Physician (Hematology and Oncology)  SUMMARY OF ONCOLOGIC HISTORY:   Multiple myeloma in relapse (Uncertain)   12/30/2014 Initial Diagnosis Multiple myeloma in relapse   02/05/2015 Bone Marrow Biopsy BM biopsy was non-diagnostic. FISH was positive for loss of 17p and gain of chromosome 11   02/06/2015 -  Chemotherapy She was started on Ninlaro, dexamethasone and Revlimid.   02/16/2015 Adverse Reaction Revlimid was placed on hold due to uncontrolled diarrhea.    INTERVAL HISTORY: Please see below for problem oriented charting. The patient was recently discharged home from skilled facility. Her appetite remained poor and she have chronic fatigue. She denies bone pain. Denies nausea. According to his daughter, she has been having intermittent diarrhea. She has lost a lot of weight since I saw her.  REVIEW OF SYSTEMS:   Constitutional: Denies fevers, chills  Eyes: Denies blurriness of vision Ears, nose, mouth, throat, and face: Denies mucositis or sore throat Respiratory: Denies cough, dyspnea or wheezes Cardiovascular: Denies palpitation, chest discomfort or lower extremity swelling Skin: Denies abnormal skin rashes Lymphatics: Denies new lymphadenopathy or easy bruising Neurological:Denies numbness, tingling or new weaknesses Behavioral/Psych: Mood is stable, no new changes  All other systems were reviewed with the patient and are negative.  I have reviewed the past medical history, past surgical history, social history and family history with the patient and they are unchanged from previous note.  ALLERGIES:  is allergic to amoxicillin; aspirin; atenolol; donepezil; doxycycline; nortriptyline; amiodarone; latex; and  zoloft.  MEDICATIONS:  Current Outpatient Prescriptions  Medication Sig Dispense Refill  . cholecalciferol (VITAMIN D) 1000 UNITS tablet Take 2,000 Units by mouth daily.    Marland Kitchen dexamethasone (DECADRON) 4 MG tablet Take 10 mg by mouth daily.    . divalproex (DEPAKOTE SPRINKLE) 125 MG capsule Take 125 mg by mouth at bedtime.    Marland Kitchen LORazepam (ATIVAN) 1 MG tablet Take 1 tablet (1 mg total) by mouth every 8 (eight) hours as needed for anxiety. 60 tablet 3  . Multiple Vitamins-Minerals (MULTIVITAMIN & MINERAL PO) Take 1 tablet by mouth daily.    . promethazine (PHENERGAN) 25 MG tablet Take 0.5 tablets (12.5 mg total) by mouth every 6 (six) hours as needed for nausea. 30 tablet 3  . traMADol (ULTRAM) 50 MG tablet Take 1 tablet (50 mg total) by mouth every 6 (six) hours as needed. 60 tablet 0  . acyclovir (ZOVIRAX) 400 MG tablet Take 1 tablet (400 mg total) by mouth daily. 60 tablet 11  . Amino Acids-Protein Hydrolys (FEEDING SUPPLEMENT, PRO-STAT SUGAR FREE 64,) LIQD Take 30 mLs by mouth 2 (two) times daily.    Marland Kitchen buPROPion (WELLBUTRIN) 75 MG tablet Take 75 mg by mouth 2 (two) times daily.    Marland Kitchen lidocaine-prilocaine (EMLA) cream Apply to affected area once 30 g 3   No current facility-administered medications for this visit.   Facility-Administered Medications Ordered in Other Visits  Medication Dose Route Frequency Provider Last Rate Last Dose  . 0.9 %  sodium chloride infusion   Intravenous Once Heath Lark, MD        PHYSICAL EXAMINATION: ECOG PERFORMANCE STATUS: 2 - Symptomatic, <50% confined to bed  Filed Vitals:   07/02/15 1335  BP: 130/60  Pulse: 82  Temp: 97.4 F (36.3 C)  Resp: 18   Filed Weights   07/02/15 1335  Weight: 95 lb 4.8 oz (43.228 kg)    GENERAL:alert, no distress and comfortable SKIN: skin color, texture, turgor are normal, no rashes or significant lesions EYES: normal, Conjunctiva are pink and non-injected, sclera clear OROPHARYNX:no exudate, no erythema and lips,  buccal mucosa, and tongue normal  NECK: supple, thyroid normal size, non-tender, without nodularity LYMPH:  no palpable lymphadenopathy in the cervical, axillary or inguinal LUNGS: clear to auscultation and percussion with normal breathing effort HEART: regular rate & rhythm and no murmurs and no lower extremity edema ABDOMEN:abdomen soft, non-tender and normal bowel sounds Musculoskeletal:no cyanosis of digits and no clubbing  NEURO: alert & oriented x 3 with fluent speech, no focal motor/sensory deficits  LABORATORY DATA:  I have reviewed the data as listed    Component Value Date/Time   NA 139 07/02/2015 1426   NA 139 06/14/2015 2248   K 4.2 07/02/2015 1426   K 3.8 06/14/2015 2248   CL 105 06/14/2015 2248   CL 103 03/11/2013 1503   CO2 31* 07/02/2015 1426   CO2 28 06/14/2015 2248   GLUCOSE 103 07/02/2015 1426   GLUCOSE 172* 06/14/2015 2248   GLUCOSE 91 03/11/2013 1503   BUN 13.5 07/02/2015 1426   BUN 37* 06/14/2015 2248   CREATININE 0.7 07/02/2015 1426   CREATININE 0.79 06/14/2015 2248   CALCIUM 10.2 07/02/2015 1426   CALCIUM 9.4 06/14/2015 2248   CALCIUM 11.4* 12/30/2014 0835   PROT 9.0* 07/02/2015 1426   PROT 8.3* 06/14/2015 2248   ALBUMIN 2.9* 07/02/2015 1426   ALBUMIN 2.6* 06/14/2015 2248   AST 21 07/02/2015 1426   AST 25 06/14/2015 2248   ALT 28 07/02/2015 1426   ALT 26 06/14/2015 2248   ALKPHOS 64 07/02/2015 1426   ALKPHOS 49 06/14/2015 2248   BILITOT 0.69 07/02/2015 1426   BILITOT 0.7 06/14/2015 2248   GFRNONAA >60 06/14/2015 2248   GFRAA >60 06/14/2015 2248    No results found for: SPEP, UPEP  Lab Results  Component Value Date   WBC 4.9 07/02/2015   NEUTROABS 3.6 07/02/2015   HGB 9.6* 07/02/2015   HCT 31.0* 07/02/2015   MCV 96.6 07/02/2015   PLT 375 07/02/2015      Chemistry      Component Value Date/Time   NA 139 07/02/2015 1426   NA 139 06/14/2015 2248   K 4.2 07/02/2015 1426   K 3.8 06/14/2015 2248   CL 105 06/14/2015 2248   CL 103  03/11/2013 1503   CO2 31* 07/02/2015 1426   CO2 28 06/14/2015 2248   BUN 13.5 07/02/2015 1426   BUN 37* 06/14/2015 2248   CREATININE 0.7 07/02/2015 1426   CREATININE 0.79 06/14/2015 2248      Component Value Date/Time   CALCIUM 10.2 07/02/2015 1426   CALCIUM 9.4 06/14/2015 2248   CALCIUM 11.4* 12/30/2014 0835   ALKPHOS 64 07/02/2015 1426   ALKPHOS 49 06/14/2015 2248   AST 21 07/02/2015 1426   AST 25 06/14/2015 2248   ALT 28 07/02/2015 1426   ALT 26 06/14/2015 2248   BILITOT 0.69 07/02/2015 1426   BILITOT 0.7 06/14/2015 2248      ASSESSMENT & PLAN:  Multiple myeloma in relapse The patient has poor performance status and progressive weight loss. The daughter felt that this is likely related to poor oral intake related to poor choice of food at the skilled facility. She is confident that the patient will improve as  she was recently discharged home. We discussed poor prognosis and treatment options. I would not be willing to prescribe chemotherapy. At most, I am willing to consider giving her immunotherapy. We discussed various treatment options and goals of care. Her and POA, Revonda Standard once the patient to continue to receive chemotherapy. The decision was made based on publication at the [facility].  Targeting CD38 with Daratumumab Monotherapy in Multiple Myeloma Henk M. Stark Klein, M.D., Ph.D., Raynald Kemp, M.D., Bard Herbert. Bronwen Betters, M.D., Carlynn Purl, M.D., Ph.D., Claudina Lick, M.D., Ph.D., Gasper Lloyd, M.D., Ph.D., Monique C. Mila Palmer, M.D., Ph.D., Clearnce Hasten, M.D., Ph.D., Heather Roberts, M.D., Georgiann Cocker, M.D., Lottie Dawson W.C.J. Darnelle Maffucci, M.D., Ph.D., Campbell Stall, M.D., Ph.D., Beatris Ship, M.D., Ph.D., Waldron Session. Glendell Docker, Ph.D., Milinda Cave, Ph.D., Octavio Graves, Ph.D., Rudy Jew, M.[place]., Antionette Poles, M.D., Marvis Repress, M.D., Otilio Saber, M.D., Ph.D., and Ivan Anchors. Senaida Ores, M.D. [facility] Mis J Med 2015; 373:1207-1219September 24, 2015  The chemotherapy consists of daratumumab,  a humanized monoclonal antibody against CD 38.  According to the publication, there were 2 complete response is noted in the group of 32 patients treated.. Infusion reactions are common side effects.  Some of the short term side-effects included, though not limited to, risk of fatigue, weight loss, tumor lysis syndrome, risk of allergic reactions, pancytopenia, life-threatening infections, need for transfusions of blood products, admission to hospital for various reasons, and risks of death.   The patient is aware that the response rates discussed earlier is not guaranteed.    After a long discussion, patient made an informed decision to proceed with the prescribed plan of care.      Anemia in neoplastic disease This is likely anemia of chronic disease. The patient denies recent history of bleeding such as epistaxis, hematuria or hematochezia. She is asymptomatic from the anemia. We will observe for now.  She does not require transfusion now.      Protein calorie malnutrition (HCC) She has failure to thrive and lost some weight. She claimed it was because of poor choice of food at the skilled facility. I recommend she continue nutritional supplement as tolerated.  Palliative care encounter We discussed goals of care. I made her daughter aware that her prognosis is poor due to other comorbidities and the patient appears to have poor tolerance to treatment. We reviewed her advanced directives. Her daughter is MPOA and she desired or the patient to receive full treatment and full CODE STATUS.  Poor venous access She has poor venous access. I recommend port placement before we proceed with treatment.   Orders Placed This Encounter  Procedures  . IR Fluoro Guide CV Line Right    Indicate type of CVC ordering    Standing Status: Future     Number of Occurrences:      Standing Expiration Date: 08/31/2016    Order Specific Question:  Reason for exam:    Answer:  need port for chemo     Order Specific Question:  Preferred Imaging Location?    Answer:  [facility]  . CBC with Differential    Standing Status: Standing     Number of Occurrences: 20     Standing Expiration Date: 08/05/2016  . Comprehensive metabolic panel    Standing Status: Standing     Number of Occurrences: 20     Standing Expiration Date: 08/05/2016  . SPEP & IFE with QIG    Standing Status: Future     Number of Occurrences: 1  Standing Expiration Date: 08/05/2016  . Kappa/lambda light chains    Standing Status: Future     Number of Occurrences: 1     Standing Expiration Date: 08/05/2016  . PHYSICIAN COMMUNICATION ORDER    Type and Screen patients prior to starting daratumumab. Administration of daratumumab results in a positive Indirect Antiglobulin Test (Coombs test) for up to 6 months after the last infusion. Notify blood transfusion centers of this interference with serological testing and inform blood banks that a patient has recevied daratumumab  . Direct antiglobulin test (not at [facility])    Standing Status: Future     Number of Occurrences: 1     Standing Expiration Date: 08/05/2016   All questions were answered. The patient knows to call the clinic with any problems, questions or concerns. No barriers to learning was detected. I spent 30 minutes counseling the patient face to face. The total time spent in the appointment was 40 minutes and more than 50% was on counseling and review of test results     [facility], [place], MD 07/02/2015 4:23 PM

## 2015-07-02 NOTE — Assessment & Plan Note (Signed)
Doing well 

## 2015-07-02 NOTE — Assessment & Plan Note (Signed)
RN/PT/OT at home

## 2015-07-03 ENCOUNTER — Telehealth: Payer: Self-pay | Admitting: *Deleted

## 2015-07-03 MED ORDER — DEXAMETHASONE 4 MG PO TABS: ORAL_TABLET | ORAL | Status: DC

## 2015-07-03 NOTE — Telephone Encounter (Signed)
They are wanting to know if you are going to sign off on home health orders for patient

## 2015-07-03 NOTE — Telephone Encounter (Signed)
Called to notify [facility].  But they were not able to contact patient so they have not admitted patient. Did state if our office wanted to reach out to patient to see if patient still needed services we can call them back.

## 2015-07-03 NOTE — Telephone Encounter (Signed)
-----   Message from Heath Lark, MD sent at 07/03/2015  9:16 AM EDT ----- Regarding: start dex Pls call daughter Ebony Hail Her total protein is very high, suggestive of high disease burden I recommend dexamethasone 4 mg daily Po with food preferably in the morning until we start Rx. Please call in dex 4 mg daily PO, disp 30 tabs, 1 refill

## 2015-07-03 NOTE — Telephone Encounter (Signed)
Left message for Ebony Hail to call us back to discuss lab results and that we will be sending in prescription for steroid. Please call us to discuss.

## 2015-07-03 NOTE — Telephone Encounter (Signed)
Yes. Thx.

## 2015-07-03 NOTE — Telephone Encounter (Signed)
Notified Ebony Hail that patient needs to start dexamethasone 4mg  daily.

## 2015-07-04 LAB — SPEP & IFE WITH QIG
ABNORMAL PROTEIN BAND1: 2.8 g/dL
ALPHA-1-GLOBULIN: 0.4 g/dL — AB (ref 0.2–0.3)
ALPHA-2-GLOBULIN: 0.9 g/dL (ref 0.5–0.9)
Albumin ELP: 3.4 g/dL — ABNORMAL LOW (ref 3.8–4.8)
Beta 2: 0.3 g/dL (ref 0.2–0.5)
Beta Globulin: 0.4 g/dL (ref 0.4–0.6)
GAMMA GLOBULIN: 3 g/dL — AB (ref 0.8–1.7)
IgA: 26 mg/dL — ABNORMAL LOW (ref 69–380)
IgG (Immunoglobin G), Serum: 3730 mg/dL — ABNORMAL HIGH (ref 690–1700)
IgM, Serum: <5 mg/dL — ABNORMAL LOW (ref 52–322)
Total Protein, Serum Electrophoresis: 8.4 g/dL — ABNORMAL HIGH (ref 6.1–8.1)

## 2015-07-04 LAB — KAPPA/LAMBDA LIGHT CHAINS
KAPPA LAMBDA RATIO: 0 — AB (ref 0.26–1.65)
Kappa free light chain: 1.1 mg/dL (ref 0.33–1.94)
Lambda Free Lght Chn: 248 mg/dL — ABNORMAL HIGH (ref 0.57–2.63)

## 2015-07-04 LAB — DIRECT ANTIGLOBULIN RFX ANTI-C3/IGG: DAT, Polyspecific: NEGATIVE

## 2015-07-05 ENCOUNTER — Telehealth: Payer: Self-pay | Admitting: Internal Medicine

## 2015-07-05 ENCOUNTER — Telehealth: Payer: Self-pay | Admitting: *Deleted

## 2015-07-05 NOTE — Telephone Encounter (Signed)
Patient daughter called to advise that she was under the impression that referral for gentiva was for PT, Ot, and nursing. Referral was only placed for PT. Please clarify

## 2015-07-05 NOTE — Telephone Encounter (Signed)
I have moved treatment to 10/20 due to late MD appt

## 2015-07-06 ENCOUNTER — Other Ambulatory Visit: Payer: Self-pay | Admitting: Radiology

## 2015-07-06 ENCOUNTER — Telehealth: Payer: Self-pay | Admitting: Internal Medicine

## 2015-07-06 ENCOUNTER — Telehealth: Payer: Self-pay | Admitting: Hematology and Oncology

## 2015-07-06 NOTE — Telephone Encounter (Signed)
pts daughter Ebony Hail called requesting prescription for LORazepam (ATIVAN) 1 MG tablet [[id number] I told her about the prescription that was written by another doctor on 9/1 and she states she never got that prescription.  Pharmacy is [facility] on [address]

## 2015-07-06 NOTE — Telephone Encounter (Signed)
Follow up - KJ out. Per email response from inf scheduler tx added and 10/19 tx moved to 10/20 due to f/u visit too late. Message to inf scheduler to move 10/20 tx back to 10/19 as NG was to see patient in inf area and dtr has already planned out schedule. Spoke with dtr re next appointment for 10/11 and per dtr she cannot do 10/11 - tx was to be 10/12. No availability to add for 10/12. Per dtr she would like to delay til 10/19. Not able to have dtr leave message for nurse. Message for nurse to contact pt dtr Allision at number in [facility] re delaying tx til 10/19.

## 2015-07-09 ENCOUNTER — Ambulatory Visit (HOSPITAL_COMMUNITY): Admission: RE | Admit: 2015-07-09 | Payer: Medicare Other | Source: Ambulatory Visit

## 2015-07-09 ENCOUNTER — Other Ambulatory Visit: Payer: Self-pay | Admitting: Internal Medicine

## 2015-07-09 ENCOUNTER — Inpatient Hospital Stay (HOSPITAL_COMMUNITY): Admission: RE | Admit: 2015-07-09 | Payer: Self-pay | Source: Ambulatory Visit

## 2015-07-09 NOTE — Telephone Encounter (Signed)
OK to fill this prescription with additional refills x5 Thank you!  

## 2015-07-10 ENCOUNTER — Encounter: Payer: Self-pay | Admitting: Nutrition

## 2015-07-10 ENCOUNTER — Ambulatory Visit: Payer: Self-pay

## 2015-07-10 MED ORDER — LORAZEPAM 1 MG PO TABS: 1.0000 mg | ORAL_TABLET | Freq: Three times a day (TID) | ORAL | Status: DC | PRN

## 2015-07-10 NOTE — Telephone Encounter (Signed)
Called [facility] had to leave on pharmacist vm, also notified daughter med call to pharmacy...Victoria [name]

## 2015-07-12 ENCOUNTER — Inpatient Hospital Stay (HOSPITAL_COMMUNITY): Admission: RE | Admit: 2015-07-12 | Payer: Self-pay | Source: Ambulatory Visit

## 2015-07-12 ENCOUNTER — Other Ambulatory Visit: Payer: Self-pay | Admitting: Radiology

## 2015-07-12 ENCOUNTER — Ambulatory Visit (HOSPITAL_COMMUNITY): Payer: Self-pay

## 2015-07-13 ENCOUNTER — Other Ambulatory Visit: Payer: Self-pay | Admitting: Radiology

## 2015-07-16 ENCOUNTER — Other Ambulatory Visit: Payer: Self-pay | Admitting: Hematology and Oncology

## 2015-07-16 ENCOUNTER — Telehealth: Payer: Self-pay | Admitting: Internal Medicine

## 2015-07-16 ENCOUNTER — Ambulatory Visit (HOSPITAL_COMMUNITY)
Admission: RE | Admit: 2015-07-16 | Discharge: 2015-07-16 | Disposition: A | Discharge status: Home / Self Care | Payer: Medicare Other | Source: Ambulatory Visit | Attending: Hematology and Oncology | Admitting: Hematology and Oncology

## 2015-07-16 ENCOUNTER — Encounter (HOSPITAL_COMMUNITY): Payer: Self-pay

## 2015-07-16 ENCOUNTER — Other Ambulatory Visit: Payer: Self-pay | Admitting: Physician Assistant

## 2015-07-16 DIAGNOSIS — F419 Anxiety disorder, unspecified: Secondary | ICD-10-CM | POA: Diagnosis not present

## 2015-07-16 DIAGNOSIS — Z79899 Other long term (current) drug therapy: Secondary | ICD-10-CM | POA: Insufficient documentation

## 2015-07-16 DIAGNOSIS — C9002 Multiple myeloma in relapse: Secondary | ICD-10-CM | POA: Diagnosis not present

## 2015-07-16 DIAGNOSIS — I10 Essential (primary) hypertension: Secondary | ICD-10-CM | POA: Diagnosis not present

## 2015-07-16 LAB — CBC WITH DIFFERENTIAL/PLATELET
BASOS PCT: 0 %
Basophils Absolute: 0 10*3/uL (ref 0.0–0.1)
Eosinophils Absolute: 0 10*3/uL (ref 0.0–0.7)
Eosinophils Relative: 0 %
HEMATOCRIT: 30.4 % — AB (ref 36.0–46.0)
HEMOGLOBIN: 9.5 g/dL — AB (ref 12.0–15.0)
Lymphocytes Relative: 21 %
Lymphs Abs: 0.7 10*3/uL (ref 0.7–4.0)
MCH: 30.6 pg (ref 26.0–34.0)
MCHC: 31.3 g/dL (ref 30.0–36.0)
MCV: 98.1 fL (ref 78.0–100.0)
MONOS PCT: 11 %
Monocytes Absolute: 0.4 10*3/uL (ref 0.1–1.0)
NEUTROS ABS: 2.3 10*3/uL (ref 1.7–7.7)
NEUTROS PCT: 68 %
Platelets: 172 10*3/uL (ref 150–400)
RBC: 3.1 MIL/uL — ABNORMAL LOW (ref 3.87–5.11)
RDW: 17.5 % — ABNORMAL HIGH (ref 11.5–15.5)
WBC: 3.4 10*3/uL — ABNORMAL LOW (ref 4.0–10.5)

## 2015-07-16 LAB — PROTIME-INR
INR: 1.08 (ref 0.00–1.49)
Prothrombin Time: 14.2 seconds (ref 11.6–15.2)

## 2015-07-16 MED ORDER — HEPARIN SOD (PORK) LOCK FLUSH 100 UNIT/ML IV SOLN
INTRAVENOUS | Status: AC
  Filled 2015-07-16: qty 5

## 2015-07-16 MED ORDER — SODIUM CHLORIDE 0.9 % IV SOLN
INTRAVENOUS | Status: DC
  Administered 2015-07-16: 14:00:00 via INTRAVENOUS

## 2015-07-16 MED ORDER — LIDOCAINE HCL 1 % IJ SOLN
INTRAMUSCULAR | Status: AC
  Filled 2015-07-16: qty 20

## 2015-07-16 MED ORDER — FENTANYL CITRATE (PF) 100 MCG/2ML IJ SOLN
INTRAMUSCULAR | Status: AC | PRN
  Administered 2015-07-16: 25 ug via INTRAVENOUS

## 2015-07-16 MED ORDER — LIDOCAINE-EPINEPHRINE 2 %-1:100000 IJ SOLN
INTRAMUSCULAR | Status: AC
  Filled 2015-07-16: qty 1

## 2015-07-16 MED ORDER — CEFAZOLIN SODIUM-DEXTROSE 2-3 GM-% IV SOLR
INTRAVENOUS | Status: AC
  Filled 2015-07-16: qty 50

## 2015-07-16 MED ORDER — MIDAZOLAM HCL 2 MG/2ML IJ SOLN
INTRAMUSCULAR | Status: AC
  Filled 2015-07-16: qty 6

## 2015-07-16 MED ORDER — FENTANYL CITRATE (PF) 100 MCG/2ML IJ SOLN
INTRAMUSCULAR | Status: AC
  Filled 2015-07-16: qty 4

## 2015-07-16 MED ORDER — MIDAZOLAM HCL 2 MG/2ML IJ SOLN
INTRAMUSCULAR | Status: AC | PRN
  Administered 2015-07-16: 0.5 mg via INTRAVENOUS

## 2015-07-16 MED ORDER — VANCOMYCIN HCL IN DEXTROSE 1-5 GM/200ML-% IV SOLN
1000.0000 mg | Freq: Once | INTRAVENOUS | Status: AC
  Administered 2015-07-16: 1000 mg via INTRAVENOUS
  Filled 2015-07-16: qty 200

## 2015-07-16 MED ORDER — HEPARIN SOD (PORK) LOCK FLUSH 100 UNIT/ML IV SOLN
INTRAVENOUS | Status: AC | PRN
  Administered 2015-07-16: 500 [IU]

## 2015-07-16 NOTE — Procedures (Signed)
Successful RT IJ SLIM POWER PORT TIP SVC/RA NO COMP Ready for use Full report in PACS

## 2015-07-16 NOTE — Telephone Encounter (Signed)
Requesting verbal for PT for 2x week for 6 weeks and order for nursing OT and ST eval.

## 2015-07-16 NOTE — H&P (Signed)
Chief Complaint: Patient was seen in consultation today for Port-A-Cath placement  Referring Physician(s): Gorsuch,Ni  History of Present Illness: Victoria [name] is a 81 y.o. female with history of relapsing multiple myeloma and poor venous access who presents today for Port-A-Cath placement for chemotherapy/immunotherapy . Past Medical History  Diagnosis Date  . Hypertension   . Insomnia   . Anxiety   . Anemia   . Multiple myeloma     In remission  . Diverticulosis 04/2001  . Perforation bowel ([place])   . Ringing in ears     Wears hearing aides to drown out   . Rectal bleeding 02/03/2014  . Anemia in chronic illness 01/24/2015    Past Surgical History  Procedure Laterality Date  . Sigmoid resection / rectopexy  01/2011    perforation/stoma  . Colonoscopy    . Partial hysterectomy    . Cesarean section    . Colostomy takedown  06/27/11  . Cholecystectomy  2012    laparoscopic    Allergies: Amoxicillin; Aspirin; Atenolol; Donepezil; Doxycycline; Nortriptyline; Amiodarone; Latex; and Zoloft  Medications: Prior to Admission medications   Medication Sig Start Date End Date Taking? Authorizing Provider  acyclovir (ZOVIRAX) 400 MG tablet Take 1 tablet (400 mg total) by mouth daily. 07/02/15   Heath Lark, MD  cholecalciferol (VITAMIN D) 1000 UNITS tablet Take 2,000 Units by mouth daily.    Historical Provider, MD  dexamethasone (DECADRON) 4 MG tablet Take with food in the morning until chemotherapy starts 07/03/15   Heath Lark, MD  lidocaine-prilocaine (EMLA) cream Apply to affected area once 07/02/15   Heath Lark, MD  loratadine (CLARITIN) 10 MG tablet Take 10 mg by mouth daily.    Historical Provider, MD  LORazepam (ATIVAN) 1 MG tablet Take 1 tablet (1 mg total) by mouth every 8 (eight) hours as needed for anxiety. 07/10/15   Aleksei Plotnikov V, MD  Multiple Vitamins-Minerals (MULTIVITAMIN & MINERAL PO) Take 1 tablet by mouth daily.    Historical Provider, MD  promethazine  (PHENERGAN) 25 MG tablet Take 0.5 tablets (12.5 mg total) by mouth every 6 (six) hours as needed for nausea. 02/05/15   Heath Lark, MD  traMADol (ULTRAM) 50 MG tablet Take 1 tablet (50 mg total) by mouth every 6 (six) hours as needed. 05/31/15   Erline Hau, MD     Family History  Problem Relation Age of Onset  . Hypertension Other   . Prostate cancer Father   . Colitis Neg Hx   . Esophageal cancer Neg Hx   . Stomach cancer Neg Hx     Social History   Social History  . Marital Status: Divorced    Spouse Name: N/A  . Number of Children: 2  . Years of Education: N/A   Social History Main Topics  . Smoking status: Never Smoker   . Smokeless tobacco: Never Used  . Alcohol Use: No  . Drug Use: No  . Sexual Activity: No   Other Topics Concern  . Not on file   Social History Narrative   Divorced.  Lives with daughter.  Normally ambulates without assistance.      Review of Systems  Constitutional: Positive for appetite change, fatigue and unexpected weight change. Negative for fever and chills.  Respiratory: Negative for cough and shortness of breath.   Cardiovascular: Negative for chest pain.  Gastrointestinal: Negative for nausea, vomiting, abdominal pain and blood in stool.  Genitourinary: Negative for dysuria and hematuria.  Musculoskeletal: Negative for back pain.  Neurological: Negative for headaches.    Vital Signs: BP 140/57, heart rate 57, temperature 97.8, respirations 20, O2 sats 94% room air  Physical Exam  Constitutional:  Thin BF in NAD  Cardiovascular: Normal rate and regular rhythm.   Pulmonary/Chest: Effort normal.  Slightly diminished breath sounds bases  Abdominal: Soft. Bowel sounds are normal. There is tenderness.  Musculoskeletal: She exhibits edema.  Neurological: She is alert.    Mallampati Score:     Imaging: No results found.  Labs:  CBC:  Recent Labs  05/29/15 1321 05/30/15 0559 06/14/15 2248 07/02/15 1426  WBC  4.5 3.2* 4.0 4.9  HGB 10.1* 8.1* 9.9* 9.6*  HCT 32.5* 26.2* 32.0* 31.0*  PLT 278 208 167 375    COAGS: No results for input(s): INR, APTT in the last 8760 hours.  BMP:  Recent Labs  05/29/15 2210 05/30/15 0559 05/31/15 0530 06/14/15 2248 07/02/15 1426  NA 141 140 139 139 139  K 3.4* 3.4* 3.4* 3.8 4.2  CL 110 110 112* 105  --   CO2 $Re'27 25 23 28 'BaQ$ 31*  GLUCOSE 102* 64* 69 172* 103  BUN 21* 19 15 37* 13.5  CALCIUM 8.9 8.9 8.7* 9.4 10.2  CREATININE 0.68 0.58 0.59 0.79 0.7  GFRNONAA >60 >60 >60 >60  --   GFRAA >60 >60 >60 >60  --     LIVER FUNCTION TESTS:  Recent Labs  05/30/15 0559 05/31/15 0530 06/14/15 2248 07/02/15 1426  BILITOT 0.7 0.7 0.7 0.69  AST $Re'26 19 25 21  'OsK$ ALT $R'23 20 26 28  'bj$ ALKPHOS 55 51 49 64  PROT 7.1 7.3 8.3* 9.0*  ALBUMIN 2.2* 2.1* 2.6* 2.9*    TUMOR MARKERS: No results for input(s): AFPTM, CEA, CA199, CHROMGRNA in the last 8760 hours.  Assessment and Plan: Victoria [name] is a 81 y.o. female with history of relapsing multiple myeloma and poor venous access who presents today for Port-A-Cath placement for chemotherapy/immunotherapy .Risks and benefits discussed with the patient/daughter including, but not limited to bleeding, infection, pneumothorax, or fibrin sheath development and need for additional procedures. All of the patient's questions were answered, patient is agreeable to proceed. Consent signed and in chart.     Thank you for this interesting consult.  I greatly enjoyed meeting [employer] and look forward to participating in their care.  A copy of this report was sent to the requesting provider on this date.  Signed: D. Rowe Robert 07/16/2015, 1:13 PM   I spent a total of 15 minutes in face to face in clinical consultation, greater than 50% of which was counseling/coordinating care for Port-A-Cath placement

## 2015-07-16 NOTE — Discharge Instructions (Signed)
=Moderate Conscious Sedation, Adult Sedation is the use of medicines to promote relaxation and relieve discomfort and anxiety. Moderate conscious sedation is a type of sedation. Under moderate conscious sedation you are less alert than normal but are still able to respond to instructions or stimulation. Moderate conscious sedation is used during short medical and dental procedures. It is milder than deep sedation or general anesthesia and allows you to return to your regular activities sooner. LET [facility] CARE PROVIDER KNOW ABOUT:   Any allergies you have.  All medicines you are taking, including vitamins, herbs, eye drops, creams, and over-the-counter medicines.  Use of steroids (by mouth or creams).  Previous problems you or members of your family have had with the use of anesthetics.  Any blood disorders you have.  Previous surgeries you have had.  Medical conditions you have.  Possibility of pregnancy, if this applies.  Use of cigarettes, alcohol, or illegal drugs. RISKS AND COMPLICATIONS Generally, this is a safe procedure. However, as with any procedure, problems can occur. Possible problems include:  Oversedation.  Trouble breathing on your own. You may need to have a breathing tube until you are awake and breathing on your own.  Allergic reaction to any of the medicines used for the procedure. BEFORE THE PROCEDURE  You may have blood tests done. These tests can help show how well your kidneys and liver are working. They can also show how well your blood clots.  A physical exam will be done.  Only take medicines as directed by your health care provider. You may need to stop taking medicines (such as blood thinners, aspirin, or nonsteroidal anti-inflammatory drugs) before the procedure.   Do not eat or drink at least 6 hours before the procedure or as directed by your health care provider.  Arrange for a responsible adult, family member, or friend to take you home  after the procedure. He or she should stay with you for at least 24 hours after the procedure, until the medicine has worn off. PROCEDURE   An intravenous (IV) catheter will be inserted into one of your veins. Medicine will be able to flow directly into your body through this catheter. You may be given medicine through this tube to help prevent pain and help you relax.  The medical or dental procedure will be done. AFTER THE PROCEDURE  You will stay in a recovery area until the medicine has worn off. Your blood pressure and pulse will be checked.   Depending on the procedure you had, you may be allowed to go home when you can tolerate liquids and your pain is under control.   This information is not intended to replace advice given to you by your health care provider. Make sure you discuss any questions you have with your health care provider.   Document Released: 06/10/2001 Document Revised: 10/06/2014 Document Reviewed: 05/23/2013 Elsevier Interactive Patient Education 2016 [employer]   Moderate Conscious Sedation, Adult, Care After Refer to this sheet in the next few weeks. These instructions provide you with information on caring for yourself after your procedure. Your health care provider may also give you more specific instructions. Your treatment has been planned according to current medical practices, but problems sometimes occur. Call your health care provider if you have any problems or questions after your procedure. WHAT TO EXPECT AFTER THE PROCEDURE  After your procedure:  You may feel sleepy, clumsy, and have poor balance for several hours.  Vomiting may occur if you eat  too soon after the procedure. HOME CARE INSTRUCTIONS  Do not participate in any activities where you could become injured for at least 24 hours. Do not:  Drive.  Swim.  Ride a bicycle.  Operate heavy machinery.  Cook.  Use power tools.  Climb ladders.  Work from a high place.  Do not  make important decisions or sign legal documents until you are improved.  If you vomit, drink water, juice, or soup when you can drink without vomiting. Make sure you have little or no nausea before eating solid foods.  Only take over-the-counter or prescription medicines for pain, discomfort, or fever as directed by your health care provider.  Make sure you and your family fully understand everything about the medicines given to you, including what side effects may occur.  You should not drink alcohol, take sleeping pills, or take medicines that cause drowsiness for at least 24 hours.  If you smoke, do not smoke without supervision.  If you are feeling better, you may resume normal activities 24 hours after you were sedated.  Keep all appointments with your health care provider. SEEK MEDICAL CARE IF:  Your skin is pale or bluish in color.  You continue to feel nauseous or vomit.  Your pain is getting worse and is not helped by medicine.  You have bleeding or swelling.  You are still sleepy or feeling clumsy after 24 hours. SEEK IMMEDIATE MEDICAL CARE IF:  You develop a rash.  You have difficulty breathing.  You develop any type of allergic problem.  You have a fever. MAKE SURE YOU:  Understand these instructions.  Will watch your condition.  Will get help right away if you are not doing well or get worse.   This information is not intended to replace advice given to you by your health care provider. Make sure you discuss any questions you have with your health care provider.   Document Released: 07/06/2013 Document Revised: 10/06/2014 Document Reviewed: 07/06/2013 Elsevier Interactive Patient Education 2016 [place] Insertion, Care After Refer to this sheet in the next few weeks. These instructions provide you with information on caring for yourself after your procedure. Your health care provider may also give you more specific instructions.  Your treatment has been planned according to current medical practices, but problems sometimes occur. Call your health care provider if you have any problems or questions after your procedure. WHAT TO EXPECT AFTER THE PROCEDURE After your procedure, it is typical to have the following:   Discomfort at the port insertion site. Ice packs to the area will help.  Bruising on the skin over the port. This will subside in 3-4 days. HOME CARE INSTRUCTIONS  After your port is placed, you will get a manufacturer's information card. The card has information about your port. Keep this card with you at all times.   Know what kind of port you have. There are many types of ports available.   Wear a medical alert bracelet in case of an emergency. This can help alert health care workers that you have a port.   The port can stay in for as long as your health care provider believes it is necessary.   A home health care nurse may give medicines and take care of the port.   You or a family member can get special training and directions for giving medicine and taking care of the port at home.  SEEK MEDICAL CARE IF:   Your port does  not flush or you are unable to get a blood return.   You have a fever or chills. SEEK IMMEDIATE MEDICAL CARE IF:  You have new fluid or pus coming from your incision.   You notice a bad smell coming from your incision site.   You have swelling, pain, or more redness at the incision or port site.   You have chest pain or shortness of breath.   This information is not intended to replace advice given to you by your health care provider. Make sure you discuss any questions you have with your health care provider.   Document Released: 07/06/2013 Document Revised: 09/20/2013 Document Reviewed: 07/06/2013 Elsevier Interactive Patient Education 2016 [employer].  May remove dressing and shower in 24 to 48 hours.  Keep sites clean and dry.  Implanted [facility]  Guide An implanted port is a type of central line that is placed under the skin. Central lines are used to provide IV access when treatment or nutrition needs to be given through a person's veins. Implanted ports are used for long-term IV access. An implanted port may be placed because:   You need IV medicine that would be irritating to the small veins in your hands or arms.   You need long-term IV medicines, such as antibiotics.   You need IV nutrition for a long period.   You need frequent blood draws for lab tests.   You need dialysis.  Implanted ports are usually placed in the chest area, but they can also be placed in the upper arm, the abdomen, or the leg. An implanted port has two main parts:   Reservoir. The reservoir is round and will appear as a small, raised area under your skin. The reservoir is the part where a needle is inserted to give medicines or draw blood.   Catheter. The catheter is a thin, flexible tube that extends from the reservoir. The catheter is placed into a large vein. Medicine that is inserted into the reservoir goes into the catheter and then into the vein.  HOW WILL I CARE FOR MY INCISION SITE? Do not get the incision site wet. Bathe or shower as directed by your health care provider.  HOW IS MY PORT ACCESSED? Special steps must be taken to access the port:   Before the port is accessed, a numbing cream can be placed on the skin. This helps numb the skin over the port site.   Your health care provider uses a sterile technique to access the port.  Your health care provider must put on a mask and sterile gloves.  The skin over your port is cleaned carefully with an antiseptic and allowed to dry.  The port is gently pinched between sterile gloves, and a needle is inserted into the port.  Only "non-coring" port needles should be used to access the port. Once the port is accessed, a blood return should be checked. This helps ensure that the port is  in the vein and is not clogged.   If your port needs to remain accessed for a constant infusion, a clear (transparent) bandage will be placed over the needle site. The bandage and needle will need to be changed every week, or as directed by your health care provider.   Keep the bandage covering the needle clean and dry. Do not get it wet. Follow your health care provider's instructions on how to take a shower or bath while the port is accessed.   If your port  does not need to stay accessed, no bandage is needed over the port.  WHAT IS FLUSHING? Flushing helps keep the port from getting clogged. Follow your health care provider's instructions on how and when to flush the port. Ports are usually flushed with saline solution or a medicine called heparin. The need for flushing will depend on how the port is used.   If the port is used for intermittent medicines or blood draws, the port will need to be flushed:   After medicines have been given.   After blood has been drawn.   As part of routine maintenance.   If a constant infusion is running, the port may not need to be flushed.  HOW LONG WILL MY PORT STAY IMPLANTED? The port can stay in for as long as your health care provider thinks it is needed. When it is time for the port to come out, surgery will be done to remove it. The procedure is similar to the one performed when the port was put in.  WHEN SHOULD I SEEK IMMEDIATE MEDICAL CARE? When you have an implanted port, you should seek immediate medical care if:   You notice a bad smell coming from the incision site.   You have swelling, redness, or drainage at the incision site.   You have more swelling or pain at the port site or the surrounding area.   You have a fever that is not controlled with medicine.   This information is not intended to replace advice given to you by your health care provider. Make sure you discuss any questions you have with your health care  provider.   Document Released: 09/15/2005 Document Revised: 07/06/2013 Document Reviewed: 05/23/2013 Elsevier Interactive Patient Education [employer].

## 2015-07-17 NOTE — Telephone Encounter (Signed)
MD ok order see encounter notes from 07/10/15. Notified Danielle with md response...Johny Chess

## 2015-07-18 ENCOUNTER — Ambulatory Visit (HOSPITAL_BASED_OUTPATIENT_CLINIC_OR_DEPARTMENT_OTHER): Payer: Medicare Other | Admitting: Hematology and Oncology

## 2015-07-18 ENCOUNTER — Encounter: Payer: Self-pay | Admitting: Hematology and Oncology

## 2015-07-18 ENCOUNTER — Other Ambulatory Visit: Payer: Medicare Other

## 2015-07-18 ENCOUNTER — Ambulatory Visit (HOSPITAL_BASED_OUTPATIENT_CLINIC_OR_DEPARTMENT_OTHER): Payer: Medicare Other

## 2015-07-18 ENCOUNTER — Ambulatory Visit: Payer: Medicare Other | Admitting: Nutrition

## 2015-07-18 VITALS — BP 137/64 | HR 59 | Temp 97.6°F | Resp 18

## 2015-07-18 VITALS — BP 152/70 | HR 85 | Temp 99.4°F | Resp 18

## 2015-07-18 DIAGNOSIS — Z5112 Encounter for antineoplastic immunotherapy: Secondary | ICD-10-CM

## 2015-07-18 DIAGNOSIS — E46 Unspecified protein-calorie malnutrition: Secondary | ICD-10-CM | POA: Diagnosis not present

## 2015-07-18 DIAGNOSIS — H109 Unspecified conjunctivitis: Secondary | ICD-10-CM

## 2015-07-18 DIAGNOSIS — D61818 Other pancytopenia: Secondary | ICD-10-CM | POA: Diagnosis not present

## 2015-07-18 DIAGNOSIS — C9002 Multiple myeloma in relapse: Secondary | ICD-10-CM

## 2015-07-18 MED ORDER — ACETAMINOPHEN 325 MG PO TABS
ORAL_TABLET | ORAL | Status: AC
  Filled 2015-07-18: qty 2

## 2015-07-18 MED ORDER — METHYLPREDNISOLONE SODIUM SUCC 125 MG IJ SOLR
125.0000 mg | Freq: Once | INTRAMUSCULAR | Status: AC
  Administered 2015-07-18: 125 mg via INTRAVENOUS

## 2015-07-18 MED ORDER — DIPHENHYDRAMINE HCL 25 MG PO CAPS
50.0000 mg | ORAL_CAPSULE | Freq: Once | ORAL | Status: AC
  Administered 2015-07-18: 50 mg via ORAL

## 2015-07-18 MED ORDER — SODIUM CHLORIDE 0.9 % IV SOLN
Freq: Once | INTRAVENOUS | Status: AC
  Administered 2015-07-18: 09:00:00 via INTRAVENOUS
  Filled 2015-07-18: qty 4

## 2015-07-18 MED ORDER — SODIUM CHLORIDE 0.9 % IJ SOLN
10.0000 mL | INTRAMUSCULAR | Status: DC | PRN
  Administered 2015-07-18: 10 mL
  Filled 2015-07-18: qty 10

## 2015-07-18 MED ORDER — HEPARIN SOD (PORK) LOCK FLUSH 100 UNIT/ML IV SOLN
500.0000 [IU] | Freq: Once | INTRAVENOUS | Status: AC | PRN
  Administered 2015-07-18: 500 [IU]
  Filled 2015-07-18: qty 5

## 2015-07-18 MED ORDER — METHYLPREDNISOLONE SODIUM SUCC 125 MG IJ SOLR
INTRAMUSCULAR | Status: AC
  Filled 2015-07-18: qty 2

## 2015-07-18 MED ORDER — DIPHENHYDRAMINE HCL 25 MG PO CAPS
ORAL_CAPSULE | ORAL | Status: AC
  Filled 2015-07-18: qty 2

## 2015-07-18 MED ORDER — TOBRAMYCIN 0.3 % OP SOLN: 1.0000 [drp] | OPHTHALMIC | Status: DC

## 2015-07-18 MED ORDER — ACETAMINOPHEN 325 MG PO TABS
650.0000 mg | ORAL_TABLET | Freq: Once | ORAL | Status: AC
  Administered 2015-07-18: 650 mg via ORAL

## 2015-07-18 MED ORDER — SODIUM CHLORIDE 0.9 % IV SOLN
Freq: Once | INTRAVENOUS | Status: AC
  Administered 2015-07-18: 09:00:00 via INTRAVENOUS

## 2015-07-18 MED ORDER — SODIUM CHLORIDE 0.9 % IV SOLN
16.0000 mg/kg | Freq: Once | INTRAVENOUS | Status: AC
  Administered 2015-07-18: 700 mg via INTRAVENOUS
  Filled 2015-07-18: qty 30

## 2015-07-18 NOTE — Assessment & Plan Note (Addendum)
We have discussed the reason of switching treatment. The risks, benefits, side effects of Daratumumab was discussed and she agreed to proceed She will proceed with cycle 1 of treatment today. I will see her next week to assess toxicity. If she tolerated this treatment well, we will consider adding Revlimid to her future treatment.

## 2015-07-18 NOTE — Progress Notes (Signed)
When accessing port to deaccess, this writer noticed blood around the port site. There appeared to be swelling around the port site, painful with pressure applied. Victoria Lesser NP notified. Per NP, port was deaccessed. There was some bloody drainage around port access site, but this stopped with pressure. Gauze pressure dressing applied. Pt and pt's daughter instructed to watch port site and to come to the ED if bleeding not controlled. Pt and pt's daughter expressed understanding.

## 2015-07-18 NOTE — Progress Notes (Signed)
Patient was identified to be at risk for malnutrition on the MST secondary to weight loss and poor appetite.  Patient is an 81 year old female diagnosed with multiple myeloma.  She is a patient of Dr. Alvy Bimler.  Past medical history includes hypertension, insomnia, anxiety, diverticulosis, perforated bowel and anemia.  Medications include vitamin D, Decadron, Ativan, multivitamin, and Phenergan.  Labs include albumin 2.9 on October 3.  Height: 61 inches. Weight: 95.3 pounds. Usual body weight: 120 pounds per patient. BMI: 18.02.  Patient appears to be somewhat confused; daughter is at her side during infusion for multiple myeloma. Patient does admit to poor appetite and limited oral intake. States she is very particular about what she will and will not eat. Patient has tried oral nutrition supplements but does not like them.  Severe Malnutrition related to inadequate oral intake as evidenced by BMI of 18.02 and depletion of body fat and muscle mass.  Nutrition diagnosis: Inadequate oral intake related to poor appetite as evidenced by 21% weight loss from usual body weight.  Intervention:  Educated patient on the importance of consuming increased calories and protein to improve quality-of-life. Tried samples of oral nutrition supplements however, patient did not enjoy them. Reviewed high protein, high calorie foods and encouraged patient to try to consume every 2 hours. Provided fact sheets recipes and contact information.  Teach back method was used.  Monitoring, evaluation, goals: Patient will try to increase oral intake to minimize further weight loss.  Next visit: Wednesday, November 2, during infusion.  **Disclaimer: This note was dictated with voice recognition software. Similar sounding words can inadvertently be transcribed and this note may contain transcription errors which may not have been corrected upon publication of note.**

## 2015-07-18 NOTE — Progress Notes (Signed)
Addendum: Patient had a new right upper chest Port-A-Cath placed this past Monday, 07/16/2015.    Patient received immunotherapy infusion via Port-A-Cath with no issues today.  However, [name] accessing the needle was noted that there was some small amount of blood around the Port-A-Cath needle insertion site.  After the needle was D accessed-it was noted that the needle insertion site was oozing a trace amount of dark blood.  Pressure was held; until all bleeding stopped.  Dressing was applied.  Advised both patient and her family that patient should go directly to the emergency department overnight.  If she develops any worsening issues with bleeding at her Port-A-Cath site whatsoever.

## 2015-07-18 NOTE — Assessment & Plan Note (Signed)
Anemia is likely due to anemia of chronic disease and bone marrow infiltration from multiple myeloma. She is not symptomatic. We will proceed with treatment without dose adjustment.

## 2015-07-18 NOTE — Progress Notes (Signed)
[place] [facility] OFFICE PROGRESS NOTE  Patient Care Team: Heath Lark, MD as PCP - General (Hematology and Oncology) Lafayette Dragon, MD as Consulting Physician (Gastroenterology) Leonie Man, MD as Consulting Physician (Cardiology) Heath Lark, MD as Consulting Physician (Hematology and Oncology)  SUMMARY OF ONCOLOGIC HISTORY:   Multiple myeloma in relapse ([place])   12/30/2014 Initial Diagnosis Multiple myeloma in relapse   02/05/2015 Bone Marrow Biopsy BM biopsy was non-diagnostic. FISH was positive for loss of 17p and gain of chromosome 11   02/06/2015 -  Chemotherapy She was started on Ninlaro, dexamethasone and Revlimid.   02/16/2015 Adverse Reaction Revlimid was placed on hold due to uncontrolled diarrhea.    INTERVAL HISTORY: Please see below for problem oriented charting. She returns for further follow-up. She is ready to begin chemotherapy. She has placement of port recently. Her appetite remained poor. She does not eat much or mobilize much. Denies recent change in bowel habits. She complained of left eye redness and conjunctivitis  REVIEW OF SYSTEMS:   Constitutional: Denies fevers, chills  Eyes: Denies blurriness of vision Ears, nose, mouth, throat, and face: Denies mucositis or sore throat Respiratory: Denies cough, dyspnea or wheezes Cardiovascular: Denies palpitation, chest discomfort or lower extremity swelling Gastrointestinal:  Denies nausea, heartburn or change in bowel habits Skin: Denies abnormal skin rashes Lymphatics: Denies new lymphadenopathy or easy bruising Neurological:Denies numbness, tingling or new weaknesses Behavioral/Psych: Mood is stable, no new changes  All other systems were reviewed with the patient and are negative.  I have reviewed the past medical history, past surgical history, social history and family history with the patient and they are unchanged from previous note.  ALLERGIES:  is allergic to amoxicillin; aspirin; atenolol;  donepezil; doxycycline; nortriptyline; amiodarone; latex; and zoloft.  MEDICATIONS:  Current Outpatient Prescriptions  Medication Sig Dispense Refill  . acyclovir (ZOVIRAX) 400 MG tablet Take 1 tablet (400 mg total) by mouth daily. 60 tablet 11  . cholecalciferol (VITAMIN D) 1000 UNITS tablet Take 2,000 Units by mouth daily.    Marland Kitchen dexamethasone (DECADRON) 4 MG tablet Take with food in the morning until chemotherapy starts 30 tablet 1  . lidocaine-prilocaine (EMLA) cream Apply to affected area once 30 g 3  . LORazepam (ATIVAN) 1 MG tablet Take 1 tablet (1 mg total) by mouth every 8 (eight) hours as needed for anxiety. 60 tablet 5  . Multiple Vitamins-Minerals (MULTIVITAMIN & MINERAL PO) Take 1 tablet by mouth daily.    . promethazine (PHENERGAN) 25 MG tablet Take 0.5 tablets (12.5 mg total) by mouth every 6 (six) hours as needed for nausea. 30 tablet 3  . tobramycin (TOBREX) 0.3 % ophthalmic solution Place 1 drop into the left eye every 4 (four) hours. 5 mL 0  . traMADol (ULTRAM) 50 MG tablet Take 1 tablet (50 mg total) by mouth every 6 (six) hours as needed. 60 tablet 0   No current facility-administered medications for this visit.   Facility-Administered Medications Ordered in Other Visits  Medication Dose Route Frequency Provider Last Rate Last Dose  . 0.9 %  sodium chloride infusion   Intravenous Once Heath Lark, MD      . daratumumab (DARZALEX) 700 mg in sodium chloride 0.9 % 965 mL (0.7 mg/mL) chemo infusion  16 mg/kg (Treatment Plan Actual) Intravenous Once Heath Lark, MD      . heparin lock flush 100 unit/mL  500 Units Intracatheter Once PRN Heath Lark, MD      . sodium chloride 0.9 %  injection 10 mL  10 mL Intracatheter PRN Heath Lark, MD        PHYSICAL EXAMINATION: ECOG PERFORMANCE STATUS: 2 - Symptomatic, <50% confined to bed  Filed Vitals:   07/18/15 0859  BP: 137/64  Pulse: 59  Temp: 97.6 F (36.4 C)  Resp: 18   There were no vitals filed for this  visit.  GENERAL:alert, no distress and comfortable. She looks thin and cachectic SKIN: skin color, texture, turgor are normal, no rashes or significant lesions EYES: Noted left eye conjunctivitis.  OROPHARYNX:no exudate, no erythema and lips, buccal mucosa, and tongue normal  NECK: supple, thyroid normal size, non-tender, without nodularity LYMPH:  no palpable lymphadenopathy in the cervical, axillary or inguinal LUNGS: clear to auscultation and percussion with normal breathing effort HEART: regular rate & rhythm and no murmurs and no lower extremity edema ABDOMEN:abdomen soft, non-tender and normal bowel sounds Musculoskeletal:no cyanosis of digits and no clubbing  NEURO: alert & oriented x 3 with fluent speech, no focal motor/sensory deficits  LABORATORY DATA:  I have reviewed the data as listed    Component Value Date/Time   NA 139 07/02/2015 1426   NA 139 06/14/2015 2248   K 4.2 07/02/2015 1426   K 3.8 06/14/2015 2248   CL 105 06/14/2015 2248   CL 103 03/11/2013 1503   CO2 31* 07/02/2015 1426   CO2 28 06/14/2015 2248   GLUCOSE 103 07/02/2015 1426   GLUCOSE 172* 06/14/2015 2248   GLUCOSE 91 03/11/2013 1503   BUN 13.5 07/02/2015 1426   BUN 37* 06/14/2015 2248   CREATININE 0.7 07/02/2015 1426   CREATININE 0.79 06/14/2015 2248   CALCIUM 10.2 07/02/2015 1426   CALCIUM 9.4 06/14/2015 2248   CALCIUM 11.4* 12/30/2014 0835   PROT 9.0* 07/02/2015 1426   PROT 8.3* 06/14/2015 2248   ALBUMIN 2.9* 07/02/2015 1426   ALBUMIN 2.6* 06/14/2015 2248   AST 21 07/02/2015 1426   AST 25 06/14/2015 2248   ALT 28 07/02/2015 1426   ALT 26 06/14/2015 2248   ALKPHOS 64 07/02/2015 1426   ALKPHOS 49 06/14/2015 2248   BILITOT 0.69 07/02/2015 1426   BILITOT 0.7 06/14/2015 2248   GFRNONAA >60 06/14/2015 2248   GFRAA >60 06/14/2015 2248    No results found for: SPEP, UPEP  Lab Results  Component Value Date   WBC 3.4* 07/16/2015   NEUTROABS 2.3 07/16/2015   HGB 9.5* 07/16/2015   HCT 30.4*  07/16/2015   MCV 98.1 07/16/2015   PLT 172 07/16/2015      Chemistry      Component Value Date/Time   NA 139 07/02/2015 1426   NA 139 06/14/2015 2248   K 4.2 07/02/2015 1426   K 3.8 06/14/2015 2248   CL 105 06/14/2015 2248   CL 103 03/11/2013 1503   CO2 31* 07/02/2015 1426   CO2 28 06/14/2015 2248   BUN 13.5 07/02/2015 1426   BUN 37* 06/14/2015 2248   CREATININE 0.7 07/02/2015 1426   CREATININE 0.79 06/14/2015 2248      Component Value Date/Time   CALCIUM 10.2 07/02/2015 1426   CALCIUM 9.4 06/14/2015 2248   CALCIUM 11.4* 12/30/2014 0835   ALKPHOS 64 07/02/2015 1426   ALKPHOS 49 06/14/2015 2248   AST 21 07/02/2015 1426   AST 25 06/14/2015 2248   ALT 28 07/02/2015 1426   ALT 26 06/14/2015 2248   BILITOT 0.69 07/02/2015 1426   BILITOT 0.7 06/14/2015 2248       RADIOGRAPHIC STUDIES: I have  personally reviewed the radiological images as listed and agreed with the findings in the report. Ir Fluoro Guide Cv Line Right  07/16/2015  CLINICAL DATA:  Multiple myeloma EXAM: RIGHT IJ SINGLE LUMEN POWER PORT CATHETER INSERTION Date:  10/17/201610/17/2016 3:47 pm Radiologist:  M. Daryll Brod, MD Guidance:  ULTRASOUND AND FLUOROSCOPIC FLUOROSCOPY TIME:  42 SECONDS, 3 MGY minutes MEDICATIONS AND MEDICAL HISTORY: 1 G VANCOMYCINadministered within 1 hour of the procedure.0.5 MG VERSED, 25 MCG FENTANYL ANESTHESIA/SEDATION: 32 minutes CONTRAST:  None. COMPLICATIONS: None immediate PROCEDURE: Informed consent was obtained from the patient following explanation of the procedure, risks, benefits and alternatives. The patient understands, agrees and consents for the procedure. All questions were addressed. A time out was performed. Maximal barrier sterile technique utilized including caps, mask, sterile gowns, sterile gloves, large sterile drape, hand hygiene, and 2% chlorhexidine scrub. Under sterile conditions and local anesthesia, right internal jugular micropuncture venous access was performed.  Access was performed with ultrasound. Images were obtained for documentation. A guide wire was inserted followed by a transitional dilator. This allowed insertion of a guide wire and catheter into the IVC. Measurements were obtained from the SVC / RA junction back to the right IJ venotomy site. In the right infraclavicular chest, a subcutaneous pocket was created over the second anterior rib. This was done under sterile conditions and local anesthesia. 1% lidocaine with epinephrine was utilized for this. A 2.5 cm incision was made in the skin. Blunt dissection was performed to create a subcutaneous pocket over the right pectoralis major muscle. The pocket was flushed with saline vigorously. There was adequate hemostasis. The port catheter was assembled and checked for leakage. The port catheter was secured in the pocket with two retention sutures. The tubing was tunneled subcutaneously to the right venotomy site and inserted into the SVC/RA junction through a valved peel-away sheath. Position was confirmed with fluoroscopy. Images were obtained for documentation. The patient tolerated the procedure well. No immediate complications. Incisions were closed in a two layer fashion with 4 - 0 Vicryl suture. Dermabond was applied to the skin. The port catheter was accessed, blood was aspirated followed by saline and heparin flushes. Needle was removed. A dry sterile dressing was applied. IMPRESSION: Ultrasound and fluoroscopically guided right internal jugular single lumen power port catheter insertion. Tip in the SVC/RA junction. Catheter ready for use. Electronically Signed   By: Jerilynn Mages.  Shick M.D.   On: 07/16/2015 16:36   Ir US Guide Vasc Access Right  07/16/2015  CLINICAL DATA:  Multiple myeloma EXAM: RIGHT IJ SINGLE LUMEN POWER PORT CATHETER INSERTION Date:  10/17/201610/17/2016 3:47 pm Radiologist:  M. Daryll Brod, MD Guidance:  ULTRASOUND AND FLUOROSCOPIC FLUOROSCOPY TIME:  42 SECONDS, 3 MGY minutes MEDICATIONS AND  MEDICAL HISTORY: 1 G VANCOMYCINadministered within 1 hour of the procedure.0.5 MG VERSED, 25 MCG FENTANYL ANESTHESIA/SEDATION: 32 minutes CONTRAST:  None. COMPLICATIONS: None immediate PROCEDURE: Informed consent was obtained from the patient following explanation of the procedure, risks, benefits and alternatives. The patient understands, agrees and consents for the procedure. All questions were addressed. A time out was performed. Maximal barrier sterile technique utilized including caps, mask, sterile gowns, sterile gloves, large sterile drape, hand hygiene, and 2% chlorhexidine scrub. Under sterile conditions and local anesthesia, right internal jugular micropuncture venous access was performed. Access was performed with ultrasound. Images were obtained for documentation. A guide wire was inserted followed by a transitional dilator. This allowed insertion of a guide wire and catheter into the IVC. Measurements were obtained from the  SVC / RA junction back to the right IJ venotomy site. In the right infraclavicular chest, a subcutaneous pocket was created over the second anterior rib. This was done under sterile conditions and local anesthesia. 1% lidocaine with epinephrine was utilized for this. A 2.5 cm incision was made in the skin. Blunt dissection was performed to create a subcutaneous pocket over the right pectoralis major muscle. The pocket was flushed with saline vigorously. There was adequate hemostasis. The port catheter was assembled and checked for leakage. The port catheter was secured in the pocket with two retention sutures. The tubing was tunneled subcutaneously to the right venotomy site and inserted into the SVC/RA junction through a valved peel-away sheath. Position was confirmed with fluoroscopy. Images were obtained for documentation. The patient tolerated the procedure well. No immediate complications. Incisions were closed in a two layer fashion with 4 - 0 Vicryl suture. Dermabond was  applied to the skin. The port catheter was accessed, blood was aspirated followed by saline and heparin flushes. Needle was removed. A dry sterile dressing was applied. IMPRESSION: Ultrasound and fluoroscopically guided right internal jugular single lumen power port catheter insertion. Tip in the SVC/RA junction. Catheter ready for use. Electronically Signed   By: Jerilynn Mages.  Shick M.D.   On: 07/16/2015 16:36     ASSESSMENT & PLAN:  Multiple myeloma in relapse We have discussed the reason of switching treatment. The risks, benefits, side effects of Daratumumab was discussed and she agreed to proceed She will proceed with cycle 1 of treatment today. I will see her next week to assess toxicity. If she tolerated this treatment well, we will consider adding Revlimid to her future treatment.  Pancytopenia ([place]) Anemia is likely due to anemia of chronic disease and bone marrow infiltration from multiple myeloma. She is not symptomatic. We will proceed with treatment without dose adjustment.  Protein calorie malnutrition ([place]) She has failure to thrive and lost some weight. I recommend she continue nutritional supplement as tolerated. She will be seen by a dietitian today.  Conjunctivitis of left eye She has recurrence of conjunctivitis on the left eye. I will prescribe her with some eyedrops.   No orders of the defined types were placed in this encounter.   All questions were answered. The patient knows to call the clinic with any problems, questions or concerns. No barriers to learning was detected. I spent 25 minutes counseling the patient face to face. The total time spent in the appointment was 30 minutes and more than 50% was on counseling and review of test results     [facility], [place], MD 07/18/2015 9:55 AM

## 2015-07-18 NOTE — Patient Instructions (Addendum)
[name] Discharge Instructions for Patients Receiving Chemotherapy  Today you received the following chemotherapy agents;  Daratumumab.   To help prevent nausea and vomiting after your treatment, we encourage you to take your nausea medication as tolerated.    If you develop nausea and vomiting that is not controlled by your nausea medication, call the clinic.   BELOW ARE SYMPTOMS THAT SHOULD BE REPORTED IMMEDIATELY:  *FEVER GREATER THAN 100.5 F  *CHILLS WITH OR WITHOUT FEVER  NAUSEA AND VOMITING THAT IS NOT CONTROLLED WITH YOUR NAUSEA MEDICATION  *UNUSUAL SHORTNESS OF BREATH  *UNUSUAL BRUISING OR BLEEDING  TENDERNESS IN MOUTH AND THROAT WITH OR WITHOUT PRESENCE OF ULCERS  *URINARY PROBLEMS  *BOWEL PROBLEMS  UNUSUAL RASH Items with * indicate a potential emergency and should be followed up as soon as possible.  Feel free to call the clinic you have any questions or concerns. The clinic phone number is [phone].  Please show the [facility] at check-in to the Emergency Department and triage nurse.  Daratumumab injection What is this medicine? DARATUMUMAB (dar a toom ue mab) is a monoclonal antibody. It is used to treat multiple myeloma. This medicine may be used for other purposes; ask your health care provider or pharmacist if you have questions. What should I tell my health care provider before I take this medicine? They need to know if you have any of these conditions: -infection (especially a virus infection such as chickenpox, cold sores, or herpes) -lung or breathing disease -pregnant or trying to get pregnant -breast-feeding -an unusual or allergic reaction to daratumumab, other medicines, foods, dyes, or preservatives How should I use this medicine? This medicine is for infusion into a vein. It is given by a health care professional in a hospital or clinic setting. Talk to your pediatrician regarding the use of this medicine in  children. Special care may be needed. Overdosage: If you think you have taken too much of this medicine contact a poison control center or emergency room at once. NOTE: This medicine is only for you. Do not share this medicine with others. What if I miss a dose? Keep appointments for follow-up doses as directed. It is important not to miss your dose. Call your doctor or health care professional if you are unable to keep an appointment. What may interact with this medicine? Interactions have not been studied. Give your health care provider a list of all the medicines, herbs, non-prescription drugs, or dietary supplements you use. Also tell them if you smoke, drink alcohol, or use illegal drugs. Some items may interact with your medicine. This list may not describe all possible interactions. Give your health care provider a list of all the medicines, herbs, non-prescription drugs, or dietary supplements you use. Also tell them if you smoke, drink alcohol, or use illegal drugs. Some items may interact with your medicine. What should I watch for while using this medicine? This drug may make you feel generally unwell. Report any side effects. Continue your course of treatment even though you feel ill unless your doctor tells you to stop. This medicine can cause serious allergic reactions. To reduce your risk you may need to take medicine before treatment with this medicine. Take your medicine as directed. This medicine can affect the results of blood tests to match your blood type. These changes can last for up to 6 months after the final dose. Your healthcare provider will do blood tests to match your blood type before you  start treatment. Tell all of your healthcare providers that you are being treated with this medicine before receiving a blood transfusion. This medicine can affect the results of some tests used to determine treatment response; extra tests may be needed to evaluate response. Do not  become pregnant while taking this medicine or for 3 months after stopping it. Women should inform their doctor if they wish to become pregnant or think they might be pregnant. There is a potential for serious side effects to an unborn child. Talk to your health care professional or pharmacist for more information. What side effects may I notice from receiving this medicine? Side effects that you should report to your doctor or health care professional as soon as possible: -allergic reactions like skin rash, itching or hives, swelling of the face, lips, or tongue -breathing problems -chills -cough -dizziness -feeling faint or lightheaded -headache -nausea, vomiting -shortness of breath Side effects that usually do not require medical attention (Report these to your doctor or health care professional if they continue or are bothersome.): -back pain -fever -joint pain -loss of appetite -tiredness This list may not describe all possible side effects. Call your doctor for medical advice about side effects. You may report side effects to FDA at 1-800-FDA-1088. Where should I keep my medicine? Keep out of the reach of children. This drug is given in a hospital or clinic and will not be stored at home. NOTE: This sheet is a summary. It may not cover all possible information. If you have questions about this medicine, talk to your doctor, pharmacist, or health care provider.    2016, [employer]. (2014-11-14 17:02:23)

## 2015-07-18 NOTE — Assessment & Plan Note (Signed)
She has recurrence of conjunctivitis on the left eye. I will prescribe her with some eyedrops.

## 2015-07-18 NOTE — Assessment & Plan Note (Signed)
She has failure to thrive and lost some weight. I recommend she continue nutritional supplement as tolerated. She will be seen by a dietitian today.

## 2015-07-19 ENCOUNTER — Ambulatory Visit: Payer: Self-pay

## 2015-07-20 ENCOUNTER — Telehealth: Payer: Self-pay | Admitting: Internal Medicine

## 2015-07-20 NOTE — Telephone Encounter (Signed)
Verbal order given to Carol. 

## 2015-07-20 NOTE — Telephone Encounter (Signed)
Got an order for skilled nursing.  Needs verbal to continue to see patient for 1 week to educate caregiver on behavior intervention for dementia.

## 2015-07-25 ENCOUNTER — Other Ambulatory Visit: Payer: Self-pay | Admitting: Hematology and Oncology

## 2015-07-25 ENCOUNTER — Ambulatory Visit (HOSPITAL_BASED_OUTPATIENT_CLINIC_OR_DEPARTMENT_OTHER): Payer: Medicare Other | Admitting: Hematology and Oncology

## 2015-07-25 ENCOUNTER — Other Ambulatory Visit (HOSPITAL_BASED_OUTPATIENT_CLINIC_OR_DEPARTMENT_OTHER): Payer: Medicare Other

## 2015-07-25 ENCOUNTER — Telehealth: Payer: Self-pay | Admitting: Internal Medicine

## 2015-07-25 ENCOUNTER — Ambulatory Visit (HOSPITAL_BASED_OUTPATIENT_CLINIC_OR_DEPARTMENT_OTHER): Payer: Medicare Other

## 2015-07-25 ENCOUNTER — Ambulatory Visit (INDEPENDENT_AMBULATORY_CARE_PROVIDER_SITE_OTHER): Payer: Medicare Other | Admitting: Psychiatry

## 2015-07-25 VITALS — BP 171/88 | HR 75 | Temp 98.6°F | Resp 18 | Wt 94.2 lb

## 2015-07-25 DIAGNOSIS — F4323 Adjustment disorder with mixed anxiety and depressed mood: Secondary | ICD-10-CM

## 2015-07-25 DIAGNOSIS — C9000 Multiple myeloma not having achieved remission: Secondary | ICD-10-CM | POA: Diagnosis not present

## 2015-07-25 DIAGNOSIS — C9002 Multiple myeloma in relapse: Secondary | ICD-10-CM | POA: Diagnosis not present

## 2015-07-25 DIAGNOSIS — Z5112 Encounter for antineoplastic immunotherapy: Secondary | ICD-10-CM | POA: Diagnosis not present

## 2015-07-25 DIAGNOSIS — E46 Unspecified protein-calorie malnutrition: Secondary | ICD-10-CM | POA: Diagnosis not present

## 2015-07-25 DIAGNOSIS — D61818 Other pancytopenia: Secondary | ICD-10-CM

## 2015-07-25 LAB — COMPREHENSIVE METABOLIC PANEL (CC13)
ALBUMIN: 2.5 g/dL — AB (ref 3.5–5.0)
ALK PHOS: 59 U/L (ref 40–150)
ALT: 22 U/L (ref 0–55)
ANION GAP: 5 meq/L (ref 3–11)
AST: 24 U/L (ref 5–34)
BUN: 15.5 mg/dL (ref 7.0–26.0)
CALCIUM: 11.1 mg/dL — AB (ref 8.4–10.4)
CO2: 29 mEq/L (ref 22–29)
CREATININE: 0.7 mg/dL (ref 0.6–1.1)
Chloride: 105 mEq/L (ref 98–109)
EGFR: >90 mL/min/{1.73_m2} (ref >90)
Glucose: 110 mg/dl (ref 70–140)
Potassium: 4 mEq/L (ref 3.5–5.1)
Sodium: 139 mEq/L (ref 136–145)
Total Bilirubin: 0.49 mg/dL (ref 0.20–1.20)
Total Protein: 8.9 g/dL — ABNORMAL HIGH (ref 6.4–8.3)

## 2015-07-25 LAB — CBC & DIFF AND RETIC
BASO%: 0.3 % (ref 0.0–2.0)
Basophils Absolute: 0 10*3/uL (ref 0.0–0.1)
EOS%: 0.3 % (ref 0.0–7.0)
Eosinophils Absolute: 0 10*3/uL (ref 0.0–0.5)
HCT: 29.1 % — ABNORMAL LOW (ref 34.8–46.6)
HGB: 9.1 g/dL — ABNORMAL LOW (ref 11.6–15.9)
IMMATURE RETIC FRACT: 15.5 % — AB (ref 1.60–10.00)
LYMPH#: 0.4 10*3/uL — AB (ref 0.9–3.3)
LYMPH%: 12.7 % — ABNORMAL LOW (ref 14.0–49.7)
MCH: 30.5 pg (ref 25.1–34.0)
MCHC: 31.3 g/dL — ABNORMAL LOW (ref 31.5–36.0)
MCV: 97.7 fL (ref 79.5–101.0)
MONO#: 0.4 10*3/uL (ref 0.1–0.9)
MONO%: 13.6 % (ref 0.0–14.0)
NEUT%: 73.1 % (ref 38.4–76.8)
NEUTROS ABS: 2.3 10*3/uL (ref 1.5–6.5)
PLATELETS: 254 10*3/uL (ref 145–400)
RBC: 2.98 10*6/uL — ABNORMAL LOW (ref 3.70–5.45)
RDW: 17 % — AB (ref 11.2–14.5)
RETIC %: 1.52 % (ref 0.70–2.10)
Retic Ct Abs: 45.3 10*3/uL (ref 33.70–90.70)
WBC: 3.1 10*3/uL — ABNORMAL LOW (ref 3.9–10.3)

## 2015-07-25 MED ORDER — ZOLEDRONIC ACID 4 MG/100ML IV SOLN
4.0000 mg | Freq: Once | INTRAVENOUS | Status: AC
  Administered 2015-07-25: 4 mg via INTRAVENOUS
  Filled 2015-07-25: qty 100

## 2015-07-25 MED ORDER — SODIUM CHLORIDE 0.9 % IV SOLN
Freq: Once | INTRAVENOUS | Status: AC
  Administered 2015-07-25: 09:00:00 via INTRAVENOUS

## 2015-07-25 MED ORDER — DARATUMUMAB CHEMO INJECTION 400 MG/20ML
16.0000 mg/kg | Freq: Once | INTRAVENOUS | Status: AC
  Administered 2015-07-25: 700 mg via INTRAVENOUS
  Filled 2015-07-25: qty 25

## 2015-07-25 MED ORDER — ACETAMINOPHEN 325 MG PO TABS
650.0000 mg | ORAL_TABLET | Freq: Once | ORAL | Status: AC
  Administered 2015-07-25: 650 mg via ORAL

## 2015-07-25 MED ORDER — DEXAMETHASONE 4 MG PO TABS: 4.0000 mg | ORAL_TABLET | Freq: Every day | ORAL | Status: DC

## 2015-07-25 MED ORDER — SODIUM CHLORIDE 0.9 % IJ SOLN
10.0000 mL | INTRAMUSCULAR | Status: DC | PRN
  Administered 2015-07-25: 10 mL
  Filled 2015-07-25: qty 10

## 2015-07-25 MED ORDER — DIPHENHYDRAMINE HCL 25 MG PO CAPS
50.0000 mg | ORAL_CAPSULE | Freq: Once | ORAL | Status: AC
  Administered 2015-07-25: 50 mg via ORAL

## 2015-07-25 MED ORDER — METHYLPREDNISOLONE SODIUM SUCC 125 MG IJ SOLR
INTRAMUSCULAR | Status: AC
  Filled 2015-07-25: qty 2

## 2015-07-25 MED ORDER — ACETAMINOPHEN 325 MG PO TABS
ORAL_TABLET | ORAL | Status: AC
  Filled 2015-07-25: qty 2

## 2015-07-25 MED ORDER — ONDANSETRON HCL 40 MG/20ML IJ SOLN
Freq: Once | INTRAMUSCULAR | Status: AC
  Administered 2015-07-25: 09:00:00 via INTRAVENOUS
  Filled 2015-07-25: qty 4

## 2015-07-25 MED ORDER — DIPHENHYDRAMINE HCL 25 MG PO CAPS
ORAL_CAPSULE | ORAL | Status: AC
  Filled 2015-07-25: qty 2

## 2015-07-25 MED ORDER — HEPARIN SOD (PORK) LOCK FLUSH 100 UNIT/ML IV SOLN
500.0000 [IU] | Freq: Once | INTRAVENOUS | Status: AC | PRN
  Administered 2015-07-25: 500 [IU]
  Filled 2015-07-25: qty 5

## 2015-07-25 MED ORDER — METHYLPREDNISOLONE SODIUM SUCC 125 MG IJ SOLR
125.0000 mg | Freq: Once | INTRAMUSCULAR | Status: AC
  Administered 2015-07-25: 125 mg via INTRAVENOUS

## 2015-07-25 NOTE — Progress Notes (Signed)
Instructed pt's daughter on elevated Calcium level and order from Dr. Alvy Bimler pt to take Dexamethasone 4 mg daily until further notice.   Rx escribed to [facility].  Pt also to get Zometa today after her treatment.  Daughter verbalized understanding.

## 2015-07-25 NOTE — Telephone Encounter (Signed)
Are you still her PCP? Please advise on below.

## 2015-07-25 NOTE — Telephone Encounter (Signed)
Daughter is requesting social services to educate on community resources.  Needs verbal

## 2015-07-25 NOTE — Patient Instructions (Addendum)
[facility] Discharge Instructions for Patients Receiving Chemotherapy  Today you received the following chemotherapy agents;  Daratumumab.   To help prevent nausea and vomiting after your treatment, we encourage you to take your nausea medication as tolerated. (Phenergan by mouth as needed)   If you develop nausea and vomiting that is not controlled by your nausea medication, call the clinic.   BELOW ARE SYMPTOMS THAT SHOULD BE REPORTED IMMEDIATELY:  *FEVER GREATER THAN 100.5 F  *CHILLS WITH OR WITHOUT FEVER  NAUSEA AND VOMITING THAT IS NOT CONTROLLED WITH YOUR NAUSEA MEDICATION  *UNUSUAL SHORTNESS OF BREATH  *UNUSUAL BRUISING OR BLEEDING  TENDERNESS IN MOUTH AND THROAT WITH OR WITHOUT PRESENCE OF ULCERS  *URINARY PROBLEMS  *BOWEL PROBLEMS  UNUSUAL RASH Items with * indicate a potential emergency and should be followed up as soon as possible.  Feel free to call the clinic you have any questions or concerns. The clinic phone number is [phone].  Please show the [place] at check-in to the Emergency Department and triage nurse.  Daratumumab injection What is this medicine? DARATUMUMAB (dar a toom ue mab) is a monoclonal antibody. It is used to treat multiple myeloma. This medicine may be used for other purposes; ask your health care provider or pharmacist if you have questions. What should I tell my health care provider before I take this medicine? They need to know if you have any of these conditions: -infection (especially a virus infection such as chickenpox, cold sores, or herpes) -lung or breathing disease -pregnant or trying to get pregnant -breast-feeding -an unusual or allergic reaction to daratumumab, other medicines, foods, dyes, or preservatives How should I use this medicine? This medicine is for infusion into a vein. It is given by a health care professional in a hospital or clinic setting. Talk to your pediatrician regarding the  use of this medicine in children. Special care may be needed. Overdosage: If you think you have taken too much of this medicine contact a poison control center or emergency room at once. NOTE: This medicine is only for you. Do not share this medicine with others. What if I miss a dose? Keep appointments for follow-up doses as directed. It is important not to miss your dose. Call your doctor or health care professional if you are unable to keep an appointment. What may interact with this medicine? Interactions have not been studied. Give your health care provider a list of all the medicines, herbs, non-prescription drugs, or dietary supplements you use. Also tell them if you smoke, drink alcohol, or use illegal drugs. Some items may interact with your medicine. This list may not describe all possible interactions. Give your health care provider a list of all the medicines, herbs, non-prescription drugs, or dietary supplements you use. Also tell them if you smoke, drink alcohol, or use illegal drugs. Some items may interact with your medicine. What should I watch for while using this medicine? This drug may make you feel generally unwell. Report any side effects. Continue your course of treatment even though you feel ill unless your doctor tells you to stop. This medicine can cause serious allergic reactions. To reduce your risk you may need to take medicine before treatment with this medicine. Take your medicine as directed. This medicine can affect the results of blood tests to match your blood type. These changes can last for up to 6 months after the final dose. Your healthcare provider will do blood tests to match your  blood type before you start treatment. Tell all of your healthcare providers that you are being treated with this medicine before receiving a blood transfusion. This medicine can affect the results of some tests used to determine treatment response; extra tests may be needed to evaluate  response. Do not become pregnant while taking this medicine or for 3 months after stopping it. Women should inform their doctor if they wish to become pregnant or think they might be pregnant. There is a potential for serious side effects to an unborn child. Talk to your health care professional or pharmacist for more information. What side effects may I notice from receiving this medicine? Side effects that you should report to your doctor or health care professional as soon as possible: -allergic reactions like skin rash, itching or hives, swelling of the face, lips, or tongue -breathing problems -chills -cough -dizziness -feeling faint or lightheaded -headache -nausea, vomiting -shortness of breath Side effects that usually do not require medical attention (Report these to your doctor or health care professional if they continue or are bothersome.): -back pain -fever -joint pain -loss of appetite -tiredness This list may not describe all possible side effects. Call your doctor for medical advice about side effects. You may report side effects to FDA at 1-800-FDA-1088. Where should I keep my medicine? Keep out of the reach of children. This drug is given in a hospital or clinic and will not be stored at home. NOTE: This sheet is a summary. It may not cover all possible information. If you have questions about this medicine, talk to your doctor, pharmacist, or health care provider.    2016, Elsevier/Gold Standard. (2014-11-14 17:02:23) Zoledronic Acid injection (Hypercalcemia, Oncology) What is this medicine? ZOLEDRONIC ACID (ZOE le dron ik AS id) lowers the amount of calcium loss from bone. It is used to treat too much calcium in your blood from cancer. It is also used to prevent complications of cancer that has spread to the bone. This medicine may be used for other purposes; ask your health care provider or pharmacist if you have questions. What should I tell my health care  provider before I take this medicine? They need to know if you have any of these conditions: -aspirin-sensitive asthma -cancer, especially if you are receiving medicines used to treat cancer -dental disease or wear dentures -infection -kidney disease -receiving corticosteroids like dexamethasone or prednisone -an unusual or allergic reaction to zoledronic acid, other medicines, foods, dyes, or preservatives -pregnant or trying to get pregnant -breast-feeding How should I use this medicine? This medicine is for infusion into a vein. It is given by a health care professional in a hospital or clinic setting. Talk to your pediatrician regarding the use of this medicine in children. Special care may be needed. Overdosage: If you think you have taken too much of this medicine contact a poison control center or emergency room at once. NOTE: This medicine is only for you. Do not share this medicine with others. What if I miss a dose? It is important not to miss your dose. Call your doctor or health care professional if you are unable to keep an appointment. What may interact with this medicine? -certain antibiotics given by injection -NSAIDs, medicines for pain and inflammation, like ibuprofen or naproxen -some diuretics like bumetanide, furosemide -teriparatide -thalidomide This list may not describe all possible interactions. Give your health care provider a list of all the medicines, herbs, non-prescription drugs, or dietary supplements you use. Also tell them if  you smoke, drink alcohol, or use illegal drugs. Some items may interact with your medicine. What should I watch for while using this medicine? Visit your doctor or health care professional for regular checkups. It may be some time before you see the benefit from this medicine. Do not stop taking your medicine unless your doctor tells you to. Your doctor may order blood tests or other tests to see how you are doing. Women should inform  their doctor if they wish to become pregnant or think they might be pregnant. There is a potential for serious side effects to an unborn child. Talk to your health care professional or pharmacist for more information. You should make sure that you get enough calcium and vitamin D while you are taking this medicine. Discuss the foods you eat and the vitamins you take with your health care professional. Some people who take this medicine have severe bone, joint, and/or muscle pain. This medicine may also increase your risk for jaw problems or a broken thigh bone. Tell your doctor right away if you have severe pain in your jaw, bones, joints, or muscles. Tell your doctor if you have any pain that does not go away or that gets worse. Tell your dentist and dental surgeon that you are taking this medicine. You should not have major dental surgery while on this medicine. See your dentist to have a dental exam and fix any dental problems before starting this medicine. Take good care of your teeth while on this medicine. Make sure you see your dentist for regular follow-up appointments. What side effects may I notice from receiving this medicine? Side effects that you should report to your doctor or health care professional as soon as possible: -allergic reactions like skin rash, itching or hives, swelling of the face, lips, or tongue -anxiety, confusion, or depression -breathing problems -changes in vision -eye pain -feeling faint or lightheaded, falls -jaw pain, especially after dental work -mouth sores -muscle cramps, stiffness, or weakness -redness, blistering, peeling or loosening of the skin, including inside the mouth -trouble passing urine or change in the amount of urine Side effects that usually do not require medical attention (report to your doctor or health care professional if they continue or are bothersome): -bone, joint, or muscle pain -constipation -diarrhea -fever -hair  loss -irritation at site where injected -loss of appetite -nausea, vomiting -stomach upset -trouble sleeping -trouble swallowing -weak or tired This list may not describe all possible side effects. Call your doctor for medical advice about side effects. You may report side effects to FDA at 1-800-FDA-1088. Where should I keep my medicine? This drug is given in a hospital or clinic and will not be stored at home. NOTE: This sheet is a summary. It may not cover all possible information. If you have questions about this medicine, talk to your doctor, pharmacist, or health care provider.    2016, Elsevier/Gold Standard. (2014-02-11 14:19:39)

## 2015-07-25 NOTE — Progress Notes (Signed)
0930 - Pt seen by Dr. Alvy Bimler in infusion today. Discussed labs and pt concerns about treatment. Pt and daughter aware of plan to run infusion faster today.    1020- Pt to receive Zometa today due to increased CA level. Pt and daughter aware that she needs to start dexamethasone 4mg  daily at home. Will send prescription to pharmacy today and pt daughter will pick up after treatment.  1540- tolerated 2nd treatment of Darzalex. Noted some small amount of blood oozing from port during de access. Held pressure for a few minutes and applied gauze on site. No further bleeding noted upon discharge.Printed AVS for pt and daughter.

## 2015-07-25 NOTE — Progress Notes (Signed)
Psychiatric Initial Adult Assessment   Patient Identification: Victoria [name] MRN:  [id number] Date of Evaluation:  07/25/2015 Referral Source: Dr.Alexie Plotnikov Chief Complaint:   Visit Diagnosis adjustment disorder with depressed mood state Diagnosis:  Adjustment disorder with depressed mood state This patient is a elderly African-American female is not certain of her age. She seen with her daughter Ebony Hail who says the patient is in her late 29s or early 38s. Today the patient has had immunotherapy. She is diagnosed with multiple myeloma. She had a treatment today came to our appointment 20 minutes late. The patient denies being depressed. Her daughter says she's not depressed either. Her daughter encourage this meeting because she wanted to get somebody to talk to her mother is going through this. Illness. Generally she's not sleeping well. Her eating is fair and she has lost some weight. Her energy is relatively low. The patient does little to enjoy herself but denies feeling sad or down. She denies feeling worthless. She denies being suicidal now or ever being suicidal. The patient has been disorders for over 30 years. She has one daughter Bynum Bellows who she lives with and a son named Edd Arbour. The patient has no grandchildren. Patient finished some college got a degree in education and art. This patient denies the use of alcohol. She denies any psychotic symptoms at this time. He denies symptoms consistent with major depression now or ever. He's never been manic. She denies anxiety symptoms that are consistent with generalized anxiety disorder, panic disorder or obsessive-compulsive disorder. This patient has no psychiatric history. She takes a small dose of Ativan that she gets from her primary care doctor. She takes Ativan 0.5 mg one or 2 at night. The patient is spiritual and has a strong religious identity. Patient Active Problem List   Diagnosis Date Noted  . Full code status [Z78.9] 07/02/2015   . Poor venous access [I87.8] 07/02/2015  . Palliative care encounter [Z51.5] 06/07/2015  . Weakness generalized [R53.1] 06/07/2015  . DNR (do not resuscitate) discussion [Z71.89] 06/07/2015  . Protein-calorie malnutrition, severe ([place]) [E43] 05/18/2015  . Hypoglycemia [E16.2] 05/16/2015  . Acute confusional state [F05] 05/14/2015  . Bilateral lower extremity edema [R60.0] 04/27/2015  . Conjunctivitis of left eye [H10.9] 03/30/2015  . Coronary artery calcification [I25.10, I25.84] 03/28/2015  . PVC's (premature ventricular contractions) [I49.3] 03/28/2015  . Weakness [R53.1] 03/28/2015  . Mild dementia [F03.90] 03/07/2015  . Protein calorie malnutrition ([place]) [E46] 03/02/2015  . Dehydration [E86.0] 02/16/2015  . Diarrhea due to drug [K52.1] 02/16/2015  . Leukopenia due to antineoplastic chemotherapy [D72.819, T45.1X5A] 02/01/2015  . Pancytopenia ([place]) [Y86.578] 01/24/2015  . Hypokalemia [E87.6] 12/30/2014  . Multiple myeloma in relapse ([place]) [C90.02] 12/30/2014  . Dementia with behavioral disturbance [F03.91] 10/02/2014  . Hypercalcemia [E83.52] 07/11/2014  . ATAXIA [R27.9] 12/14/2009  . HEARING DEFICIT [H91.90] 05/31/2009  . Anxiety state [F41.1] 03/21/2008  . Anemia [D64.9] 03/08/2008  . INSOMNIA, PERSISTENT [G47.00] 03/08/2008  . 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 Essential hypertension [I10] 11/25/2007   History of Present Illness:  Elements:   Associated Signs/Symptoms: Depression Symptoms:  fatigue, (Hypo) Manic Symptoms:   Anxiety Symptoms:   Psychotic Symptoms:   PTSD Symptoms:   Past Medical History:  Past Medical History  Diagnosis Date  . Hypertension   . Insomnia   . Anxiety   . Anemia   . Multiple myeloma     In remission  . Diverticulosis 04/2001  . Perforation bowel ([place])   .  Ringing in ears     Wears hearing aides to drown out   . Rectal bleeding 02/03/2014  . Anemia in chronic illness 01/24/2015     Past Surgical History  Procedure Laterality Date  . Sigmoid resection / rectopexy  01/2011    perforation/stoma  . Colonoscopy    . Partial hysterectomy    . Cesarean section    . Colostomy takedown  06/27/11  . Cholecystectomy  2012    laparoscopic   Family History:  Family History  Problem Relation Age of Onset  . Hypertension Other   . Prostate cancer Father   . Colitis Neg Hx   . Esophageal cancer Neg Hx   . Stomach cancer Neg Hx    Social History:   Social History   Social History  . Marital Status: Divorced    Spouse Name: N/A  . Number of Children: 2  . Years of Education: N/A   Social History Main Topics  . Smoking status: Never Smoker   . Smokeless tobacco: Never Used  . Alcohol Use: No  . Drug Use: No  . Sexual Activity: No   Other Topics Concern  . Not on file   Social History Narrative   Divorced.  Lives with daughter.  Normally ambulates without assistance.   Additional Social History:   Musculoskeletal: Strength & Muscle Tone: decreased Gait & Station: ataxic Patient leans: Right  Psychiatric Specialty Exam: HPI  ROS  There were no vitals taken for this visit.There is no weight on file to calculate BMI.  General Appearance: Casual  Eye Contact:  Good  Speech:  Clear and Coherent  Volume:  Decreased  Mood:  Euthymic  Affect:  Blunt  Thought Process:  Goal Directed  Orientation:  Full (Time, Place, and Person)  Thought Content:  WDL  Suicidal Thoughts:  No  Homicidal Thoughts:  No  Memory:  Negative  Judgement:  Fair  Insight:  Fair  Psychomotor Activity:  Decreased  Concentration:  Fair  Recall:  Good  Fund of Knowledge:Good  Language: Fair  Akathisia:  Negative  Handed:  Right  AIMS (if indicated):    Assets:  Desire for Improvement  ADL's:  Intact  Cognition: WNL  Sleep:     Is the patient at risk to self?  No. Has the patient been a risk to self in the past 6 months?  No. Has the patient been a risk to self within the  distant past?  No. Is the patient a risk to others?  No. Has the patient been a risk to others in the past 6 months?  No. Has the patient been a risk to others within the distant past?  No.  Allergies:   Allergies  Allergen Reactions  . Amoxicillin Hives and Itching    Has patient had a PCN reaction causing immediate rash, facial/tongue/throat swelling, SOB or lightheadedness with hypotension: No Has patient had a PCN reaction causing severe rash involving mucus membranes or skin necrosis: No Has patient had a PCN reaction that required hospitalization No Has patient had a PCN reaction occurring within the last 10 years: No If all of the above answers are "NO", then may proceed with Cephalosporin use.  . Aspirin Other (See Comments)    Child hood allergy   . Atenolol     Hair loss  . Donepezil Other (See Comments)    agitation   . Doxycycline     Altered mental status   . Nortriptyline  agitation  . Amiodarone Rash  . Latex Dermatitis  . Zoloft [Sertraline Hcl] Anxiety    Increased agitation   Current Medications: Current Outpatient Prescriptions  Medication Sig Dispense Refill  . acyclovir (ZOVIRAX) 400 MG tablet Take 1 tablet (400 mg total) by mouth daily. 60 tablet 11  . cholecalciferol (VITAMIN D) 1000 UNITS tablet Take 2,000 Units by mouth daily.    Marland Kitchen dexamethasone (DECADRON) 4 MG tablet Take 1 tablet (4 mg total) by mouth daily. 30 tablet 0  . lidocaine-prilocaine (EMLA) cream Apply to affected area once 30 g 3  . LORazepam (ATIVAN) 1 MG tablet Take 1 tablet (1 mg total) by mouth every 8 (eight) hours as needed for anxiety. 60 tablet 5  . Multiple Vitamins-Minerals (MULTIVITAMIN & MINERAL PO) Take 1 tablet by mouth daily.    . promethazine (PHENERGAN) 25 MG tablet Take 0.5 tablets (12.5 mg total) by mouth every 6 (six) hours as needed for nausea. 30 tablet 3  . tobramycin (TOBREX) 0.3 % ophthalmic solution Place 1 drop into the left eye every 4 (four) hours. 5 mL 0   . traMADol (ULTRAM) 50 MG tablet Take 1 tablet (50 mg total) by mouth every 6 (six) hours as needed. 60 tablet 0   No current facility-administered medications for this visit.   Facility-Administered Medications Ordered in Other Visits  Medication Dose Route Frequency Provider Last Rate Last Dose  . 0.9 %  sodium chloride infusion   Intravenous Once Heath Lark, MD      . sodium chloride 0.9 % injection 10 mL  10 mL Intracatheter PRN Heath Lark, MD   10 mL at 07/25/15 1529    Previous Psychotropic Medications: No   Substance Abuse History in the last 12 months:  No.  Consequences of Substance Abuse:   Medical Decision Making:  Established Problem, Stable/Improving (1)  Treatment Plan Summary: At this time the patient does not demonstrate a significant major mental illness. I believe she has some reactive depression and I believe there is a lot for her to talk about. She is completely willing to enter into therapy to talk about her life and what she's going through. This patient is not suicidal not psychotic and is not agitated. The patient seems to be tolerating chemotherapy efforts with immunotherapy quite well. Her daughter is very supportive. Her daughter lives with her. Today the patient was referred to Dr. Lauris Chroman for psychotherapy. This will be supportive psychotherapy around the stress and issue of her cancer treatment. This patient is not suicidal and generally is safe and stable where she's living. This patient was not given a return appointment. We instructed her energy daughter to return if there are any changes in if they felt she needed to be reevaluated. The medicines that she is taking, the Ativan is being prescribed by her primary care doctor and that will continue.    [place], [place] 10/26/20164:56 PM

## 2015-07-25 NOTE — Telephone Encounter (Signed)
[place] I'm PCP Thx

## 2015-07-26 ENCOUNTER — Encounter: Payer: Self-pay | Admitting: Hematology and Oncology

## 2015-07-26 NOTE — Telephone Encounter (Signed)
Notified Carol with md response...Victoria [name]

## 2015-07-26 NOTE — Progress Notes (Signed)
[facility] OFFICE PROGRESS NOTE  Patient Care Team: Heath Lark, MD as PCP - General (Hematology and Oncology) Lafayette Dragon, MD as Consulting Physician (Gastroenterology) Leonie Man, MD as Consulting Physician (Cardiology) Heath Lark, MD as Consulting Physician (Hematology and Oncology)  SUMMARY OF ONCOLOGIC HISTORY:   Multiple myeloma in relapse ([place])   12/30/2014 Initial Diagnosis Multiple myeloma in relapse   02/05/2015 Bone Marrow Biopsy BM biopsy was non-diagnostic. FISH was positive for loss of 17p and gain of chromosome 11   02/06/2015 - 04/30/2015 Chemotherapy She was started on Ninlaro, dexamethasone and Revlimid.   02/16/2015 Adverse Reaction Revlimid was placed on hold due to uncontrolled diarrhea.   05/08/2015 - 06/15/2015 Hospital Admission She has recurrent admissions to the hospital for failure to thrive   07/16/2015 Procedure She has port placement   07/18/2015 -  Chemotherapy She received weekly Daratumumab    INTERVAL HISTORY: Please see below for problem oriented charting. She is seen at infusion prior to treatment. She continues to complain of fatigue, poor appetite but denies pain. Her conjunctivitis has improved. Denies recent infection.  REVIEW OF SYSTEMS:   Constitutional: Denies fevers, chills or abnormal weight loss Eyes: Denies blurriness of vision Ears, nose, mouth, throat, and face: Denies mucositis or sore throat Respiratory: Denies cough, dyspnea or wheezes Cardiovascular: Denies palpitation, chest discomfort or lower extremity swelling Gastrointestinal:  Denies nausea, heartburn or change in bowel habits Skin: Denies abnormal skin rashes Lymphatics: Denies new lymphadenopathy or easy bruising Neurological:Denies numbness, tingling or new weaknesses Behavioral/Psych: Mood is stable, no new changes  All other systems were reviewed with the patient and are negative.  I have reviewed the past medical history, past surgical history, social  history and family history with the patient and they are unchanged from previous note.  ALLERGIES:  is allergic to amoxicillin; aspirin; atenolol; donepezil; doxycycline; nortriptyline; amiodarone; latex; and zoloft.  MEDICATIONS:  Current Outpatient Prescriptions  Medication Sig Dispense Refill  . acyclovir (ZOVIRAX) 400 MG tablet Take 1 tablet (400 mg total) by mouth daily. 60 tablet 11  . cholecalciferol (VITAMIN D) 1000 UNITS tablet Take 2,000 Units by mouth daily.    Marland Kitchen dexamethasone (DECADRON) 4 MG tablet Take 1 tablet (4 mg total) by mouth daily. 30 tablet 0  . lidocaine-prilocaine (EMLA) cream Apply to affected area once 30 g 3  . LORazepam (ATIVAN) 1 MG tablet Take 1 tablet (1 mg total) by mouth every 8 (eight) hours as needed for anxiety. 60 tablet 5  . Multiple Vitamins-Minerals (MULTIVITAMIN & MINERAL PO) Take 1 tablet by mouth daily.    . promethazine (PHENERGAN) 25 MG tablet Take 0.5 tablets (12.5 mg total) by mouth every 6 (six) hours as needed for nausea. 30 tablet 3  . tobramycin (TOBREX) 0.3 % ophthalmic solution Place 1 drop into the left eye every 4 (four) hours. 5 mL 0  . traMADol (ULTRAM) 50 MG tablet Take 1 tablet (50 mg total) by mouth every 6 (six) hours as needed. 60 tablet 0   No current facility-administered medications for this visit.   Facility-Administered Medications Ordered in Other Visits  Medication Dose Route Frequency Provider Last Rate Last Dose  . 0.9 %  sodium chloride infusion   Intravenous Once Heath Lark, MD        PHYSICAL EXAMINATION: ECOG PERFORMANCE STATUS: 2 - Symptomatic, <50% confined to bed GENERAL:alert, no distress and comfortable. She looks thin and cachectic SKIN: skin color, texture, turgor are normal, no rashes or significant  lesions EYES: normal, Conjunctiva are pink and non-injected, sclera clear OROPHARYNX:no exudate, no erythema and lips, buccal mucosa, and tongue normal  NECK: supple, thyroid normal size, non-tender, without  nodularity LYMPH:  no palpable lymphadenopathy in the cervical, axillary or inguinal LUNGS: clear to auscultation and percussion with normal breathing effort HEART: regular rate & rhythm and no murmurs and no lower extremity edema ABDOMEN:abdomen soft, non-tender and normal bowel sounds Musculoskeletal:no cyanosis of digits and no clubbing  NEURO: alert & oriented x 3 with fluent speech, no focal motor/sensory deficits  LABORATORY DATA:  I have reviewed the data as listed    Component Value Date/Time   NA 139 07/25/2015 0850   NA 139 06/14/2015 2248   K 4.0 07/25/2015 0850   K 3.8 06/14/2015 2248   CL 105 06/14/2015 2248   CL 103 03/11/2013 1503   CO2 29 07/25/2015 0850   CO2 28 06/14/2015 2248   GLUCOSE 110 07/25/2015 0850   GLUCOSE 172* 06/14/2015 2248   GLUCOSE 91 03/11/2013 1503   BUN 15.5 07/25/2015 0850   BUN 37* 06/14/2015 2248   CREATININE 0.7 07/25/2015 0850   CREATININE 0.79 06/14/2015 2248   CALCIUM 11.1* 07/25/2015 0850   CALCIUM 9.4 06/14/2015 2248   CALCIUM 11.4* 12/30/2014 0835   PROT 8.9* 07/25/2015 0850   PROT 8.3* 06/14/2015 2248   ALBUMIN 2.5* 07/25/2015 0850   ALBUMIN 2.6* 06/14/2015 2248   AST 24 07/25/2015 0850   AST 25 06/14/2015 2248   ALT 22 07/25/2015 0850   ALT 26 06/14/2015 2248   ALKPHOS 59 07/25/2015 0850   ALKPHOS 49 06/14/2015 2248   BILITOT 0.49 07/25/2015 0850   BILITOT 0.7 06/14/2015 2248   GFRNONAA >60 06/14/2015 2248   GFRAA >60 06/14/2015 2248    No results found for: SPEP, UPEP  Lab Results  Component Value Date   WBC 3.1* 07/25/2015   NEUTROABS 2.3 07/25/2015   HGB 9.1* 07/25/2015   HCT 29.1* 07/25/2015   MCV 97.7 07/25/2015   PLT 254 07/25/2015      Chemistry      Component Value Date/Time   NA 139 07/25/2015 0850   NA 139 06/14/2015 2248   K 4.0 07/25/2015 0850   K 3.8 06/14/2015 2248   CL 105 06/14/2015 2248   CL 103 03/11/2013 1503   CO2 29 07/25/2015 0850   CO2 28 06/14/2015 2248   BUN 15.5 07/25/2015  0850   BUN 37* 06/14/2015 2248   CREATININE 0.7 07/25/2015 0850   CREATININE 0.79 06/14/2015 2248      Component Value Date/Time   CALCIUM 11.1* 07/25/2015 0850   CALCIUM 9.4 06/14/2015 2248   CALCIUM 11.4* 12/30/2014 0835   ALKPHOS 59 07/25/2015 0850   ALKPHOS 49 06/14/2015 2248   AST 24 07/25/2015 0850   AST 25 06/14/2015 2248   ALT 22 07/25/2015 0850   ALT 26 06/14/2015 2248   BILITOT 0.49 07/25/2015 0850   BILITOT 0.7 06/14/2015 2248      ASSESSMENT & PLAN:  Multiple myeloma in relapse Her performance status remained poor. She have recurrence of hypercalcemia again. The patient cannot make much discission and her daughter wants [place] to proceed with further treatment. I will continue treatment today without dose adjustment. With recurrent hypercalcemia, I will add dexamethasone daily and restart Zometa.  Pancytopenia ([facility]) Anemia is likely due to anemia of chronic disease and bone marrow infiltration from multiple myeloma. She is not symptomatic. We will proceed with treatment without dose adjustment.  Protein calorie malnutrition ([place]) She has failure to thrive and lost some weight. I recommend she continue nutritional supplement as tolerated. She will be seen by a dietitian today.    Hypercalcemia This is due to poorly controlled multiple myeloma. I will proceed to add dexamethasone and Zometa.   No orders of the defined types were placed in this encounter.   All questions were answered. The patient knows to call the clinic with any problems, questions or concerns. No barriers to learning was detected. I spent 25 minutes counseling the patient face to face. The total time spent in the appointment was 40 minutes and more than 50% was on counseling and review of test results     [facility], [place], MD 07/26/2015 7:52 AM  \

## 2015-07-26 NOTE — Assessment & Plan Note (Signed)
This is due to poorly controlled multiple myeloma. I will proceed to add dexamethasone and Zometa.

## 2015-07-26 NOTE — Assessment & Plan Note (Signed)
She has failure to thrive and lost some weight. I recommend she continue nutritional supplement as tolerated. She will be seen by a dietitian today.

## 2015-07-26 NOTE — Assessment & Plan Note (Signed)
Anemia is likely due to anemia of chronic disease and bone marrow infiltration from multiple myeloma. She is not symptomatic. We will proceed with treatment without dose adjustment. 

## 2015-07-26 NOTE — Assessment & Plan Note (Signed)
Her performance status remained poor. She have recurrence of hypercalcemia again. The patient cannot make much discission and her daughter wants [place] to proceed with further treatment. I will continue treatment today without dose adjustment. With recurrent hypercalcemia, I will add dexamethasone daily and restart Zometa.

## 2015-07-31 ENCOUNTER — Telehealth: Payer: Self-pay | Admitting: *Deleted

## 2015-07-31 ENCOUNTER — Other Ambulatory Visit: Payer: Self-pay | Admitting: Hematology and Oncology

## 2015-07-31 NOTE — Telephone Encounter (Signed)
Patient's daughter called and requested appts to be moved to later times. I have called and gave her appts for tomorrow. They will pickup schedule tomorrow

## 2015-08-01 ENCOUNTER — Ambulatory Visit: Payer: Medicare Other | Admitting: Nutrition

## 2015-08-01 ENCOUNTER — Ambulatory Visit (HOSPITAL_BASED_OUTPATIENT_CLINIC_OR_DEPARTMENT_OTHER): Payer: Medicare Other

## 2015-08-01 ENCOUNTER — Ambulatory Visit: Payer: Medicare Other

## 2015-08-01 ENCOUNTER — Other Ambulatory Visit (HOSPITAL_BASED_OUTPATIENT_CLINIC_OR_DEPARTMENT_OTHER): Payer: Medicare Other

## 2015-08-01 ENCOUNTER — Telehealth: Payer: Self-pay | Admitting: *Deleted

## 2015-08-01 VITALS — BP 136/64 | HR 63 | Temp 98.8°F | Resp 18

## 2015-08-01 DIAGNOSIS — C9002 Multiple myeloma in relapse: Secondary | ICD-10-CM

## 2015-08-01 DIAGNOSIS — Z5112 Encounter for antineoplastic immunotherapy: Secondary | ICD-10-CM

## 2015-08-01 DIAGNOSIS — Z95828 Presence of other vascular implants and grafts: Secondary | ICD-10-CM

## 2015-08-01 LAB — CBC WITH DIFFERENTIAL/PLATELET
BASO%: 0.1 % (ref 0.0–2.0)
BASOS ABS: 0 10*3/uL (ref 0.0–0.1)
EOS%: 0.1 % (ref 0.0–7.0)
Eosinophils Absolute: 0 10*3/uL (ref 0.0–0.5)
HEMATOCRIT: 26.8 % — AB (ref 34.8–46.6)
HEMOGLOBIN: 8.6 g/dL — AB (ref 11.6–15.9)
LYMPH#: 0.4 10*3/uL — AB (ref 0.9–3.3)
LYMPH%: 12.7 % — ABNORMAL LOW (ref 14.0–49.7)
MCH: 30.7 pg (ref 25.1–34.0)
MCHC: 32.2 g/dL (ref 31.5–36.0)
MCV: 95.3 fL (ref 79.5–101.0)
MONO#: 0.3 10*3/uL (ref 0.1–0.9)
MONO%: 10.1 % (ref 0.0–14.0)
NEUT%: 77 % — ABNORMAL HIGH (ref 38.4–76.8)
NEUTROS ABS: 2.3 10*3/uL (ref 1.5–6.5)
Platelets: 314 10*3/uL (ref 145–400)
RBC: 2.81 10*6/uL — ABNORMAL LOW (ref 3.70–5.45)
RDW: 17.8 % — AB (ref 11.2–14.5)
WBC: 2.9 10*3/uL — AB (ref 3.9–10.3)

## 2015-08-01 LAB — COMPREHENSIVE METABOLIC PANEL (CC13)
ALBUMIN: 2.4 g/dL — AB (ref 3.5–5.0)
ALK PHOS: 55 U/L (ref 40–150)
ALT: 22 U/L (ref 0–55)
AST: 17 U/L (ref 5–34)
Anion Gap: 6 mEq/L (ref 3–11)
BUN: 17.5 mg/dL (ref 7.0–26.0)
CALCIUM: 9.6 mg/dL (ref 8.4–10.4)
CO2: 28 mEq/L (ref 22–29)
CREATININE: 0.7 mg/dL (ref 0.6–1.1)
Chloride: 105 mEq/L (ref 98–109)
EGFR: >90 mL/min/{1.73_m2} (ref >90)
GLUCOSE: 110 mg/dL (ref 70–140)
POTASSIUM: 3.7 meq/L (ref 3.5–5.1)
SODIUM: 138 meq/L (ref 136–145)
TOTAL PROTEIN: 8.5 g/dL — AB (ref 6.4–8.3)
Total Bilirubin: 0.55 mg/dL (ref 0.20–1.20)

## 2015-08-01 MED ORDER — SODIUM CHLORIDE 0.9 % IV SOLN
Freq: Once | INTRAVENOUS | Status: AC
  Administered 2015-08-01: 10:00:00 via INTRAVENOUS

## 2015-08-01 MED ORDER — DIPHENHYDRAMINE HCL 25 MG PO CAPS
50.0000 mg | ORAL_CAPSULE | Freq: Once | ORAL | Status: AC
  Administered 2015-08-01: 50 mg via ORAL

## 2015-08-01 MED ORDER — SODIUM CHLORIDE 0.9 % IV SOLN
Freq: Once | INTRAVENOUS | Status: AC
  Administered 2015-08-01: 10:00:00 via INTRAVENOUS
  Filled 2015-08-01: qty 4

## 2015-08-01 MED ORDER — METHYLPREDNISOLONE SODIUM SUCC 125 MG IJ SOLR
60.0000 mg | Freq: Once | INTRAMUSCULAR | Status: AC
  Administered 2015-08-01: 60 mg via INTRAVENOUS

## 2015-08-01 MED ORDER — SODIUM CHLORIDE 0.9 % IJ SOLN
10.0000 mL | INTRAMUSCULAR | Status: AC | PRN
  Administered 2015-08-01 (×2): 10 mL via INTRAVENOUS
  Filled 2015-08-01: qty 10

## 2015-08-01 MED ORDER — SODIUM CHLORIDE 0.9 % IJ SOLN
10.0000 mL | INTRAMUSCULAR | Status: DC | PRN
  Filled 2015-08-01: qty 10

## 2015-08-01 MED ORDER — ACETAMINOPHEN 325 MG PO TABS
ORAL_TABLET | ORAL | Status: AC
  Filled 2015-08-01: qty 2

## 2015-08-01 MED ORDER — HEPARIN SOD (PORK) LOCK FLUSH 100 UNIT/ML IV SOLN
500.0000 [IU] | Freq: Once | INTRAVENOUS | Status: AC | PRN
  Administered 2015-08-01: 500 [IU]
  Filled 2015-08-01: qty 5

## 2015-08-01 MED ORDER — SODIUM CHLORIDE 0.9 % IV SOLN
16.0000 mg/kg | Freq: Once | INTRAVENOUS | Status: AC
  Administered 2015-08-01: 700 mg via INTRAVENOUS
  Filled 2015-08-01: qty 20

## 2015-08-01 MED ORDER — METHYLPREDNISOLONE SODIUM SUCC 125 MG IJ SOLR
INTRAMUSCULAR | Status: AC
  Filled 2015-08-01: qty 2

## 2015-08-01 MED ORDER — DIPHENHYDRAMINE HCL 25 MG PO CAPS
ORAL_CAPSULE | ORAL | Status: AC
  Filled 2015-08-01: qty 2

## 2015-08-01 MED ORDER — ACETAMINOPHEN 325 MG PO TABS
650.0000 mg | ORAL_TABLET | Freq: Once | ORAL | Status: AC
Start: 2015-08-01 — End: 2015-08-01
  Administered 2015-08-01: 650 mg via ORAL

## 2015-08-01 NOTE — Patient Instructions (Signed)

## 2015-08-01 NOTE — Telephone Encounter (Signed)
S/w pt's daughter in Infusion Room.  Informed her of Calcium improved and Dr. Alvy Bimler instructs for pt to stop Dexamethasone.   She verbalized understanding.   She also states she would like pt to be referred to Palliative Care per discussion w/ Dr. Alvy Bimler.  Informed her to expect call from [facility] [place], Nurse [place], [employer].   LVM w/ Myriam Jacobson at [facility] for [place] per Dr. Alvy Bimler.

## 2015-08-01 NOTE — Telephone Encounter (Signed)
Received call back/ confirmation from [facility] at [facility] they got referral for Palliative Care.

## 2015-08-01 NOTE — Progress Notes (Signed)
Nutrition follow-up completed with patient and daughter, during infusion for multiple myeloma. Weight decreased and documented as 94.25 pounds October 26, down from 95.3 pounds October 3. Patient states she thinks her appetite is better and she is eating more. Patient now requesting four ounce bottles of oral nutrition supplement and has agreed to work on increasing these on a daily basis.  Nutrition diagnosis:  Inadequate oral intake continues.  Severe malnutrition continues.  Intervention:  Provided additional samples of boost compact and ensure contact and encouraged patient to drink 3 times a day Encouraged increased consumption of high protein, high calorie foods. Teach back method used.  Monitoring, evaluation, goals: Patient will work to increase calories and protein to promote weight gain.  Next visit: Wednesday, November 9, during infusion.  **Disclaimer: This note was dictated with voice recognition software. Similar sounding words can inadvertently be transcribed and this note may contain transcription errors which may not have been corrected upon publication of note.**

## 2015-08-01 NOTE — Telephone Encounter (Signed)
-----   Message from Heath Lark, MD sent at 08/01/2015 10:06 AM EDT ----- Regarding: labs Calcium level is better ----- Message -----    From: Lab in [facility] Interface    Sent: 08/01/2015   9:21 AM      To: Heath Lark, MD

## 2015-08-01 NOTE — Patient Instructions (Signed)
[facility] Discharge Instructions for Patients Receiving Chemotherapy  Today you received the following chemotherapy agents Darzalex.  To help prevent nausea and vomiting after your treatment, we encourage you to take your nausea medication as directed.  If you develop nausea and vomiting that is not controlled by your nausea medication, call the clinic.   BELOW ARE SYMPTOMS THAT SHOULD BE REPORTED IMMEDIATELY:  *FEVER GREATER THAN 100.5 F  *CHILLS WITH OR WITHOUT FEVER  NAUSEA AND VOMITING THAT IS NOT CONTROLLED WITH YOUR NAUSEA MEDICATION  *UNUSUAL SHORTNESS OF BREATH  *UNUSUAL BRUISING OR BLEEDING  TENDERNESS IN MOUTH AND THROAT WITH OR WITHOUT PRESENCE OF ULCERS  *URINARY PROBLEMS  *BOWEL PROBLEMS  UNUSUAL RASH Items with * indicate a potential emergency and should be followed up as soon as possible.  Feel free to call the clinic you have any questions or concerns. The clinic phone number is [phone].  Please show the CHEMO ALERT CARD at check-in to the Emergency Department and triage nurse.    

## 2015-08-07 ENCOUNTER — Telehealth: Payer: Self-pay | Admitting: Hematology and Oncology

## 2015-08-07 NOTE — Telephone Encounter (Signed)
Pt called to cx nut appt

## 2015-08-08 ENCOUNTER — Telehealth: Payer: Self-pay | Admitting: Hematology and Oncology

## 2015-08-08 ENCOUNTER — Ambulatory Visit: Payer: Medicare Other

## 2015-08-08 ENCOUNTER — Ambulatory Visit (HOSPITAL_BASED_OUTPATIENT_CLINIC_OR_DEPARTMENT_OTHER): Payer: Medicare Other | Admitting: Hematology and Oncology

## 2015-08-08 ENCOUNTER — Encounter: Payer: Self-pay | Admitting: Hematology and Oncology

## 2015-08-08 ENCOUNTER — Other Ambulatory Visit (HOSPITAL_BASED_OUTPATIENT_CLINIC_OR_DEPARTMENT_OTHER): Payer: Medicare Other

## 2015-08-08 ENCOUNTER — Ambulatory Visit (HOSPITAL_BASED_OUTPATIENT_CLINIC_OR_DEPARTMENT_OTHER): Payer: Medicare Other

## 2015-08-08 ENCOUNTER — Encounter: Payer: Self-pay | Admitting: Nutrition

## 2015-08-08 VITALS — BP 147/57 | HR 59 | Temp 97.5°F | Resp 18 | Ht 61.0 in | Wt 93.5 lb

## 2015-08-08 DIAGNOSIS — D61818 Other pancytopenia: Secondary | ICD-10-CM | POA: Diagnosis not present

## 2015-08-08 DIAGNOSIS — C9002 Multiple myeloma in relapse: Secondary | ICD-10-CM

## 2015-08-08 DIAGNOSIS — R300 Dysuria: Secondary | ICD-10-CM

## 2015-08-08 DIAGNOSIS — T451X5A Adverse effect of antineoplastic and immunosuppressive drugs, initial encounter: Secondary | ICD-10-CM

## 2015-08-08 DIAGNOSIS — Z95828 Presence of other vascular implants and grafts: Secondary | ICD-10-CM

## 2015-08-08 DIAGNOSIS — E43 Unspecified severe protein-calorie malnutrition: Secondary | ICD-10-CM

## 2015-08-08 DIAGNOSIS — D701 Agranulocytosis secondary to cancer chemotherapy: Secondary | ICD-10-CM

## 2015-08-08 LAB — CBC & DIFF AND RETIC
BASO%: 0 % (ref 0.0–2.0)
BASOS ABS: 0 10*3/uL (ref 0.0–0.1)
EOS ABS: 0 10*3/uL (ref 0.0–0.5)
EOS%: 0 % (ref 0.0–7.0)
HCT: 28.1 % — ABNORMAL LOW (ref 34.8–46.6)
HEMOGLOBIN: 8.8 g/dL — AB (ref 11.6–15.9)
IMMATURE RETIC FRACT: 18.6 % — AB (ref 1.60–10.00)
LYMPH%: 20.6 % (ref 14.0–49.7)
MCH: 30.2 pg (ref 25.1–34.0)
MCHC: 31.3 g/dL — ABNORMAL LOW (ref 31.5–36.0)
MCV: 96.6 fL (ref 79.5–101.0)
MONO#: 0.4 10*3/uL (ref 0.1–0.9)
MONO%: 20.1 % — AB (ref 0.0–14.0)
NEUT%: 59.3 % (ref 38.4–76.8)
NEUTROS ABS: 1.1 10*3/uL — AB (ref 1.5–6.5)
PLATELETS: 247 10*3/uL (ref 145–400)
RBC: 2.91 10*6/uL — AB (ref 3.70–5.45)
RDW: 17 % — ABNORMAL HIGH (ref 11.2–14.5)
RETIC CT ABS: 55 10*3/uL (ref 33.70–90.70)
Retic %: 1.89 % (ref 0.70–2.10)
WBC: 1.9 10*3/uL — AB (ref 3.9–10.3)
lymph#: 0.4 10*3/uL — ABNORMAL LOW (ref 0.9–3.3)
nRBC: 0 % (ref 0–0)

## 2015-08-08 LAB — COMPREHENSIVE METABOLIC PANEL (CC13)
ALBUMIN: 2.4 g/dL — AB (ref 3.5–5.0)
ALT: 22 U/L (ref 0–55)
ANION GAP: 6 meq/L (ref 3–11)
AST: 20 U/L (ref 5–34)
Alkaline Phosphatase: 61 U/L (ref 40–150)
BUN: 13.1 mg/dL (ref 7.0–26.0)
CO2: 26 meq/L (ref 22–29)
CREATININE: 0.7 mg/dL (ref 0.6–1.1)
Calcium: 10.1 mg/dL (ref 8.4–10.4)
Chloride: 105 mEq/L (ref 98–109)
EGFR: >90 mL/min/{1.73_m2} (ref >90)
GLUCOSE: 77 mg/dL (ref 70–140)
Potassium: 4.1 mEq/L (ref 3.5–5.1)
Sodium: 137 mEq/L (ref 136–145)
TOTAL PROTEIN: 9.2 g/dL — AB (ref 6.4–8.3)
Total Bilirubin: 0.61 mg/dL (ref 0.20–1.20)

## 2015-08-08 LAB — URINALYSIS, MICROSCOPIC - CHCC
BILIRUBIN (URINE): NEGATIVE
BLOOD: NEGATIVE
Glucose: NEGATIVE mg/dL
KETONES: NEGATIVE mg/dL
LEUKOCYTE ESTERASE: NEGATIVE
Nitrite: NEGATIVE
Protein: 30 mg/dL
SPECIFIC GRAVITY, URINE: 1.01 (ref 1.003–1.035)
UROBILINOGEN UR: 0.2 mg/dL (ref 0.2–1)
pH: 7.5 (ref 4.6–8.0)

## 2015-08-08 MED ORDER — OXYCODONE-ACETAMINOPHEN 5-325 MG PO TABS
1.0000 | ORAL_TABLET | Freq: Once | ORAL | Status: AC
Start: 2015-08-08 — End: 2015-08-08
  Administered 2015-08-08: 1 via ORAL

## 2015-08-08 MED ORDER — OXYCODONE-ACETAMINOPHEN 5-325 MG PO TABS
ORAL_TABLET | ORAL | Status: AC
  Filled 2015-08-08: qty 1

## 2015-08-08 MED ORDER — SODIUM CHLORIDE 0.9 % IJ SOLN
10.0000 mL | INTRAMUSCULAR | Status: DC | PRN
  Administered 2015-08-08: 10 mL
  Filled 2015-08-08: qty 10

## 2015-08-08 MED ORDER — SODIUM CHLORIDE 0.9 % IJ SOLN
10.0000 mL | INTRAMUSCULAR | Status: DC | PRN
  Filled 2015-08-08: qty 10

## 2015-08-08 MED ORDER — SODIUM CHLORIDE 0.9 % IV SOLN
1000.0000 mL | Freq: Once | INTRAVENOUS | Status: AC
  Administered 2015-08-08: 1000 mL via INTRAVENOUS

## 2015-08-08 MED ORDER — HEPARIN SOD (PORK) LOCK FLUSH 100 UNIT/ML IV SOLN
500.0000 [IU] | Freq: Once | INTRAVENOUS | Status: AC | PRN
  Administered 2015-08-08: 500 [IU]
  Filled 2015-08-08: qty 5

## 2015-08-08 MED ORDER — HEPARIN SOD (PORK) LOCK FLUSH 100 UNIT/ML IV SOLN
500.0000 [IU] | Freq: Once | INTRAVENOUS | Status: DC
  Filled 2015-08-08: qty 5

## 2015-08-08 NOTE — Patient Instructions (Signed)

## 2015-08-08 NOTE — Progress Notes (Signed)
[place] OFFICE PROGRESS NOTE  Patient Care Team: Heath Lark, MD as PCP - General (Hematology and Oncology) Lafayette Dragon, MD as Consulting Physician (Gastroenterology) Leonie Man, MD as Consulting Physician (Cardiology) Heath Lark, MD as Consulting Physician (Hematology and Oncology)  SUMMARY OF ONCOLOGIC HISTORY:   Multiple myeloma in relapse ([name])   12/30/2014 Initial Diagnosis Multiple myeloma in relapse   02/05/2015 Bone Marrow Biopsy BM biopsy was non-diagnostic. FISH was positive for loss of 17p and gain of chromosome 11   02/06/2015 - 04/30/2015 Chemotherapy She was started on Ninlaro, dexamethasone and Revlimid.   02/16/2015 Adverse Reaction Revlimid was placed on hold due to uncontrolled diarrhea.   05/08/2015 - 06/15/2015 Hospital Admission She has recurrent admissions to the hospital for failure to thrive   07/16/2015 Procedure She has port placement   07/18/2015 -  Chemotherapy She received weekly Daratumumab    INTERVAL HISTORY: Please see below for problem oriented charting.  she is seen today prior to cycle 4 of treatment with her daughter. The patient is not able to give any history. She appears sleepy. According to her daughter, the patient usually sleeps during daytime and up and awake at nighttime. She is eating poorly and has lost more weight. The patient is noted to have frequent  Trips to the bathroom. She denies pain. Intermittent confusion is noted  REVIEW OF SYSTEMS:   Constitutional: Denies fevers, chills or abnormal weight loss Eyes: Denies blurriness of vision Ears, nose, mouth, throat, and face: Denies mucositis or sore throat Respiratory: Denies cough, dyspnea or wheezes Cardiovascular: Denies palpitation, chest discomfort or lower extremity swelling Gastrointestinal:  Denies nausea, heartburn or change in bowel habits Skin: Denies abnormal skin rashes Lymphatics: Denies new lymphadenopathy or easy bruising All other systems were  reviewed with the patient and are negative.  I have reviewed the past medical history, past surgical history, social history and family history with the patient and they are unchanged from previous note.  ALLERGIES:  is allergic to amoxicillin; aspirin; atenolol; donepezil; doxycycline; nortriptyline; amiodarone; latex; and zoloft.  MEDICATIONS:  Current Outpatient Prescriptions  Medication Sig Dispense Refill  . acyclovir (ZOVIRAX) 400 MG tablet Take 1 tablet (400 mg total) by mouth daily. 60 tablet 11  . cholecalciferol (VITAMIN D) 1000 UNITS tablet Take 2,000 Units by mouth daily.    Marland Kitchen dexamethasone (DECADRON) 4 MG tablet Take 1 tablet (4 mg total) by mouth daily. 30 tablet 0  . lidocaine-prilocaine (EMLA) cream Apply to affected area once 30 g 3  . LORazepam (ATIVAN) 1 MG tablet Take 1 tablet (1 mg total) by mouth every 8 (eight) hours as needed for anxiety. 60 tablet 5  . Multiple Vitamins-Minerals (MULTIVITAMIN & MINERAL PO) Take 1 tablet by mouth daily.    . promethazine (PHENERGAN) 25 MG tablet Take 0.5 tablets (12.5 mg total) by mouth every 6 (six) hours as needed for nausea. 30 tablet 3  . tobramycin (TOBREX) 0.3 % ophthalmic solution Place 1 drop into the left eye every 4 (four) hours. 5 mL 0  . traMADol (ULTRAM) 50 MG tablet Take 1 tablet (50 mg total) by mouth every 6 (six) hours as needed. 60 tablet 0   Current Facility-Administered Medications  Medication Dose Route Frequency Provider Last Rate Last Dose  . heparin lock flush 100 unit/mL  500 Units Intravenous Once Heath Lark, MD      . sodium chloride 0.9 % injection 10 mL  10 mL Intravenous PRN Heath Lark, MD  Facility-Administered Medications Ordered in Other Visits  Medication Dose Route Frequency Provider Last Rate Last Dose  . 0.9 %  sodium chloride infusion   Intravenous Once Heath Lark, MD      . sodium chloride 0.9 % injection 10 mL  10 mL Intravenous PRN Heath Lark, MD   10 mL at 08/01/15 1405  . sodium  chloride 0.9 % injection 10 mL  10 mL Intracatheter PRN Heath Lark, MD   10 mL at 08/08/15 1312    PHYSICAL EXAMINATION: ECOG PERFORMANCE STATUS: 2 - Symptomatic, <50% confined to bed  Filed Vitals:   08/08/15 0937  BP: 147/57  Pulse: 59  Temp: 97.5 F (36.4 C)  Resp: 18   Filed Weights   08/08/15 0937  Weight: 93 lb 8 oz (42.411 kg)    GENERAL:alert, no distress and comfortable. She looks thin and cachectic SKIN: skin color, texture, turgor are normal, no rashes or significant lesions EYES: normal, Conjunctiva are pink and non-injected, sclera clear Musculoskeletal:no cyanosis of digits and no clubbing  NEURO: she appears sleepy, no focal motor/sensory deficits  LABORATORY DATA:  I have reviewed the data as listed    Component Value Date/Time   NA 137 08/08/2015 0914   NA 139 06/14/2015 2248   K 4.1 08/08/2015 0914   K 3.8 06/14/2015 2248   CL 105 06/14/2015 2248   CL 103 03/11/2013 1503   CO2 26 08/08/2015 0914   CO2 28 06/14/2015 2248   GLUCOSE 77 08/08/2015 0914   GLUCOSE 172* 06/14/2015 2248   GLUCOSE 91 03/11/2013 1503   BUN 13.1 08/08/2015 0914   BUN 37* 06/14/2015 2248   CREATININE 0.7 08/08/2015 0914   CREATININE 0.79 06/14/2015 2248   CALCIUM 10.1 08/08/2015 0914   CALCIUM 9.4 06/14/2015 2248   CALCIUM 11.4* 12/30/2014 0835   PROT 9.2* 08/08/2015 0914   PROT 8.3* 06/14/2015 2248   ALBUMIN 2.4* 08/08/2015 0914   ALBUMIN 2.6* 06/14/2015 2248   AST 20 08/08/2015 0914   AST 25 06/14/2015 2248   ALT 22 08/08/2015 0914   ALT 26 06/14/2015 2248   ALKPHOS 61 08/08/2015 0914   ALKPHOS 49 06/14/2015 2248   BILITOT 0.61 08/08/2015 0914   BILITOT 0.7 06/14/2015 2248   GFRNONAA >60 06/14/2015 2248   GFRAA >60 06/14/2015 2248    No results found for: SPEP, UPEP  Lab Results  Component Value Date   WBC 1.9* 08/08/2015   NEUTROABS 1.1* 08/08/2015   HGB 8.8* 08/08/2015   HCT 28.1* 08/08/2015   MCV 96.6 08/08/2015   PLT 247 08/08/2015      Chemistry       Component Value Date/Time   NA 137 08/08/2015 0914   NA 139 06/14/2015 2248   K 4.1 08/08/2015 0914   K 3.8 06/14/2015 2248   CL 105 06/14/2015 2248   CL 103 03/11/2013 1503   CO2 26 08/08/2015 0914   CO2 28 06/14/2015 2248   BUN 13.1 08/08/2015 0914   BUN 37* 06/14/2015 2248   CREATININE 0.7 08/08/2015 0914   CREATININE 0.79 06/14/2015 2248      Component Value Date/Time   CALCIUM 10.1 08/08/2015 0914   CALCIUM 9.4 06/14/2015 2248   CALCIUM 11.4* 12/30/2014 0835   ALKPHOS 61 08/08/2015 0914   ALKPHOS 49 06/14/2015 2248   AST 20 08/08/2015 0914   AST 25 06/14/2015 2248   ALT 22 08/08/2015 0914   ALT 26 06/14/2015 2248   BILITOT 0.61 08/08/2015 0914   BILITOT  0.7 06/14/2015 2248      ASSESSMENT & PLAN:  Multiple myeloma in relapse She has significant poor performance status. She continued to have progressive weight loss and intermittent confusion. She have recurrence of hypercalcemia. Frankly, I recommend we discontinue treatment and refer her to hospice. Her daughter has been reluctant to stop treatment because she felt that stopping treatment would mean giving up hope. I told her that immunotherapy does not appears to be working at present time although it is not confirmed without repeat serum protein electrophoresis and free light chains. I reaffirmed with her that stopping treatment does not equal to giving up hope. Rather, hospice would mean we are directing focus on quality of life rather than quantity of life. In any case, with her significant pancytopenia, I will hold off treatment today and just provide IV fluids and supportive care. I recommend resumption of dexamethasone in view of recurrent hypercalcemia  Hypercalcemia This is due to poorly controlled multiple myeloma. I will proceed to add dexamethasone and IVF    Pancytopenia ([place]) Anemia is likely due to anemia of chronic disease and bone marrow infiltration from multiple myeloma. She is not  symptomatic. We will hold treatment for now.      Protein-calorie malnutrition, severe She has failure to thrive and lost some weight. I recommend she continue nutritional supplement as tolerated. She will be seen by a dietitian today.   Dysuria  She has frequent urination. The patient is intermittently confused. I suspect her confusion is due to hypercalcemia but I will proceed to check urinalysis and urine culture.   Orders Placed This Encounter  Procedures  . Urine culture    Standing Status: Future     Number of Occurrences: 1     Standing Expiration Date: 09/11/2016  . Urinalysis, Microscopic - [facility]    Standing Status: Future     Number of Occurrences: 1     Standing Expiration Date: 09/11/2016  . SPEP & IFE with QIG    Standing Status: Future     Number of Occurrences:      Standing Expiration Date: 09/11/2016  . Kappa/lambda light chains    Standing Status: Future     Number of Occurrences:      Standing Expiration Date: 09/11/2016   All questions were answered. The patient knows to call the clinic with any problems, questions or concerns. No barriers to learning was detected. I spent 30 minutes counseling the patient face to face. The total time spent in the appointment was 40 minutes and more than 50% was on counseling and review of test results     [facility], [place], MD 08/08/2015 1:28 PM

## 2015-08-08 NOTE — Assessment & Plan Note (Signed)
Anemia is likely due to anemia of chronic disease and bone marrow infiltration from multiple myeloma. She is not symptomatic. We will hold treatment for now.

## 2015-08-08 NOTE — Telephone Encounter (Signed)
Appointments complete per 11/9 pof. Patient will be given avs report and appointments in inf area.

## 2015-08-08 NOTE — Progress Notes (Signed)
Pt has been referred to palliative care.

## 2015-08-08 NOTE — Progress Notes (Signed)
Urine sample obtained from pt and sent to LAB. 1245: Patient woke up from nap complaining of left flank pain. Patient motioned to her left lower abdomen and left lower back. Patient tearful stating, "I don;t want to die." Patient assisted to bathroom;  pt had small soft bowl movement. Daughter called and stated that patient has not complained of pain prior to this. Selena Lesser NP called and assessed pt at bedside. Cyndee will consult with Dr. Alvy Bimler. 1307: Per C[place]o RN pt is free to go after her fluids infuse.

## 2015-08-08 NOTE — Assessment & Plan Note (Signed)
She has frequent urination. The patient is intermittently confused. I suspect her confusion is due to hypercalcemia but I will proceed to check urinalysis and urine culture.

## 2015-08-08 NOTE — Assessment & Plan Note (Signed)
She has failure to thrive and lost some weight. I recommend she continue nutritional supplement as tolerated. She will be seen by a dietitian today.

## 2015-08-08 NOTE — Assessment & Plan Note (Signed)
She has significant poor performance status. She continued to have progressive weight loss and intermittent confusion. She have recurrence of hypercalcemia. Frankly, I recommend we discontinue treatment and refer her to hospice. Her daughter has been reluctant to stop treatment because she felt that stopping treatment would mean giving up hope. I told her that immunotherapy does not appears to be working at present time although it is not confirmed without repeat serum protein electrophoresis and free light chains. I reaffirmed with her that stopping treatment does not equal to giving up hope. Rather, hospice would mean we are directing focus on quality of life rather than quantity of life. In any case, with her significant pancytopenia, I will hold off treatment today and just provide IV fluids and supportive care. I recommend resumption of dexamethasone in view of recurrent hypercalcemia

## 2015-08-08 NOTE — Assessment & Plan Note (Signed)
This is due to poorly controlled multiple myeloma. I will proceed to add dexamethasone and IVF

## 2015-08-09 ENCOUNTER — Telehealth: Payer: Self-pay | Admitting: *Deleted

## 2015-08-09 LAB — URINE CULTURE

## 2015-08-09 NOTE — Telephone Encounter (Signed)
LVM for daughter informing her of U/C negative and to please call nurse back if any questions.

## 2015-08-09 NOTE — Telephone Encounter (Signed)
-----   Message from Heath Lark, MD sent at 08/09/2015 11:34 AM EST ----- Regarding: urine culture neg   ----- Message -----    From: Lab in [facility] Interface    Sent: 08/08/2015  11:03 AM      To: Heath Lark, MD

## 2015-08-10 ENCOUNTER — Telehealth: Payer: Self-pay | Admitting: Hematology and Oncology

## 2015-08-10 NOTE — Telephone Encounter (Signed)
Called and left a message with new flush for 11/16

## 2015-08-13 ENCOUNTER — Telehealth: Payer: Self-pay | Admitting: *Deleted

## 2015-08-13 NOTE — Telephone Encounter (Signed)
Call from patient's daughter Ebony Hail reporting "collaborative nurse told them they do not need to come in for lab and flush because the results are not needed before the type of treatment they will receive.  We were told the infusion nurse can draw the labs.  Someone called with lab flush appointments so I do not think they're reading Cameo's notes.  Can someone call and let us know what we're supposed to do on Wed., 08-15-2015."  Return number [phone].

## 2015-08-13 NOTE — Telephone Encounter (Signed)
Daughter notified that since chemo had to be held due to neutropenia, pt needs to have labs drawn prior to treatment. Verbalized understanding

## 2015-08-15 ENCOUNTER — Ambulatory Visit: Payer: Medicare Other | Admitting: Nutrition

## 2015-08-15 ENCOUNTER — Other Ambulatory Visit: Payer: Self-pay

## 2015-08-15 ENCOUNTER — Other Ambulatory Visit (HOSPITAL_BASED_OUTPATIENT_CLINIC_OR_DEPARTMENT_OTHER): Payer: Medicare Other

## 2015-08-15 ENCOUNTER — Ambulatory Visit: Payer: Medicare Other

## 2015-08-15 ENCOUNTER — Encounter: Payer: Self-pay | Admitting: *Deleted

## 2015-08-15 ENCOUNTER — Ambulatory Visit (HOSPITAL_BASED_OUTPATIENT_CLINIC_OR_DEPARTMENT_OTHER): Payer: Medicare Other

## 2015-08-15 VITALS — BP 160/60 | HR 68 | Temp 98.9°F | Resp 20

## 2015-08-15 DIAGNOSIS — C9002 Multiple myeloma in relapse: Secondary | ICD-10-CM | POA: Diagnosis not present

## 2015-08-15 DIAGNOSIS — Z95828 Presence of other vascular implants and grafts: Secondary | ICD-10-CM

## 2015-08-15 DIAGNOSIS — Z5112 Encounter for antineoplastic immunotherapy: Secondary | ICD-10-CM

## 2015-08-15 DIAGNOSIS — R279 Unspecified lack of coordination: Secondary | ICD-10-CM | POA: Diagnosis not present

## 2015-08-15 LAB — CBC & DIFF AND RETIC
BASO%: 0 % (ref 0.0–2.0)
Basophils Absolute: 0 10*3/uL (ref 0.0–0.1)
EOS%: 0 % (ref 0.0–7.0)
Eosinophils Absolute: 0 10*3/uL (ref 0.0–0.5)
HCT: 28.1 % — ABNORMAL LOW (ref 34.8–46.6)
HGB: 8.8 g/dL — ABNORMAL LOW (ref 11.6–15.9)
Immature Retic Fract: 24.2 % — ABNORMAL HIGH (ref 1.60–10.00)
LYMPH%: 19.3 % (ref 14.0–49.7)
MCH: 30.6 pg (ref 25.1–34.0)
MCHC: 31.3 g/dL — ABNORMAL LOW (ref 31.5–36.0)
MCV: 97.6 fL (ref 79.5–101.0)
MONO#: 0.5 10*3/uL (ref 0.1–0.9)
MONO%: 14.3 % — AB (ref 0.0–14.0)
NEUT%: 66.4 % (ref 38.4–76.8)
NEUTROS ABS: 2.1 10*3/uL (ref 1.5–6.5)
Platelets: 190 10*3/uL (ref 145–400)
RBC: 2.88 10*6/uL — AB (ref 3.70–5.45)
RDW: 16.9 % — AB (ref 11.2–14.5)
RETIC %: 2.74 % — AB (ref 0.70–2.10)
Retic Ct Abs: 78.91 10*3/uL (ref 33.70–90.70)
WBC: 3.2 10*3/uL — AB (ref 3.9–10.3)
lymph#: 0.6 10*3/uL — ABNORMAL LOW (ref 0.9–3.3)

## 2015-08-15 LAB — COMPREHENSIVE METABOLIC PANEL (CC13)
ALT: 20 U/L (ref 0–55)
AST: 17 U/L (ref 5–34)
Albumin: 2.4 g/dL — ABNORMAL LOW (ref 3.5–5.0)
Alkaline Phosphatase: 56 U/L (ref 40–150)
Anion Gap: 8 mEq/L (ref 3–11)
BUN: 20.7 mg/dL (ref 7.0–26.0)
CHLORIDE: 105 meq/L (ref 98–109)
CO2: 25 meq/L (ref 22–29)
Calcium: 9.9 mg/dL (ref 8.4–10.4)
Creatinine: 0.7 mg/dL (ref 0.6–1.1)
GLUCOSE: 77 mg/dL (ref 70–140)
POTASSIUM: 3.8 meq/L (ref 3.5–5.1)
SODIUM: 137 meq/L (ref 136–145)
Total Bilirubin: 0.47 mg/dL (ref 0.20–1.20)
Total Protein: 9.3 g/dL — ABNORMAL HIGH (ref 6.4–8.3)

## 2015-08-15 MED ORDER — DIPHENHYDRAMINE HCL 25 MG PO CAPS
50.0000 mg | ORAL_CAPSULE | Freq: Once | ORAL | Status: AC
  Administered 2015-08-15: 50 mg via ORAL

## 2015-08-15 MED ORDER — METHYLPREDNISOLONE SODIUM SUCC 125 MG IJ SOLR
INTRAMUSCULAR | Status: AC
  Filled 2015-08-15: qty 2

## 2015-08-15 MED ORDER — ACETAMINOPHEN 325 MG PO TABS
650.0000 mg | ORAL_TABLET | Freq: Once | ORAL | Status: AC
  Administered 2015-08-15: 650 mg via ORAL

## 2015-08-15 MED ORDER — ACETAMINOPHEN 325 MG PO TABS
ORAL_TABLET | ORAL | Status: AC
  Filled 2015-08-15: qty 2

## 2015-08-15 MED ORDER — DARATUMUMAB CHEMO INJECTION 400 MG/20ML
16.0000 mg/kg | Freq: Once | INTRAVENOUS | Status: AC
  Administered 2015-08-15: 700 mg via INTRAVENOUS
  Filled 2015-08-15: qty 10

## 2015-08-15 MED ORDER — SODIUM CHLORIDE 0.9 % IJ SOLN
10.0000 mL | INTRAMUSCULAR | Status: DC | PRN
  Administered 2015-08-15: 10 mL via INTRAVENOUS
  Filled 2015-08-15: qty 10

## 2015-08-15 MED ORDER — SODIUM CHLORIDE 0.9 % IV SOLN
Freq: Once | INTRAVENOUS | Status: AC
  Administered 2015-08-15: 10:00:00 via INTRAVENOUS
  Filled 2015-08-15: qty 4

## 2015-08-15 MED ORDER — DIPHENHYDRAMINE HCL 25 MG PO CAPS
ORAL_CAPSULE | ORAL | Status: AC
  Filled 2015-08-15: qty 2

## 2015-08-15 MED ORDER — SODIUM CHLORIDE 0.9 % IJ SOLN
10.0000 mL | INTRAMUSCULAR | Status: DC | PRN
  Administered 2015-08-15: 10 mL
  Filled 2015-08-15: qty 10

## 2015-08-15 MED ORDER — METHYLPREDNISOLONE SODIUM SUCC 125 MG IJ SOLR
60.0000 mg | Freq: Once | INTRAMUSCULAR | Status: AC
  Administered 2015-08-15: 60 mg via INTRAVENOUS

## 2015-08-15 MED ORDER — HEPARIN SOD (PORK) LOCK FLUSH 100 UNIT/ML IV SOLN
500.0000 [IU] | Freq: Once | INTRAVENOUS | Status: AC | PRN
  Administered 2015-08-15: 500 [IU]
  Filled 2015-08-15: qty 5

## 2015-08-15 NOTE — Patient Instructions (Signed)

## 2015-08-15 NOTE — Progress Notes (Signed)
Per Dr. Alvy Bimler, may treat today without having CMET results. Charge nurse notified.

## 2015-08-15 NOTE — Progress Notes (Signed)
Nutrition follow-up completed with patient and daughter, during infusion for multiple myeloma. Weight decreased and documented as 93.5 pounds November 9 decreased from 94.25 pounds October 26. Patient reports continued good appetite and oral intake. Patient consuming one oral nutrition supplement daily. Patient denies nutrition impact symptoms.  Nutrition diagnosis:  Inadequate oral intake continues. Severe malnutrition continues.  Intervention:  Provided additional samples of boost compact and ensure compact and encouraged patient to increase to 3-4 bottles daily. Provided additional coupons. Encouraged increased consumption of high protein, high calorie foods. Teach back method used.  Monitoring, evaluation, goals: Patient will work to increase calories and protein, and consume oral nutrition supplements 3 times a day.  Next visit: Wednesday, November 23, during infusion.  **Disclaimer: This note was dictated with voice recognition software. Similar sounding words can inadvertently be transcribed and this note may contain transcription errors which may not have been corrected upon publication of note.**

## 2015-08-15 NOTE — Patient Instructions (Signed)
[facility] Discharge Instructions for Patients Receiving Chemotherapy  Today you received the following chemotherapy agents Darzalex.  To help prevent nausea and vomiting after your treatment, we encourage you to take your nausea medication as directed.  If you develop nausea and vomiting that is not controlled by your nausea medication, call the clinic.   BELOW ARE SYMPTOMS THAT SHOULD BE REPORTED IMMEDIATELY:  *FEVER GREATER THAN 100.5 F  *CHILLS WITH OR WITHOUT FEVER  NAUSEA AND VOMITING THAT IS NOT CONTROLLED WITH YOUR NAUSEA MEDICATION  *UNUSUAL SHORTNESS OF BREATH  *UNUSUAL BRUISING OR BLEEDING  TENDERNESS IN MOUTH AND THROAT WITH OR WITHOUT PRESENCE OF ULCERS  *URINARY PROBLEMS  *BOWEL PROBLEMS  UNUSUAL RASH Items with * indicate a potential emergency and should be followed up as soon as possible.  Feel free to call the clinic you have any questions or concerns. The clinic phone number is [phone].  Please show the CHEMO ALERT CARD at check-in to the Emergency Department and triage nurse.    

## 2015-08-16 ENCOUNTER — Telehealth: Payer: Self-pay | Admitting: *Deleted

## 2015-08-16 NOTE — Telephone Encounter (Signed)
Left message for daughter requesting family member accompany patient to next chemo treatment.

## 2015-08-21 ENCOUNTER — Telehealth: Payer: Self-pay | Admitting: *Deleted

## 2015-08-21 NOTE — Telephone Encounter (Signed)
Daughter called to cancel appt for 11/23. States she is not able to stay with patient during treatment. Has a brother, but states he will not stay. Daughter was upset about being asked to have someone stay. Explained that for patient's safety ( pt is confused) it is best for her to have family/friend stay with her for treatment. Dr Alvy Bimler agrees that family needs to be with patient.

## 2015-08-22 ENCOUNTER — Ambulatory Visit: Payer: Self-pay

## 2015-08-22 ENCOUNTER — Other Ambulatory Visit: Payer: Self-pay

## 2015-08-22 ENCOUNTER — Encounter: Payer: Self-pay | Admitting: Nutrition

## 2015-08-24 ENCOUNTER — Emergency Department (HOSPITAL_COMMUNITY): Payer: Medicare Other

## 2015-08-24 ENCOUNTER — Encounter (HOSPITAL_COMMUNITY): Payer: Self-pay | Admitting: Emergency Medicine

## 2015-08-24 ENCOUNTER — Telehealth: Payer: Self-pay | Admitting: *Deleted

## 2015-08-24 ENCOUNTER — Observation Stay (HOSPITAL_COMMUNITY)
Admission: EM | Admit: 2015-08-24 | Discharge: 2015-08-28 | Disposition: A | Discharge status: Skilled Nursing Facility | Payer: Medicare Other | Attending: Family Medicine | Admitting: Family Medicine

## 2015-08-24 DIAGNOSIS — I10 Essential (primary) hypertension: Secondary | ICD-10-CM | POA: Diagnosis not present

## 2015-08-24 DIAGNOSIS — E860 Dehydration: Secondary | ICD-10-CM | POA: Insufficient documentation

## 2015-08-24 DIAGNOSIS — F062 Psychotic disorder with delusions due to known physiological condition: Secondary | ICD-10-CM | POA: Diagnosis present

## 2015-08-24 DIAGNOSIS — G4700 Insomnia, unspecified: Secondary | ICD-10-CM | POA: Diagnosis not present

## 2015-08-24 DIAGNOSIS — Z880 Allergy status to penicillin: Secondary | ICD-10-CM | POA: Insufficient documentation

## 2015-08-24 DIAGNOSIS — Z886 Allergy status to analgesic agent status: Secondary | ICD-10-CM | POA: Diagnosis not present

## 2015-08-24 DIAGNOSIS — Z681 Body mass index (BMI) 19 or less, adult: Secondary | ICD-10-CM | POA: Diagnosis not present

## 2015-08-24 DIAGNOSIS — C9002 Multiple myeloma in relapse: Secondary | ICD-10-CM | POA: Diagnosis not present

## 2015-08-24 DIAGNOSIS — Z91040 Latex allergy status: Secondary | ICD-10-CM | POA: Diagnosis not present

## 2015-08-24 DIAGNOSIS — F419 Anxiety disorder, unspecified: Secondary | ICD-10-CM | POA: Insufficient documentation

## 2015-08-24 DIAGNOSIS — D638 Anemia in other chronic diseases classified elsewhere: Secondary | ICD-10-CM | POA: Insufficient documentation

## 2015-08-24 DIAGNOSIS — I493 Ventricular premature depolarization: Secondary | ICD-10-CM | POA: Insufficient documentation

## 2015-08-24 DIAGNOSIS — F068 Other specified mental disorders due to known physiological condition: Secondary | ICD-10-CM | POA: Insufficient documentation

## 2015-08-24 DIAGNOSIS — Z888 Allergy status to other drugs, medicaments and biological substances status: Secondary | ICD-10-CM | POA: Diagnosis not present

## 2015-08-24 DIAGNOSIS — R4182 Altered mental status, unspecified: Secondary | ICD-10-CM | POA: Diagnosis present

## 2015-08-24 DIAGNOSIS — E43 Unspecified severe protein-calorie malnutrition: Secondary | ICD-10-CM | POA: Insufficient documentation

## 2015-08-24 DIAGNOSIS — G9341 Metabolic encephalopathy: Secondary | ICD-10-CM | POA: Diagnosis not present

## 2015-08-24 DIAGNOSIS — Z881 Allergy status to other antibiotic agents status: Secondary | ICD-10-CM | POA: Diagnosis not present

## 2015-08-24 LAB — VITAMIN B12: Vitamin B-12: 596 pg/mL (ref 180–914)

## 2015-08-24 LAB — URINE MICROSCOPIC-ADD ON

## 2015-08-24 LAB — COMPREHENSIVE METABOLIC PANEL
ALT: 21 U/L (ref 14–54)
ANION GAP: 5 (ref 5–15)
AST: 44 U/L — AB (ref 15–41)
Albumin: 2.5 g/dL — ABNORMAL LOW (ref 3.5–5.0)
Alkaline Phosphatase: 44 U/L (ref 38–126)
BILIRUBIN TOTAL: 0.4 mg/dL (ref 0.3–1.2)
BUN: 33 mg/dL — AB (ref 6–20)
CHLORIDE: 107 mmol/L (ref 101–111)
CO2: 27 mmol/L (ref 22–32)
Calcium: 9.4 mg/dL (ref 8.9–10.3)
Creatinine, Ser: 0.76 mg/dL (ref 0.44–1.00)
GFR calc Af Amer: >60 mL/min (ref >60)
Glucose, Bld: 85 mg/dL (ref 65–99)
POTASSIUM: 3.9 mmol/L (ref 3.5–5.1)
Sodium: 139 mmol/L (ref 135–145)
TOTAL PROTEIN: 7.9 g/dL (ref 6.5–8.1)

## 2015-08-24 LAB — CBC
HEMATOCRIT: 27.4 % — AB (ref 36.0–46.0)
HEMOGLOBIN: 8.6 g/dL — AB (ref 12.0–15.0)
MCH: 30.5 pg (ref 26.0–34.0)
MCHC: 31.4 g/dL (ref 30.0–36.0)
MCV: 97.2 fL (ref 78.0–100.0)
Platelets: 70 10*3/uL — ABNORMAL LOW (ref 150–400)
RBC: 2.82 MIL/uL — AB (ref 3.87–5.11)
RDW: 17.5 % — ABNORMAL HIGH (ref 11.5–15.5)
WBC: 2 10*3/uL — AB (ref 4.0–10.5)

## 2015-08-24 LAB — I-STAT TROPONIN, ED: Troponin i, poc: 0.02 ng/mL (ref 0.00–0.08)

## 2015-08-24 LAB — URINALYSIS, ROUTINE W REFLEX MICROSCOPIC
Bilirubin Urine: NEGATIVE
GLUCOSE, UA: NEGATIVE mg/dL
KETONES UR: NEGATIVE mg/dL
LEUKOCYTES UA: NEGATIVE
NITRITE: NEGATIVE
PH: 5.5 (ref 5.0–8.0)
Protein, ur: 100 mg/dL — AB
SPECIFIC GRAVITY, URINE: 1.024 (ref 1.005–1.030)

## 2015-08-24 LAB — FOLATE: Folate: 25 ng/mL (ref >5.9)

## 2015-08-24 LAB — PHOSPHORUS: PHOSPHORUS: 2.1 mg/dL — AB (ref 2.5–4.6)

## 2015-08-24 LAB — MAGNESIUM: MAGNESIUM: 2 mg/dL (ref 1.7–2.4)

## 2015-08-24 LAB — AMMONIA: AMMONIA: 21 umol/L (ref 9–35)

## 2015-08-24 MED ORDER — SODIUM CHLORIDE 0.9 % IJ SOLN
10.0000 mL | INTRAMUSCULAR | Status: DC | PRN
  Administered 2015-08-28 (×3): 10 mL
  Filled 2015-08-24 (×3): qty 40

## 2015-08-24 MED ORDER — PROMETHAZINE HCL 25 MG PO TABS: 12.5000 mg | ORAL_TABLET | Freq: Four times a day (QID) | ORAL | Status: DC | PRN

## 2015-08-24 MED ORDER — ACYCLOVIR 400 MG PO TABS
400.0000 mg | ORAL_TABLET | Freq: Every day | ORAL | Status: DC
  Administered 2015-08-27: 400 mg via ORAL
  Filled 2015-08-24 (×3): qty 1

## 2015-08-24 MED ORDER — CHLORHEXIDINE GLUCONATE 0.12 % MT SOLN
15.0000 mL | Freq: Two times a day (BID) | OROMUCOSAL | Status: DC
  Administered 2015-08-25 – 2015-08-27 (×4): 15 mL via OROMUCOSAL
  Filled 2015-08-24 (×7): qty 15

## 2015-08-24 MED ORDER — ACETAMINOPHEN 325 MG PO TABS
650.0000 mg | ORAL_TABLET | Freq: Four times a day (QID) | ORAL | Status: DC | PRN
  Administered 2015-08-25 – 2015-08-28 (×2): 650 mg via ORAL
  Filled 2015-08-24 (×2): qty 2

## 2015-08-24 MED ORDER — HEPARIN SODIUM (PORCINE) 5000 UNIT/ML IJ SOLN
5000.0000 [IU] | Freq: Three times a day (TID) | INTRAMUSCULAR | Status: DC
  Administered 2015-08-25 – 2015-08-28 (×10): 5000 [IU] via SUBCUTANEOUS
  Filled 2015-08-24 (×12): qty 1

## 2015-08-24 MED ORDER — CETYLPYRIDINIUM CHLORIDE 0.05 % MT LIQD
7.0000 mL | Freq: Two times a day (BID) | OROMUCOSAL | Status: DC
  Administered 2015-08-27 – 2015-08-28 (×2): 7 mL via OROMUCOSAL

## 2015-08-24 MED ORDER — SODIUM CHLORIDE 0.9 % IJ SOLN
3.0000 mL | Freq: Two times a day (BID) | INTRAMUSCULAR | Status: DC
  Administered 2015-08-27: 3 mL via INTRAVENOUS

## 2015-08-24 MED ORDER — KCL IN DEXTROSE-NACL 20-5-0.45 MEQ/L-%-% IV SOLN
INTRAVENOUS | Status: DC
  Administered 2015-08-24 – 2015-08-28 (×8): via INTRAVENOUS
  Filled 2015-08-24 (×10): qty 1000

## 2015-08-24 MED ORDER — DEXAMETHASONE 4 MG PO TABS
4.0000 mg | ORAL_TABLET | Freq: Every day | ORAL | Status: DC
  Administered 2015-08-24 – 2015-08-27 (×2): 4 mg via ORAL
  Filled 2015-08-24 (×5): qty 1

## 2015-08-24 MED ORDER — ACETAMINOPHEN 650 MG RE SUPP: 650.0000 mg | Freq: Four times a day (QID) | RECTAL | Status: DC | PRN

## 2015-08-24 MED ORDER — TOBRAMYCIN 0.3 % OP SOLN
1.0000 [drp] | OPHTHALMIC | Status: DC
  Filled 2015-08-24: qty 5

## 2015-08-24 NOTE — Telephone Encounter (Signed)
FYI 1221 Voicemail retrieved from patient's son Lanora Manis by Triage nurse.  This nurse received page at 1226 pm.  Called [phone] at 1230.  "Brittanya is almost unresponsive, not eating, falling backwards when up but she hasn't fallen.  Could Dr. Alvy Bimler get a bed assigned for admission?   Advised they call 911 due to decreased response.  "EMS is here now."

## 2015-08-24 NOTE — ED Notes (Signed)
Pt from home. Hx of multiple myeloma. Family reports pt has had altered mental status since she woke up 4 hours ago. No hx dementia, but has had a gradual decrease in level of functioning with her multiple myeloma. No recent falls. Pt oriented to self only at home; will follow commands. Pt sleeping on arrival. Family noticed pt's urine smelled strong a few days ago.

## 2015-08-24 NOTE — ED Notes (Signed)
Delay lab draw, pt in radiology

## 2015-08-24 NOTE — ED Notes (Signed)
Bed: [facility] Expected date:  Expected time:  Means of arrival:  Comments: 

## 2015-08-24 NOTE — ED Notes (Signed)
Pt gone to CT 

## 2015-08-24 NOTE — H&P (Signed)
[facility] History and Physical  [name] YCX:[id number] DOB: 1932-01-25 DOA: 08/24/2015  Referring physician: ED PA PCP: Alvy Bimler NI, MD   Chief Complaint: Confusion  HPI: Victoria [name] is a 81 y.o. female  With history of multiple myeloma being followed by Dr.Gorsuch, presents to the hospital after some reports patient was having increasing confusion for the last 3 days which has been progressively getting worse. The problem has been persistent since onset. The patient has been eating less and less reportedly and today has not eating all that well. She also had difficulty working with physical therapy and reportedly was letting herself fall backwards. Much of the history is obtained from EMR and family at bedside as patient is not able to provide any history.   Review of Systems:  Unable to assess due to confusion  Past Medical History  Diagnosis Date  . Hypertension   . Insomnia   . Anxiety   . Anemia   . Multiple myeloma     In remission  . Diverticulosis 04/2001  . Perforation bowel ([place])   . Ringing in ears     Wears hearing aides to drown out   . Rectal bleeding 02/03/2014  . Anemia in chronic illness 01/24/2015   Past Surgical History  Procedure Laterality Date  . Sigmoid resection / rectopexy  01/2011    perforation/stoma  . Colonoscopy    . Partial hysterectomy    . Cesarean section    . Colostomy takedown  06/27/11  . Cholecystectomy  2012    laparoscopic   Social History:  reports that she has never smoked. She has never used smokeless tobacco. She reports that she does not drink alcohol or use illicit drugs.  Allergies  Allergen Reactions  . Amoxicillin Hives and Itching    Has patient had a PCN reaction causing immediate rash, facial/tongue/throat swelling, SOB or lightheadedness with hypotension: No Has patient had a PCN reaction causing severe rash involving mucus membranes or skin necrosis: No Has patient had a PCN reaction that required  hospitalization No Has patient had a PCN reaction occurring within the last 10 years: No If all of the above answers are "NO", then may proceed with Cephalosporin use.  . Aspirin Other (See Comments)    Child hood allergy   . Atenolol     Hair loss  . Donepezil Other (See Comments)    agitation   . Doxycycline     Altered mental status   . Nortriptyline     agitation  . Amiodarone Rash  . Latex Dermatitis  . Zoloft [Sertraline Hcl] Anxiety    Increased agitation    Family History  Problem Relation Age of Onset  . Hypertension Other   . Prostate cancer Father   . Colitis Neg Hx   . Esophageal cancer Neg Hx   . Stomach cancer Neg Hx      Prior to Admission medications   Medication Sig Start Date End Date Taking? Authorizing Provider  acyclovir (ZOVIRAX) 400 MG tablet Take 1 tablet (400 mg total) by mouth daily. 07/02/15  Yes Heath Lark, MD  cholecalciferol (VITAMIN D) 1000 UNITS tablet Take 2,000 Units by mouth daily.   Yes Historical Provider, MD  dexamethasone (DECADRON) 4 MG tablet Take 1 tablet (4 mg total) by mouth daily. 07/25/15  Yes Heath Lark, MD  lidocaine-prilocaine (EMLA) cream Apply to affected area once 07/02/15  Yes Heath Lark, MD  Multiple Vitamins-Minerals (MULTIVITAMIN & MINERAL PO) Take 1 tablet by  mouth daily.   Yes Historical Provider, MD  promethazine (PHENERGAN) 25 MG tablet Take 0.5 tablets (12.5 mg total) by mouth every 6 (six) hours as needed for nausea. 02/05/15  Yes Heath Lark, MD  tobramycin (TOBREX) 0.3 % ophthalmic solution Place 1 drop into the left eye every 4 (four) hours. 07/18/15  Yes Heath Lark, MD  traMADol (ULTRAM) 50 MG tablet Take 1 tablet (50 mg total) by mouth every 6 (six) hours as needed. 05/31/15  Yes Estela Leonie Green, MD  LORazepam (ATIVAN) 1 MG tablet Take 1 tablet (1 mg total) by mouth every 8 (eight) hours as needed for anxiety. 07/10/15   Cassandria Anger, MD   Physical Exam: Filed Vitals:   08/24/15 1325 08/24/15 1333  08/24/15 1602  BP:  152/73 141/76  Pulse:  58 54  Temp:  97.6 F (36.4 C)   TempSrc:  Oral   Resp:  22 12  SpO2: 96% 97% 95%    Wt Readings from Last 3 Encounters:  08/08/15 42.411 kg (93 lb 8 oz)  07/25/15 42.752 kg (94 lb 4 oz)  07/02/15 43.092 kg (95 lb)    General:  Limited cooperation with examiner. Patient appearing cachectic. In no acute distress Eyes: PERRL, normal lids, irises & conjunctiva ENT: grossly normal hearing, lips & tongue, dry mucous membranes Neck: no LAD, masses or thyromegaly Cardiovascular: RRR, no m/r/g. No LE edema. Respiratory: CTA bilaterally, no w/r/r. Normal respiratory effort. Abdomen: soft, ntnd Skin: no rash or induration seen on limited exam Musculoskeletal: Tone equal bilaterally Psychiatric: Unable to assess secondary to confusion and limited cooperation with examiner Neurologic: Unable to accurately assess due to limited cooperation with examiner. No facial asymmetry           Labs on Admission:  Basic Metabolic Panel:  Recent Labs Lab 08/24/15 1408  NA 139  K 3.9  CL 107  CO2 27  GLUCOSE 85  BUN 33*  CREATININE 0.76  CALCIUM 9.4   Liver Function Tests:  Recent Labs Lab 08/24/15 1408  AST 44*  ALT 21  ALKPHOS 44  BILITOT 0.4  PROT 7.9  ALBUMIN 2.5*   No results for input(s): LIPASE, AMYLASE in the last 168 hours. No results for input(s): AMMONIA in the last 168 hours. CBC:  Recent Labs Lab 08/24/15 1408  WBC 2.0*  HGB 8.6*  HCT 27.4*  MCV 97.2  PLT 70*   Cardiac Enzymes: No results for input(s): CKTOTAL, CKMB, CKMBINDEX, TROPONINI in the last 168 hours.  BNP (last 3 results) No results for input(s): BNP in the last 8760 hours.  ProBNP (last 3 results) No results for input(s): PROBNP in the last 8760 hours.  CBG: No results for input(s): GLUCAP in the last 168 hours.  Radiological Exams on Admission: Ct Head Wo Contrast  08/24/2015  CLINICAL DATA:  Altered mental status.  Multiple myeloma EXAM: CT  HEAD WITHOUT CONTRAST TECHNIQUE: Contiguous axial images were obtained from the base of the skull through the vertex without intravenous contrast. COMPARISON:  CT head 06/15/2015 FINDINGS: Generalized atrophy. Hypodensity in the cerebral white matter bilaterally unchanged compatible chronic microvascular ischemia. Negative for acute infarct.  Negative for hemorrhage or mass. Multiple low-density lesions throughout the calvarium consistent with multiple myeloma. These are unchanged. IMPRESSION: Atrophy and chronic microvascular ischemia.  No acute abnormality. Electronically Signed   By: Franchot Gallo M.D.   On: 08/24/2015 15:32   Dg Chest Portable 1 View  08/24/2015  CLINICAL DATA:  Altered mental status  today upon waking up. Loss of appetite for 2 days. Headache. EXAM: PORTABLE CHEST 1 VIEW COMPARISON:  06/14/2015 FINDINGS: Power injectable Port-A-Cath tip: 3 vertebral body levels below the carina, just into the right atrium. Tortuosity and atherosclerotic calcification of the aortic arch. Reverse lordotic projection with low lung volumes. Bibasilar subsegmental atelectasis or scarring. Stable mildly indistinct pulmonary vasculature. Upper normal heart size. No overt edema. IMPRESSION: 1. Low lung volumes with mild bibasilar atelectasis or scarring. 2. Atherosclerotic aortic arch. 3. Right power injectable Port-A-Cath tip: Just into the right atrium, 3 vertebral levels below the carina. Electronically Signed   By: Van Clines M.D.   On: 08/24/2015 15:16   Dg Abd Portable 1v  08/24/2015  CLINICAL DATA:  81 year old female with multiple myeloma and altered mental status. EXAM: PORTABLE ABDOMEN - 1 VIEW COMPARISON:  None. FINDINGS: Nonobstructed bowel gas pattern. Colonic stool burden does not appear excessive. Multifocal lytic lesions throughout the skeletal structures the largest of which has largely replaced the right iliac wing. Findings are consistent with the clinical history of multiple myeloma.  No large free air on this single supine radiograph. No abnormal calcifications. IMPRESSION: 1. Unremarkable bowel gas pattern. 2. Known multiple myeloma with large lytic lesion replacing the majority of the right iliac wing. Electronically Signed   By: Jacqulynn Cadet M.D.   On: 08/24/2015 16:44    EKG: Independently reviewed. Sinus bradycardia with no ST elevations or depressions  Assessment/Plan Principal Problem:   Metabolic encephalopathy - At this juncture the only positive finding on workup is dehydration. Chest x-ray and urinalysis did not report any signs or source of infection. - We'll obtain vitamin B 12, folate, HIV, RPR, ammonia levels - However patient on review of prior problem list has documented psychotic disorder due to medical condition with delusions. If workup is negative she may require psychiatric consultation. - Dehydration and poor oral intake may be playing a role as such will rehydrate and liberalize diet. - d/c tramadol and treat pain with Acetaminophen  Active Problems   Dehydration - Will place on maintenance IV fluids. Once patient is eating and drinking we'll plan on discontinuing her IV fluids.  MM - Will consider reaching out to oncology if work up is negative. Reportedly patient had immunotherapy last week - Calcium level at 9.4  Code Status: full DVT Prophylaxis: Heparin Family Communication: discussed with children at bedside Disposition Plan: Pending work up  Time spent: > 45 minutes  [name] [facility] Pager [phone]

## 2015-08-24 NOTE — ED Provider Notes (Signed)
CSN: [id number]     Arrival date & time 08/24/15  1313 History   First MD Initiated Contact with Patient 08/24/15 1421     Chief Complaint  Patient presents with  . Altered Mental Status    HPI  Victoria [name] is an 81 y.o. female with history of multiple myeloma, anxiety, HTN, chronic anemia who presents to the ED for evaluation of AMS. She is accompanied by her son who provides her history. Her son reports that pt has been overall declining over the past year. However he has noticed in the last two days she has been significantly more confused than baseline, at times not knowing where she is. Pt has also reportedly seemed much weaker than usual. Son states that at baseline she walks on her own using a cane or walker but today she was standing and started to fall backwards. Pt's son states that he caught her and she did not hit her head. Denies LOC. Pt has also reportedly been having looser stools than normal. Family has not noticed any blood. Pt also has reportedly had decreased appetite and has not been drinking/eating as much as usual. The family is unsure if pt has had any fever at home.  In the ED today pt is drowsy, subdued. She is arousable, however, and responsive to my commands/questions. She shrugs when I ask her what is wrong. She shrugs when I ask orientation questions but can nod when I get to the correct year and location. She wiggles her toes and squeezes my hand on command but weakly. Her vitals are unremarkable. Last non-con head CT was in 05/2015 for confusion and showed no acute changes at that time. Pt is not on anticoagulation.  Past Medical History  Diagnosis Date  . Hypertension   . Insomnia   . Anxiety   . Anemia   . Multiple myeloma     In remission  . Diverticulosis 04/2001  . Perforation bowel ([place])   . Ringing in ears     Wears hearing aides to drown out   . Rectal bleeding 02/03/2014  . Anemia in chronic illness 01/24/2015   Past Surgical History  Procedure  Laterality Date  . Sigmoid resection / rectopexy  01/2011    perforation/stoma  . Colonoscopy    . Partial hysterectomy    . Cesarean section    . Colostomy takedown  06/27/11  . Cholecystectomy  2012    laparoscopic   Family History  Problem Relation Age of Onset  . Hypertension Other   . Prostate cancer Father   . Colitis Neg Hx   . Esophageal cancer Neg Hx   . Stomach cancer Neg Hx    Social History  Substance Use Topics  . Smoking status: Never Smoker   . Smokeless tobacco: Never Used  . Alcohol Use: No   OB History    No data available     Review of Systems  Unable to perform ROS: Mental status change      Allergies  Amoxicillin; Aspirin; Atenolol; Donepezil; Doxycycline; Nortriptyline; Amiodarone; Latex; and Zoloft  Home Medications   Prior to Admission medications   Medication Sig Start Date End Date Taking? Authorizing Provider  lidocaine-prilocaine (EMLA) cream Apply to affected area once 07/02/15  Yes Heath Lark, MD  acyclovir (ZOVIRAX) 400 MG tablet Take 1 tablet (400 mg total) by mouth daily. 07/02/15   Heath Lark, MD  cholecalciferol (VITAMIN D) 1000 UNITS tablet Take 2,000 Units by mouth daily.  Historical Provider, MD  dexamethasone (DECADRON) 4 MG tablet Take 1 tablet (4 mg total) by mouth daily. 07/25/15   Heath Lark, MD  LORazepam (ATIVAN) 1 MG tablet Take 1 tablet (1 mg total) by mouth every 8 (eight) hours as needed for anxiety. 07/10/15   Aleksei Plotnikov V, MD  Multiple Vitamins-Minerals (MULTIVITAMIN & MINERAL PO) Take 1 tablet by mouth daily.    Historical Provider, MD  promethazine (PHENERGAN) 25 MG tablet Take 0.5 tablets (12.5 mg total) by mouth every 6 (six) hours as needed for nausea. 02/05/15   Heath Lark, MD  tobramycin (TOBREX) 0.3 % ophthalmic solution Place 1 drop into the left eye every 4 (four) hours. 07/18/15   Heath Lark, MD  traMADol (ULTRAM) 50 MG tablet Take 1 tablet (50 mg total) by mouth every 6 (six) hours as needed. 05/31/15    Erline Hau, MD   BP 152/73 mmHg  Pulse 58  Temp(Src) 97.6 F (36.4 C) (Oral)  Resp 22  SpO2 97% Physical Exam  Constitutional:  Pt appears thin, chronically ill  HENT:  Right Ear: External ear normal.  Left Ear: External ear normal.  Nose: Nose normal.  Mouth/Throat: Oropharynx is clear and moist. No posterior oropharyngeal edema or posterior oropharyngeal erythema.  Eyes: Conjunctivae and EOM are normal. Pupils are equal, round, and reactive to light.  Neck: Normal range of motion. Neck supple.  Cardiovascular: Normal rate, regular rhythm and normal heart sounds.   Pulmonary/Chest: Effort normal and breath sounds normal. No respiratory distress. She has no wheezes.  Abdominal: Soft. Bowel sounds are normal. She exhibits no distension. There is no rebound and no guarding.  Mild diffuse abdominal ttp  Musculoskeletal: She exhibits no edema.  Neurological:  Oriented to year and place. Responsive to my commands but generalized decreased strength. Pt is awake but drowsy.  Skin: Skin is warm and dry. She is not diaphoretic.  Psychiatric: She has a normal mood and affect.  Nursing note and vitals reviewed.   ED Course  Procedures (including critical care time) Labs Review Labs Reviewed  COMPREHENSIVE METABOLIC PANEL - Abnormal; Notable for the following:    BUN 33 (*)    Albumin 2.5 (*)    AST 44 (*)    All other components within normal limits  CBC - Abnormal; Notable for the following:    WBC 2.0 (*)    RBC 2.82 (*)    Hemoglobin 8.6 (*)    HCT 27.4 (*)    RDW 17.5 (*)    Platelets 70 (*)    All other components within normal limits  URINALYSIS, ROUTINE W REFLEX MICROSCOPIC (NOT AT [facility]) - Abnormal; Notable for the following:    APPearance CLOUDY (*)    Hgb urine dipstick TRACE (*)    Protein, ur 100 (*)    All other components within normal limits  URINE MICROSCOPIC-ADD ON - Abnormal; Notable for the following:    Squamous Epithelial / LPF 0-5 (*)     Bacteria, UA FEW (*)    Casts GRANULAR CAST (*)    All other components within normal limits  I-STAT TROPOININ, ED    Imaging Review Ct Head Wo Contrast  08/24/2015  CLINICAL DATA:  Altered mental status.  Multiple myeloma EXAM: CT HEAD WITHOUT CONTRAST TECHNIQUE: Contiguous axial images were obtained from the base of the skull through the vertex without intravenous contrast. COMPARISON:  CT head 06/15/2015 FINDINGS: Generalized atrophy. Hypodensity in the cerebral white matter bilaterally unchanged compatible chronic  microvascular ischemia. Negative for acute infarct.  Negative for hemorrhage or mass. Multiple low-density lesions throughout the calvarium consistent with multiple myeloma. These are unchanged. IMPRESSION: Atrophy and chronic microvascular ischemia.  No acute abnormality. Electronically Signed   By: Franchot Gallo M.D.   On: 08/24/2015 15:32   Dg Chest Portable 1 View  08/24/2015  CLINICAL DATA:  Altered mental status today upon waking up. Loss of appetite for 2 days. Headache. EXAM: PORTABLE CHEST 1 VIEW COMPARISON:  06/14/2015 FINDINGS: Power injectable Port-A-Cath tip: 3 vertebral body levels below the carina, just into the right atrium. Tortuosity and atherosclerotic calcification of the aortic arch. Reverse lordotic projection with low lung volumes. Bibasilar subsegmental atelectasis or scarring. Stable mildly indistinct pulmonary vasculature. Upper normal heart size. No overt edema. IMPRESSION: 1. Low lung volumes with mild bibasilar atelectasis or scarring. 2. Atherosclerotic aortic arch. 3. Right power injectable Port-A-Cath tip: Just into the right atrium, 3 vertebral levels below the carina. Electronically Signed   By: Van Clines M.D.   On: 08/24/2015 15:16   Dg Abd Portable 1v  08/24/2015  CLINICAL DATA:  81 year old female with multiple myeloma and altered mental status. EXAM: PORTABLE ABDOMEN - 1 VIEW COMPARISON:  None. FINDINGS: Nonobstructed bowel gas pattern.  Colonic stool burden does not appear excessive. Multifocal lytic lesions throughout the skeletal structures the largest of which has largely replaced the right iliac wing. Findings are consistent with the clinical history of multiple myeloma. No large free air on this single supine radiograph. No abnormal calcifications. IMPRESSION: 1. Unremarkable bowel gas pattern. 2. Known multiple myeloma with large lytic lesion replacing the majority of the right iliac wing. Electronically Signed   By: Jacqulynn Cadet M.D.   On: 08/24/2015 16:44   I have personally reviewed and evaluated these images and lab results as part of my medical decision-making.   EKG Interpretation   Date/Time:  Friday August 24 2015 14:36:19 EST Ventricular Rate:  55 PR Interval:  136 QRS Duration: 89 QT Interval:  446 QTC Calculation: 427 R Axis:   44 Text Interpretation:  Sinus rhythm Borderline T abnormalities, lateral  leads Baseline wander in lead(s) V5 similar to prior EKG Confirmed by  BELFI  MD, MELANIE ([id number]) on 08/24/2015 2:52:37 PM      MDM   Final diagnoses:  Altered mental status, unspecified altered mental status type  Dehydration    I started broad workup for AMS/weakness in elderly. EKG unchanged from prior. CXR unremarkable. CT head non-con showed no acute findings. CBC at pt's baseline. BUN 33 and urine protein 100 suggesting dehydration but UA shows no signs of infection. I got abdominal XR 1 view given pt's tenderness on exam but that was unremarkable as well. However, given acuity of pt's mental status change from baseline, age, and co-morbidities, I will consult hospitalists for possible admission.  Spoke to Dr. Wendee Beavers. Pt's workup in the ED does not indicate a specific diagnosis for workup/treatment. We can admit for AMS and dehydration. Hopefully pt mental status will improve with hydration. Will admit to hospital medicine.     Victoria Ng, PA-C 08/24/15 [place], MD 08/25/15  1239

## 2015-08-24 NOTE — ED Notes (Signed)
Per family members, patient has been declining in the past two days.  She has been losing her balance and failure to eat.  Family members states patient complains of headache

## 2015-08-25 DIAGNOSIS — G9341 Metabolic encephalopathy: Secondary | ICD-10-CM | POA: Diagnosis not present

## 2015-08-25 LAB — CBC
HCT: 26.7 % — ABNORMAL LOW (ref 36.0–46.0)
Hemoglobin: 8.1 g/dL — ABNORMAL LOW (ref 12.0–15.0)
MCH: 29.7 pg (ref 26.0–34.0)
MCHC: 30.3 g/dL (ref 30.0–36.0)
MCV: 97.8 fL (ref 78.0–100.0)
PLATELETS: 53 10*3/uL — AB (ref 150–400)
RBC: 2.73 MIL/uL — ABNORMAL LOW (ref 3.87–5.11)
RDW: 17.7 % — AB (ref 11.5–15.5)
WBC: 2.2 10*3/uL — ABNORMAL LOW (ref 4.0–10.5)

## 2015-08-25 LAB — BASIC METABOLIC PANEL
Anion gap: 6 (ref 5–15)
BUN: 27 mg/dL — AB (ref 6–20)
CALCIUM: 8.9 mg/dL (ref 8.9–10.3)
CO2: 26 mmol/L (ref 22–32)
CREATININE: 0.72 mg/dL (ref 0.44–1.00)
Chloride: 106 mmol/L (ref 101–111)
GFR calc Af Amer: >60 mL/min (ref >60)
Glucose, Bld: 115 mg/dL — ABNORMAL HIGH (ref 65–99)
Potassium: 4 mmol/L (ref 3.5–5.1)
SODIUM: 138 mmol/L (ref 135–145)

## 2015-08-25 LAB — HIV ANTIBODY (ROUTINE TESTING W REFLEX): HIV SCREEN 4TH GENERATION: NONREACTIVE

## 2015-08-25 LAB — RPR: RPR: NONREACTIVE

## 2015-08-25 NOTE — Progress Notes (Signed)
[facility] PROGRESS NOTE  Victoria [name] CHE:[id number] DOB: 12-Apr-1932 DOA: 08/24/2015 PCP: Alvy Bimler NI, MD  Assessment/Plan: Principal Problem:   Metabolic encephalopathy - unknown etiology, dehydration played a role and patient condition improved. - Pt does have a history of psychiatric cause for AMS. Will consult psychiatry as work up for me is negative today. Folate, ammonia, and Vit b12 wnl - continue to hold opiod  Active Problems:   Essential hypertension - stable off antihypertensives.    Dehydration - Will increase rate today.   Code Status: full Family Communication:  Discussed with son at bedside Disposition Plan: Pending further evaluation by Psychiatrist   Consultants:  psychiatry  Procedures:  none  Antibiotics:  None  HPI/Subjective: Pt has no new complaints. No acute issues overnight  Objective: Filed Vitals:   08/24/15 2246 08/25/15 0435  BP: 130/58 133/61  Pulse: 62 58  Temp: 98.4 F (36.9 C) 98 F (36.7 C)  Resp: 20 20    Intake/Output Summary (Last 24 hours) at 08/25/15 1150 Last data filed at 08/25/15 0900  Gross per 24 hour  Intake    965 ml  Output    350 ml  Net    615 ml   Filed Weights   08/24/15 1820 08/25/15 0435  Weight: 38.8 kg (85 lb 8.6 oz) 40.415 kg (89 lb 1.6 oz)    Exam: Patient did not allow for physical exam. I was told, "get out of here." below are visual findings  General:  Pt in nad, alert and awake  Cardiovascular: no cyanosis  Respiratory: equal chest rise, no wheezes  Abdomen: ND, no guarding  Musculoskeletal: no cyanosis on limited exam  Data Reviewed: Basic Metabolic Panel:  Recent Labs Lab 08/24/15 1408 08/25/15 0425  NA 139 138  K 3.9 4.0  CL 107 106  CO2 27 26  GLUCOSE 85 115*  BUN 33* 27*  CREATININE 0.76 0.72  CALCIUM 9.4 8.9  MG 2.0  --   PHOS 2.1*  --    Liver Function Tests:  Recent Labs Lab 08/24/15 1408  AST 44*  ALT 21  ALKPHOS 44  BILITOT 0.4  PROT  7.9  ALBUMIN 2.5*   No results for input(s): LIPASE, AMYLASE in the last 168 hours.  Recent Labs Lab 08/24/15 1945  AMMONIA 21   CBC:  Recent Labs Lab 08/24/15 1408 08/25/15 0425  WBC 2.0* 2.2*  HGB 8.6* 8.1*  HCT 27.4* 26.7*  MCV 97.2 97.8  PLT 70* 53*   Cardiac Enzymes: No results for input(s): CKTOTAL, CKMB, CKMBINDEX, TROPONINI in the last 168 hours. BNP (last 3 results) No results for input(s): BNP in the last 8760 hours.  ProBNP (last 3 results) No results for input(s): PROBNP in the last 8760 hours.  CBG: No results for input(s): GLUCAP in the last 168 hours.  No results found for this or any previous visit (from the past 240 hour(s)).   Studies: Ct Head Wo Contrast  08/24/2015  CLINICAL DATA:  Altered mental status.  Multiple myeloma EXAM: CT HEAD WITHOUT CONTRAST TECHNIQUE: Contiguous axial images were obtained from the base of the skull through the vertex without intravenous contrast. COMPARISON:  CT head 06/15/2015 FINDINGS: Generalized atrophy. Hypodensity in the cerebral white matter bilaterally unchanged compatible chronic microvascular ischemia. Negative for acute infarct.  Negative for hemorrhage or mass. Multiple low-density lesions throughout the calvarium consistent with multiple myeloma. These are unchanged. IMPRESSION: Atrophy and chronic microvascular ischemia.  No acute abnormality. Electronically Signed   By:  Franchot Gallo M.D.   On: 08/24/2015 15:32   Dg Chest Portable 1 View  08/24/2015  CLINICAL DATA:  Altered mental status today upon waking up. Loss of appetite for 2 days. Headache. EXAM: PORTABLE CHEST 1 VIEW COMPARISON:  06/14/2015 FINDINGS: Power injectable Port-A-Cath tip: 3 vertebral body levels below the carina, just into the right atrium. Tortuosity and atherosclerotic calcification of the aortic arch. Reverse lordotic projection with low lung volumes. Bibasilar subsegmental atelectasis or scarring. Stable mildly indistinct pulmonary  vasculature. Upper normal heart size. No overt edema. IMPRESSION: 1. Low lung volumes with mild bibasilar atelectasis or scarring. 2. Atherosclerotic aortic arch. 3. Right power injectable Port-A-Cath tip: Just into the right atrium, 3 vertebral levels below the carina. Electronically Signed   By: Van Clines M.D.   On: 08/24/2015 15:16   Dg Abd Portable 1v  08/24/2015  CLINICAL DATA:  81 year old female with multiple myeloma and altered mental status. EXAM: PORTABLE ABDOMEN - 1 VIEW COMPARISON:  None. FINDINGS: Nonobstructed bowel gas pattern. Colonic stool burden does not appear excessive. Multifocal lytic lesions throughout the skeletal structures the largest of which has largely replaced the right iliac wing. Findings are consistent with the clinical history of multiple myeloma. No large free air on this single supine radiograph. No abnormal calcifications. IMPRESSION: 1. Unremarkable bowel gas pattern. 2. Known multiple myeloma with large lytic lesion replacing the majority of the right iliac wing. Electronically Signed   By: Jacqulynn Cadet M.D.   On: 08/24/2015 16:44    Scheduled Meds: . acyclovir  400 mg Oral Daily  . antiseptic oral rinse  7 mL Mouth Rinse q12n4p  . chlorhexidine  15 mL Mouth Rinse BID  . dexamethasone  4 mg Oral Daily  . heparin  5,000 Units Subcutaneous 3 times per day  . sodium chloride  3 mL Intravenous Q12H   Continuous Infusions: . dextrose 5 % and 0.45 % NaCl with KCl 20 mEq/L 75 mL/hr at 08/25/15 0647    Time spent: > 35 minutes   [name]  [facility] Pager [phone]. If 7PM-7AM, please contact night-coverage at [email or web address], password [facility] 08/25/2015, 11:50 AM

## 2015-08-26 DIAGNOSIS — G9341 Metabolic encephalopathy: Secondary | ICD-10-CM | POA: Diagnosis not present

## 2015-08-26 MED ORDER — LORAZEPAM 0.5 MG PO TABS
0.5000 mg | ORAL_TABLET | Freq: Once | ORAL | Status: AC
  Administered 2015-08-26: 0.5 mg via ORAL
  Filled 2015-08-26: qty 1

## 2015-08-26 NOTE — Progress Notes (Signed)
[facility] PROGRESS NOTE  Victoria [name] [id number] DOB: 04/13/1932 DOA: 08/24/2015 PCP: Victoria Bimler NI, MD  Assessment/Plan: Principal Problem:   Metabolic encephalopathy - unknown etiology, dehydration played a role and patient condition improved. - Work up negative. Psychiatry consulted. Awaiting evaluation and recommendations - PT consulted - continue to hold opiod  Active Problems:   Essential hypertension - stable off antihypertensives.    Dehydration - Will increase rate today.   Code Status: full Family Communication:  Discussed with son at bedside Disposition Plan: Pending further evaluation by Psychiatrist and recommendations by PT   Consultants:  psychiatry  Procedures:  none  Antibiotics:  None  HPI/Subjective: Pt has no new complaints. No acute issues overnight  Objective: Filed Vitals:   08/25/15 2032 08/26/15 0618  BP: 118/63 138/61  Pulse: 63 60  Temp: 98.6 F (37 C) 98.3 F (36.8 C)  Resp: 18 18    Intake/Output Summary (Last 24 hours) at 08/26/15 1134 Last data filed at 08/26/15 0934  Gross per 24 hour  Intake 2464.17 ml  Output      0 ml  Net 2464.17 ml   Filed Weights   08/24/15 1820 08/25/15 0435 08/26/15 0500  Weight: 38.8 kg (85 lb 8.6 oz) 40.415 kg (89 lb 1.6 oz) 41.323 kg (91 lb 1.6 oz)    Exam:   General:  Pt in nad, alert and awake  Cardiovascular: rrr, no mrg  Respiratory: equal chest rise, no wheezes, clear to auscultation  Abdomen: ND, no guarding  Musculoskeletal: no cyanosis on limited exam  Data Reviewed: Basic Metabolic Panel:  Recent Labs Lab 08/24/15 1408 08/25/15 0425  NA 139 138  K 3.9 4.0  CL 107 106  CO2 27 26  GLUCOSE 85 115*  BUN 33* 27*  CREATININE 0.76 0.72  CALCIUM 9.4 8.9  MG 2.0  --   PHOS 2.1*  --    Liver Function Tests:  Recent Labs Lab 08/24/15 1408  AST 44*  ALT 21  ALKPHOS 44  BILITOT 0.4  PROT 7.9  ALBUMIN 2.5*   No results for input(s): LIPASE,  AMYLASE in the last 168 hours.  Recent Labs Lab 08/24/15 1945  AMMONIA 21   CBC:  Recent Labs Lab 08/24/15 1408 08/25/15 0425  WBC 2.0* 2.2*  HGB 8.6* 8.1*  HCT 27.4* 26.7*  MCV 97.2 97.8  PLT 70* 53*   Cardiac Enzymes: No results for input(s): CKTOTAL, CKMB, CKMBINDEX, TROPONINI in the last 168 hours. BNP (last 3 results) No results for input(s): BNP in the last 8760 hours.  ProBNP (last 3 results) No results for input(s): PROBNP in the last 8760 hours.  CBG: No results for input(s): GLUCAP in the last 168 hours.  No results found for this or any previous visit (from the past 240 hour(s)).   Studies: Ct Head Wo Contrast  08/24/2015  CLINICAL DATA:  Altered mental status.  Multiple myeloma EXAM: CT HEAD WITHOUT CONTRAST TECHNIQUE: Contiguous axial images were obtained from the base of the skull through the vertex without intravenous contrast. COMPARISON:  CT head 06/15/2015 FINDINGS: Generalized atrophy. Hypodensity in the cerebral white matter bilaterally unchanged compatible chronic microvascular ischemia. Negative for acute infarct.  Negative for hemorrhage or mass. Multiple low-density lesions throughout the calvarium consistent with multiple myeloma. These are unchanged. IMPRESSION: Atrophy and chronic microvascular ischemia.  No acute abnormality. Electronically Signed   By: Franchot Gallo M.D.   On: 08/24/2015 15:32   Dg Chest Portable 1 View  08/24/2015  CLINICAL  DATA:  Altered mental status today upon waking up. Loss of appetite for 2 days. Headache. EXAM: PORTABLE CHEST 1 VIEW COMPARISON:  06/14/2015 FINDINGS: Power injectable Port-A-Cath tip: 3 vertebral body levels below the carina, just into the right atrium. Tortuosity and atherosclerotic calcification of the aortic arch. Reverse lordotic projection with low lung volumes. Bibasilar subsegmental atelectasis or scarring. Stable mildly indistinct pulmonary vasculature. Upper normal heart size. No overt edema.  IMPRESSION: 1. Low lung volumes with mild bibasilar atelectasis or scarring. 2. Atherosclerotic aortic arch. 3. Right power injectable Port-A-Cath tip: Just into the right atrium, 3 vertebral levels below the carina. Electronically Signed   By: Van Clines M.D.   On: 08/24/2015 15:16   Dg Abd Portable 1v  08/24/2015  CLINICAL DATA:  81 year old female with multiple myeloma and altered mental status. EXAM: PORTABLE ABDOMEN - 1 VIEW COMPARISON:  None. FINDINGS: Nonobstructed bowel gas pattern. Colonic stool burden does not appear excessive. Multifocal lytic lesions throughout the skeletal structures the largest of which has largely replaced the right iliac wing. Findings are consistent with the clinical history of multiple myeloma. No large free air on this single supine radiograph. No abnormal calcifications. IMPRESSION: 1. Unremarkable bowel gas pattern. 2. Known multiple myeloma with large lytic lesion replacing the majority of the right iliac wing. Electronically Signed   By: Jacqulynn Cadet M.D.   On: 08/24/2015 16:44    Scheduled Meds: . acyclovir  400 mg Oral Daily  . antiseptic oral rinse  7 mL Mouth Rinse q12n4p  . chlorhexidine  15 mL Mouth Rinse BID  . dexamethasone  4 mg Oral Daily  . heparin  5,000 Units Subcutaneous 3 times per day  . sodium chloride  3 mL Intravenous Q12H   Continuous Infusions: . dextrose 5 % and 0.45 % NaCl with KCl 20 mEq/L 100 mL/hr at 08/26/15 0536    Time spent: > 35 minutes   [name]  [facility] Pager [phone]. If 7PM-7AM, please contact night-coverage at [email or web address], password [facility] 08/26/2015, 11:34 AM

## 2015-08-27 DIAGNOSIS — G9341 Metabolic encephalopathy: Secondary | ICD-10-CM | POA: Diagnosis not present

## 2015-08-27 MED ORDER — LORAZEPAM 2 MG/ML IJ SOLN
0.5000 mg | Freq: Once | INTRAMUSCULAR | Status: AC | PRN
Start: 2015-08-27 — End: 2015-08-27
  Administered 2015-08-27: 0.5 mg via INTRAVENOUS
  Filled 2015-08-27 (×2): qty 1

## 2015-08-27 NOTE — NC FL2 (Signed)
[place] LEVEL OF CARE SCREENING TOOL     IDENTIFICATION  Patient Name: Victoria [name] Birthdate: 01/07/1932 Sex: female Admission Date (Current Location): 08/24/2015  [place] and [place] Number: [facility] and Address:  [facility],  [place] [address], [place]      Provider Number: [id number]  Attending Physician Name and Address:  [name], MD  Relative Name and Phone Number:       Current Level of Care: Hospital Recommended Level of Care: [place] Prior Approval Number:    Date Approved/Denied:   PASRR Number: [id number]  Discharge Plan: SNF    Current Diagnoses: Patient Active Problem List   Diagnosis Date Noted  . Metabolic encephalopathy 46/50/3546  . Dysuria 08/08/2015  . Full code status 07/02/2015  . Poor venous access 07/02/2015  . Palliative care encounter 06/07/2015  . Weakness generalized 06/07/2015  . DNR (do not resuscitate) discussion 06/07/2015  . Protein-calorie malnutrition, severe ([place]) 05/18/2015  . Hypoglycemia 05/16/2015  . Acute confusional state 05/14/2015  . Bilateral lower extremity edema 04/27/2015  . Conjunctivitis of left eye 03/30/2015  . Coronary artery calcification 03/28/2015  . PVC's (premature ventricular contractions) 03/28/2015  . Weakness 03/28/2015  . Mild dementia 03/07/2015  . Protein calorie malnutrition ([place]) 03/02/2015  . Dehydration 02/16/2015  . Diarrhea due to drug 02/16/2015  . Leukopenia due to antineoplastic chemotherapy 02/01/2015  . Pancytopenia ([place]) 01/24/2015  . Hypokalemia 12/30/2014  . Multiple myeloma in relapse ([place]) 12/30/2014  . Dementia with behavioral disturbance 10/02/2014  . Hypercalcemia 07/11/2014  . ATAXIA 12/14/2009  . HEARING DEFICIT 05/31/2009  . Anxiety state 03/21/2008  . Anemia 03/08/2008  . INSOMNIA, PERSISTENT 03/08/2008  . 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 Essential  hypertension 11/25/2007    Orientation ACTIVITIES/SOCIAL BLADDER RESPIRATION    Self  Passive Incontinent Normal  BEHAVIORAL SYMPTOMS/MOOD NEUROLOGICAL BOWEL NUTRITION STATUS     (NONE) Continent Diet (Regular Diet)  PHYSICIAN VISITS COMMUNICATION OF NEEDS Height & Weight Skin    Verbally $RemoveB'5\' 1"'vtWbrVUP$  (154.9 cm) 91 lbs. Normal          AMBULATORY STATUS RESPIRATION    Supervision limited Normal      Personal Care Assistance Level of Assistance  Bathing, Feeding, Dressing Bathing Assistance: Limited assistance Feeding assistance: Limited assistance Dressing Assistance: Limited assistance      Functional Limitations Info  Sight, Hearing, Speech Sight Info: Adequate Hearing Info: Adequate Speech Info: Adequate       SPECIAL CARE FACTORS FREQUENCY  PT (By licensed PT), OT (By licensed OT)     PT Frequency: 5 x a week OT Frequency: 5 x a week           Additional Factors Info  Code Status, Allergies Code Status Info: FULL code status Allergies Info: Amoxicillin, Aspirin, Atenolol, Donepezil, Doxycycline, Nortriptyline, Amiodarone, Latex, Zoloft           Current Medications (08/27/2015):  This is the current hospital active medication list Current Facility-Administered Medications  Medication Dose Route Frequency Provider Last Rate Last Dose  . acetaminophen (TYLENOL) tablet 650 mg  650 mg Oral Q6H PRN [name], MD   650 mg at 08/25/15 2039   Or  . acetaminophen (TYLENOL) suppository 650 mg  650 mg Rectal Q6H PRN [name], MD      . acyclovir (ZOVIRAX) tablet 400 mg  400 mg Oral Daily [name], MD   400 mg at  08/27/15 0924  . antiseptic oral rinse (CPC / CETYLPYRIDINIUM CHLORIDE 0.05%) solution 7 mL  7 mL Mouth Rinse q12n4p [name], MD   7 mL at 08/27/15 1200  . chlorhexidine (PERIDEX) 0.12 % solution 15 mL  15 mL Mouth Rinse BID [name], MD   15 mL at 08/27/15 0924  . dexamethasone (DECADRON) tablet 4 mg  4 mg Oral Daily [name], MD   4 mg  at 08/27/15 0924  . dextrose 5 % and 0.45 % NaCl with KCl 20 mEq/L infusion   Intravenous Continuous [name], MD 100 mL/hr at 08/27/15 1109    . heparin injection 5,000 Units  5,000 Units Subcutaneous 3 times per day [name], MD   5,000 Units at 08/27/15 1419  . promethazine (PHENERGAN) tablet 12.5 mg  12.5 mg Oral Q6H PRN [name], MD      . sodium chloride 0.9 % injection 10-40 mL  10-40 mL Intracatheter PRN [name], MD      . sodium chloride 0.9 % injection 3 mL  3 mL Intravenous Q12H [name], MD   3 mL at 08/24/15 2200   Facility-Administered Medications Ordered in Other Encounters  Medication Dose Route Frequency Provider Last Rate Last Dose  . 0.9 %  sodium chloride infusion   Intravenous Once Heath Lark, MD      . sodium chloride 0.9 % injection 10 mL  10 mL Intravenous PRN Heath Lark, MD   10 mL at 08/01/15 1405     Discharge Medications: Please see discharge summary for a list of discharge medications.  Relevant Imaging Results:  Relevant Lab Results:  Recent Labs    Additional Information SSN: [id number]  KIDD, SUZANNA A, LCSW

## 2015-08-27 NOTE — Evaluation (Signed)
Physical Therapy Evaluation Patient Details Name: Victoria [name] MRN: [id number] DOB: September 24, 1932 Today's Date: 08/27/2015   History of Present Illness  81 yo female admitted with metabolic encephalopathy. Hx of myltiple myeloma, HTN, chronic anemia, dementia.   Clinical Impression  On eval, pt required Min-Mod assist for mobility-performed standpivot from bed to bsc, bsc to bed. Very unsteady and at risk for falls. Pt remains confused however she has a history of dementia. At this time, recommend ST rehab at [facility].     Follow Up Recommendations SNF;Supervision/Assistance - 24 hour    Equipment Recommendations  None recommended by PT    Recommendations for Other Services OT consult     Precautions / Restrictions Precautions Precautions: Fall Restrictions Weight Bearing Restrictions: No      Mobility  Bed Mobility Overal bed mobility: Needs Assistance Bed Mobility: Supine to Sit;Sit to Supine     Supine to sit: Min assist;HOB elevated Sit to supine: Min assist;HOB elevated   General bed mobility comments: Multimodal cueing for safety, technique. Increased time.  Transfers Overall transfer level: Needs assistance   Transfers: Sit to/from Stand;Stand Pivot Transfers Sit to Stand: Mod assist;From elevated surface Stand pivot transfers: Min assist       General transfer comment: Assist to rise, stabilize, weight shift. Stand pivot from bed to bsc with 1 HHA. Weight shifted posteriorly with initial standing.   Ambulation/Gait             General Gait Details: NT-pt requested to return to bed  Stairs            Wheelchair Mobility    Modified Rankin (Stroke Patients Only)       Balance Overall balance assessment: Needs assistance       Postural control: Posterior lean Standing balance support: Bilateral upper extremity supported;During functional activity Standing balance-Leahy Scale: Poor                               Pertinent  Vitals/Pain Pain Assessment: No/denies pain    Home Living Family/patient expects to be discharged to:: Private residence Living Arrangements: Children Available Help at Discharge: Family Type of Home: House Home Access: Stairs to enter Entrance Stairs-Rails: Can reach both Entrance Stairs-Number of Steps: 4 Home Layout: One level Home Equipment: Environmental consultant - 2 wheels;Cane - single point;Bedside commode      Prior Function Level of Independence: Needs assistance   Gait / Transfers Assistance Needed: uses walker           Hand Dominance        Extremity/Trunk Assessment   Upper Extremity Assessment: Generalized weakness           Lower Extremity Assessment: Generalized weakness      Cervical / Trunk Assessment: Kyphotic  Communication   Communication: No difficulties  Cognition Arousal/Alertness: Awake/alert Behavior During Therapy: WFL for tasks assessed/performed Overall Cognitive Status: History of cognitive impairments - at baseline                      General Comments      Exercises        Assessment/Plan    PT Assessment Patient needs continued PT services  PT Diagnosis Difficulty walking;Generalized weakness;Altered mental status   PT Problem List Decreased strength;Decreased activity tolerance;Decreased balance;Decreased mobility;Decreased knowledge of use of DME;Decreased cognition;Decreased safety awareness  PT Treatment Interventions DME instruction;Gait training;Functional mobility training;Therapeutic activities;Patient/family education;Balance training;Therapeutic exercise  PT Goals (Current goals can be found in the Care Plan section) Acute Rehab PT Goals Patient Stated Goal: none stated. Spoke with daughter after session-unsure of d/c plan PT Goal Formulation: With family Time For Goal Achievement: 09/10/15 Potential to Achieve Goals: Fair    Frequency Min 3X/week   Barriers to discharge        Co-evaluation                End of Session   Activity Tolerance: Patient tolerated treatment well Patient left: in bed;with call bell/phone within reach;with bed alarm set           Time: [id number] PT Time Calculation (min) (ACUTE ONLY): 14 min   Charges:   PT Evaluation $Initial PT Evaluation Tier I: 1 Procedure     PT G Codes:        Weston Anna, MPT Pager: [phone]

## 2015-08-27 NOTE — Progress Notes (Signed)
Initial Nutrition Assessment  DOCUMENTATION CODES:   Severe malnutrition in context of chronic illness  INTERVENTION:   Ensure Enlive po BID, each supplement provides 350 kcal and 20 grams of protein  Pt's daughter requested peanut butter, said pt will sometimes just eat peanut butter.    NUTRITION DIAGNOSIS:   Malnutrition related to poor appetite, other (see comment) (altered taste.) as evidenced by moderate depletion of body fat, severe depletion of muscle mass, percent weight loss.  GOAL:   Patient will meet greater than or equal to 90% of their needs  MONITOR:   PO intake, Supplement acceptance, Labs, I & O's, Skin  REASON FOR ASSESSMENT:   Malnutrition Screening Tool    ASSESSMENT:   Victoria [name] is an 81 yo female With history of multiple myeloma being followed by Dr.Gorsuch, presents to the hospital after some reports patient was having increasing confusion for the last 3 days which has been progressively getting worse. The problem has been persistent since onset. The patient has been eating less and less reportedly and today has not eating all that well. She also had difficulty working with physical therapy and reportedly was letting herself fall backwards  Spoke with family at bedside. Pt is severely confused, information obtained from children. Reported pt has been experiencing taste changes/food not tasting the same for a while. This has severely decreased her PO intake, but she will still eat some. As of recent they report pt spitting out food. No N/V or chewing/swallowing problems.  She has experienced a 14#/13%  Severe wt loss in 2 months. Pt also appears severely malnourished with severe muscle depletion and moderate-severe fat depletion.  RD will order peanut butter based snacks, and provide ensure during stay.  Monitor for acceptance and PO intake.  Diet Order:  Diet regular Room service appropriate?: Yes; Fluid consistency:: Thin  Skin:  Reviewed, no  issues  Last BM:  08/26/2015  Height:   Ht Readings from Last 1 Encounters:  08/24/15 _0  (1.549 m)    Weight:   Wt Readings from Last 1 Encounters:  08/27/15 91 lb 5 oz (41.42 kg)    Ideal Body Weight:  47.72 kg  BMI:  Body mass index is 17.26 kg/(m^2).  Estimated Nutritional Needs:   Kcal:  1400-1600 calories  Protein:  45-55 grams  Fluid:  >/= 1.4L  EDUCATION NEEDS:   Education needs addressed  Victoria Anis. Henrietta Cieslewicz, MS, RD LDN After Hours/Weekend Pager [phone]

## 2015-08-27 NOTE — Progress Notes (Signed)
[facility] PROGRESS NOTE  KELSEYANN TOOT [id number] DOB: 12-13-1931 DOA: 08/24/2015 PCP: Heath Lark, MD  Assessment/Plan: Principal Problem:   Metabolic encephalopathy - Mentation has improved with IVF rehydration. - Work up negative. Psychiatry consulted. Awaiting evaluation and recommendations - PT consulted - continue to hold opiod  Active Problems:   Essential hypertension - stable off antihypertensives.    Dehydration - Will increase rate today.   Code Status: full Family Communication:  Discussed with son at bedside Disposition Plan: Pending further evaluation by Psychiatrist and recommendations by PT   Consultants:  psychiatry  Procedures:  none  Antibiotics:  None  HPI/Subjective: Pt has no new complaints. No acute issues overnight  Objective: Filed Vitals:   08/27/15 0430 08/27/15 1500  BP: 138/68 146/70  Pulse: 72 75  Temp: 98 F (36.7 C) 98.1 F (36.7 C)  Resp: 18 20    Intake/Output Summary (Last 24 hours) at 08/27/15 1744 Last data filed at 08/27/15 0845  Gross per 24 hour  Intake   1960 ml  Output      0 ml  Net   1960 ml   Filed Weights   08/25/15 0435 08/26/15 0500 08/27/15 0430  Weight: 40.415 kg (89 lb 1.6 oz) 41.323 kg (91 lb 1.6 oz) 41.42 kg (91 lb 5 oz)    Exam:   General:  Pt in nad, alert and awake  Cardiovascular: rrr, no mrg  Respiratory: equal chest rise, no wheezes, clear to auscultation  Abdomen: ND, no guarding  Musculoskeletal: no cyanosis on limited exam  Data Reviewed: Basic Metabolic Panel:  Recent Labs Lab 08/24/15 1408 08/25/15 0425  NA 139 138  K 3.9 4.0  CL 107 106  CO2 27 26  GLUCOSE 85 115*  BUN 33* 27*  CREATININE 0.76 0.72  CALCIUM 9.4 8.9  MG 2.0  --   PHOS 2.1*  --    Liver Function Tests:  Recent Labs Lab 08/24/15 1408  AST 44*  ALT 21  ALKPHOS 44  BILITOT 0.4  PROT 7.9  ALBUMIN 2.5*   No results for input(s): LIPASE, AMYLASE in the last 168  hours.  Recent Labs Lab 08/24/15 1945  AMMONIA 21   CBC:  Recent Labs Lab 08/24/15 1408 08/25/15 0425  WBC 2.0* 2.2*  HGB 8.6* 8.1*  HCT 27.4* 26.7*  MCV 97.2 97.8  PLT 70* 53*   Cardiac Enzymes: No results for input(s): CKTOTAL, CKMB, CKMBINDEX, TROPONINI in the last 168 hours. BNP (last 3 results) No results for input(s): BNP in the last 8760 hours.  ProBNP (last 3 results) No results for input(s): PROBNP in the last 8760 hours.  CBG: No results for input(s): GLUCAP in the last 168 hours.  No results found for this or any previous visit (from the past 240 hour(s)).   Studies: No results found.  Scheduled Meds: . acyclovir  400 mg Oral Daily  . antiseptic oral rinse  7 mL Mouth Rinse q12n4p  . chlorhexidine  15 mL Mouth Rinse BID  . dexamethasone  4 mg Oral Daily  . heparin  5,000 Units Subcutaneous 3 times per day  . sodium chloride  3 mL Intravenous Q12H   Continuous Infusions: . dextrose 5 % and 0.45 % NaCl with KCl 20 mEq/L 100 mL/hr at 08/27/15 1109    Time spent: > 35 minutes   [name]  [facility] Pager [phone]. If 7PM-7AM, please contact night-coverage at [email or web address], password [email or web address] 08/27/2015, 5:44 PM

## 2015-08-27 NOTE — Progress Notes (Signed)
Spoke with family at bedside concerning disposition. Plans are to dc to SNF. CSW following for DC.

## 2015-08-27 NOTE — Clinical Social Work Placement (Signed)
   CLINICAL SOCIAL WORK PLACEMENT  NOTE  Date:  08/27/2015  Patient Details  Name: Victoria [name] MRN: [id number] Date of Birth: 01-17-1932  Clinical Social Work is seeking post-discharge placement for this patient at the [place] level of care (*CSW will initial, date and re-position this form in  chart as items are completed):  Yes   Patient/family provided with [place] Work Department's list of facilities offering this level of care within the geographic area requested by the patient (or if unable, by the patient's family).  Yes   Patient/family informed of their freedom to choose among providers that offer the needed level of care, that participate in Medicare, Medicaid or managed care program needed by the patient, have an available bed and are willing to accept the patient.  Yes   Patient/family informed of [facility]'s ownership interest in [facility] and [facility], as well as of the fact that they are under no obligation to receive care at these facilities.  PASRR submitted to EDS on       PASRR number received on       Existing PASRR number confirmed on 08/27/15     FL2 transmitted to all facilities in geographic area requested by pt/family on 08/27/15     FL2 transmitted to all facilities within larger geographic area on       Patient informed that his/her managed care company has contracts with or will negotiate with certain facilities, including the following:            Patient/family informed of bed offers received.  Patient chooses bed at       Physician recommends and patient chooses bed at      Patient to be transferred to   on  .  Patient to be transferred to facility by       Patient family notified on   of transfer.  Name of family member notified:        PHYSICIAN Please sign FL2     Additional Comment:    _______________________________________________ Ladell Pier, LCSW 08/27/2015, 5:21  PM

## 2015-08-27 NOTE — Progress Notes (Signed)
   08/27/15 1054  PT Time Calculation  PT Start Time (ACUTE ONLY) 1021  PT Stop Time (ACUTE ONLY) 1035  PT Time Calculation (min) (ACUTE ONLY) 14 min  PT G-Codes **NOT FOR INPATIENT CLASS**  Functional Assessment Tool Used (clinical judgement)  Functional Limitation Mobility: Walking and moving around  Mobility: Walking and Moving Around Current Status [id number]) CJ  Mobility: Walking and Moving Around Goal Status [id number]) CI  PT General Charges  $$ ACUTE PT VISIT 1 Procedure  PT Evaluation  $Initial PT Evaluation Tier I 1 Procedure   Weston Anna, MPT [phone]

## 2015-08-28 ENCOUNTER — Telehealth: Payer: Self-pay | Admitting: *Deleted

## 2015-08-28 DIAGNOSIS — F062 Psychotic disorder with delusions due to known physiological condition: Secondary | ICD-10-CM

## 2015-08-28 LAB — T4, FREE: FREE T4: 1.06 ng/dL (ref 0.61–1.12)

## 2015-08-28 LAB — TSH: TSH: 1.64 u[IU]/mL (ref 0.350–4.500)

## 2015-08-28 MED ORDER — HEPARIN SOD (PORK) LOCK FLUSH 100 UNIT/ML IV SOLN
500.0000 [IU] | INTRAVENOUS | Status: DC
  Filled 2015-08-28: qty 5

## 2015-08-28 MED ORDER — HEPARIN SOD (PORK) LOCK FLUSH 100 UNIT/ML IV SOLN
500.0000 [IU] | INTRAVENOUS | Status: DC | PRN
  Administered 2015-08-28: 500 [IU]
  Filled 2015-08-28 (×2): qty 5

## 2015-08-28 MED ORDER — ACETAMINOPHEN 325 MG PO TABS: 650.0000 mg | ORAL_TABLET | Freq: Four times a day (QID) | ORAL | Status: DC | PRN

## 2015-08-28 MED ORDER — QUETIAPINE FUMARATE 25 MG PO TABS
25.0000 mg | ORAL_TABLET | Freq: Two times a day (BID) | ORAL | Status: DC
  Administered 2015-08-28: 25 mg via ORAL
  Filled 2015-08-28 (×3): qty 1

## 2015-08-28 MED ORDER — QUETIAPINE FUMARATE 25 MG PO TABS: 25.0000 mg | ORAL_TABLET | Freq: Two times a day (BID) | ORAL | Status: DC

## 2015-08-28 NOTE — Progress Notes (Signed)
CSW met with pt / son at bedside to assist with d/c planning. SNF bed offers provided. CSW will assist with d/c to SNF when pt is medically ready for d/c.  Werner Lean LCSW [phone]

## 2015-08-28 NOTE — Discharge Summary (Signed)
Physician Discharge Summary  Victoria [name] [id number] DOB: September 19, 1932 DOA: 08/24/2015  PCP: Heath Lark, MD  Admit date: 08/24/2015 Discharge date: 08/28/2015  Time spent: > 35 minutes  Recommendations for Outpatient Follow-up:  1. Assess TSH levels 2. Patient to continue Seroquel as per psychiatric recommendations 3. 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 for such who presented to the hospital after developing increasing confusion for the last 3 days prior to admission. Reportedly patient was not eating or drinking well and presented with dehydration  Hospital Course:  Altered mental status - Workup negative improved with IV fluid rehydration as such most likely secondary to dehydration and poor oral intake. - Concern was whether or not this was secondary to uncontrolled psychiatric condition. As such psychiatrist consulted and place patient on seroquel  Dehydration - Resolved after IV fluid rehydration and improved oral intake  Multiple myeloma - Patient to follow-up with Dr. courses for continued evaluation recommendations from an oncology perspective.  Procedures:  None  Consultations:  Psychiatry  Discharge Exam: Filed Vitals:   08/28/15 0300 08/28/15 1400  BP: 138/57 138/64  Pulse: 71 75  Temp: 98.8 F (37.1 C) 98.9 F (37.2 C)  Resp: 20 18    General: Pt in nad, alert and awake Cardiovascular: rrr, no mrg Respiratory: cta bl, no  wheezes  Discharge Instructions   Discharge Instructions    Call MD for:  redness, tenderness, or signs of infection (pain, swelling, redness, odor or green/yellow discharge around incision site)    Complete by:  As directed      Call MD for:  temperature >100.4    Complete by:  As directed      Diet - low sodium heart healthy    Complete by:  As directed      Discharge instructions    Complete by:  As directed   Monitor patient's intake and output. Have pcp monitor patient at SNF and patient to continue PT while at facility     Increase activity slowly    Complete by:  As directed           Current Discharge Medication List    START taking these medications   Details  acetaminophen (TYLENOL) 325 MG tablet Take 2 tablets (650 mg total) by mouth every 6 (six) hours as needed for mild pain (or Fever >/= 101).    QUEtiapine (SEROQUEL) 25 MG tablet Take 1 tablet (25 mg total) by mouth 2 (two) times daily. Qty: 60 tablet, Refills: 0      CONTINUE these medications which have NOT CHANGED   Details  acyclovir (ZOVIRAX) 400 MG tablet Take 1 tablet (400 mg total) by mouth daily. Qty: 60 tablet, Refills: 11   Associated Diagnoses: Multiple myeloma in relapse (HCC)    cholecalciferol (VITAMIN D) 1000 UNITS tablet Take 2,000 Units by mouth daily.    dexamethasone (DECADRON) 4 MG tablet Take 1 tablet (4 mg total) by mouth daily. Qty: 30  tablet, Refills: 0    lidocaine-prilocaine (EMLA) cream Apply to affected area once Qty: 30 g, Refills: 3   Associated Diagnoses: Multiple myeloma in relapse (HCC)    Multiple Vitamins-Minerals (MULTIVITAMIN & MINERAL PO) Take 1 tablet by mouth daily.    tobramycin (TOBREX) 0.3 % ophthalmic solution Place 1 drop into the left eye every 4 (four) hours. Qty: 5 mL, Refills: 0      STOP taking these medications     promethazine (PHENERGAN) 25 MG tablet      traMADol (ULTRAM) 50 MG tablet      LORazepam (ATIVAN) 1 MG tablet        Allergies   Allergen Reactions  . Amoxicillin Hives and Itching    Has patient had a PCN reaction causing immediate rash, facial/tongue/throat swelling, SOB or lightheadedness with hypotension: No Has patient had a PCN reaction causing severe rash involving mucus membranes or skin necrosis: No Has patient had a PCN reaction that required hospitalization No Has patient had a PCN reaction occurring within the last 10 years: No If all of the above answers are "NO", then may proceed with Cephalosporin use.  . Aspirin Other (See Comments)    Child hood allergy   . Atenolol     Hair loss  . Donepezil Other (See Comments)    agitation   . Doxycycline     Altered mental status   . Nortriptyline     agitation  . Amiodarone Rash  . Latex Dermatitis  . Zoloft [Sertraline Hcl] Anxiety    Increased agitation      The results of significant diagnostics from this hospitalization (including imaging, microbiology, ancillary and laboratory) are listed below for reference.    Significant Diagnostic Studies: Ct Head Wo Contrast  08/24/2015  CLINICAL DATA:  Altered mental status.  Multiple myeloma EXAM: CT HEAD WITHOUT CONTRAST TECHNIQUE: Contiguous axial images were obtained from the base of the skull through the vertex without intravenous contrast. COMPARISON:  CT head 06/15/2015 FINDINGS: Generalized atrophy. Hypodensity in the cerebral white matter bilaterally unchanged compatible chronic microvascular ischemia. Negative for acute infarct.  Negative for hemorrhage or mass. Multiple low-density lesions throughout the calvarium consistent with multiple myeloma. These are unchanged. IMPRESSION: Atrophy and chronic microvascular ischemia.  No acute abnormality. Electronically Signed   By: Franchot Gallo M.D.   On: 08/24/2015 15:32   Dg Chest Portable 1 View  08/24/2015  CLINICAL DATA:  Altered mental status today upon waking up. Loss of appetite for 2 days. Headache. EXAM: PORTABLE CHEST 1 VIEW COMPARISON:   06/14/2015 FINDINGS: Power injectable Port-A-Cath tip: 3 vertebral body levels below the carina, just into the right atrium. Tortuosity and atherosclerotic calcification of the aortic arch. Reverse lordotic projection with low lung volumes. Bibasilar subsegmental atelectasis or scarring. Stable mildly indistinct pulmonary vasculature. Upper normal heart size. No overt edema. IMPRESSION: 1. Low lung volumes with mild bibasilar atelectasis or scarring. 2. Atherosclerotic aortic arch. 3. Right power injectable Port-A-Cath tip: Just into the right atrium, 3 vertebral levels below the carina. Electronically Signed   By: Van Clines M.D.   On: 08/24/2015 15:16   Dg Abd Portable 1v  08/24/2015  CLINICAL DATA:  81 year old female with multiple myeloma and altered mental status. EXAM: PORTABLE ABDOMEN - 1 VIEW COMPARISON:  None. FINDINGS: Nonobstructed bowel gas pattern. Colonic stool burden does not appear excessive. Multifocal lytic lesions throughout the skeletal structures the largest of which has largely replaced the right iliac wing. Findings are consistent with the  clinical history of multiple myeloma. No large free air on this single supine radiograph. No abnormal calcifications. IMPRESSION: 1. Unremarkable bowel gas pattern. 2. Known multiple myeloma with large lytic lesion replacing the majority of the right iliac wing. Electronically Signed   By: Jacqulynn Cadet M.D.   On: 08/24/2015 16:44    Microbiology: No results found for this or any previous visit (from the past 240 hour(s)).   Labs: Basic Metabolic Panel:  Recent Labs Lab 08/24/15 1408 08/25/15 0425  NA 139 138  K 3.9 4.0  CL 107 106  CO2 27 26  GLUCOSE 85 115*  BUN 33* 27*  CREATININE 0.76 0.72  CALCIUM 9.4 8.9  MG 2.0  --   PHOS 2.1*  --    Liver Function Tests:  Recent Labs Lab 08/24/15 1408  AST 44*  ALT 21  ALKPHOS 44  BILITOT 0.4  PROT 7.9  ALBUMIN 2.5*   No results for input(s): LIPASE, AMYLASE in the  last 168 hours.  Recent Labs Lab 08/24/15 1945  AMMONIA 21   CBC:  Recent Labs Lab 08/24/15 1408 08/25/15 0425  WBC 2.0* 2.2*  HGB 8.6* 8.1*  HCT 27.4* 26.7*  MCV 97.2 97.8  PLT 70* 53*   Cardiac Enzymes: No results for input(s): CKTOTAL, CKMB, CKMBINDEX, TROPONINI in the last 168 hours. BNP: BNP (last 3 results) No results for input(s): BNP in the last 8760 hours.  ProBNP (last 3 results) No results for input(s): PROBNP in the last 8760 hours.  CBG: No results for input(s): GLUCAP in the last 168 hours.   Signed:  [name]  [facility] 08/28/2015, 3:07 PM

## 2015-08-28 NOTE — Clinical Social Work Note (Signed)
Clinical Social Work Assessment-Late Entry  Patient Details  Name: Victoria [name] MRN: [id number] Date of Birth: [birth year]  Date of referral:  08/27/15               Reason for consult:  Discharge Planning                Permission sought to share information with:  Family Supports Permission granted to share information::     Name::     Ron (son) Ebony Hail (daughter)  Agency::     Relationship::  son and daughter  Contact Information:     Housing/Transportation Living arrangements for the past 2 months:  Single Family Home Source of Information:  Adult Children Patient Interpreter Needed:  None Criminal Activity/Legal Involvement Pertinent to Current Situation/Hospitalization:  No - Comment as needed Significant Relationships:  Adult Children Lives with:  Adult Children Do you feel safe going back to the place where you live?  No Need for family participation in patient care:  Yes (Comment)  Care giving concerns:  Pt admitted from home with pt daughter. PT evaluated pt today and recommended SNF.    Social Worker assessment / plan:  CSW received referral for New SNF.   CSW met with pt daughter and pt son at bedside. Pt confused and unable to participate in assessment. CSW provided support as pt daughter and pt son expressed concern surrounding pt worsening confusion. Pt son stated that MD notified pt son since admission that psych was going to be consulted, but psych MD had not yet seen pt and pt son was frustrated that no one could give explanation to why. CSW discussed that CSW could page attending MD in order for attending MD to address this concern. Pt son appreciative. CSW discussed with pt son and pt daughter about disposition plan for SNF. Pt children are agreeable to [facility] search.   CSW completed FL2 and initiated SNF search to [facility].   CSW paged attending MD to notify of pt son wishes to get clarification about psych consult as psych has not yet seen  pt. Attending MD states that he will discuss with pt family.   CSW to follow up with pt children re: SNF bed offers.   CSW to continue to follow to provide support and assist with pt disposition needs.   Employment status:  Retired Nurse, adult PT Recommendations:  [place] / Referral to community resources:  [place]  Patient/Family's Response to care:  Pt confused and unable to participate in assessment. Pt children are actively involved and concerned about pt worsening confusion. Pt children agreeable to plan for SNF upon discharge.  Patient/Family's Understanding of and Emotional Response to Diagnosis, Current Treatment, and Prognosis:  Pt children displayed knowledge surrounding pt diagnosis and wanting clarification about psych consulting pt as pt family is concerned about pt worsening confusion.   Emotional Assessment Appearance:  Appears stated age Attitude/Demeanor/Rapport:  Unable to Assess Affect (typically observed):  Unable to Assess Orientation:  Oriented to Self Alcohol / Substance use:  Not Applicable Psych involvement (Current and /or in the community):  No (Comment)  Discharge Needs  Concerns to be addressed:  Discharge Planning Concerns Readmission within the last 30 days:  No Current discharge risk:  Physical Impairment Barriers to Discharge:  Continued Medical Work up   Ladell Pier, LCSW 08/28/2015, 6:40 PM  [phone]

## 2015-08-28 NOTE — Progress Notes (Signed)
Patient is being discharged to SNF, [facility] Report has been called to [facility] at facility. Non-emergency transport has been requested for transportation. Port-a-cath deaccessed by IV team and belongings collected by family and nurse tech. No s/s of acute distress.

## 2015-08-28 NOTE — Clinical Social Work Placement (Signed)
   CLINICAL SOCIAL WORK PLACEMENT  NOTE  Date:  08/28/2015  Patient Details  Name: Victoria [name] MRN: [id number] Date of Birth: 11/02/1931  Clinical Social Work is seeking post-discharge placement for this patient at the [place] level of care (*CSW will initial, date and re-position this form in  chart as items are completed):  Yes   Patient/family provided with [place] Work Department's list of facilities offering this level of care within the geographic area requested by the patient (or if unable, by the patient's family).  Yes   Patient/family informed of their freedom to choose among providers that offer the needed level of care, that participate in Medicare, Medicaid or managed care program needed by the patient, have an available bed and are willing to accept the patient.  Yes   Patient/family informed of [facility]'s ownership interest in [facility] and [facility], as well as of the fact that they are under no obligation to receive care at these facilities.  PASRR submitted to EDS on       PASRR number received on       Existing PASRR number confirmed on 08/27/15     FL2 transmitted to all facilities in geographic area requested by pt/family on 08/27/15     FL2 transmitted to all facilities within larger geographic area on       Patient informed that his/her managed care company has contracts with or will negotiate with certain facilities, including the following:        Yes   Patient/family informed of bed offers received.  Patient chooses bed at [place]     Physician recommends and patient chooses bed at      Patient to be transferred to [facility] on 08/28/15.  Patient to be transferred to facility by [facility]     Patient family notified on 08/28/15 of transfer.  Name of family member notified:  SON     PHYSICIAN Please sign FL2     Additional Comment: Pt / son are in  agreement with d/c to [place] today. [facility] transport is needed. Son is aware that out of pocket costs may be associated with [facility] transport. NSG reviewed d/c summary, scripts, AVS. Scripts included in d/c packet. D/C Summary sent to SNF for review prior to d/c.    _______________________________________________ Luretha Rued, [place] 08/28/2015, 4:45 PM

## 2015-08-28 NOTE — Progress Notes (Signed)
Patient very lethargic beginning of shift until MD seen patient around 11am. Patient was then drowsy but only oriented to self and place. No s/s of acute distress. Son at bedside. Patient could not eat breakfast or take morning medications due to lethargy but is now eating small amount for lunch and taking medications whole one at a time. Son states she is doing much better now than when she was admitted. Patient voids on bedpan. Will continue to monitor. To be discharged to SNF once medically clear.

## 2015-08-28 NOTE — Telephone Encounter (Signed)
Daughter notified that Dr Alvy Bimler has cancelled appt for tomorrow at [facility]. Pt being discharged today from hospital

## 2015-08-28 NOTE — Consult Note (Signed)
[facility] Face-to-Face Psychiatry Consult   Reason for Consult:  AMS and history of psychosis vs delusions Referring Physician:  Dr. Wendee Beavers Patient Identification: Victoria [name] MRN:  [id number] Principal Diagnosis: Psychotic disorder due to medical condition with delusions Diagnosis:   Patient Active Problem List   Diagnosis Date Noted  . Metabolic encephalopathy [O17.51] 08/24/2015  . Dysuria [R30.0] 08/08/2015  . Full code status [Z78.9] 07/02/2015  . Poor venous access [I87.8] 07/02/2015  . Palliative care encounter [Z51.5] 06/07/2015  . Weakness generalized [R53.1] 06/07/2015  . DNR (do not resuscitate) discussion [Z71.89] 06/07/2015  . Protein-calorie malnutrition, severe ([place]) [E43] 05/18/2015  . Hypoglycemia [E16.2] 05/16/2015  . Acute confusional state [F05] 05/14/2015  . Bilateral lower extremity edema [R60.0] 04/27/2015  . Conjunctivitis of left eye [H10.9] 03/30/2015  . Coronary artery calcification [I25.10, I25.84] 03/28/2015  . PVC's (premature ventricular contractions) [I49.3] 03/28/2015  . Weakness [R53.1] 03/28/2015  . Mild dementia [F03.90] 03/07/2015  . Protein calorie malnutrition ([place]) [E46] 03/02/2015  . Dehydration [E86.0] 02/16/2015  . Diarrhea due to drug [K52.1] 02/16/2015  . Leukopenia due to antineoplastic chemotherapy [D72.819, T45.1X5A] 02/01/2015  . Pancytopenia ([place]) [W25.852] 01/24/2015  . Hypokalemia [E87.6] 12/30/2014  . Multiple myeloma in relapse ([place]) [C90.02] 12/30/2014  . Dementia with behavioral disturbance [F03.91] 10/02/2014  . Hypercalcemia [E83.52] 07/11/2014  . ATAXIA [R27.9] 12/14/2009  . HEARING DEFICIT [H91.90] 05/31/2009  . Anxiety state [F41.1] 03/21/2008  . Anemia [D64.9] 03/08/2008  . INSOMNIA, PERSISTENT [G47.00] 03/08/2008  . 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 Essential hypertension [I10] 11/25/2007    Total Time spent with patient: 1 hour  Subjective:   [name] is a 81  y.o. female patient admitted with generalized weakness and decreased oral intake over few weeks.  HPI:  Victoria [name] is a 81 y.o. female  seen face-to-face for psychiatric consultation and evaluation and case discussed with Dr. Wendee Beavers and patient daughter Rashiya Lofland and son Cesia Orf who reported that patient has been suffering with increased generalized weakness, confusion, decreased oral intake and presented to hospital Dehydration. Patient has ,  some positive response with rehydration. Patient has been drinking about 120 mL of fluids and eating 25% of her meal tray yesterday and she refused to eat her breakfast this morning. Family reported patient was placed out of home for a couple of months and then came home 2 months ago. Patient has been deteriorating her function at home. Patient has been oriented to her name, being in hospital and able to tell me her name of the current President of the [place]. Patient has been withdrawn and not responding after a few minutes as she is being feeling tired and weak.  Patient has a history of multiple myeloma being followed by Dr.Gorsuch, presents to the hospital after some reports patient was having increasing confusion for the last 3 days which has been progressively getting worse. The problem has been persistent since onset. The patient has been eating less and less reportedly and today has not eating all that well. She also had difficulty working with physical therapy and reportedly was letting herself fall backwards. Much of the history is obtained from EMR and family at bedside as patient is not able to provide any history  Past Psychiatric History: patient has no previous acute psychiatric hospitalization but received medication during medical hospitalization and also recently evaluated by geriatric psychiatrist who recommended counseling secondary to multiple myeloma and she wants to talk to  a Social worker. It is not clear patient was able to keep her  outpatient counseling services are not at this time.  Risk to Self: Is patient at risk for suicide?: No Risk to Others:   Prior Inpatient Therapy:   Prior Outpatient Therapy:    Past Medical History:  Past Medical History  Diagnosis Date  . Hypertension   . Insomnia   . Anxiety   . Anemia   . Multiple myeloma     In remission  . Diverticulosis 04/2001  . Perforation bowel ([place])   . Ringing in ears     Wears hearing aides to drown out   . Rectal bleeding 02/03/2014  . Anemia in chronic illness 01/24/2015    Past Surgical History  Procedure Laterality Date  . Sigmoid resection / rectopexy  01/2011    perforation/stoma  . Colonoscopy    . Partial hysterectomy    . Cesarean section    . Colostomy takedown  06/27/11  . Cholecystectomy  2012    laparoscopic   Family History:  Family History  Problem Relation Age of Onset  . Hypertension Other   . Prostate cancer Father   . Colitis Neg Hx   . Esophageal cancer Neg Hx   . Stomach cancer Neg Hx    Family Psychiatric  History: patient has no family history of mental illness Social History:  History  Alcohol Use No     History  Drug Use No    Social History   Social History  . Marital Status: Divorced    Spouse Name: N/A  . Number of Children: 2  . Years of Education: N/A   Social History Main Topics  . Smoking status: Never Smoker   . Smokeless tobacco: Never Used  . Alcohol Use: No  . Drug Use: No  . Sexual Activity: No   Other Topics Concern  . None   Social History Narrative   Divorced.  Lives with daughter.  Normally ambulates without assistance.   Additional Social History: reportedly patient return from skilled nursing facility to home about 2 months ago. Patient daughter and son has been supportive to her.                           Allergies:   Allergies  Allergen Reactions  . Amoxicillin Hives and Itching    Has patient had a PCN reaction causing immediate rash, facial/tongue/throat  swelling, SOB or lightheadedness with hypotension: No Has patient had a PCN reaction causing severe rash involving mucus membranes or skin necrosis: No Has patient had a PCN reaction that required hospitalization No Has patient had a PCN reaction occurring within the last 10 years: No If all of the above answers are "NO", then may proceed with Cephalosporin use.  . Aspirin Other (See Comments)    Child hood allergy   . Atenolol     Hair loss  . Donepezil Other (See Comments)    agitation   . Doxycycline     Altered mental status   . Nortriptyline     agitation  . Amiodarone Rash  . Latex Dermatitis  . Zoloft [Sertraline Hcl] Anxiety    Increased agitation    Labs: No results found for this or any previous visit (from the past 48 hour(s)).  Current Facility-Administered Medications  Medication Dose Route Frequency Provider Last Rate Last Dose  . acetaminophen (TYLENOL) tablet 650 mg  650 mg Oral Q6H PRN [name],  MD   650 mg at 08/25/15 2039   Or  . acetaminophen (TYLENOL) suppository 650 mg  650 mg Rectal Q6H PRN [name], MD      . acyclovir (ZOVIRAX) tablet 400 mg  400 mg Oral Daily [name], MD   400 mg at 08/27/15 0924  . antiseptic oral rinse (CPC / CETYLPYRIDINIUM CHLORIDE 0.05%) solution 7 mL  7 mL Mouth Rinse q12n4p [name], MD   7 mL at 08/27/15 1200  . chlorhexidine (PERIDEX) 0.12 % solution 15 mL  15 mL Mouth Rinse BID [name], MD   15 mL at 08/27/15 2108  . dexamethasone (DECADRON) tablet 4 mg  4 mg Oral Daily [name], MD   4 mg at 08/27/15 0924  . dextrose 5 % and 0.45 % NaCl with KCl 20 mEq/L infusion   Intravenous Continuous [name], MD 100 mL/hr at 08/28/15 0815    . heparin injection 5,000 Units  5,000 Units Subcutaneous 3 times per day [name], MD   5,000 Units at 08/28/15 0605  . promethazine (PHENERGAN) tablet 12.5 mg  12.5 mg Oral Q6H PRN [name], MD      . sodium chloride 0.9 % injection 10-40 mL  10-40 mL  Intracatheter PRN [name], MD      . sodium chloride 0.9 % injection 3 mL  3 mL Intravenous Q12H [name], MD   3 mL at 08/27/15 2108   Facility-Administered Medications Ordered in Other Encounters  Medication Dose Route Frequency Provider Last Rate Last Dose  . 0.9 %  sodium chloride infusion   Intravenous Once Heath Lark, MD      . sodium chloride 0.9 % injection 10 mL  10 mL Intravenous PRN Heath Lark, MD   10 mL at 08/01/15 1405    Musculoskeletal: Strength & Muscle Tone: decreased Gait & Station: unable to stand Patient leans: N/A  Psychiatric Specialty Exam: ROS generalized weakness, decreased appetite denied nausea or vomiting abdominal pain, chest pain and shortness of breath   Blood pressure 138/57, pulse 71, temperature 98.8 F (37.1 C), temperature source Oral, resp. rate 20, height $RemoveBe'5\' 1"'lGDwJsKRN$  (1.549 m), weight 43.4 kg (95 lb 10.9 oz), SpO2 100 %.Body mass index is 18.09 kg/(m^2).  General Appearance: Guarded  Eye Contact::  Fair  Speech:  Slow  Volume:  Decreased  Mood:  Anxious and Depressed  Affect:  Constricted and Depressed  Thought Process:  Coherent and Goal Directed  Orientation:  Full (Time, Place, and Person)  Thought Content:  Rumination  Suicidal Thoughts:  No  Homicidal Thoughts:  No  Memory:  Immediate;   Fair Recent;   Poor  Judgement:  Impaired  Insight:  Shallow  Psychomotor Activity:  Psychomotor Retardation  Concentration:  Fair  Recall:  Poor  Fund of Knowledge:Fair  Language: Fair  Akathisia:  NA  Handed:  Right  AIMS (if indicated):     Assets:  Communication Skills Desire for Improvement Financial Resources/Insurance Housing Leisure Time Resilience Social Support Transportation  ADL's:  Impaired  Cognition: Impaired,  Mild  Sleep:      Treatment Plan Summary: Daily contact with patient to assess and evaluate symptoms and progress in treatment and Medication management  Case discussed with Dr. Wendee Beavers  We will check thyroid  hormones for possible thyroid abnormality We start Seroquel 25 mg twice daily which patient responded positively few months ago  Disposition: Patient does not meet criteria for psychiatric inpatient admission. Supportive therapy provided about ongoing stressors.  Patient may benefit from out-of-home placement when medically discharged from the hospital as she is not able to care for herself and not able to eat, drink and has sleep disturbance.   Mayu Ronk,JANARDHAHA R. 08/28/2015 9:57 AM

## 2015-08-29 ENCOUNTER — Encounter: Payer: Self-pay | Admitting: Nutrition

## 2015-08-29 ENCOUNTER — Telehealth: Payer: Self-pay | Admitting: Hematology and Oncology

## 2015-08-29 ENCOUNTER — Other Ambulatory Visit: Payer: Self-pay

## 2015-08-29 ENCOUNTER — Ambulatory Visit: Payer: Self-pay

## 2015-08-29 NOTE — Progress Notes (Signed)
Pt d/c to [facility] last night with a 2 day LOG approved by [employer], Director of [place].  Werner Lean LCSW [phone]

## 2015-08-29 NOTE — Telephone Encounter (Signed)
returned call and confirmed pt appt s.... °

## 2015-08-29 NOTE — Telephone Encounter (Signed)
Per pt dtr cx all appt.

## 2015-08-30 ENCOUNTER — Encounter: Payer: Self-pay | Admitting: Internal Medicine

## 2015-08-30 ENCOUNTER — Non-Acute Institutional Stay (SKILLED_NURSING_FACILITY): Payer: Medicare Other | Admitting: Internal Medicine

## 2015-08-30 ENCOUNTER — Telehealth: Payer: Self-pay | Admitting: Hematology and Oncology

## 2015-08-30 ENCOUNTER — Telehealth: Payer: Self-pay | Admitting: *Deleted

## 2015-08-30 DIAGNOSIS — E860 Dehydration: Secondary | ICD-10-CM

## 2015-08-30 DIAGNOSIS — R627 Adult failure to thrive: Secondary | ICD-10-CM | POA: Diagnosis not present

## 2015-08-30 DIAGNOSIS — F062 Psychotic disorder with delusions due to known physiological condition: Secondary | ICD-10-CM | POA: Diagnosis not present

## 2015-08-30 DIAGNOSIS — C9002 Multiple myeloma in relapse: Secondary | ICD-10-CM

## 2015-08-30 DIAGNOSIS — F0391 Unspecified dementia with behavioral disturbance: Secondary | ICD-10-CM | POA: Diagnosis not present

## 2015-08-30 DIAGNOSIS — B009 Herpesviral infection, unspecified: Secondary | ICD-10-CM

## 2015-08-30 DIAGNOSIS — F03918 Unspecified dementia, unspecified severity, with other behavioral disturbance: Secondary | ICD-10-CM

## 2015-08-30 NOTE — Telephone Encounter (Signed)
lvm for dt to confirm added lab

## 2015-08-30 NOTE — Progress Notes (Signed)
MRN: [id number] Name: Victoria [name]  Sex: female Age: 81 y.o. DOB: 1932/08/22  [place] #: Karren Burly Facility/Room:201 Level Of Care: SNF Provider: Inocencio Homes D Emergency Contacts: Extended Emergency Contact Information Primary Emergency Contact: Kunz,[name] Address: [address]          [place], [place] [address] Johnnette Litter of [place] Phone: [phone] Relation: Daughter Secondary Emergency Contact: Kandis Cocking [place] Mobile Phone: [phone] Relation: Son  Code Status:   Allergies: Amoxicillin; Aspirin; Atenolol; Donepezil; Doxycycline; Nortriptyline; Amiodarone; Latex; and Zoloft  Chief Complaint  Patient presents with  . New Admit To SNF    HPI: Patient is 81 y.o. female with history multiple myeloma followed by Dr. for such who presented to the hospital after developing increasing confusion for the last 3 days prior to admission. Reportedly patient was not eating or drinking well and presented with dehydration. Pt was admitted to [facility] from 11/25-29 where a sepsis work-up was negative.Marland KitchenPsych was consulted and started pt on seroquel.  Pt received IV hydration and when she was able to take po she was d/c to SNF.for generalized weakness and supportive care.While at SNF pt will be followed for Vit D def , tx with replacement, MM in relapse, tx with decadron, and  herpes infection prophylaxed with daily zovirax.  Past Medical History  Diagnosis Date  . Hypertension   . Insomnia   . Anxiety   . Anemia   . Multiple myeloma     In remission  . Diverticulosis 04/2001  . Perforation bowel ([place])   . Ringing in ears     Wears hearing aides to drown out   . Rectal bleeding 02/03/2014  . Anemia in chronic illness 01/24/2015    Past Surgical History  Procedure Laterality Date  . Sigmoid resection / rectopexy  01/2011    perforation/stoma  . Colonoscopy    . Partial hysterectomy    . Cesarean section    . Colostomy takedown  06/27/11  .  Cholecystectomy  2012    laparoscopic      Medication List       This list is accurate as of: 08/30/15 11:59 PM.  Always use your most recent med list.               acetaminophen 325 MG tablet  Commonly known as:  TYLENOL  Take 2 tablets (650 mg total) by mouth every 6 (six) hours as needed for mild pain (or Fever >/= 101).     acyclovir 400 MG tablet  Commonly known as:  ZOVIRAX  Take 1 tablet (400 mg total) by mouth daily.     cholecalciferol 1000 UNITS tablet  Commonly known as:  VITAMIN D  Take 2,000 Units by mouth daily.     dexamethasone 4 MG tablet  Commonly known as:  DECADRON  Take 1 tablet (4 mg total) by mouth daily.     lidocaine-prilocaine cream  Commonly known as:  EMLA  Apply to affected area once     MULTIVITAMIN & MINERAL PO  Take 1 tablet by mouth daily.     QUEtiapine 25 MG tablet  Commonly known as:  SEROQUEL  Take 1 tablet (25 mg total) by mouth 2 (two) times daily.     tobramycin 0.3 % ophthalmic solution  Commonly known as:  TOBREX  Place 1 drop into the left eye every 4 (four) hours.        No orders of the defined types were placed in this  encounter.    Immunization History  Administered Date(s) Administered  . Td 06/01/1996    Social History  Substance Use Topics  . Smoking status: Never Smoker   . Smokeless tobacco: Never Used  . Alcohol Use: No    Family history is + prostate CA, HTN  Review of Systems - UTO from pt; nursing reports she gets very aggitated with any handling by nursing     Filed Vitals:   09/01/15 1420  BP: 178/89  Pulse: 72  Temp: 97.4 F (36.3 C)  Resp: 18    SpO2 Readings from Last 1 Encounters:  08/28/15 97%        Physical Exam  GENERAL APPEARANCE: Alert, non conversant,  No acute distress.  SKIN: No diaphoresis rash HEAD: Normocephalic, atraumatic  EYES: Conjunctiva/lids clear. Pupils round, reactive. EOMs intact.  EARS: External exam WNL, canals clear. Hearing grossly normal.   NOSE: No deformity or discharge.  MOUTH/THROAT: Lips w/o lesions  RESPIRATORY: Breathing is even, unlabored. Lung sounds are clear   CARDIOVASCULAR: Heart RRR no murmurs, rubs or gallops. No peripheral edema.   GASTROINTESTINAL: Abdomen is soft, non-tender, not distended w/ normal bowel sounds. GENITOURINARY: Bladder non tender, not distended  MUSCULOSKELETAL: No abnormal joints or musculature NEUROLOGIC:  Cranial nerves 2-12 grossly intact. Moves all extremities  PSYCHIATRIC: seems afraid, pt started to get very upset at me putting steth on chest or move her covers  Patient Active Problem List   Diagnosis Date Noted  . Herpes infection 09/01/2015  . FTT (failure to thrive) in adult 09/01/2015  . Metabolic encephalopathy 30/01/1101  . Dysuria 08/08/2015  . Full code status 07/02/2015  . Poor venous access 07/02/2015  . Palliative care encounter 06/07/2015  . Weakness generalized 06/07/2015  . DNR (do not resuscitate) discussion 06/07/2015  . Protein-calorie malnutrition, severe ([place]) 05/18/2015  . Hypoglycemia 05/16/2015  . Acute confusional state 05/14/2015  . Bilateral lower extremity edema 04/27/2015  . Conjunctivitis of left eye 03/30/2015  . Coronary artery calcification 03/28/2015  . PVC's (premature ventricular contractions) 03/28/2015  . Weakness 03/28/2015  . Mild dementia 03/07/2015  . Protein calorie malnutrition ([place]) 03/02/2015  . Dehydration 02/16/2015  . Diarrhea due to drug 02/16/2015  . Leukopenia due to antineoplastic chemotherapy 02/01/2015  . Pancytopenia ([place]) 01/24/2015  . Hypokalemia 12/30/2014  . Multiple myeloma in relapse ([place]) 12/30/2014  . Dementia with behavioral disturbance 10/02/2014  . Hypercalcemia 07/11/2014  . ATAXIA 12/14/2009  . HEARING DEFICIT 05/31/2009  . Anxiety state 03/21/2008  . Anemia 03/08/2008  . INSOMNIA, PERSISTENT 03/08/2008  . 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 Essential  hypertension 11/25/2007    CBC    Component Value Date/Time   WBC 2.2* 08/25/2015 0425   WBC 3.2* 08/15/2015 0854   RBC 2.73* 08/25/2015 0425   RBC 2.88* 08/15/2015 0854   RBC 2.74* 12/30/2014 0721   HGB 8.1* 08/25/2015 0425   HGB 8.8* 08/15/2015 0854   HCT 26.7* 08/25/2015 0425   HCT 28.1* 08/15/2015 0854   PLT 53* 08/25/2015 0425   PLT 190 08/15/2015 0854   MCV 97.8 08/25/2015 0425   MCV 97.6 08/15/2015 0854   LYMPHSABS 0.6* 08/15/2015 0854   LYMPHSABS 0.7 07/16/2015 1337   MONOABS 0.5 08/15/2015 0854   MONOABS 0.4 07/16/2015 1337   EOSABS 0.0 08/15/2015 0854   EOSABS 0.0 07/16/2015 1337   BASOSABS 0.0 08/15/2015 0854   BASOSABS 0.0 07/16/2015 1337    CMP     Component  Value Date/Time   NA 138 08/25/2015 0425   NA 137 08/15/2015 0854   K 4.0 08/25/2015 0425   K 3.8 08/15/2015 0854   CL 106 08/25/2015 0425   CL 103 03/11/2013 1503   CO2 26 08/25/2015 0425   CO2 25 08/15/2015 0854   GLUCOSE 115* 08/25/2015 0425   GLUCOSE 77 08/15/2015 0854   GLUCOSE 91 03/11/2013 1503   BUN 27* 08/25/2015 0425   BUN 20.7 08/15/2015 0854   CREATININE 0.72 08/25/2015 0425   CREATININE 0.7 08/15/2015 0854   CALCIUM 8.9 08/25/2015 0425   CALCIUM 9.9 08/15/2015 0854   CALCIUM 11.4* 12/30/2014 0835   PROT 7.9 08/24/2015 1408   PROT 9.3* 08/15/2015 0854   ALBUMIN 2.5* 08/24/2015 1408   ALBUMIN 2.4* 08/15/2015 0854   AST 44* 08/24/2015 1408   AST 17 08/15/2015 0854   ALT 21 08/24/2015 1408   ALT 20 08/15/2015 0854   ALKPHOS 44 08/24/2015 1408   ALKPHOS 56 08/15/2015 0854   BILITOT 0.4 08/24/2015 1408   BILITOT 0.47 08/15/2015 0854   GFRNONAA >60 08/25/2015 0425   GFRAA >60 08/25/2015 0425    Lab Results  Component Value Date   HGBA1C 5.3 03/09/2007     Ct Head Wo Contrast  08/24/2015  CLINICAL DATA:  Altered mental status.  Multiple myeloma EXAM: CT HEAD WITHOUT CONTRAST TECHNIQUE: Contiguous axial images were obtained from the base of the skull through the vertex  without intravenous contrast. COMPARISON:  CT head 06/15/2015 FINDINGS: Generalized atrophy. Hypodensity in the cerebral white matter bilaterally unchanged compatible chronic microvascular ischemia. Negative for acute infarct.  Negative for hemorrhage or mass. Multiple low-density lesions throughout the calvarium consistent with multiple myeloma. These are unchanged. IMPRESSION: Atrophy and chronic microvascular ischemia.  No acute abnormality. Electronically Signed   By: Franchot Gallo M.D.   On: 08/24/2015 15:32   Dg Chest Portable 1 View  08/24/2015  CLINICAL DATA:  Altered mental status today upon waking up. Loss of appetite for 2 days. Headache. EXAM: PORTABLE CHEST 1 VIEW COMPARISON:  06/14/2015 FINDINGS: Power injectable Port-A-Cath tip: 3 vertebral body levels below the carina, just into the right atrium. Tortuosity and atherosclerotic calcification of the aortic arch. Reverse lordotic projection with low lung volumes. Bibasilar subsegmental atelectasis or scarring. Stable mildly indistinct pulmonary vasculature. Upper normal heart size. No overt edema. IMPRESSION: 1. Low lung volumes with mild bibasilar atelectasis or scarring. 2. Atherosclerotic aortic arch. 3. Right power injectable Port-A-Cath tip: Just into the right atrium, 3 vertebral levels below the carina. Electronically Signed   By: Van Clines M.D.   On: 08/24/2015 15:16   Dg Abd Portable 1v  08/24/2015  CLINICAL DATA:  81 year old female with multiple myeloma and altered mental status. EXAM: PORTABLE ABDOMEN - 1 VIEW COMPARISON:  None. FINDINGS: Nonobstructed bowel gas pattern. Colonic stool burden does not appear excessive. Multifocal lytic lesions throughout the skeletal structures the largest of which has largely replaced the right iliac wing. Findings are consistent with the clinical history of multiple myeloma. No large free air on this single supine radiograph. No abnormal calcifications. IMPRESSION: 1. Unremarkable bowel  gas pattern. 2. Known multiple myeloma with large lytic lesion replacing the majority of the right iliac wing. Electronically Signed   By: Jacqulynn Cadet M.D.   On: 08/24/2015 16:44    Not all labs, radiology exams or other studies done during hospitalization come through on my EPIC note; however they are reviewed by me.    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 - Concern was whether or not this was secondary to uncontrolled psychiatric condition. As such psychiatrist consulted and place patient on seroquel  SNF - there seems a possibility pt may be overmedicated, her affect is flat; I would like to decrease her seroquel to nightly but I don't want to start that before a weekend.  Dementia with behavioral disturbance SNF - with psychosis now; will consult psych service to follow  Dehydration SNF - resolved with IVF  Multiple myeloma in relapse SNF - pt reported to be not responding to treatment; pt's decadron was restarted early in Nov ;plan continue decadron  Herpes infection SNF - because of pancytopenia and tx pt is being prophylaxed daily with zovirax  FTT (failure to thrive) in adult SNF - from Hx in epic it appears pt has been failing since at least August; MM tx have not been working; pt appetite reported as poor and the few days pt has been in SNF she is not eating or drinking much; not sure of daughter wants Hospice   Time spent > 45 min; > 50% of time with patient was spent reviewing records, labs, tests and studies, counseling and developing plan of care  Hennie Duos, MD

## 2015-08-30 NOTE — Telephone Encounter (Signed)
Yes pls add labs

## 2015-08-30 NOTE — Telephone Encounter (Signed)
Daughter called to ask if pt needs lab appt before she sees Dr. Alvy Bimler next week on 12/7?  Says she usually has labs first?

## 2015-09-01 ENCOUNTER — Encounter: Payer: Self-pay | Admitting: Internal Medicine

## 2015-09-01 DIAGNOSIS — R627 Adult failure to thrive: Secondary | ICD-10-CM | POA: Insufficient documentation

## 2015-09-01 DIAGNOSIS — B009 Herpesviral infection, unspecified: Secondary | ICD-10-CM | POA: Insufficient documentation

## 2015-09-01 NOTE — Assessment & Plan Note (Signed)
SNF - from Hx in epic it appears pt has been failing since at least August; MM tx have not been working; pt appetite reported as poor and the few days pt has been in SNF she is not eating or drinking much; not sure of daughter wants Hospice

## 2015-09-01 NOTE — Assessment & Plan Note (Signed)
SNF - with psychosis now; will consult psych service to follow

## 2015-09-01 NOTE — Assessment & Plan Note (Signed)
SNF - because of pancytopenia and tx pt is being prophylaxed daily with zovirax

## 2015-09-01 NOTE — Assessment & Plan Note (Signed)
SNF - resolved with IVF

## 2015-09-01 NOTE — Assessment & Plan Note (Signed)
Workup negative improved with IV fluid rehydration as such most likely secondary to dehydration and poor oral intake. - Concern was whether or not this was secondary to uncontrolled psychiatric condition. As such psychiatrist consulted and place patient on seroquel  SNF - there seems a possibility pt may be overmedicated, her affect is flat; I would like to decrease her seroquel to nightly but I don't want to start that before a weekend.

## 2015-09-01 NOTE — Assessment & Plan Note (Signed)
SNF - pt reported to be not responding to treatment; pt's decadron was restarted early in Nov ;plan continue decadron

## 2015-09-04 ENCOUNTER — Non-Acute Institutional Stay (SKILLED_NURSING_FACILITY): Payer: Medicare Other | Admitting: Adult Health

## 2015-09-04 ENCOUNTER — Telehealth: Payer: Self-pay | Admitting: *Deleted

## 2015-09-04 DIAGNOSIS — R05 Cough: Secondary | ICD-10-CM | POA: Diagnosis not present

## 2015-09-04 DIAGNOSIS — R059 Cough, unspecified: Secondary | ICD-10-CM

## 2015-09-04 NOTE — Telephone Encounter (Signed)
LVM for pt's daughter to confirm pt's appts here tomorrow morning.  Asked daughter to call us back if pt will not be able to make it.

## 2015-09-05 ENCOUNTER — Ambulatory Visit: Payer: Self-pay

## 2015-09-05 ENCOUNTER — Other Ambulatory Visit: Payer: Self-pay

## 2015-09-05 ENCOUNTER — Ambulatory Visit (HOSPITAL_BASED_OUTPATIENT_CLINIC_OR_DEPARTMENT_OTHER): Payer: Medicare Other | Admitting: Hematology and Oncology

## 2015-09-05 ENCOUNTER — Encounter: Payer: Self-pay | Admitting: Hematology and Oncology

## 2015-09-05 ENCOUNTER — Ambulatory Visit: Payer: Medicare Other

## 2015-09-05 ENCOUNTER — Other Ambulatory Visit (HOSPITAL_BASED_OUTPATIENT_CLINIC_OR_DEPARTMENT_OTHER): Payer: Medicare Other

## 2015-09-05 VITALS — BP 119/52 | HR 76 | Temp 98.5°F | Resp 17 | Ht 61.0 in | Wt 83.7 lb

## 2015-09-05 DIAGNOSIS — E46 Unspecified protein-calorie malnutrition: Secondary | ICD-10-CM | POA: Diagnosis not present

## 2015-09-05 DIAGNOSIS — D61818 Other pancytopenia: Secondary | ICD-10-CM | POA: Diagnosis not present

## 2015-09-05 DIAGNOSIS — C9002 Multiple myeloma in relapse: Secondary | ICD-10-CM | POA: Diagnosis not present

## 2015-09-05 DIAGNOSIS — Z515 Encounter for palliative care: Secondary | ICD-10-CM

## 2015-09-05 DIAGNOSIS — Z95828 Presence of other vascular implants and grafts: Secondary | ICD-10-CM

## 2015-09-05 LAB — CBC & DIFF AND RETIC
BASO%: 0.4 % (ref 0.0–2.0)
Basophils Absolute: 0 10*3/uL (ref 0.0–0.1)
EOS%: 0.4 % (ref 0.0–7.0)
Eosinophils Absolute: 0 10*3/uL (ref 0.0–0.5)
HCT: 27.5 % — ABNORMAL LOW (ref 34.8–46.6)
HGB: 8.5 g/dL — ABNORMAL LOW (ref 11.6–15.9)
IMMATURE RETIC FRACT: 12 % — AB (ref 1.60–10.00)
LYMPH#: 0.4 10*3/uL — AB (ref 0.9–3.3)
LYMPH%: 16.3 % (ref 14.0–49.7)
MCH: 29.8 pg (ref 25.1–34.0)
MCHC: 30.9 g/dL — AB (ref 31.5–36.0)
MCV: 96.5 fL (ref 79.5–101.0)
MONO#: 0.2 10*3/uL (ref 0.1–0.9)
MONO%: 6.7 % (ref 0.0–14.0)
NEUT%: 76.2 % (ref 38.4–76.8)
NEUTROS ABS: 2.1 10*3/uL (ref 1.5–6.5)
PLATELETS: 298 10*3/uL (ref 145–400)
RBC: 2.85 10*6/uL — AB (ref 3.70–5.45)
RDW: 17.9 % — AB (ref 11.2–14.5)
RETIC %: 1.34 % (ref 0.70–2.10)
RETIC CT ABS: 38.19 10*3/uL (ref 33.70–90.70)
WBC: 2.7 10*3/uL — ABNORMAL LOW (ref 3.9–10.3)

## 2015-09-05 LAB — COMPREHENSIVE METABOLIC PANEL
ALT: 10 U/L (ref 0–55)
ANION GAP: 9 meq/L (ref 3–11)
AST: 14 U/L (ref 5–34)
Albumin: 2.2 g/dL — ABNORMAL LOW (ref 3.5–5.0)
Alkaline Phosphatase: 61 U/L (ref 40–150)
BILIRUBIN TOTAL: 0.96 mg/dL (ref 0.20–1.20)
BUN: 26.3 mg/dL — AB (ref 7.0–26.0)
CHLORIDE: 114 meq/L — AB (ref 98–109)
CO2: 22 meq/L (ref 22–29)
CREATININE: 0.8 mg/dL (ref 0.6–1.1)
Calcium: 9.4 mg/dL (ref 8.4–10.4)
EGFR: 75 mL/min/{1.73_m2} — ABNORMAL LOW (ref >90)
GLUCOSE: 73 mg/dL (ref 70–140)
Potassium: 3.6 mEq/L (ref 3.5–5.1)
SODIUM: 144 meq/L (ref 136–145)
TOTAL PROTEIN: 8.6 g/dL — AB (ref 6.4–8.3)

## 2015-09-05 MED ORDER — SODIUM CHLORIDE 0.9 % IJ SOLN
10.0000 mL | INTRAMUSCULAR | Status: DC | PRN
  Administered 2015-09-05: 10 mL
  Filled 2015-09-05: qty 10

## 2015-09-05 MED ORDER — SODIUM CHLORIDE 0.9 % IJ SOLN
10.0000 mL | INTRAMUSCULAR | Status: DC | PRN
  Administered 2015-09-05: 10 mL via INTRAVENOUS
  Filled 2015-09-05: qty 10

## 2015-09-05 MED ORDER — SODIUM CHLORIDE 0.9 % IV SOLN
Freq: Once | INTRAVENOUS | Status: AC
  Administered 2015-09-05: 10:00:00 via INTRAVENOUS

## 2015-09-05 MED ORDER — HEPARIN SOD (PORK) LOCK FLUSH 100 UNIT/ML IV SOLN
500.0000 [IU] | Freq: Once | INTRAVENOUS | Status: AC | PRN
  Administered 2015-09-05: 500 [IU]
  Filled 2015-09-05: qty 5

## 2015-09-05 MED ORDER — HEPARIN SOD (PORK) LOCK FLUSH 100 UNIT/ML IV SOLN
500.0000 [IU] | Freq: Once | INTRAVENOUS | Status: DC
  Filled 2015-09-05: qty 5

## 2015-09-05 NOTE — Assessment & Plan Note (Addendum)
This is likely due to anemia of chronic disease and bone marrow infiltration from multiple myeloma. She is not symptomatic. I recommend observation only. She is not a candidate for transfusion or treatment

## 2015-09-05 NOTE — Patient Instructions (Signed)

## 2015-09-05 NOTE — Assessment & Plan Note (Signed)
She have significant decline in performance status, untreated cancer and failure to thrive. I do not recommend further systemic treatment. The patient's daughter, Ebony Hail appears to be open to the idea/suggestion of hospice care and end-of-life care. However, when I discussed CODE STATUS, she could not agree changing her mother's CODE STATUS to DO NOT RESUSCITATE despite her significant decline in performance status. I would not recommend hospice care in this patient with full CODE STATUS. She wants to go home and think about it and discuss further with her other sibling, her brother regarding end-of-life care. As of right now, she has no further appointment to return back to the [facility]

## 2015-09-05 NOTE — Assessment & Plan Note (Signed)
She has progressive weight loss. Clinically, she appeared dehydrated today. Her daughter would like her to receive IV fluid support today. I will give her a bag of normal saline. I recommend her daughter to consider palliative care/hospice

## 2015-09-05 NOTE — Assessment & Plan Note (Signed)
The patient is very frail and had progressive decline in performance status. She has lost 10 pounds of weight due to anorexia. According to the patient, she has no access to food but according to her daughter this is not true. In any case, I would not recommend giving her chemotherapy as the patient have intermittent confusion episode and declining performance status. I recommend referral to hospice.

## 2015-09-05 NOTE — Patient Instructions (Signed)

## 2015-09-05 NOTE — Progress Notes (Signed)
[place] OFFICE PROGRESS NOTE  Patient Care Team: Heath Lark, MD as PCP - General (Hematology and Oncology) Lafayette Dragon, MD as Consulting Physician (Gastroenterology) Leonie Man, MD as Consulting Physician (Cardiology) Heath Lark, MD as Consulting Physician (Hematology and Oncology)  SUMMARY OF ONCOLOGIC HISTORY:   Multiple myeloma in relapse ([place])   12/30/2014 Initial Diagnosis Multiple myeloma in relapse   02/05/2015 Bone Marrow Biopsy BM biopsy was non-diagnostic. FISH was positive for loss of 17p and gain of chromosome 11   02/06/2015 - 04/30/2015 Chemotherapy She was started on Ninlaro, dexamethasone and Revlimid.   02/16/2015 Adverse Reaction Revlimid was placed on hold due to uncontrolled diarrhea.   05/08/2015 - 06/15/2015 Hospital Admission She has recurrent admissions to the hospital for failure to thrive   07/16/2015 Procedure She has port placement   07/18/2015 - 08/15/2015 Chemotherapy She received weekly Daratumumab   08/24/2015 - 08/28/2015 Hospital Admission She was admitted to the hospital for altered mental status     INTERVAL HISTORY: Please see below for problem oriented charting. The patient is very weak. She have poor appetite. She complained of feeling thirsty.she has not eaten anything today and claimed that she is not given food at the skilled facility She ruminates about headache. She claims she was not given access to fluids and was given treatment/injection at the skilled facility against her will. Further history is not possible from the patient as I believe she is not well and oriented  REVIEW OF SYSTEMS:  Unable to obtain due to the patient's altered mental status She denies pain. She claims she is hungry and thirsty All other systems were reviewed with the patient and are negative.  I have reviewed the past medical history, past surgical history, social history and family history with the patient and they are unchanged from previous  note.  ALLERGIES:  is allergic to amoxicillin; aspirin; atenolol; donepezil; doxycycline; nortriptyline; amiodarone; latex; and zoloft.  MEDICATIONS:  Current Outpatient Prescriptions  Medication Sig Dispense Refill  . acetaminophen (TYLENOL) 325 MG tablet Take 2 tablets (650 mg total) by mouth every 6 (six) hours as needed for mild pain (or Fever >/= 101).    Marland Kitchen acyclovir (ZOVIRAX) 400 MG tablet Take 1 tablet (400 mg total) by mouth daily. 60 tablet 11  . cholecalciferol (VITAMIN D) 1000 UNITS tablet Take 2,000 Units by mouth daily.    Marland Kitchen dexamethasone (DECADRON) 4 MG tablet Take 1 tablet (4 mg total) by mouth daily. 30 tablet 0  . lidocaine-prilocaine (EMLA) cream Apply to affected area once 30 g 3  . LORazepam (ATIVAN) 1 MG tablet Take 1 mg by mouth every 8 (eight) hours as needed. for anxiety  5  . Multiple Vitamins-Minerals (MULTIVITAMIN & MINERAL PO) Take 1 tablet by mouth daily.    . QUEtiapine (SEROQUEL) 25 MG tablet Take 1 tablet (25 mg total) by mouth 2 (two) times daily. 60 tablet 0   No current facility-administered medications for this visit.   Facility-Administered Medications Ordered in Other Visits  Medication Dose Route Frequency Provider Last Rate Last Dose  . 0.9 %  sodium chloride infusion   Intravenous Once Heath Lark, MD      . sodium chloride 0.9 % injection 10 mL  10 mL Intravenous PRN Heath Lark, MD   10 mL at 08/01/15 1405  . sodium chloride 0.9 % injection 10 mL  10 mL Intracatheter PRN Heath Lark, MD   10 mL at 09/05/15 1212    PHYSICAL  EXAMINATION: ECOG PERFORMANCE STATUS: 3 - Symptomatic, >50% confined to bed  Filed Vitals:   09/05/15 0933  BP: 119/52  Pulse: 76  Temp: 98.5 F (36.9 C)  Resp: 17   Filed Weights   09/05/15 0933  Weight: 83 lb 11.2 oz (37.966 kg)    GENERAL:alert, no distress and comfortable. She is frail, thin and cachectic SKIN: skin color, texture, turgor are normal, no rashes or significant lesions EYES: normal, Conjunctiva are  pink and non-injected, sclera clear OROPHARYNX:no exudate, no erythema and lips, buccal mucosa, and tongue normal . Dry mucous membrane is noted NEURO: alert but not oriented, with fluent speech, no focal motor/sensory deficits  LABORATORY DATA:  I have reviewed the data as listed    Component Value Date/Time   NA 144 09/05/2015 0904   NA 138 08/25/2015 0425   K 3.6 09/05/2015 0904   K 4.0 08/25/2015 0425   CL 106 08/25/2015 0425   CL 103 03/11/2013 1503   CO2 22 09/05/2015 0904   CO2 26 08/25/2015 0425   GLUCOSE 73 09/05/2015 0904   GLUCOSE 115* 08/25/2015 0425   GLUCOSE 91 03/11/2013 1503   BUN 26.3* 09/05/2015 0904   BUN 27* 08/25/2015 0425   CREATININE 0.8 09/05/2015 0904   CREATININE 0.72 08/25/2015 0425   CALCIUM 9.4 09/05/2015 0904   CALCIUM 8.9 08/25/2015 0425   CALCIUM 11.4* 12/30/2014 0835   PROT 8.6* 09/05/2015 0904   PROT 7.9 08/24/2015 1408   ALBUMIN 2.2* 09/05/2015 0904   ALBUMIN 2.5* 08/24/2015 1408   AST 14 09/05/2015 0904   AST 44* 08/24/2015 1408   ALT 10 09/05/2015 0904   ALT 21 08/24/2015 1408   ALKPHOS 61 09/05/2015 0904   ALKPHOS 44 08/24/2015 1408   BILITOT 0.96 09/05/2015 0904   BILITOT 0.4 08/24/2015 1408   GFRNONAA >60 08/25/2015 0425   GFRAA >60 08/25/2015 0425    No results found for: SPEP, UPEP  Lab Results  Component Value Date   WBC 2.7* 09/05/2015   NEUTROABS 2.1 09/05/2015   HGB 8.5* 09/05/2015   HCT 27.5* 09/05/2015   MCV 96.5 09/05/2015   PLT 298 09/05/2015      Chemistry      Component Value Date/Time   NA 144 09/05/2015 0904   NA 138 08/25/2015 0425   K 3.6 09/05/2015 0904   K 4.0 08/25/2015 0425   CL 106 08/25/2015 0425   CL 103 03/11/2013 1503   CO2 22 09/05/2015 0904   CO2 26 08/25/2015 0425   BUN 26.3* 09/05/2015 0904   BUN 27* 08/25/2015 0425   CREATININE 0.8 09/05/2015 0904   CREATININE 0.72 08/25/2015 0425      Component Value Date/Time   CALCIUM 9.4 09/05/2015 0904   CALCIUM 8.9 08/25/2015 0425    CALCIUM 11.4* 12/30/2014 0835   ALKPHOS 61 09/05/2015 0904   ALKPHOS 44 08/24/2015 1408   AST 14 09/05/2015 0904   AST 44* 08/24/2015 1408   ALT 10 09/05/2015 0904   ALT 21 08/24/2015 1408   BILITOT 0.96 09/05/2015 0904   BILITOT 0.4 08/24/2015 1408      ASSESSMENT & PLAN:  Multiple myeloma in relapse The patient is very frail and had progressive decline in performance status. She has lost 10 pounds of weight due to anorexia. According to the patient, she has no access to food but according to her daughter this is not true. In any case, I would not recommend giving her chemotherapy as the patient have intermittent confusion  episode and declining performance status. I recommend referral to hospice.  Pancytopenia ([place]) This is likely due to anemia of chronic disease and bone marrow infiltration from multiple myeloma. She is not symptomatic. I recommend observation only. She is not a candidate for transfusion or treatment  Protein calorie malnutrition ([place]) She has progressive weight loss. Clinically, she appeared dehydrated today. Her daughter would like her to receive IV fluid support today. I will give her a bag of normal saline. I recommend her daughter to consider palliative care/hospice  Palliative care encounter She have significant decline in performance status, untreated cancer and failure to thrive. I do not recommend further systemic treatment. The patient's daughter, Ebony Hail appears to be open to the idea/suggestion of hospice care and end-of-life care. However, when I discussed CODE STATUS, she could not agree changing her mother's CODE STATUS to DO NOT RESUSCITATE despite her significant decline in performance status. I would not recommend hospice care in this patient with full CODE STATUS. She wants to go home and think about it and discuss further with her other sibling, her brother regarding end-of-life care. As of right now, she has no further appointment to  return back to the [facility]   No orders of the defined types were placed in this encounter.   All questions were answered. The patient knows to call the clinic with any problems, questions or concerns. No barriers to learning was detected. I spent 25 minutes counseling the patient face to face. The total time spent in the appointment was 30 minutes and more than 50% was on counseling and review of test results     [facility], Augusto Deckman, MD 09/05/2015 1:23 PM

## 2015-09-06 ENCOUNTER — Telehealth: Payer: Self-pay | Admitting: *Deleted

## 2015-09-06 ENCOUNTER — Non-Acute Institutional Stay (SKILLED_NURSING_FACILITY): Payer: Medicare Other | Admitting: Adult Health

## 2015-09-06 ENCOUNTER — Telehealth: Payer: Self-pay

## 2015-09-06 DIAGNOSIS — C9002 Multiple myeloma in relapse: Secondary | ICD-10-CM

## 2015-09-06 DIAGNOSIS — Y95 Nosocomial condition: Principal | ICD-10-CM

## 2015-09-06 DIAGNOSIS — I10 Essential (primary) hypertension: Secondary | ICD-10-CM | POA: Diagnosis not present

## 2015-09-06 DIAGNOSIS — E46 Unspecified protein-calorie malnutrition: Secondary | ICD-10-CM

## 2015-09-06 DIAGNOSIS — J189 Pneumonia, unspecified organism: Secondary | ICD-10-CM | POA: Diagnosis not present

## 2015-09-06 DIAGNOSIS — F0391 Unspecified dementia with behavioral disturbance: Secondary | ICD-10-CM | POA: Diagnosis not present

## 2015-09-06 DIAGNOSIS — R270 Ataxia, unspecified: Secondary | ICD-10-CM | POA: Diagnosis not present

## 2015-09-06 DIAGNOSIS — R600 Localized edema: Secondary | ICD-10-CM | POA: Diagnosis not present

## 2015-09-06 DIAGNOSIS — F4323 Adjustment disorder with mixed anxiety and depressed mood: Secondary | ICD-10-CM

## 2015-09-06 MED ORDER — MEGESTROL ACETATE 40 MG PO TABS: 40.0000 mg | ORAL_TABLET | Freq: Two times a day (BID) | ORAL | Status: DC

## 2015-09-06 NOTE — Telephone Encounter (Signed)
TC from pt's daughter called asking about medication to stimulate her mother's appetite.  Daughter forgot to ask at appointment yesterday.

## 2015-09-06 NOTE — Telephone Encounter (Signed)
She is already on dexamethasone Other choice would be megace but carries a high risk of blood clots so I would not recommend unless in the setting of hospice If she still wants her to try then start at 40 mg BID PO

## 2015-09-06 NOTE — Telephone Encounter (Signed)
Victoria [name] called re apetite stimulant. Relayed Dr Alvy Bimler message about megace and high risk of clots. Victoria [name] stated she would like the prescription sent to [facility] [address]. She is very worried about her mother's lack of appetite. Rx sent for 2 weeks supply.

## 2015-09-07 ENCOUNTER — Encounter: Payer: Self-pay | Admitting: Adult Health

## 2015-09-07 ENCOUNTER — Emergency Department (HOSPITAL_COMMUNITY): Payer: Medicare Other

## 2015-09-07 ENCOUNTER — Inpatient Hospital Stay (HOSPITAL_COMMUNITY)
Admission: EM | Admit: 2015-09-07 | Discharge: 2015-09-11 | DRG: 202 | Disposition: A | Discharge status: Skilled Nursing Facility | Payer: Medicare Other | Attending: Internal Medicine | Admitting: Internal Medicine

## 2015-09-07 ENCOUNTER — Other Ambulatory Visit: Payer: Self-pay

## 2015-09-07 ENCOUNTER — Encounter (HOSPITAL_COMMUNITY): Payer: Self-pay | Admitting: *Deleted

## 2015-09-07 DIAGNOSIS — Z8249 Family history of ischemic heart disease and other diseases of the circulatory system: Secondary | ICD-10-CM

## 2015-09-07 DIAGNOSIS — J189 Pneumonia, unspecified organism: Secondary | ICD-10-CM | POA: Insufficient documentation

## 2015-09-07 DIAGNOSIS — J9691 Respiratory failure, unspecified with hypoxia: Secondary | ICD-10-CM

## 2015-09-07 DIAGNOSIS — R627 Adult failure to thrive: Secondary | ICD-10-CM | POA: Diagnosis not present

## 2015-09-07 DIAGNOSIS — F0391 Unspecified dementia with behavioral disturbance: Secondary | ICD-10-CM | POA: Diagnosis present

## 2015-09-07 DIAGNOSIS — Z515 Encounter for palliative care: Secondary | ICD-10-CM | POA: Diagnosis not present

## 2015-09-07 DIAGNOSIS — E43 Unspecified severe protein-calorie malnutrition: Secondary | ICD-10-CM | POA: Diagnosis present

## 2015-09-07 DIAGNOSIS — K5900 Constipation, unspecified: Secondary | ICD-10-CM | POA: Diagnosis not present

## 2015-09-07 DIAGNOSIS — Y95 Nosocomial condition: Principal | ICD-10-CM

## 2015-09-07 DIAGNOSIS — E878 Other disorders of electrolyte and fluid balance, not elsewhere classified: Secondary | ICD-10-CM | POA: Diagnosis present

## 2015-09-07 DIAGNOSIS — D630 Anemia in neoplastic disease: Secondary | ICD-10-CM | POA: Diagnosis present

## 2015-09-07 DIAGNOSIS — Z9049 Acquired absence of other specified parts of digestive tract: Secondary | ICD-10-CM

## 2015-09-07 DIAGNOSIS — R109 Unspecified abdominal pain: Secondary | ICD-10-CM

## 2015-09-07 DIAGNOSIS — C9002 Multiple myeloma in relapse: Secondary | ICD-10-CM | POA: Diagnosis not present

## 2015-09-07 DIAGNOSIS — Z79899 Other long term (current) drug therapy: Secondary | ICD-10-CM | POA: Diagnosis not present

## 2015-09-07 DIAGNOSIS — Z681 Body mass index (BMI) 19 or less, adult: Secondary | ICD-10-CM

## 2015-09-07 DIAGNOSIS — F03918 Unspecified dementia, unspecified severity, with other behavioral disturbance: Secondary | ICD-10-CM | POA: Diagnosis present

## 2015-09-07 DIAGNOSIS — E876 Hypokalemia: Secondary | ICD-10-CM | POA: Diagnosis present

## 2015-09-07 DIAGNOSIS — Z66 Do not resuscitate: Secondary | ICD-10-CM | POA: Diagnosis present

## 2015-09-07 DIAGNOSIS — F419 Anxiety disorder, unspecified: Secondary | ICD-10-CM | POA: Diagnosis present

## 2015-09-07 DIAGNOSIS — E870 Hyperosmolality and hypernatremia: Secondary | ICD-10-CM | POA: Diagnosis not present

## 2015-09-07 DIAGNOSIS — I472 Ventricular tachycardia: Secondary | ICD-10-CM | POA: Diagnosis present

## 2015-09-07 DIAGNOSIS — J209 Acute bronchitis, unspecified: Secondary | ICD-10-CM | POA: Diagnosis present

## 2015-09-07 DIAGNOSIS — R509 Fever, unspecified: Secondary | ICD-10-CM | POA: Diagnosis not present

## 2015-09-07 DIAGNOSIS — J9621 Acute and chronic respiratory failure with hypoxia: Secondary | ICD-10-CM | POA: Diagnosis not present

## 2015-09-07 DIAGNOSIS — R0600 Dyspnea, unspecified: Secondary | ICD-10-CM

## 2015-09-07 DIAGNOSIS — E860 Dehydration: Secondary | ICD-10-CM | POA: Diagnosis not present

## 2015-09-07 LAB — CBC WITH DIFFERENTIAL/PLATELET
BASOS ABS: 0 10*3/uL (ref 0.0–0.1)
BASOS PCT: 0 %
EOS ABS: 0 10*3/uL (ref 0.0–0.7)
Eosinophils Relative: 0 %
HCT: 26.3 % — ABNORMAL LOW (ref 36.0–46.0)
Hemoglobin: 8.2 g/dL — ABNORMAL LOW (ref 12.0–15.0)
LYMPHS ABS: 0.7 10*3/uL (ref 0.7–4.0)
LYMPHS PCT: 12 %
MCH: 29.9 pg (ref 26.0–34.0)
MCHC: 31.2 g/dL (ref 30.0–36.0)
MCV: 96 fL (ref 78.0–100.0)
MONOS PCT: 5 %
Monocytes Absolute: 0.3 10*3/uL (ref 0.1–1.0)
NEUTROS PCT: 83 %
Neutro Abs: 4.6 10*3/uL (ref 1.7–7.7)
Platelets: 279 10*3/uL (ref 150–400)
RBC: 2.74 MIL/uL — AB (ref 3.87–5.11)
RDW: 17.6 % — AB (ref 11.5–15.5)
WBC: 5.5 10*3/uL (ref 4.0–10.5)

## 2015-09-07 LAB — COMPREHENSIVE METABOLIC PANEL
ALT: 16 U/L (ref 14–54)
AST: 20 U/L (ref 15–41)
Albumin: 2.6 g/dL — ABNORMAL LOW (ref 3.5–5.0)
Alkaline Phosphatase: 57 U/L (ref 38–126)
Anion gap: 8 (ref 5–15)
BUN: 30 mg/dL — ABNORMAL HIGH (ref 6–20)
CHLORIDE: 113 mmol/L — AB (ref 101–111)
CO2: 25 mmol/L (ref 22–32)
CREATININE: 0.89 mg/dL (ref 0.44–1.00)
Calcium: 9.1 mg/dL (ref 8.9–10.3)
GFR, EST NON AFRICAN AMERICAN: 59 mL/min — AB (ref >60)
Glucose, Bld: 128 mg/dL — ABNORMAL HIGH (ref 65–99)
POTASSIUM: 3.5 mmol/L (ref 3.5–5.1)
SODIUM: 146 mmol/L — AB (ref 135–145)
Total Bilirubin: 0.9 mg/dL (ref 0.3–1.2)
Total Protein: 8.3 g/dL — ABNORMAL HIGH (ref 6.5–8.1)

## 2015-09-07 LAB — SPEP & IFE WITH QIG
ABNORMAL PROTEIN BAND1: 2.8 g/dL
ALBUMIN ELP: 2.6 g/dL — AB (ref 3.8–4.8)
ALPHA-1-GLOBULIN: 0.5 g/dL — AB (ref 0.2–0.3)
ALPHA-2-GLOBULIN: 1.1 g/dL — AB (ref 0.5–0.9)
BETA GLOBULIN: 0.3 g/dL — AB (ref 0.4–0.6)
Beta 2: 0.3 g/dL (ref 0.2–0.5)
GAMMA GLOBULIN: 2.9 g/dL — AB (ref 0.8–1.7)
IGG (IMMUNOGLOBIN G), SERUM: 2810 mg/dL — AB (ref 690–1700)
IgA: 8 mg/dL — ABNORMAL LOW (ref 69–380)
IgM, Serum: <5 mg/dL — ABNORMAL LOW (ref 52–322)
TOTAL PROTEIN, SERUM ELECTROPHOR: 7.7 g/dL (ref 6.1–8.1)

## 2015-09-07 LAB — URINALYSIS, ROUTINE W REFLEX MICROSCOPIC
BILIRUBIN URINE: NEGATIVE
GLUCOSE, UA: NEGATIVE mg/dL
KETONES UR: NEGATIVE mg/dL
Nitrite: NEGATIVE
PH: 7.5 (ref 5.0–8.0)
Protein, ur: 100 mg/dL — AB
SPECIFIC GRAVITY, URINE: 1.016 (ref 1.005–1.030)

## 2015-09-07 LAB — MRSA PCR SCREENING: MRSA BY PCR: NEGATIVE

## 2015-09-07 LAB — URINE MICROSCOPIC-ADD ON

## 2015-09-07 LAB — KAPPA/LAMBDA LIGHT CHAINS
Kappa free light chain: 1.24 mg/dL (ref 0.33–1.94)
Kappa:Lambda Ratio: 0.01 — ABNORMAL LOW (ref 0.26–1.65)
Lambda Free Lght Chn: 197 mg/dL — ABNORMAL HIGH (ref 0.57–2.63)

## 2015-09-07 MED ORDER — VANCOMYCIN HCL 500 MG IV SOLR
500.0000 mg | Freq: Once | INTRAVENOUS | Status: DC
  Administered 2015-09-07: 500 mg via INTRAVENOUS
  Filled 2015-09-07: qty 500

## 2015-09-07 MED ORDER — IPRATROPIUM-ALBUTEROL 0.5-2.5 (3) MG/3ML IN SOLN: 3.0000 mL | Freq: Four times a day (QID) | RESPIRATORY_TRACT | Status: DC | PRN

## 2015-09-07 MED ORDER — MEGESTROL ACETATE 40 MG PO TABS
40.0000 mg | ORAL_TABLET | Freq: Two times a day (BID) | ORAL | Status: DC
  Administered 2015-09-07: 40 mg via ORAL
  Filled 2015-09-07 (×3): qty 1

## 2015-09-07 MED ORDER — QUETIAPINE FUMARATE 50 MG PO TABS
25.0000 mg | ORAL_TABLET | Freq: Two times a day (BID) | ORAL | Status: DC
  Administered 2015-09-07: 25 mg via ORAL
  Filled 2015-09-07: qty 1

## 2015-09-07 MED ORDER — SODIUM CHLORIDE 0.9 % IV BOLUS (SEPSIS)
500.0000 mL | Freq: Once | INTRAVENOUS | Status: AC
  Administered 2015-09-07: 500 mL via INTRAVENOUS

## 2015-09-07 MED ORDER — ACYCLOVIR 400 MG PO TABS
400.0000 mg | ORAL_TABLET | Freq: Every day | ORAL | Status: DC
  Administered 2015-09-08 – 2015-09-11 (×4): 400 mg via ORAL
  Filled 2015-09-07 (×5): qty 1

## 2015-09-07 MED ORDER — DEXTROSE 5 % IV SOLN
1.0000 g | Freq: Three times a day (TID) | INTRAVENOUS | Status: DC
  Administered 2015-09-08: 1 g via INTRAVENOUS
  Filled 2015-09-07 (×2): qty 1

## 2015-09-07 MED ORDER — HEPARIN SODIUM (PORCINE) 5000 UNIT/ML IJ SOLN
5000.0000 [IU] | Freq: Three times a day (TID) | INTRAMUSCULAR | Status: DC
  Administered 2015-09-07 – 2015-09-11 (×11): 5000 [IU] via SUBCUTANEOUS
  Filled 2015-09-07 (×11): qty 1

## 2015-09-07 MED ORDER — SODIUM CHLORIDE 0.9 % IV SOLN
INTRAVENOUS | Status: DC
  Administered 2015-09-07 – 2015-09-08 (×2): via INTRAVENOUS

## 2015-09-07 MED ORDER — LORAZEPAM 1 MG PO TABS
1.0000 mg | ORAL_TABLET | Freq: Three times a day (TID) | ORAL | Status: DC | PRN
  Administered 2015-09-08: 1 mg via ORAL
  Filled 2015-09-07 (×2): qty 1

## 2015-09-07 MED ORDER — ENOXAPARIN SODIUM 40 MG/0.4ML ~~LOC~~ SOLN: 40.0000 mg | SUBCUTANEOUS | Status: DC

## 2015-09-07 MED ORDER — LORAZEPAM 2 MG/ML IJ SOLN
0.2500 mg | Freq: Four times a day (QID) | INTRAMUSCULAR | Status: DC | PRN
  Administered 2015-09-08 – 2015-09-11 (×4): 0.5 mg via INTRAVENOUS
  Filled 2015-09-07 (×4): qty 1

## 2015-09-07 MED ORDER — DEXTROSE 5 % IV SOLN: 2.0000 g | Freq: Three times a day (TID) | INTRAVENOUS | Status: DC

## 2015-09-07 MED ORDER — GUAIFENESIN 100 MG/5ML PO SOLN: 20.0000 mL | Freq: Four times a day (QID) | ORAL | Status: DC | PRN

## 2015-09-07 MED ORDER — VANCOMYCIN HCL 500 MG IV SOLR: 500.0000 mg | INTRAVENOUS | Status: DC

## 2015-09-07 MED ORDER — DEXTROSE 5 % IV SOLN
1.0000 g | Freq: Once | INTRAVENOUS | Status: AC
  Administered 2015-09-07: 1 g via INTRAVENOUS
  Filled 2015-09-07: qty 1

## 2015-09-07 MED ORDER — VANCOMYCIN HCL 500 MG IV SOLR
500.0000 mg | INTRAVENOUS | Status: DC
  Filled 2015-09-07: qty 500

## 2015-09-07 MED ORDER — GUAIFENESIN ER 600 MG PO TB12
600.0000 mg | ORAL_TABLET | Freq: Two times a day (BID) | ORAL | Status: DC
  Administered 2015-09-07 – 2015-09-11 (×8): 600 mg via ORAL
  Filled 2015-09-07 (×8): qty 1

## 2015-09-07 MED ORDER — CIPROFLOXACIN HCL 500 MG PO TABS: 500.0000 mg | ORAL_TABLET | Freq: Two times a day (BID) | ORAL | Status: DC

## 2015-09-07 NOTE — Consult Note (Signed)
Consultation Note Date: 09/07/2015   Patient Name: Victoria [name]  DOB: Jul 20, 1932  MRN: [id number]  Age / Sex: 81 y.o., female  PCP: Heath Lark, MD Referring Physician: Davonna Belling, MD  Reason for Consultation: Establishing goals of care, Non pain symptom management and Psychosocial/spiritual support    Clinical Assessment/Narrative: Pt is a 81 yo female with h/o multiple myeloma x 9 years who is admitted from SNF with low grade fevers and SHOB. Pt, per daughter had a 5 year h/o of MM being in remission. Daughter struggling with her mother's decline. She worries that her mother not be "treated as a number". Describes her mother as defying odds in the past and as a Nurse, adult; "she would like to go out like that". Goal of this initial consult was to maintain a relationship with daughter to assist her and her mother moving forward as her condition declines. Per chart review, pt and daughter met with Dr. Alvy Bimler her oncologist who recommended hospice at their last visit.   Contacts/Participants in Discussion: Primary Decision Maker:Victoria [name] Relationship to Patient daughter HCPOA: yes  Pt also has a son who lives in [place] full scope of treatment for now Spent lengthy time in supportive listening Discussed full picture of her mother in terms of weight loss, low albumin, worsening resp status, increased weakness Will continue to follow over the weekend to assist pt and family with [place]  Code Status/Advance Care Planning: Full code    Code Status Orders        Start     Ordered   09/07/15 1724  Full code   Continuous     09/07/15 1723    Advance Directive Documentation        Most Recent Value   Type of Advance Directive  Healthcare Power of Attorney   Pre-existing out of facility DNR order (yellow form or pink MOST form)     "MOST" Form in Place?         Other Directives:None  Symptom Management:   Dyspnea: Cont targeted pulmonary treatment  Anxiety: Will add ativan 0.25-0.5 q 6 prn  Palliative Prophylaxis:   Bowel Regimen, Delirium Protocol and Turn Reposition  Additional Recommendations (Limitations, Scope, Preferences):  Full Scope Treatment   Psycho-social/Spiritual:  Support System: Adequate Desire for further Chaplaincy support:no   Prognosis: Per objective data, pt does meet hospice criteria with a prognosis of 6 months or less barring an acute event. Did introduce hospice as another source of support. Pt does not have to be a DNR to be a hospice pt  Discharge Planning: [facility] for rehab with Palliative care service follow-up   Chief Complaint/ Primary Diagnoses: Present on Admission:  **None**  I have reviewed the medical record, interviewed the patient and family, and examined the patient. The following aspects are pertinent.  Past Medical History  Diagnosis Date  . Hypertension   . Insomnia   . Anxiety   . Anemia   . Diverticulosis 04/2001  . Perforation bowel ([place])   . Ringing in ears     Wears hearing aides to drown out   . Rectal bleeding 02/03/2014  . Anemia in chronic illness 01/24/2015  . Multiple myeloma     In remission   Social History   Social History  . Marital Status: Divorced    Spouse Name: N/A  . Number of Children: 2  . Years of Education: N/A   Social History Main Topics  .  Smoking status: Never Smoker   . Smokeless tobacco: Never Used  . Alcohol Use: No  . Drug Use: No  . Sexual Activity: No   Other Topics Concern  . None   Social History Narrative   Divorced.  Lives with daughter.  Normally ambulates without assistance.   Family History  Problem Relation Age of Onset  . Hypertension Other   . Prostate cancer Father   . Colitis Neg Hx   . Esophageal cancer Neg Hx   . Stomach cancer Neg Hx    Scheduled Meds:  Continuous Infusions:  PRN  Meds:.LORazepam Medications Prior to Admission:  Prior to Admission medications   Medication Sig Start Date End Date Taking? Authorizing Provider  acetaminophen (TYLENOL) 325 MG tablet Take 2 tablets (650 mg total) by mouth every 6 (six) hours as needed for mild pain (or Fever >/= 101). 08/28/15  Yes [name], MD  acyclovir (ZOVIRAX) 400 MG tablet Take 1 tablet (400 mg total) by mouth daily. 07/02/15  Yes Heath Lark, MD  cholecalciferol (VITAMIN D) 1000 UNITS tablet Take 2,000 Units by mouth daily.   Yes Historical Provider, MD  ciprofloxacin (CIPRO) 500 MG tablet Take 1 tablet (500 mg total) by mouth 2 (two) times daily. 09/06/15 09/16/15 Yes Gerlene Fee, NP  dexamethasone (DECADRON) 4 MG tablet Take 1 tablet (4 mg total) by mouth daily. 07/25/15  Yes Heath Lark, MD  guaiFENesin (ROBITUSSIN) 100 MG/5ML SOLN Take 20 mLs (400 mg total) by mouth every 6 (six) hours as needed for cough or to loosen phlegm. 09/07/15  Yes Gerlene Fee, NP  lidocaine-prilocaine (EMLA) cream Apply to affected area once 07/02/15  Yes Heath Lark, MD  LORazepam (ATIVAN) 1 MG tablet Take 1 mg by mouth every 8 (eight) hours as needed. for anxiety 07/30/15  Yes Historical Provider, MD  megestrol (MEGACE) 40 MG tablet Take 1 tablet (40 mg total) by mouth 2 (two) times daily. 09/06/15  Yes Heath Lark, MD  Multiple Vitamins-Minerals (MULTIVITAMIN & MINERAL PO) Take 1 tablet by mouth daily.   Yes Historical Provider, MD  QUEtiapine (SEROQUEL) 25 MG tablet Take 1 tablet (25 mg total) by mouth 2 (two) times daily. 08/28/15  Yes [name], MD   Allergies  Allergen Reactions  . Amoxicillin Hives and Itching    Has patient had a PCN reaction causing immediate rash, facial/tongue/throat swelling, SOB or lightheadedness with hypotension: No Has patient had a PCN reaction causing severe rash involving mucus membranes or skin necrosis: No Has patient had a PCN reaction that required hospitalization No Has patient had a PCN  reaction occurring within the last 10 years: No If all of the above answers are "NO", then may proceed with Cephalosporin use.  . Aspirin Other (See Comments)    Child hood allergy   . Atenolol     Hair loss  . Donepezil Other (See Comments)    agitation   . Doxycycline     Altered mental status   . Nortriptyline     agitation  . Amiodarone Rash  . Latex Dermatitis  . Zoloft [Sertraline Hcl] Anxiety    Increased agitation    Review of Systems  Unable to perform ROS: Mental status change    Physical Exam  Constitutional:  Elderly acutely ill appearing female cachetic  Respiratory:  Increased work of breathing  Neurological: She is alert.  Oriented to self; knows her daughter who is at the bedsdie  Skin: Skin is warm and dry.  Psychiatric:  Pleasantly confused    Vital Signs: BP 131/77 mmHg  Pulse 92  Temp(Src) 98.4 F (36.9 C) (Oral)  Resp 23  SpO2 99%  SpO2: SpO2: 99 % O2 Device:SpO2: 99 % O2 Flow Rate: .O2 Flow Rate (L/min): 3 L/min  IO: Intake/output summary: No intake or output data in the 24 hours ending 09/07/15 1802  LBM:   Baseline Weight:   Most recent weight:        Palliative Assessment/Data:  Flowsheet Rows        Most Recent Value   Intake Tab    Referral Department  Trauma   Unit at Time of Referral  ER   Palliative Care Primary Diagnosis  Pulmonary   Date Notified  09/07/15   Palliative Care Type  New Palliative care   Date of Admission  09/07/15   Date first seen by Palliative Care  09/07/15   # of days Palliative referral response time  0 Day(s)   # of days IP prior to Palliative referral  0   Clinical Assessment    Palliative Performance Scale Score  30%   Pain Max last 24 hours  Other (Comment)   Pain Min Last 24 hours  Other (Comment)   Dyspnea Max Last 24 Hours  Other (Comment)   Dyspnea Min Last 24 hours  Other (Comment)   Nausea Max Last 24 Hours  Other (Comment)   Nausea Min Last 24 Hours  Other (Comment)   Anxiety Max  Last 24 Hours  Other (Comment)   Anxiety Min Last 24 Hours  Other (Comment)   Other Max Last 24 Hours  Other (Comment)   Psychosocial & Spiritual Assessment    Palliative Care Outcomes    Patient/Family meeting held?  Yes   Palliative Care follow-up planned  Yes, Facility      Additional Data Reviewed:  CBC:    Component Value Date/Time   WBC 5.5 09/07/2015 1610   WBC 2.7* 09/05/2015 0903   HGB 8.2* 09/07/2015 1610   HGB 8.5* 09/05/2015 0903   HCT 26.3* 09/07/2015 1610   HCT 27.5* 09/05/2015 0903   PLT 279 09/07/2015 1610   PLT 298 09/05/2015 0903   MCV 96.0 09/07/2015 1610   MCV 96.5 09/05/2015 0903   NEUTROABS 4.6 09/07/2015 1610   NEUTROABS 2.1 09/05/2015 0903   LYMPHSABS 0.7 09/07/2015 1610   LYMPHSABS 0.4* 09/05/2015 0903   MONOABS 0.3 09/07/2015 1610   MONOABS 0.2 09/05/2015 0903   EOSABS 0.0 09/07/2015 1610   EOSABS 0.0 09/05/2015 0903   BASOSABS 0.0 09/07/2015 1610   BASOSABS 0.0 09/05/2015 0903   Comprehensive Metabolic Panel:    Component Value Date/Time   NA 146* 09/07/2015 1610   NA 144 09/05/2015 0904   K 3.5 09/07/2015 1610   K 3.6 09/05/2015 0904   CL 113* 09/07/2015 1610   CL 103 03/11/2013 1503   CO2 25 09/07/2015 1610   CO2 22 09/05/2015 0904   BUN 30* 09/07/2015 1610   BUN 26.3* 09/05/2015 0904   CREATININE 0.89 09/07/2015 1610   CREATININE 0.8 09/05/2015 0904   GLUCOSE 128* 09/07/2015 1610   GLUCOSE 73 09/05/2015 0904   GLUCOSE 91 03/11/2013 1503   CALCIUM 9.1 09/07/2015 1610   CALCIUM 9.4 09/05/2015 0904   CALCIUM 11.4* 12/30/2014 0835   AST 20 09/07/2015 1610   AST 14 09/05/2015 0904   ALT 16 09/07/2015 1610   ALT 10 09/05/2015 0904   ALKPHOS 57 09/07/2015 1610   ALKPHOS 61 09/05/2015  0904   BILITOT 0.9 09/07/2015 1610   BILITOT 0.96 09/05/2015 0904   PROT 8.3* 09/07/2015 1610   PROT 8.6* 09/05/2015 0904   ALBUMIN 2.6* 09/07/2015 1610   ALBUMIN 2.2* 09/05/2015 0904     Time In: 1630 Time Out: 1815 Time Total: 105 Greater  than 50%  of this time was spent counseling and coordinating care related to the above assessment and plan. Staffed with ED physician, Dr. Alvino Chapel  Signed by: Dory Horn, NP  Dory Horn, NP  09/07/2015, 6:02 PM  Please contact Palliative Medicine Team phone at [phone] for questions and concerns.

## 2015-09-07 NOTE — ED Notes (Signed)
Patient presents to the ED from [facility] home with complaints of hypoxia (90% on RA) since earlier today and fever (Tmax 99) since yesterday.  Patient has had cough productive of sputum since Monday past.  Patient denies nasal congestion, post-nasal drip, ear ache and sore throat.  Patient denies chest pain, abdominal pain and N/V/D.    General: Awake, alert, undernourished, NAD HEENT:Buccal mucosa dry, lips chapped and cracked Cardio: Rate irregular.  S1S2 heard, no murmurs appreciated, no edema, cyanosis or clubbing; +2 radial pulses bilaterally Pulm: clear diminished NEURO:  PERRL, arcus senilis bilat

## 2015-09-07 NOTE — ED Provider Notes (Signed)
CSN: [id number]     Arrival date & time 09/07/15  1443 History   First MD Initiated Contact with Patient 09/07/15 1505     Chief Complaint  Patient presents with  . Fever     (Consider location/radiation/quality/duration/timing/severity/associated sxs/prior Treatment) Patient is a 81 y.o. female presenting with fever. The history is provided by the patient.  Fever  patient presents to the ER with her daughter. Has had fevers and an been diagnosed with pneumonia. She is at a nursing home. She's been started on Cipro. Daughter brings her into the ER. States she's been doing worse. States it took 3 days for them to get chest x-ray so she brought her here. Has had heart rates reportedly up to 140. Patient has some level of dementia. History of multiple myeloma and reviewing history hospice has been recommended. No dysuria. Oxygen levels have been 89 or 90. She is not on oxygen at baseline. Patient's daughter states that she does not want her going back to [place]. States she will take her home instead. States she is our department to have her go somewhere else. Patient is reportedly eating less.   Past Medical History  Diagnosis Date  . Hypertension   . Insomnia   . Anxiety   . Anemia   . Diverticulosis 04/2001  . Perforation bowel ([place])   . Ringing in ears     Wears hearing aides to drown out   . Rectal bleeding 02/03/2014  . Anemia in chronic illness 01/24/2015  . Multiple myeloma     In remission   Past Surgical History  Procedure Laterality Date  . Sigmoid resection / rectopexy  01/2011    perforation/stoma  . Colonoscopy    . Partial hysterectomy    . Cesarean section    . Colostomy takedown  06/27/11  . Cholecystectomy  2012    laparoscopic   Family History  Problem Relation Age of Onset  . Hypertension Other   . Prostate cancer Father   . Colitis Neg Hx   . Esophageal cancer Neg Hx   . Stomach cancer Neg Hx    Social History  Substance Use Topics  . Smoking status:  Never Smoker   . Smokeless tobacco: Never Used  . Alcohol Use: No   OB History    No data available     Review of Systems  Unable to perform ROS: Dementia  Constitutional: Positive for fever and appetite change.      Allergies  Amoxicillin; Aspirin; Atenolol; Donepezil; Doxycycline; Nortriptyline; Amiodarone; Latex; and Zoloft  Home Medications   Prior to Admission medications   Medication Sig Start Date End Date Taking? Authorizing Provider  acetaminophen (TYLENOL) 325 MG tablet Take 2 tablets (650 mg total) by mouth every 6 (six) hours as needed for mild pain (or Fever >/= 101). 08/28/15  Yes [name], MD  acyclovir (ZOVIRAX) 400 MG tablet Take 1 tablet (400 mg total) by mouth daily. 07/02/15  Yes Heath Lark, MD  cholecalciferol (VITAMIN D) 1000 UNITS tablet Take 2,000 Units by mouth daily.   Yes Historical Provider, MD  ciprofloxacin (CIPRO) 500 MG tablet Take 1 tablet (500 mg total) by mouth 2 (two) times daily. 09/06/15 09/16/15 Yes Gerlene Fee, NP  dexamethasone (DECADRON) 4 MG tablet Take 1 tablet (4 mg total) by mouth daily. 07/25/15  Yes Heath Lark, MD  guaiFENesin (ROBITUSSIN) 100 MG/5ML SOLN Take 20 mLs (400 mg total) by mouth every 6 (six) hours as needed for cough or to  loosen phlegm. 09/07/15  Yes Gerlene Fee, NP  lidocaine-prilocaine (EMLA) cream Apply to affected area once 07/02/15  Yes Heath Lark, MD  LORazepam (ATIVAN) 1 MG tablet Take 1 mg by mouth every 8 (eight) hours as needed. for anxiety 07/30/15  Yes Historical Provider, MD  megestrol (MEGACE) 40 MG tablet Take 1 tablet (40 mg total) by mouth 2 (two) times daily. 09/06/15  Yes Heath Lark, MD  Multiple Vitamins-Minerals (MULTIVITAMIN & MINERAL PO) Take 1 tablet by mouth daily.   Yes Historical Provider, MD  QUEtiapine (SEROQUEL) 25 MG tablet Take 1 tablet (25 mg total) by mouth 2 (two) times daily. 08/28/15  Yes [name], MD   BP 147/94 mmHg  Pulse 98  Temp(Src) 98.6 F (37 C) (Oral)  Resp 23   Ht 5' 2.5" (1.588 m)  Wt 83 lb 1.8 oz (37.7 kg)  BMI 14.95 kg/m2  SpO2 100% Physical Exam  Constitutional: She appears well-developed.  HENT:  Mucous membranes are dry and cracked  Eyes: Pupils are equal, round, and reactive to light.  Neck: Neck supple.  Cardiovascular:  Tachycardia.  Pulmonary/Chest: Effort normal.  portacath to right chest wall  Abdominal: Soft. There is no tenderness.  Musculoskeletal: She exhibits no edema.  Neurological: She is alert.  Mild confusion  Skin: Skin is warm.    ED Course  Procedures (including critical care time) Labs Review Labs Reviewed  COMPREHENSIVE METABOLIC PANEL - Abnormal; Notable for the following:    Sodium 146 (*)    Chloride 113 (*)    Glucose, Bld 128 (*)    BUN 30 (*)    Total Protein 8.3 (*)    Albumin 2.6 (*)    GFR calc non Af Amer 59 (*)    All other components within normal limits  CBC WITH DIFFERENTIAL/PLATELET - Abnormal; Notable for the following:    RBC 2.74 (*)    Hemoglobin 8.2 (*)    HCT 26.3 (*)    RDW 17.6 (*)    All other components within normal limits  URINALYSIS, ROUTINE W REFLEX MICROSCOPIC (NOT AT [facility]) - Abnormal; Notable for the following:    APPearance CLOUDY (*)    Hgb urine dipstick MODERATE (*)    Protein, ur 100 (*)    Leukocytes, UA MODERATE (*)    All other components within normal limits  URINE MICROSCOPIC-ADD ON - Abnormal; Notable for the following:    Squamous Epithelial / LPF 0-5 (*)    Bacteria, UA FEW (*)    All other components within normal limits  MRSA PCR SCREENING  HIV ANTIBODY (ROUTINE TESTING)    Imaging Review Dg Chest 2 View  09/07/2015  CLINICAL DATA:  Dry cough for 2-3 months. EXAM: CHEST  2 VIEW COMPARISON:  08/24/2015 FINDINGS: There are low lung volumes. There is bilateral interstitial thickening. There is no focal consolidation. There is no pleural effusion or pneumothorax. There is stable cardiomegaly. There is thoracic aortic atherosclerosis. There is a  right-sided Port-A-Cath in satisfactory position. There is a healing right posterior sixth rib fracture. IMPRESSION: Stable cardiomegaly with mild pulmonary vascular congestion likely superimposed upon chronic interstitial disease. Electronically Signed   By: Kathreen Devoid   On: 09/07/2015 16:00   I have personally reviewed and evaluated these images and lab results as part of my medical decision-making.   EKG Interpretation   Date/Time:  Friday September 07 2015 15:26:38 EST Ventricular Rate:  113 PR Interval:  133 QRS Duration: 88 QT Interval:  307  QTC Calculation: 421 R Axis:   61 Text Interpretation:  Sinus tachycardia Atrial premature complexes  Confirmed by Alvino Chapel  MD, Ovid Curd [phone]) on 09/07/2015 3:44:32 PM      MDM   Final diagnoses:  Dehydration  Respiratory failure with hypoxia, unspecified chronicity (HCC)  FTT (failure to thrive) in adult    Patient with fever. Currently treated for pneumonia but x-rays reassuring here. Also appears rather dehydrated. Mucous membranes are dry. Worsening renal function. Overall patient is a very poor prognosis. Hospice has been suggested by primary but family is not willing yet. Seen by palliative care in the ER. Will admit to internal medicine for IV fluids and continue palliative care consult.    Davonna Belling, MD 09/08/15 [phone]

## 2015-09-07 NOTE — ED Notes (Signed)
Per EMS - patient comes from [place].  Staff at [facility] did not have complaints, they are treating patient to PNA.  Daughter wanted patient to be transported to ED.  Patient's O2 on RA was 90%, but rose to 97% on 3L with decreased WOB.  Patient's vitals 140/80, HR 94.

## 2015-09-07 NOTE — ED Notes (Signed)
Bed: WA06 Expected date:  Expected time:  Means of arrival:  Comments: Ems-elderly, fever, ? pna

## 2015-09-07 NOTE — Progress Notes (Signed)
Patient ID: Victoria [name], female   DOB: 1932-06-26, 81 y.o.   MRN: [id number]    Facility:  [facility]      Allergies  Allergen Reactions  . Amoxicillin Hives and Itching    Has patient had a PCN reaction causing immediate rash, facial/tongue/throat swelling, SOB or lightheadedness with hypotension: No Has patient had a PCN reaction causing severe rash involving mucus membranes or skin necrosis: No Has patient had a PCN reaction that required hospitalization No Has patient had a PCN reaction occurring within the last 10 years: No If all of the above answers are "NO", then may proceed with Cephalosporin use.  . Aspirin Other (See Comments)    Child hood allergy   . Atenolol     Hair loss  . Donepezil Other (See Comments)    agitation   . Doxycycline     Altered mental status   . Nortriptyline     agitation  . Amiodarone Rash  . Latex Dermatitis  . Zoloft [Sertraline Hcl] Anxiety    Increased agitation    Chief Complaint  Patient presents with  . Acute Visit    cough and chest x-ray     HPI:  She is having a low grade temp of 99.6. She continues to cough. Her daughter is concerned about pneumonia. There are no reports of any change in her appetite   Past Medical History  Diagnosis Date  . Hypertension   . Insomnia   . Anxiety   . Anemia   . Multiple myeloma     In remission  . Diverticulosis 04/2001  . Perforation bowel ([place])   . Ringing in ears     Wears hearing aides to drown out   . Rectal bleeding 02/03/2014  . Anemia in chronic illness 01/24/2015    Past Surgical History  Procedure Laterality Date  . Sigmoid resection / rectopexy  01/2011    perforation/stoma  . Colonoscopy    . Partial hysterectomy    . Cesarean section    . Colostomy takedown  06/27/11  . Cholecystectomy  2012    laparoscopic    VITAL SIGNS BP 119/79 mmHg  Pulse 80  Ht $R'5\' 1"'Eo$  (1.549 m)  Wt 89 lb (40.37 kg)  BMI 16.83 kg/m2  Patient's Medications  New Prescriptions   No  medications on file  Previous Medications   ACETAMINOPHEN (TYLENOL) 325 MG TABLET    Take 2 tablets (650 mg total) by mouth every 6 (six) hours as needed for mild pain (or Fever >/= 101).   ACYCLOVIR (ZOVIRAX) 400 MG TABLET    Take 1 tablet (400 mg total) by mouth daily.   CHOLECALCIFEROL (VITAMIN D) 1000 UNITS TABLET    Take 2,000 Units by mouth daily.   DEXAMETHASONE (DECADRON) 4 MG TABLET    Take 1 tablet (4 mg total) by mouth daily.   LIDOCAINE-PRILOCAINE (EMLA) CREAM    Apply to affected area once   LORAZEPAM (ATIVAN) 1 MG TABLET    Take 1 mg by mouth every 8 (eight) hours as needed. for anxiety   MEGESTROL (MEGACE) 40 MG TABLET    Take 1 tablet (40 mg total) by mouth 2 (two) times daily.   MULTIPLE VITAMINS-MINERALS (MULTIVITAMIN & MINERAL PO)    Take 1 tablet by mouth daily.   QUETIAPINE (SEROQUEL) 25 MG TABLET    Take 1 tablet (25 mg total) by mouth 2 (two) times daily.  Modified Medications   No medications on file  Discontinued Medications  No medications on file     SIGNIFICANT DIAGNOSTIC EXAMS   08-24-15: chest x-ray: 1. Low lung volumes with mild bibasilar atelectasis or scarring. 2. Atherosclerotic aortic arch. 3. Right power injectable Port-A-Cath tip: Just into the right atrium, 3 vertebral levels below the carina.  08-24-15: ct of head: Atrophy and chronic microvascular ischemia.  No acute abnormality.  09-06-15: chest x-ray:  Minimal right lower lobe infiltrate     LABS REVIEWED:   08-25-15: wbc 2.2; hgb 8.1; hct 26.7; mcv 97.8; plt 53; glucose 115; bun 27; creat 0.72; k+ 4.0; na++138 08-27-15: HIV: nr; RPR: nr; Vit B12: 596; folate 25.0; ammonia 21 08-28-15: tsh 1.640; free T4: 1.06    Review of Systems  Constitutional: Negative for malaise/fatigue.  Respiratory: Positive for cough and shortness of breath.   Cardiovascular: Negative for chest pain.  Gastrointestinal: Negative for abdominal pain.  Musculoskeletal: Negative for myalgias.  Skin: Negative.    Psychiatric/Behavioral: The patient is not nervous/anxious.     Physical Exam  Constitutional: No distress.  frail  Eyes: Conjunctivae are normal.  Neck: Neck supple. No JVD present. No thyromegaly present.  Cardiovascular: Normal rate, regular rhythm and intact distal pulses.   Respiratory: Effort normal. No respiratory distress. She has no wheezes.  Few scattered crackles   GI: Soft. Bowel sounds are normal. She exhibits no distension. There is no tenderness.  Musculoskeletal: She exhibits no edema.  Able to move all extremities   Lymphadenopathy:    She has no cervical adenopathy.  Neurological: She is alert.  Skin: Skin is warm and dry. She is not diaphoretic.  Psychiatric: She has a normal mood and affect.      ASSESSMENT/ PLAN:  1. Pneumonia: will begin her on cipro 500 mg twice daily for 10 days; will monitor her status.      Ok Edwards NP [facility]  Contact [phone] Monday through Friday 8am- 5pm  After hours call [phone]

## 2015-09-07 NOTE — Progress Notes (Signed)
ANTIBIOTIC CONSULT NOTE - INITIAL  Pharmacy Consult for vancomycin, renal adj (Azactam) Indication: pneumonia  Allergies  Allergen Reactions  . Amoxicillin Hives and Itching    Has patient had a PCN reaction causing immediate rash, facial/tongue/throat swelling, SOB or lightheadedness with hypotension: No Has patient had a PCN reaction causing severe rash involving mucus membranes or skin necrosis: No Has patient had a PCN reaction that required hospitalization No Has patient had a PCN reaction occurring within the last 10 years: No If all of the above answers are "NO", then may proceed with Cephalosporin use.  . Aspirin Other (See Comments)    Child hood allergy   . Atenolol     Hair loss  . Donepezil Other (See Comments)    agitation   . Doxycycline     Altered mental status   . Nortriptyline     agitation  . Amiodarone Rash  . Latex Dermatitis  . Zoloft [Sertraline Hcl] Anxiety    Increased agitation    Patient Measurements:    Vital Signs: Temp: 98.4 F (36.9 C) (12/09 1749) Temp Source: Oral (12/09 1749) BP: 131/77 mmHg (12/09 1749) Pulse Rate: 92 (12/09 1749) Intake/Output from previous day:   Intake/Output from this shift:    Labs:  Recent Labs  09/05/15 0903 09/05/15 0904 09/07/15 1610  WBC 2.7*  --  5.5  HGB 8.5*  --  8.2*  PLT 298  --  279  CREATININE  --  0.8 0.89   Estimated Creatinine Clearance: 29.8 mL/min (by C-G formula based on Cr of 0.89). No results for input(s): VANCOTROUGH, VANCOPEAK, VANCORANDOM, GENTTROUGH, GENTPEAK, GENTRANDOM, TOBRATROUGH, TOBRAPEAK, TOBRARND, AMIKACINPEAK, AMIKACINTROU, AMIKACIN in the last 72 hours.   Microbiology: No results found for this or any previous visit (from the past 720 hour(s)).  Medical History: Past Medical History  Diagnosis Date  . Hypertension   . Insomnia   . Anxiety   . Anemia   . Diverticulosis 04/2001  . Perforation bowel ([place])   . Ringing in ears     Wears hearing aides to drown  out   . Rectal bleeding 02/03/2014  . Anemia in chronic illness 01/24/2015  . Multiple myeloma     In remission    Medications:  Scheduled:  . acyclovir  400 mg Oral Daily  . guaiFENesin  600 mg Oral BID  . megestrol  40 mg Oral BID  . QUEtiapine  25 mg Oral BID   Infusions:  . sodium chloride    . aztreonam     Assessment: 81 yo female from SNF with hx MM, dementia, HTN recently admitted to hospital for confusion to start vancomycin and azactam for HAP. Baseline labs: WBC WNL, SCr 0.89 CrCl 30 ml/min, and afebrile  Goal of Therapy:  Vancomycin trough level 15-20 mcg/ml  Plan:  1) Vancomycin $RemoveBefor'500mg'[id number]$  IV q24 based on renal function and weight 2) Adjust aztreonam from 2g IV q8 to 1g IV q8 for CrCl 10-30 ml/min   Adrian Saran, PharmD, BCPS Pager [phone] 09/07/2015 6:46 PM

## 2015-09-07 NOTE — Progress Notes (Signed)
Patient ID: Victoria [name], female   DOB: 02-01-1932, 81 y.o.   MRN: [id number]    Facility:  [facility]       Allergies  Allergen Reactions  . Amoxicillin Hives and Itching    Has patient had a PCN reaction causing immediate rash, facial/tongue/throat swelling, SOB or lightheadedness with hypotension: No Has patient had a PCN reaction causing severe rash involving mucus membranes or skin necrosis: No Has patient had a PCN reaction that required hospitalization No Has patient had a PCN reaction occurring within the last 10 years: No If all of the above answers are "NO", then may proceed with Cephalosporin use.  . Aspirin Other (See Comments)    Child hood allergy   . Atenolol     Hair loss  . Donepezil Other (See Comments)    agitation   . Doxycycline     Altered mental status   . Nortriptyline     agitation  . Amiodarone Rash  . Latex Dermatitis  . Zoloft [Sertraline Hcl] Anxiety    Increased agitation    Chief Complaint  Patient presents with  . Acute Visit    cough     HPI:  Her family reports that she had a cough yesterday; but today she is seems better. There are no reports of fever present. There are no reports of changes in appetite.   Past Medical History  Diagnosis Date  . Hypertension   . Insomnia   . Anxiety   . Anemia   . Multiple myeloma     In remission  . Diverticulosis 04/2001  . Perforation bowel ([place])   . Ringing in ears     Wears hearing aides to drown out   . Rectal bleeding 02/03/2014  . Anemia in chronic illness 01/24/2015    Past Surgical History  Procedure Laterality Date  . Sigmoid resection / rectopexy  01/2011    perforation/stoma  . Colonoscopy    . Partial hysterectomy    . Cesarean section    . Colostomy takedown  06/27/11  . Cholecystectomy  2012    laparoscopic    VITAL SIGNS BP 130/78 mmHg  Pulse 79  Ht _0  (1.549 m)  Wt 84 lb (38.102 kg)  BMI 15.88 kg/m2  SpO2 96%  Patient's Medications  New Prescriptions   No medications on file  Previous Medications   ACETAMINOPHEN (TYLENOL) 325 MG TABLET    Take 2 tablets (650 mg total) by mouth every 6 (six) hours as needed for mild pain (or Fever >/= 101).   ACYCLOVIR (ZOVIRAX) 400 MG TABLET    Take 1 tablet (400 mg total) by mouth daily.   CHOLECALCIFEROL (VITAMIN D) 1000 UNITS TABLET    Take 2,000 Units by mouth daily.   DEXAMETHASONE (DECADRON) 4 MG TABLET    Take 1 tablet (4 mg total) by mouth daily.   LIDOCAINE-PRILOCAINE (EMLA) CREAM    Apply to affected area once   LORAZEPAM (ATIVAN) 1 MG TABLET    Take 1 mg by mouth every 8 (eight) hours as needed. for anxiety   MEGESTROL (MEGACE) 40 MG TABLET    Take 1 tablet (40 mg total) by mouth 2 (two) times daily.   MULTIPLE VITAMINS-MINERALS (MULTIVITAMIN & MINERAL PO)    Take 1 tablet by mouth daily.   QUETIAPINE (SEROQUEL) 25 MG TABLET    Take 1 tablet (25 mg total) by mouth 2 (two) times daily.  Modified Medications   No medications on file  Discontinued  Medications   No medications on file     SIGNIFICANT DIAGNOSTIC EXAMS  08-24-15: chest x-ray: 1. Low lung volumes with mild bibasilar atelectasis or scarring. 2. Atherosclerotic aortic arch. 3. Right power injectable Port-A-Cath tip: Just into the right atrium, 3 vertebral levels below the carina.  08-24-15: ct of head: Atrophy and chronic microvascular ischemia.  No acute abnormality.    LABS REVIEWED:   08-25-15: wbc 2.2; hgb 8.1; hct 26.7; mcv 97.8; plt 53; glucose 115; bun 27; creat 0.72; k+ 4.0; na++138 08-27-15: HIV: nr; RPR: nr; Vit B12: 596; folate 25.0; ammonia 21 08-28-15: tsh 1.640; free T4: 1.06  Review of Systems  Constitutional: Negative for malaise/fatigue.  Respiratory: Positive for cough and shortness of breath.   Cardiovascular: Negative for chest pain.  Gastrointestinal: Negative for abdominal pain.  Musculoskeletal: Negative for myalgias.  Skin: Negative.   Psychiatric/Behavioral: The patient is not nervous/anxious.      Physical Exam  Constitutional: No distress.  frail  Eyes: Conjunctivae are normal.  Neck: Neck supple. No JVD present. No thyromegaly present.  Cardiovascular: Normal rate, regular rhythm and intact distal pulses.   Respiratory: Effort normal. No respiratory distress. She has no wheezes.  Few scattered crackles   GI: Soft. Bowel sounds are normal. She exhibits no distension. There is no tenderness.  Musculoskeletal: She exhibits no edema.  Able to move all extremities   Lymphadenopathy:    She has no cervical adenopathy.  Neurological: She is alert.  Skin: Skin is warm and dry. She is not diaphoretic.  Psychiatric: She has a normal mood and affect.        ASSESSMENT/ PLAN:   1. Cough: will have use incentive spirometry every 4 hours while awake and will use robitussin 20 cc every 6 hours as needed and will monitor    Ok Edwards NP [facility]  Contact [phone] Monday through Friday 8am- 5pm  After hours call [phone]

## 2015-09-07 NOTE — H&P (Addendum)
[facility] History and Physical  Victoria [name] JSE:[id number] DOB: 19-Jun-1932 DOA: 09/07/2015   PCP: Heath Lark, MD    Chief Complaint: brought in by daughter for "pneumonia"   HPI: Victoria [name] is a 81 y.o. female with multiple myeloma in remission, dementia, hypertension who was recently admitted to the hospital on 11/25 for confusion. She was found to be dehydrated and was given IV fluid which resulted in some improvement in her mental status. The patient was discharged to skilled nursing facility. At the facility she is recently developed a cough and was started on ciprofloxacin however the cough has progressed area and she is again noted to be severely dehydrated and apparently has continued to have a poor by mouth intake. The daughter states that the CXR at the SNF showed a pneumonia. CXR today does not reveal infiltrates. The patient is quite confused and unable to give me a history.     ROS: unable to obtain due to severe confusion  Past Medical History  Diagnosis Date  . Hypertension   . Insomnia   . Anxiety   . Anemia   . Diverticulosis 04/2001  . Perforation bowel ([place])   . Ringing in ears     Wears hearing aides to drown out   . Rectal bleeding 02/03/2014  . Anemia in chronic illness 01/24/2015  . Multiple myeloma     In remission    Past Surgical History  Procedure Laterality Date  . Sigmoid resection / rectopexy  01/2011    perforation/stoma  . Colonoscopy    . Partial hysterectomy    . Cesarean section    . Colostomy takedown  06/27/11  . Cholecystectomy  2012    laparoscopic    Social History: does not smoke or drink Lives at SNF, prior to last hospitalization, was at home with daughter Victoria [name].     Allergies  Allergen Reactions  . Amoxicillin Hives and Itching    Has patient had a PCN reaction causing immediate rash, facial/tongue/throat swelling, SOB or lightheadedness with hypotension: No Has patient had a PCN reaction causing severe rash  involving mucus membranes or skin necrosis: No Has patient had a PCN reaction that required hospitalization No Has patient had a PCN reaction occurring within the last 10 years: No If all of the above answers are "NO", then may proceed with Cephalosporin use.  . Aspirin Other (See Comments)    Child hood allergy   . Atenolol     Hair loss  . Donepezil Other (See Comments)    agitation   . Doxycycline     Altered mental status   . Nortriptyline     agitation  . Amiodarone Rash  . Latex Dermatitis  . Zoloft [Sertraline Hcl] Anxiety    Increased agitation    Family history:   Family History  Problem Relation Age of Onset  . Hypertension Other   . Prostate cancer Father   . Colitis Neg Hx   . Esophageal cancer Neg Hx   . Stomach cancer Neg Hx       Prior to Admission medications   Medication Sig Start Date End Date Taking? Authorizing Provider  acetaminophen (TYLENOL) 325 MG tablet Take 2 tablets (650 mg total) by mouth every 6 (six) hours as needed for mild pain (or Fever >/= 101). 08/28/15  Yes [name], MD  acyclovir (ZOVIRAX) 400 MG tablet Take 1 tablet (400 mg total) by mouth daily. 07/02/15  Yes Heath Lark, MD  cholecalciferol (VITAMIN  D) 1000 UNITS tablet Take 2,000 Units by mouth daily.   Yes Historical Provider, MD  ciprofloxacin (CIPRO) 500 MG tablet Take 1 tablet (500 mg total) by mouth 2 (two) times daily. 09/06/15 09/16/15 Yes Gerlene Fee, NP  dexamethasone (DECADRON) 4 MG tablet Take 1 tablet (4 mg total) by mouth daily. 07/25/15  Yes Heath Lark, MD  guaiFENesin (ROBITUSSIN) 100 MG/5ML SOLN Take 20 mLs (400 mg total) by mouth every 6 (six) hours as needed for cough or to loosen phlegm. 09/07/15  Yes Gerlene Fee, NP  lidocaine-prilocaine (EMLA) cream Apply to affected area once 07/02/15  Yes Heath Lark, MD  LORazepam (ATIVAN) 1 MG tablet Take 1 mg by mouth every 8 (eight) hours as needed. for anxiety 07/30/15  Yes Historical Provider, MD  megestrol (MEGACE)  40 MG tablet Take 1 tablet (40 mg total) by mouth 2 (two) times daily. 09/06/15  Yes Heath Lark, MD  Multiple Vitamins-Minerals (MULTIVITAMIN & MINERAL PO) Take 1 tablet by mouth daily.   Yes Historical Provider, MD  QUEtiapine (SEROQUEL) 25 MG tablet Take 1 tablet (25 mg total) by mouth 2 (two) times daily. 08/28/15  Yes [name], MD     Physical Exam: Filed Vitals:   09/07/15 1454 09/07/15 1749  BP: 137/61 131/77  Pulse: 131 92  Temp: 98.2 F (36.8 C) 98.4 F (36.9 C)  TempSrc: Oral Oral  Resp: 18 23  SpO2: 99% 99%     General: confused but alert and conversant. Very frail. No distress.  HEENT: Normocephalic and Atraumatic, Mucous membranes pink                PERRLA; EOM intact; No scleral icterus,                 Nares: Patent, Oropharynx: Clear, Fair Dentition                 Neck: FROM, no cervical lymphadenopathy, thyromegaly, carotid bruit or JVD;  Breasts: deferred CHEST WALL: No tenderness  CHEST: Normal respiration, congestion/ rhonchi- no wheezing or crackles.  HEART: Regular rate and rhythm; no murmurs rubs or gallops  BACK: No kyphosis or scoliosis; no CVA tenderness  GI: Positive Bowel Sounds, soft, non-tender; no masses, no organomegaly Rectal Exam: deferred MSK: No cyanosis, clubbing, or edema Genitalia: not examined  SKIN:  no rash or ulceration  CNS: Alert and Oriented x 4, Nonfocal exam, CN 2-12 intact  Labs on Admission:  Basic Metabolic Panel:  Recent Labs Lab 09/05/15 0904 09/07/15 1610  NA 144 146*  K 3.6 3.5  CL  --  113*  CO2 22 25  GLUCOSE 73 128*  BUN 26.3* 30*  CREATININE 0.8 0.89  CALCIUM 9.4 9.1   Liver Function Tests:  Recent Labs Lab 09/05/15 0904 09/07/15 1610  AST 14 20  ALT 10 16  ALKPHOS 61 57  BILITOT 0.96 0.9  PROT 8.6* 8.3*  ALBUMIN 2.2* 2.6*   No results for input(s): LIPASE, AMYLASE in the last 168 hours. No results for input(s): AMMONIA in the last 168 hours. CBC:  Recent Labs Lab 09/05/15 0903  09/07/15 1610  WBC 2.7* 5.5  NEUTROABS 2.1 4.6  HGB 8.5* 8.2*  HCT 27.5* 26.3*  MCV 96.5 96.0  PLT 298 279   Cardiac Enzymes: No results for input(s): CKTOTAL, CKMB, CKMBINDEX, TROPONINI in the last 168 hours.  BNP (last 3 results) No results for input(s): BNP in the last 8760 hours.  ProBNP (last 3 results) No results  for input(s): PROBNP in the last 8760 hours.  CBG: No results for input(s): GLUCAP in the last 168 hours.  Radiological Exams on Admission: Dg Chest 2 View  09/07/2015  CLINICAL DATA:  Dry cough for 2-3 months. EXAM: CHEST  2 VIEW COMPARISON:  08/24/2015 FINDINGS: There are low lung volumes. There is bilateral interstitial thickening. There is no focal consolidation. There is no pleural effusion or pneumothorax. There is stable cardiomegaly. There is thoracic aortic atherosclerosis. There is a right-sided Port-A-Cath in satisfactory position. There is a healing right posterior sixth rib fracture. IMPRESSION: Stable cardiomegaly with mild pulmonary vascular congestion likely superimposed upon chronic interstitial disease. Electronically Signed   By: Kathreen Devoid   On: 09/07/2015 16:00    EKG: Independently reviewed. Sinus tach at 113  Assessment/Plan Principal Problem:   Respiratory failure with hypoxia  - ? HCAP - may not be showing upon CXR due to dehydration- follow with Vanc and Azactam- hydration may cause worsening resp function - on 3 L O2- pulse ox 99% - Flutter valve, Mucinex and IS - very tachycardic therefore nebs PRN only - admit to SDU at least for tonight  Active Problems:  Dehydration - NS bolus given- start IVF    Dementia with behavioral disturbance - cont Seroquel and PRN Ativan- daughter will stay at bedside tonight    Multiple myeloma in relapse - hospice recommended by Dr Alvy Bimler due to FTT    Protein-calorie malnutrition, severe / FTT (failure to thrive) in adult - continues to have a poor appetite - Megace just started yesterday -  Nutrition eval   Consulted: Palliative care  Code Status: full code  Family Communication:  Daughter - Victoria [name]  DVT Prophylaxis: Lovenox  Time spent: 8 minutes  [place], MD [facility]  If 7PM-7AM, please contact night-coverage [email or web address] 09/07/2015, 6:42 PM

## 2015-09-08 ENCOUNTER — Inpatient Hospital Stay (HOSPITAL_COMMUNITY): Payer: Medicare Other

## 2015-09-08 ENCOUNTER — Other Ambulatory Visit: Payer: Self-pay

## 2015-09-08 DIAGNOSIS — J209 Acute bronchitis, unspecified: Secondary | ICD-10-CM | POA: Insufficient documentation

## 2015-09-08 DIAGNOSIS — I472 Ventricular tachycardia: Secondary | ICD-10-CM

## 2015-09-08 DIAGNOSIS — E860 Dehydration: Secondary | ICD-10-CM

## 2015-09-08 LAB — CBC
HEMATOCRIT: 21.9 % — AB (ref 36.0–46.0)
Hemoglobin: 6.8 g/dL — CL (ref 12.0–15.0)
MCH: 29.8 pg (ref 26.0–34.0)
MCHC: 31.1 g/dL (ref 30.0–36.0)
MCV: 96.1 fL (ref 78.0–100.0)
PLATELETS: 217 10*3/uL (ref 150–400)
RBC: 2.28 MIL/uL — AB (ref 3.87–5.11)
RDW: 17.6 % — AB (ref 11.5–15.5)
WBC: 3.4 10*3/uL — AB (ref 4.0–10.5)

## 2015-09-08 LAB — BASIC METABOLIC PANEL
Anion gap: 3 — ABNORMAL LOW (ref 5–15)
BUN: 25 mg/dL — ABNORMAL HIGH (ref 6–20)
CHLORIDE: 121 mmol/L — AB (ref 101–111)
CO2: 23 mmol/L (ref 22–32)
CREATININE: 0.69 mg/dL (ref 0.44–1.00)
Calcium: 8.3 mg/dL — ABNORMAL LOW (ref 8.9–10.3)
GFR calc non Af Amer: >60 mL/min (ref >60)
Glucose, Bld: 87 mg/dL (ref 65–99)
POTASSIUM: 3.1 mmol/L — AB (ref 3.5–5.1)
SODIUM: 147 mmol/L — AB (ref 135–145)

## 2015-09-08 LAB — PREPARE RBC (CROSSMATCH)

## 2015-09-08 LAB — MAGNESIUM: MAGNESIUM: 1.7 mg/dL (ref 1.7–2.4)

## 2015-09-08 LAB — HIV ANTIBODY (ROUTINE TESTING W REFLEX): HIV SCREEN 4TH GENERATION: NONREACTIVE

## 2015-09-08 MED ORDER — AZITHROMYCIN 250 MG PO TABS
250.0000 mg | ORAL_TABLET | Freq: Every day | ORAL | Status: DC
  Administered 2015-09-09 – 2015-09-11 (×3): 250 mg via ORAL
  Filled 2015-09-08 (×3): qty 1

## 2015-09-08 MED ORDER — MEGESTROL ACETATE 40 MG PO TABS
160.0000 mg/d | ORAL_TABLET | Freq: Two times a day (BID) | ORAL | Status: DC
  Administered 2015-09-08 – 2015-09-09 (×3): 80 mg via ORAL
  Filled 2015-09-08 (×5): qty 2

## 2015-09-08 MED ORDER — SODIUM CHLORIDE 0.9 % IV SOLN: Freq: Once | INTRAVENOUS | Status: DC

## 2015-09-08 MED ORDER — QUETIAPINE FUMARATE 25 MG PO TABS
50.0000 mg | ORAL_TABLET | Freq: Every day | ORAL | Status: DC
  Administered 2015-09-08 – 2015-09-10 (×3): 50 mg via ORAL
  Filled 2015-09-08 (×3): qty 2

## 2015-09-08 MED ORDER — DEXTROSE 5 % IV SOLN
INTRAVENOUS | Status: DC
  Administered 2015-09-08 – 2015-09-10 (×4): via INTRAVENOUS

## 2015-09-08 MED ORDER — POTASSIUM CHLORIDE 10 MEQ/100ML IV SOLN
10.0000 meq | INTRAVENOUS | Status: AC
  Administered 2015-09-08 (×4): 10 meq via INTRAVENOUS
  Filled 2015-09-08 (×5): qty 100

## 2015-09-08 MED ORDER — MAGNESIUM SULFATE 2 GM/50ML IV SOLN
2.0000 g | Freq: Once | INTRAVENOUS | Status: AC
  Administered 2015-09-08: 2 g via INTRAVENOUS
  Filled 2015-09-08: qty 50

## 2015-09-08 MED ORDER — AZITHROMYCIN 250 MG PO TABS
500.0000 mg | ORAL_TABLET | Freq: Every day | ORAL | Status: AC
  Administered 2015-09-08: 500 mg via ORAL
  Filled 2015-09-08: qty 2

## 2015-09-08 MED ORDER — POTASSIUM CHLORIDE 20 MEQ/15ML (10%) PO SOLN
40.0000 meq | Freq: Once | ORAL | Status: AC
  Administered 2015-09-08: 40 meq via ORAL
  Filled 2015-09-08: qty 30

## 2015-09-08 MED ORDER — SODIUM CHLORIDE 0.9 % IJ SOLN
10.0000 mL | INTRAMUSCULAR | Status: DC | PRN
  Administered 2015-09-09 – 2015-09-11 (×2): 10 mL
  Filled 2015-09-08 (×2): qty 40

## 2015-09-08 MED ORDER — QUETIAPINE FUMARATE 25 MG PO TABS: 25.0000 mg | ORAL_TABLET | Freq: Every day | ORAL | Status: DC

## 2015-09-08 NOTE — Progress Notes (Signed)
Report called and given to Harden Mo, rn on 3rd floor and pt transferred to 1325. Left unit on bed pushed by this rn and nurse tech accompanied by dtr. Arrived in good condition. Vwilliams, rn.

## 2015-09-08 NOTE — Progress Notes (Signed)
Report called to 4th floor, Ship broker. Pt to go to rm [id number]
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
## 2015-09-08 NOTE — Progress Notes (Addendum)
[facility] Progress Note   Damonica L Helseth  MRN:[id number]  DOB: 11/19/1931  DOA: 09/07/2015 PCP: GORSUCH, NI, MD  Brief narrative: Giuliana L Coldwell is a 81 y.o. female with multiple myeloma in remission, dementia, hypertension who was recently admitted to the hospital on 11/25 for confusion and dehydration. After being adequately hydrated she was discharged to nursing facility. She returns now because of a cough which has not improved on ciprofloxacin. She is again noted to be severely dehydrated and has continued to have poor by mouth intake. She was started on Megace yesterday by her oncologist.   Subjective: Pleasantly confused  Assessment/Plan: Principal Problem:   Respiratory failure with hypoxia -Oxygen level 99% on 3 L in the ER-96% on room air today -"Minimal right lower lobe infiltrate" noted on chest x-ray on 12/8 which was obtained at the nursing facility -Chest x-ray on 12/9 revealed low lung volumes and interstitial disease without any obvious infiltrate -Started on HCAP coverage yesterday for possible pneumonia however, after hydration her repeat chest x-ray is morning is still negative for infiltrate -will treat as acute bronchitis-transition Vancomycin and Azactam to doxycycline - Has been weaned off of oxygen and has a pulse ox of 98% on room air  Active Problems: Normocytic Anemia-  anemia due to multiple myeloma and anemia of chronic disease -With hydration, hemoglobin has dropped to 6.8-we'll transfuse one unit of packed red blood cells today -anemia panel on 4/16 was consistent with anemia of chronic disease -she had a normal folate level and a B-12 of 596 couple of weeks ago  Dehydration -Continue IV fluids-by mouth intake continues to be extremely poor  V. Tach  -She had short runs of V. tach last night-this may be secondary to anemia, hypokalemia and dehydration -Obtain 2-D echo-replace potassium and transfuse blood-check magnesium level and replace if  needed -Has been evaluated by Dr. Hilty in the past-Stress test in the spring of this year did not reveal any reversible defects  Hypernatremia/hyperchloremia -We'll switch normal saline to D5 water  Severe protein calorie malnutrition/failure to thrive -Started on Megace couple of days ago- will continue -Daughter was curious about a feeding tube and after being explained the pros and cons of this, she feels that she is not ready for her mother to have it placed -Thyroid function tests last week were normal    Dementia with behavioral disturbance -She received 25 mg of Seroquel at bedtime and 2 doses of Ativan for a total of 1.5 mg last night    Multiple myeloma in relapse  -Oncology recommending hospice due to anorexia and frailty  Disposition: Due to ongoing failure to thrive with weight loss and poor by mouth intake, palliative care has been consulted however, both daughter and son would like to continue to make attempts to increase her by mouth intake and would like her to continue to be a full code   Code Status:     Code Status Orders        Start     Ordered   09/07/15 1828  Full code   Continuous     09/07/15 1828    Advance Directive Documentation        Most Recent Value   Type of Advance Directive  Healthcare Power of Attorney   Pre-existing out of facility DNR order (yellow form or pink MOST form)     "MOST" Form in Place?       Family Communication: Daughter at bedside Disposition Plan: Continue to follow   on telemetry DVT prophylaxis: Heparin Consultants: Palliative care Procedures: None  Antibiotics: Anti-infectives    Start     Dose/Rate Route Frequency Ordered Stop   09/08/15 2000  vancomycin (VANCOCIN) 500 mg in sodium chloride 0.9 % 100 mL IVPB  Status:  Discontinued     500 mg 100 mL/hr over 60 Minutes Intravenous Every 24 hours 09/07/15 1848 09/07/15 2108   09/08/15 1000  acyclovir (ZOVIRAX) tablet 400 mg     400 mg Oral Daily 09/07/15 1816      09/08/15 0400  aztreonam (AZACTAM) 1 g in dextrose 5 % 50 mL IVPB     1 g 100 mL/hr over 30 Minutes Intravenous Every 8 hours 09/07/15 1849     09/07/15 2200  aztreonam (AZACTAM) 2 g in dextrose 5 % 50 mL IVPB  Status:  Discontinued     2 g 100 mL/hr over 30 Minutes Intravenous 3 times per day 09/07/15 1828 09/07/15 1849   09/07/15 2200  vancomycin (VANCOCIN) 500 mg in sodium chloride 0.9 % 100 mL IVPB     500 mg 100 mL/hr over 60 Minutes Intravenous Every 24 hours 09/07/15 2108     09/07/15 1900  vancomycin (VANCOCIN) 500 mg in sodium chloride 0.9 % 100 mL IVPB  Status:  Discontinued     500 mg 100 mL/hr over 60 Minutes Intravenous  Once 09/07/15 1848 09/07/15 2225   09/07/15 1900  aztreonam (AZACTAM) 1 g in dextrose 5 % 50 mL IVPB     1 g 100 mL/hr over 30 Minutes Intravenous  Once 09/07/15 1849 09/07/15 2112      Objective: Filed Weights   09/07/15 2005 09/08/15 0400  Weight: 37.7 kg (83 lb 1.8 oz) 37.7 kg (83 lb 1.8 oz)    Intake/Output Summary (Last 24 hours) at 09/08/15 1129 Last data filed at 09/08/15 0700  Gross per 24 hour  Intake   1150 ml  Output      0 ml  Net   1150 ml     Vitals Filed Vitals:   09/08/15 0600 09/08/15 0700 09/08/15 0800 09/08/15 0939  BP: 98/43 123/54 129/63 117/65  Pulse: 67 63 76 63  Temp:    97.7 F (36.5 C)  TempSrc:    Oral  Resp: 19 15 22 22  Height:      Weight:      SpO2: 96%   96%    Exam:  General:  Pt is alert, confused, not in acute distress  HEENT: No icterus, No thrush, oral mucosa moist  Cardiovascular: regular rate and rhythm, S1/S2 No murmur  Respiratory: clear to auscultation bilaterally   Abdomen: Soft, +Bowel sounds, non tender, non distended, no guarding  MSK: No LE edema, cyanosis or clubbing  Data Reviewed: Basic Metabolic Panel:  Recent Labs Lab 09/05/15 0904 09/07/15 1610 09/08/15 0818  NA 144 146* 147*  K 3.6 3.5 3.1*  CL  --  113* 121*  CO2 22 25 23  GLUCOSE 73 128* 87  BUN 26.3* 30* 25*   CREATININE 0.8 0.89 0.69  CALCIUM 9.4 9.1 8.3*   Liver Function Tests:  Recent Labs Lab 09/05/15 0904 09/07/15 1610  AST 14 20  ALT 10 16  ALKPHOS 61 57  BILITOT 0.96 0.9  PROT 8.6* 8.3*  ALBUMIN 2.2* 2.6*   No results for input(s): LIPASE, AMYLASE in the last 168 hours. No results for input(s): AMMONIA in the last 168 hours. CBC:  Recent Labs Lab 09/05/15 0903 09/07/15 1610 09/08/15   0818  WBC 2.7* 5.5 3.4*  NEUTROABS 2.1 4.6  --   HGB 8.5* 8.2* 6.8*  HCT 27.5* 26.3* 21.9*  MCV 96.5 96.0 96.1  PLT 298 279 217   Cardiac Enzymes: No results for input(s): CKTOTAL, CKMB, CKMBINDEX, TROPONINI in the last 168 hours. BNP (last 3 results) No results for input(s): BNP in the last 8760 hours.  ProBNP (last 3 results) No results for input(s): PROBNP in the last 8760 hours.  CBG: No results for input(s): GLUCAP in the last 168 hours.  Recent Results (from the past 240 hour(s))  MRSA PCR Screening     Status: None   Collection Time: 09/07/15  8:17 PM  Result Value Ref Range Status   MRSA by PCR NEGATIVE NEGATIVE Final    Comment:        The GeneXpert MRSA Assay (FDA approved for NASAL specimens only), is one component of a comprehensive MRSA colonization surveillance program. It is not intended to diagnose MRSA infection nor to guide or monitor treatment for MRSA infections.      Studies: Dg Chest 2 View  09/07/2015  CLINICAL DATA:  Dry cough for 2-3 months. EXAM: CHEST  2 VIEW COMPARISON:  08/24/2015 FINDINGS: There are low lung volumes. There is bilateral interstitial thickening. There is no focal consolidation. There is no pleural effusion or pneumothorax. There is stable cardiomegaly. There is thoracic aortic atherosclerosis. There is a right-sided Port-A-Cath in satisfactory position. There is a healing right posterior sixth rib fracture. IMPRESSION: Stable cardiomegaly with mild pulmonary vascular congestion likely superimposed upon chronic interstitial  disease. Electronically Signed   By: Hetal  Patel   On: 09/07/2015 16:00   Dg Chest Port 1 View  09/08/2015  CLINICAL DATA:  Short of breath and cough for 5 days EXAM: PORTABLE CHEST 1 VIEW COMPARISON:  Radiograph 09/06/2005 FINDINGS: Left-sided port overlies stable cardiac silhouette. Low lung volumes. There is chronic interstitial thickening. No focal consolidation. Remote RIGHT rib fracture. IMPRESSION: Low lung volumes and chronic interstitial lung disease. Electronically Signed   By: Stewart  Edmunds M.D.   On: 09/08/2015 10:49    Scheduled Meds:  Scheduled Meds: . sodium chloride   Intravenous Once  . acyclovir  400 mg Oral Daily  . aztreonam  1 g Intravenous Q8H  . guaiFENesin  600 mg Oral BID  . heparin subcutaneous  5,000 Units Subcutaneous 3 times per day  . megestrol  160 mg/day Oral BID  . potassium chloride  10 mEq Intravenous Q1 Hr x 4  . QUEtiapine  25 mg Oral QHS  . vancomycin  500 mg Intravenous Q24H   Continuous Infusions: . dextrose 100 mL/hr at 09/08/15 0904    Time spent on care of this patient: 40 min   ,, MD 09/08/2015, 11:29 AM  LOS: 1 day   [facility] Office  [phone] Pager - Text Page per [email or web address] If 7PM-7AM, please contact night-coverage [email or web address]       

## 2015-09-08 NOTE — Progress Notes (Signed)
Pt transferred from Step down to room 1325, pt assessed and oriented to room.  Dr. Wynelle Cleveland in hallway speaking to palliative NP and daughter.  With pt's cardiac history, pt is transferring to telemetry.  Report attempted, 4th floor stated RN would call back in 5-10 minutes

## 2015-09-08 NOTE — Progress Notes (Signed)
CRITICAL VALUE ALERT  Critical value received:  Hbg 6.8  Date of notification: 09/08/2015  Time of notification:  0858  Critical value read back:Yes.    Nurse who received alert:  Mija Effertz, Jaynie Bream   MD notified (1st page):  Dr. Wynelle Cleveland  Time of first page:  [phone], spoke with in hall  MD notified (2nd page):  Time of second page:  Responding MD:  Dr. Wynelle Cleveland  Time MD responded:  0858--MD speaking with family, will order blood transfusion

## 2015-09-08 NOTE — Progress Notes (Signed)
Kennis Carina notified of pt having a 10bt run of V-tach and HR increasing to the 200s,but not sustaining. EKG shows ST. VSS. Pt resting at this time. No further orders received at this time.

## 2015-09-08 NOTE — Progress Notes (Signed)
Received report from [place], Therapist, sports. Agree with previous assessment. Will continue to monitor patient.

## 2015-09-08 NOTE — Progress Notes (Signed)
Daily Progress Note   Patient Name: Victoria [name]       Date: 09/08/2015 DOB: [birth year]  Age: 81 y.o. MRN#: [id number] Attending Physician: Debbe Odea, MD Primary Care Physician: Heath Lark, MD Admit Date: 09/07/2015  Reason for Consultation/Follow-up: Establishing goals of care  Subjective: Pt admitted to step down last night. Had runs of non-sustained V tach. Per daughter who was at the bedside then and now, that pt was asymptomatic. Pt her self is confused. No agitation but did receive ativan x 1 for anxiety which apparently is a long standing problem. Dr. Wynelle Cleveland as well as myself met with daughter. Also pt's son per daughter has come into town but is not at bedside presently. She continues to verbalize wanting full scope of treatment including chest compression, defibrillation and is even considering PEG . She states her brother is of the same mind set of full scope of treatment. She tells me "years ago" her mother was intubated and then " was fine". Although she will acknowledge that her mother's clinical condition as declined she feels her mother would want " everything" to be done. Interval Events: Runs of VTACH. Moving to telemetry floor Length of Stay: 1 day  Current Medications: Scheduled Meds:  . sodium chloride   Intravenous Once  . acyclovir  400 mg Oral Daily  . aztreonam  1 g Intravenous Q8H  . guaiFENesin  600 mg Oral BID  . heparin subcutaneous  5,000 Units Subcutaneous 3 times per day  . megestrol  160 mg/day Oral BID  . potassium chloride  10 mEq Intravenous Q1 Hr x 4  . potassium chloride  40 mEq Oral Once  . QUEtiapine  25 mg Oral QHS  . vancomycin  500 mg Intravenous Q24H    Continuous Infusions: . dextrose 100 mL/hr at 09/08/15 0904    PRN  Meds: ipratropium-albuterol, LORazepam, LORazepam  Physical Exam: Physical Exam  Constitutional:  Cachetic frail elderly female. Alert  Cardiovascular:  Runs of non-sustained Vtach.   Pulmonary/Chest:  Increased work of breathing; moist cough  Musculoskeletal: Normal range of motion.  Neurological: She is alert.  Oriented to self and that she is in the hospital. No agitation  Skin: Skin is warm and dry.  Vital Signs: BP 129/63 mmHg  Pulse 76  Temp(Src) 98.9 F (37.2 C) (Axillary)  Resp 22  Ht 5' 2.5" (1.588 m)  Wt 37.7 kg (83 lb 1.8 oz)  BMI 14.95 kg/m2  SpO2 96% SpO2: SpO2: 96 % O2 Device: O2 Device: Not Delivered O2 Flow Rate: O2 Flow Rate (L/min): 2 L/min  Intake/output summary:  Intake/Output Summary (Last 24 hours) at 09/08/15 4268 Last data filed at 09/08/15 0700  Gross per 24 hour  Intake   1150 ml  Output      0 ml  Net   1150 ml   LBM: Last BM Date: 09/07/15 Baseline Weight: Weight: 37.7 kg (83 lb 1.8 oz) Most recent weight: Weight: 37.7 kg (83 lb 1.8 oz)       Palliative Assessment/Data: Flowsheet Rows        Most Recent Value   Intake Tab    Referral Department  Hospitalist   Unit at Time of Referral  Intermediate Care Unit   Palliative Care Primary Diagnosis  Pulmonary   Date Notified  09/07/15   Palliative Care Type  New Palliative care   Date of Admission  09/07/15   Date first seen by Palliative Care  09/07/15   # of days Palliative referral response time  0 Day(s)   # of days IP prior to Palliative referral  0   Clinical Assessment    Palliative Performance Scale Score  30%   Pain Max last 24 hours  Other (Comment)   Pain Min Last 24 hours  Other (Comment)   Dyspnea Max Last 24 Hours  Other (Comment)   Dyspnea Min Last 24 hours  Other (Comment)   Nausea Max Last 24 Hours  Other (Comment)   Nausea Min Last 24 Hours  Other (Comment)   Anxiety Max Last 24 Hours  Other (Comment)   Anxiety Min Last 24 Hours  Other (Comment)    Other Max Last 24 Hours  Other (Comment)   Psychosocial & Spiritual Assessment    Palliative Care Outcomes    Patient/Family meeting held?  Yes   Palliative Care follow-up planned  Yes, Facility      Additional Data Reviewed: CBC    Component Value Date/Time   WBC 3.4* 09/08/2015 0818   WBC 2.7* 09/05/2015 0903   RBC 2.28* 09/08/2015 0818   RBC 2.85* 09/05/2015 0903   RBC 2.74* 12/30/2014 0721   HGB 6.8* 09/08/2015 0818   HGB 8.5* 09/05/2015 0903   HCT 21.9* 09/08/2015 0818   HCT 27.5* 09/05/2015 0903   PLT 217 09/08/2015 0818   PLT 298 09/05/2015 0903   MCV 96.1 09/08/2015 0818   MCV 96.5 09/05/2015 0903   MCH 29.8 09/08/2015 0818   MCH 29.8 09/05/2015 0903   MCHC 31.1 09/08/2015 0818   MCHC 30.9* 09/05/2015 0903   RDW 17.6* 09/08/2015 0818   RDW 17.9* 09/05/2015 0903   LYMPHSABS 0.7 09/07/2015 1610   LYMPHSABS 0.4* 09/05/2015 0903   MONOABS 0.3 09/07/2015 1610   MONOABS 0.2 09/05/2015 0903   EOSABS 0.0 09/07/2015 1610   EOSABS 0.0 09/05/2015 0903   BASOSABS 0.0 09/07/2015 1610   BASOSABS 0.0 09/05/2015 0903    CMP     Component Value Date/Time   NA 147* 09/08/2015 0818   NA 144 09/05/2015 0904   K 3.1* 09/08/2015 0818   K 3.6 09/05/2015 0904   CL 121* 09/08/2015 0818   CL 103 03/11/2013 1503   CO2 23 09/08/2015 0818   CO2  22 09/05/2015 0904   GLUCOSE 87 09/08/2015 0818   GLUCOSE 73 09/05/2015 0904   GLUCOSE 91 03/11/2013 1503   BUN 25* 09/08/2015 0818   BUN 26.3* 09/05/2015 0904   CREATININE 0.69 09/08/2015 0818   CREATININE 0.8 09/05/2015 0904   CALCIUM 8.3* 09/08/2015 0818   CALCIUM 9.4 09/05/2015 0904   CALCIUM 11.4* 12/30/2014 0835   PROT 8.3* 09/07/2015 1610   PROT 8.6* 09/05/2015 0904   ALBUMIN 2.6* 09/07/2015 1610   ALBUMIN 2.2* 09/05/2015 0904   AST 20 09/07/2015 1610   AST 14 09/05/2015 0904   ALT 16 09/07/2015 1610   ALT 10 09/05/2015 0904   ALKPHOS 57 09/07/2015 1610   ALKPHOS 61 09/05/2015 0904   BILITOT 0.9 09/07/2015 1610    BILITOT 0.96 09/05/2015 0904   GFRNONAA >60 09/08/2015 0818   GFRAA >60 09/08/2015 0818       Problem List:  Patient Active Problem List   Diagnosis Date Noted  . HAP (hospital-acquired pneumonia) 09/07/2015  . HCAP (healthcare-associated pneumonia) 09/07/2015  . Respiratory failure with hypoxia ([place]) 09/07/2015  . Herpes infection 09/01/2015  . FTT (failure to thrive) in adult 09/01/2015  . Metabolic encephalopathy 68/11/2120  . Dysuria 08/08/2015  . Full code status 07/02/2015  . Poor venous access 07/02/2015  . Palliative care encounter 06/07/2015  . Weakness generalized 06/07/2015  . DNR (do not resuscitate) discussion 06/07/2015  . Protein-calorie malnutrition, severe ([place]) 05/18/2015  . Hypoglycemia 05/16/2015  . Acute confusional state 05/14/2015  . Bilateral lower extremity edema 04/27/2015  . Conjunctivitis of left eye 03/30/2015  . Coronary artery calcification 03/28/2015  . PVC's (premature ventricular contractions) 03/28/2015  . Weakness 03/28/2015  . Mild dementia 03/07/2015  . Protein calorie malnutrition ([place]) 03/02/2015  . Dehydration 02/16/2015  . Diarrhea due to drug 02/16/2015  . Leukopenia due to antineoplastic chemotherapy 02/01/2015  . Pancytopenia ([place]) 01/24/2015  . Hypokalemia 12/30/2014  . Multiple myeloma in relapse ([place]) 12/30/2014  . Dementia with behavioral disturbance 10/02/2014  . Hypercalcemia 07/11/2014  . ATAXIA 12/14/2009  . HEARING DEFICIT 05/31/2009  . Anxiety state 03/21/2008  . Anemia 03/08/2008  . INSOMNIA, PERSISTENT 03/08/2008  . 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 Essential hypertension 11/25/2007     Palliative Care Assessment & Plan    1.Code Status:  Full code    Code Status Orders        Start     Ordered   09/07/15 1828  Full code   Continuous     09/07/15 1828    Advance Directive Documentation        Most Recent Value   Type of Advance Directive  Healthcare  Power of Attorney   Pre-existing out of facility DNR order (yellow form or pink MOST form)     "MOST" Form in Place?         2. Goals of Care/Additional Recommendations:  Full scope of treatment  Stressed again DNR/DNI does NOT mean do not treat but  would set limits of care that maybe more harmful than beneficial. Daughter continues to maintain Full Code, ICU, intubation, CPR, defibrillation and possibly PEG  I did offer to meet with she and her brother to discuss advance care planning in setting of worsening clinical status. She agreed to call me if she and her brother see the need but at this point is deferring  Limitations on Scope of Treatment: Full Scope Treatment  Desire for further Chaplaincy support:no 3. Symptom  Management:      1.Anxiety: Cont ativan prn      2. Dyspnea: pt appears SHOB at rest but will defer management with opioids in setting of full scope of          treatment goals and pt's fragile state of healht  4. Palliative Prophylaxis:   Aspiration, Bowel Regimen, Delirium Protocol, Frequent Pain Assessment and Turn Reposition  5. Prognosis: Pt could have a prognosis of 6 months or less 2/2 multiple myeloma in setting of     protein calorie malnutrition, dementia. Daughter is not interested in this level of service 6. Discharge Planning:  [place] for rehab with Palliative care service follow-up. Daughter wants to pursue skilled days for strenghtening. She does NOT want to return to [place] was discussed with Dr. Wynelle Cleveland  Thank you for allowing the Palliative Medicine Team to assist in the care of this patient.   Time In: 0900 Time Out: 935 Total Time 35 min Prolonged Time Billed  no         Dory Horn, NP  09/08/2015, 9:23 AM  Please contact Palliative Medicine Team phone at [phone] for questions and concerns.

## 2015-09-09 ENCOUNTER — Inpatient Hospital Stay (HOSPITAL_COMMUNITY): Payer: Medicare Other

## 2015-09-09 DIAGNOSIS — J9621 Acute and chronic respiratory failure with hypoxia: Secondary | ICD-10-CM

## 2015-09-09 LAB — CBC
HEMATOCRIT: 27.3 % — AB (ref 36.0–46.0)
HEMOGLOBIN: 8.5 g/dL — AB (ref 12.0–15.0)
MCH: 29.1 pg (ref 26.0–34.0)
MCHC: 31.1 g/dL (ref 30.0–36.0)
MCV: 93.5 fL (ref 78.0–100.0)
Platelets: 210 10*3/uL (ref 150–400)
RBC: 2.92 MIL/uL — AB (ref 3.87–5.11)
RDW: 17.9 % — ABNORMAL HIGH (ref 11.5–15.5)
WBC: 4.2 10*3/uL (ref 4.0–10.5)

## 2015-09-09 LAB — BASIC METABOLIC PANEL
ANION GAP: 6 (ref 5–15)
BUN: 20 mg/dL (ref 6–20)
CO2: 22 mmol/L (ref 22–32)
Calcium: 8.4 mg/dL — ABNORMAL LOW (ref 8.9–10.3)
Chloride: 114 mmol/L — ABNORMAL HIGH (ref 101–111)
Creatinine, Ser: 0.68 mg/dL (ref 0.44–1.00)
GFR calc Af Amer: >60 mL/min (ref >60)
GLUCOSE: 90 mg/dL (ref 65–99)
POTASSIUM: 3.3 mmol/L — AB (ref 3.5–5.1)
Sodium: 142 mmol/L (ref 135–145)

## 2015-09-09 LAB — MAGNESIUM: MAGNESIUM: 2.1 mg/dL (ref 1.7–2.4)

## 2015-09-09 LAB — BRAIN NATRIURETIC PEPTIDE: B NATRIURETIC PEPTIDE 5: 345.6 pg/mL — AB (ref 0.0–100.0)

## 2015-09-09 MED ORDER — POTASSIUM CHLORIDE CRYS ER 20 MEQ PO TBCR
40.0000 meq | EXTENDED_RELEASE_TABLET | ORAL | Status: AC
  Administered 2015-09-09 (×2): 40 meq via ORAL
  Filled 2015-09-09 (×2): qty 2

## 2015-09-09 MED ORDER — POTASSIUM CHLORIDE 20 MEQ/15ML (10%) PO SOLN
40.0000 meq | ORAL | Status: DC
  Filled 2015-09-09: qty 30

## 2015-09-09 MED ORDER — ENSURE ENLIVE PO LIQD
237.0000 mL | Freq: Two times a day (BID) | ORAL | Status: DC
  Administered 2015-09-09 – 2015-09-11 (×4): 237 mL via ORAL

## 2015-09-09 MED ORDER — PANTOPRAZOLE SODIUM 40 MG PO TBEC
40.0000 mg | DELAYED_RELEASE_TABLET | Freq: Every day | ORAL | Status: DC
  Administered 2015-09-09 – 2015-09-11 (×3): 40 mg via ORAL
  Filled 2015-09-09 (×3): qty 1

## 2015-09-09 MED ORDER — ACETAMINOPHEN 325 MG PO TABS
650.0000 mg | ORAL_TABLET | ORAL | Status: DC | PRN
  Administered 2015-09-09 – 2015-09-10 (×2): 650 mg via ORAL
  Filled 2015-09-09 (×2): qty 2

## 2015-09-09 MED ORDER — MEGESTROL ACETATE 40 MG PO TABS
320.0000 mg/d | ORAL_TABLET | Freq: Two times a day (BID) | ORAL | Status: DC
  Administered 2015-09-09 – 2015-09-11 (×4): 160 mg via ORAL
  Filled 2015-09-09 (×6): qty 4

## 2015-09-09 NOTE — Clinical Social Work Placement (Signed)
   CLINICAL SOCIAL WORK PLACEMENT  NOTE  Date:  09/09/2015  Patient Details  Name: Victoria [name] MRN: [id number] Date of Birth: Apr 24, 1932  Clinical Social Work is seeking post-discharge placement for this patient at the [place] level of care (*CSW will initial, date and re-position this form in  chart as items are completed):  Yes   Patient/family provided with [place] Work Department's list of facilities offering this level of care within the geographic area requested by the patient (or if unable, by the patient's family).  Yes   Patient/family informed of their freedom to choose among providers that offer the needed level of care, that participate in Medicare, Medicaid or managed care program needed by the patient, have an available bed and are willing to accept the patient.  Yes   Patient/family informed of [facility]'s ownership interest in [facility] and [facility], as well as of the fact that they are under no obligation to receive care at these facilities.  PASRR submitted to EDS on       PASRR number received on       Existing PASRR number confirmed on 09/09/15     FL2 transmitted to all facilities in geographic area requested by pt/family on 09/09/15     FL2 transmitted to all facilities within larger geographic area on       Patient informed that his/her managed care company has contracts with or will negotiate with certain facilities, including the following:            Patient/family informed of bed offers received.  Patient chooses bed at       Physician recommends and patient chooses bed at      Patient to be transferred to   on  .  Patient to be transferred to facility by       Patient family notified on   of transfer.  Name of family member notified:        PHYSICIAN       Additional Comment:    _______________________________________________ Rigoberto Noel, LCSW 09/09/2015, 1:57 PM

## 2015-09-09 NOTE — Progress Notes (Signed)
Brief Pharmacy note: Pharmacy may adjust abx  12/9 >> vanc >> 12/10 12/9 >>  aztreonam >>  12/10 12/10 >> Azithromycin po >> 12/10 >> PTA Acyclovir >>  Currently on Azithromycin po which does not need renal dose adjustment, PTA Acyclovir dosing appropriate  Pharmacy will sign off protocol.  Thank you,   Minda Ditto PharmD Pager [phone] 09/09/2015, 9:26 AM

## 2015-09-09 NOTE — Plan of Care (Signed)
Problem: Nutrition: Goal: Adequate nutrition will be maintained Outcome: Not Progressing Pt refuse to eat

## 2015-09-09 NOTE — Progress Notes (Signed)
[facility] Progress Note   Victoria [name]  MGN:[id number]  DOB: 1932/08/02  DOA: 09/07/2015 PCP: Alvy Bimler NI, MD  Brief narrative: Victoria [name] is a 81 y.o. female with multiple myeloma in remission, dementia, hypertension who was recently admitted to the hospital on 11/25 for confusion and dehydration. After being adequately hydrated she was discharged to nursing facility. She returns now because of a cough which has not improved on ciprofloxacin. She is again noted to be severely dehydrated and has continued to have poor by mouth intake. She was started on Megace yesterday by her oncologist.   Subjective: Pleasantly confused- refusing to eat again today.   Assessment/Plan: Principal Problem:   Respiratory failure with hypoxia -Oxygen level 99% on 3 L in the ER-96% on room air today -"Minimal right lower lobe infiltrate" noted on chest x-ray on 12/8 which was obtained at the nursing facility -Chest x-ray on 12/9 revealed low lung volumes and interstitial disease without any obvious infiltrate -Started on HCAP coverage on admission for possible pneumonia however, after hydration her repeat chest x-ray is morning is still negative for infiltrate -will treat as acute bronchitis-transitioned Vancomycin and Azactam to Doxycycline - Has been weaned off of oxygen and has a pulse ox of 98% on room air  Active Problems: Normocytic Anemia-  anemia due to multiple myeloma and anemia of chronic disease -With hydration, hemoglobin dropped to 6.8- transfused one unit of packed red blood cells  - Hb up to 8.5 -anemia panel on 4/16 was consistent with anemia of chronic disease -she had a normal folate level and a B-12 of 596 couple of weeks ago  Dehydration -Continue IV fluids-by mouth intake continues to be extremely poor  [name]  -She had short runs of V. tach on 12/9/ and 12/10--this may have been secondary to anemia, hypokalemia and dehydration -Obtained 2-D echo- has mod  MR -replace potassium and transfuse blood-checked magnesium level (1.7) and replaced- now > 2 -Has been evaluated by Dr. Debara Pickett in the past-Stress test in the spring of this year did not reveal any reversible defects - no further V-tach   Hypernatremia/hyperchloremia - switched normal saline to D5 water- follow sodium  Hypokalemia - replacing- Mg normal  Severe protein calorie malnutrition/failure to thrive  -Thyroid function tests last week were normal -Started on Megace couple of days ago- will continue- increasing the dose -Daughter was curious about a feeding tube - explained the pros and cons of this and explained that she relapsing Multiple Myeloma which and her oncologist is not recommending further chemo and a PEG is not a good option for her    Dementia with behavioral disturbance -Increased Seroquel to 50 mg with improvement in sleep last night per RN    Multiple myeloma in relapse  -Oncology recommending hospice due to anorexia and frailty- they are not wanting DNR  Disposition: Due to ongoing failure to thrive with weight loss and poor by mouth intake, palliative care has been consulted however, both daughter and son would like to continue to make attempts to increase her by mouth intake and would like her to continue to be a full code   Code Status:     Code Status Orders        Start     Ordered   09/07/15 1828  Full code   Continuous     09/07/15 1828    Advance Directive Documentation        Most Recent Value   Type of Advance  Directive  Healthcare Power of Attorney   Pre-existing out of facility DNR order (yellow form or pink MOST form)     "MOST" Form in Place?       Family Communication: Daughter at bedside Disposition Plan: Continue to follow on telemetry DVT prophylaxis: Heparin Consultants: Palliative care Procedures: None  Antibiotics: Anti-infectives    Start     Dose/Rate Route Frequency Ordered Stop   09/09/15 1000  azithromycin (ZITHROMAX)  tablet 250 mg     250 mg Oral Daily 09/08/15 1144 09/13/15 0959   09/08/15 2000  vancomycin (VANCOCIN) 500 mg in sodium chloride 0.9 % 100 mL IVPB  Status:  Discontinued     500 mg 100 mL/hr over 60 Minutes Intravenous Every 24 hours 09/07/15 1848 09/07/15 2108   09/08/15 1300  azithromycin (ZITHROMAX) tablet 500 mg     500 mg Oral Daily 09/08/15 1144 09/08/15 1420   09/08/15 1000  acyclovir (ZOVIRAX) tablet 400 mg     400 mg Oral Daily 09/07/15 1816     09/08/15 0400  aztreonam (AZACTAM) 1 g in dextrose 5 % 50 mL IVPB  Status:  Discontinued     1 g 100 mL/hr over 30 Minutes Intravenous Every 8 hours 09/07/15 1849 09/08/15 1144   09/07/15 2200  aztreonam (AZACTAM) 2 g in dextrose 5 % 50 mL IVPB  Status:  Discontinued     2 g 100 mL/hr over 30 Minutes Intravenous 3 times per day 09/07/15 1828 09/07/15 1849   09/07/15 2200  vancomycin (VANCOCIN) 500 mg in sodium chloride 0.9 % 100 mL IVPB  Status:  Discontinued     500 mg 100 mL/hr over 60 Minutes Intravenous Every 24 hours 09/07/15 2108 09/08/15 1144   09/07/15 1900  vancomycin (VANCOCIN) 500 mg in sodium chloride 0.9 % 100 mL IVPB  Status:  Discontinued     500 mg 100 mL/hr over 60 Minutes Intravenous  Once 09/07/15 1848 09/07/15 2225   09/07/15 1900  aztreonam (AZACTAM) 1 g in dextrose 5 % 50 mL IVPB     1 g 100 mL/hr over 30 Minutes Intravenous  Once 09/07/15 1849 09/07/15 2112      Objective: Filed Weights   09/07/15 2005 09/08/15 0400  Weight: 37.7 kg (83 lb 1.8 oz) 37.7 kg (83 lb 1.8 oz)    Intake/Output Summary (Last 24 hours) at 09/09/15 1101 Last data filed at 09/09/15 0915  Gross per 24 hour  Intake 1895.33 ml  Output      0 ml  Net 1895.33 ml     Vitals Filed Vitals:   09/08/15 2120 09/08/15 2135 09/09/15 0034 09/09/15 0546  BP: 118/53 114/56 112/52 147/73  Pulse: 70 80 66 66  Temp: 98.3 F (36.8 C) 98.5 F (36.9 C) 97.7 F (36.5 C) 97.8 F (36.6 C)  TempSrc: Axillary Axillary Axillary Axillary  Resp: $Remo'20  20 20 20  'ATUTG$ Height:      Weight:      SpO2: 97% 98% 98% 98%    Exam:  General:  Pt is alert, confused, not in acute distress  HEENT: No icterus, No thrush, oral mucosa moist  Cardiovascular: regular rate and rhythm, S1/S2 No murmur  Respiratory: clear to auscultation bilaterally   Abdomen: Soft, +Bowel sounds, non tender, non distended, no guarding  MSK: No LE edema, cyanosis or clubbing  Data Reviewed: Basic Metabolic Panel:  Recent Labs Lab 09/05/15 0904 09/07/15 1610 09/08/15 0818 09/08/15 1115 09/09/15 0500  NA 144 146* 147*  --  142  K 3.6 3.5 3.1*  --  3.3*  CL  --  113* 121*  --  114*  CO2 $Re'22 25 23  'JfA$ --  22  GLUCOSE 73 128* 87  --  90  BUN 26.3* 30* 25*  --  20  CREATININE 0.8 0.89 0.69  --  0.68  CALCIUM 9.4 9.1 8.3*  --  8.4*  MG  --   --   --  1.7 2.1   Liver Function Tests:  Recent Labs Lab 09/05/15 0904 09/07/15 1610  AST 14 20  ALT 10 16  ALKPHOS 61 57  BILITOT 0.96 0.9  PROT 8.6* 8.3*  ALBUMIN 2.2* 2.6*   No results for input(s): LIPASE, AMYLASE in the last 168 hours. No results for input(s): AMMONIA in the last 168 hours. CBC:  Recent Labs Lab 09/05/15 0903 09/07/15 1610 09/08/15 0818 09/09/15 0500  WBC 2.7* 5.5 3.4* 4.2  NEUTROABS 2.1 4.6  --   --   HGB 8.5* 8.2* 6.8* 8.5*  HCT 27.5* 26.3* 21.9* 27.3*  MCV 96.5 96.0 96.1 93.5  PLT 298 279 217 210   Cardiac Enzymes: No results for input(s): CKTOTAL, CKMB, CKMBINDEX, TROPONINI in the last 168 hours. BNP (last 3 results) No results for input(s): BNP in the last 8760 hours.  ProBNP (last 3 results) No results for input(s): PROBNP in the last 8760 hours.  CBG: No results for input(s): GLUCAP in the last 168 hours.  Recent Results (from the past 240 hour(s))  MRSA PCR Screening     Status: None   Collection Time: 09/07/15  8:17 PM  Result Value Ref Range Status   MRSA by PCR NEGATIVE NEGATIVE Final    Comment:        The GeneXpert MRSA Assay (FDA approved for NASAL  specimens only), is one component of a comprehensive MRSA colonization surveillance program. It is not intended to diagnose MRSA infection nor to guide or monitor treatment for MRSA infections.      Studies: Dg Chest 2 View  09/07/2015  CLINICAL DATA:  Dry cough for 2-3 months. EXAM: CHEST  2 VIEW COMPARISON:  08/24/2015 FINDINGS: There are low lung volumes. There is bilateral interstitial thickening. There is no focal consolidation. There is no pleural effusion or pneumothorax. There is stable cardiomegaly. There is thoracic aortic atherosclerosis. There is a right-sided Port-A-Cath in satisfactory position. There is a healing right posterior sixth rib fracture. IMPRESSION: Stable cardiomegaly with mild pulmonary vascular congestion likely superimposed upon chronic interstitial disease. Electronically Signed   By: Kathreen Devoid   On: 09/07/2015 16:00   Dg Chest Port 1 View  09/08/2015  CLINICAL DATA:  Short of breath and cough for 5 days EXAM: PORTABLE CHEST 1 VIEW COMPARISON:  Radiograph 09/06/2005 FINDINGS: Left-sided port overlies stable cardiac silhouette. Low lung volumes. There is chronic interstitial thickening. No focal consolidation. Remote RIGHT rib fracture. IMPRESSION: Low lung volumes and chronic interstitial lung disease. Electronically Signed   By: Suzy Bouchard M.D.   On: 09/08/2015 10:49    Scheduled Meds:  Scheduled Meds: . sodium chloride   Intravenous Once  . acyclovir  400 mg Oral Daily  . azithromycin  250 mg Oral Daily  . guaiFENesin  600 mg Oral BID  . heparin subcutaneous  5,000 Units Subcutaneous 3 times per day  . megestrol  320 mg/day Oral BID  . pantoprazole  40 mg Oral Daily  . potassium chloride  40 mEq Oral Q4H  . QUEtiapine  50  mg Oral QHS   Continuous Infusions: . dextrose 100 mL/hr at 09/09/15 0046    Time spent on care of this patient: 40 min   [place], MD 09/09/2015, 11:01 AM  LOS: 2 days   [facility] Office   [phone] Pager - Text Page per [email or web address] If 7PM-7AM, please contact night-coverage [email or web address]

## 2015-09-09 NOTE — NC FL2 (Signed)
[place] LEVEL OF CARE SCREENING TOOL     IDENTIFICATION  Patient Name: Victoria [name] Birthdate: [birth year] Sex: female Admission Date (Current Location): 09/07/2015  [facility] and [place] Number: Herbalist and Address:  [facility],  [place] [address], [place]      Provider Number: [id number]  Attending Physician Name and Address:  Debbe Odea, MD  Relative Name and Phone Number:       Current Level of Care: Hospital Recommended Level of Care: [place] Prior Approval Number:    Date Approved/Denied:   PASRR Number: [id number]  Discharge Plan: SNF    Current Diagnoses: Patient Active Problem List   Diagnosis Date Noted  . Acute bronchitis   . HAP (hospital-acquired pneumonia) 09/07/2015  . HCAP (healthcare-associated pneumonia) 09/07/2015  . Respiratory failure with hypoxia ([place]) 09/07/2015  . Herpes infection 09/01/2015  . FTT (failure to thrive) in adult 09/01/2015  . Metabolic encephalopathy 30/05/2329  . Dysuria 08/08/2015  . Full code status 07/02/2015  . Poor venous access 07/02/2015  . Palliative care encounter 06/07/2015  . Weakness generalized 06/07/2015  . DNR (do not resuscitate) discussion 06/07/2015  . Protein-calorie malnutrition, severe ([place]) 05/18/2015  . Hypoglycemia 05/16/2015  . Acute confusional state 05/14/2015  . Bilateral lower extremity edema 04/27/2015  . Conjunctivitis of left eye 03/30/2015  . Coronary artery calcification 03/28/2015  . PVC's (premature ventricular contractions) 03/28/2015  . Weakness 03/28/2015  . Mild dementia 03/07/2015  . Protein calorie malnutrition ([place]) 03/02/2015  . Dehydration 02/16/2015  . Diarrhea due to drug 02/16/2015  . Leukopenia due to antineoplastic chemotherapy 02/01/2015  . Pancytopenia ([place]) 01/24/2015  . Hypokalemia 12/30/2014  . Multiple myeloma in relapse ([place]) 12/30/2014  . Dementia with behavioral disturbance 10/02/2014  .  Hypercalcemia 07/11/2014  . ATAXIA 12/14/2009  . HEARING DEFICIT 05/31/2009  . Anxiety state 03/21/2008  . Anemia 03/08/2008  . INSOMNIA, PERSISTENT 03/08/2008  . 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 Essential hypertension 11/25/2007    Orientation RESPIRATION BLADDER Height & Weight    Self  Normal Incontinent _0  (157.5 cm) 83 lbs.  BEHAVIORAL SYMPTOMS/MOOD NEUROLOGICAL BOWEL NUTRITION STATUS   (NONE)   Incontinent Diet (Regular diet)  AMBULATORY STATUS COMMUNICATION OF NEEDS Skin   Extensive Assist Verbally Normal                       Personal Care Assistance Level of Assistance  Bathing, Feeding, Dressing Bathing Assistance: Limited assistance Feeding assistance: Limited assistance Dressing Assistance: Limited assistance     Functional Limitations Info   (NONE) Sight Info: Adequate Hearing Info: Adequate Speech Info: Adequate    SPECIAL CARE FACTORS FREQUENCY  OT (By licensed OT), PT (By licensed PT)     PT Frequency: 5X/week OT Frequency: 5X/week            Contractures Contractures Info: Not present    Additional Factors Info  Code Status, Allergies, Psychotropic Code Status Info: Full Allergies Info: Amoxicillin, aspirin, atenolol, donepezil, doxycycline, nortriptyline, amiodarone, latex, zoloft Psychotropic Info: Seroquel         Current Medications (09/09/2015):  This is the current hospital active medication list Current Facility-Administered Medications  Medication Dose Route Frequency Provider Last Rate Last Dose  . 0.9 %  sodium chloride infusion   Intravenous Once Debbe Odea, MD      . acetaminophen (TYLENOL) tablet 650 mg  650 mg Oral Q4H PRN  Debbe Odea, MD      . acyclovir (ZOVIRAX) tablet 400 mg  400 mg Oral Daily Debbe Odea, MD   400 mg at 09/09/15 0916  . azithromycin (ZITHROMAX) tablet 250 mg  250 mg Oral Daily Debbe Odea, MD   250 mg at 09/09/15 0917  . dextrose 5 % solution    Intravenous Continuous Debbe Odea, MD 100 mL/hr at 09/09/15 1131    . feeding supplement (ENSURE ENLIVE) (ENSURE ENLIVE) liquid 237 mL  237 mL Oral BID BM Clayton Bibles, RD   237 mL at 09/09/15 1400  . guaiFENesin (MUCINEX) 12 hr tablet 600 mg  600 mg Oral BID Debbe Odea, MD   600 mg at 09/09/15 0916  . heparin injection 5,000 Units  5,000 Units Subcutaneous 3 times per day Debbe Odea, MD   5,000 Units at 09/09/15 1349  . ipratropium-albuterol (DUONEB) 0.5-2.5 (3) MG/3ML nebulizer solution 3 mL  3 mL Nebulization Q6H PRN Debbe Odea, MD      . LORazepam (ATIVAN) injection 0.25-0.5 mg  0.25-0.5 mg Intravenous Q6H PRN Davonna Belling, MD   0.5 mg at 09/08/15 0120  . megestrol (MEGACE) tablet 160 mg  320 mg/day Oral BID Debbe Odea, MD      . pantoprazole (PROTONIX) EC tablet 40 mg  40 mg Oral Daily Debbe Odea, MD   40 mg at 09/09/15 1000  . QUEtiapine (SEROQUEL) tablet 50 mg  50 mg Oral QHS Debbe Odea, MD   50 mg at 09/08/15 2220  . sodium chloride 0.9 % injection 10-40 mL  10-40 mL Intracatheter PRN Debbe Odea, MD   10 mL at 09/09/15 0119   Facility-Administered Medications Ordered in Other Encounters  Medication Dose Route Frequency Provider Last Rate Last Dose  . 0.9 %  sodium chloride infusion   Intravenous Once Heath Lark, MD      . sodium chloride 0.9 % injection 10 mL  10 mL Intravenous PRN Heath Lark, MD   10 mL at 08/01/15 1405     Discharge Medications: Please see discharge summary for a list of discharge medications.  Relevant Imaging Results:  Relevant Lab Results:   Additional Information [id number]  Rigoberto Noel, LCSW

## 2015-09-09 NOTE — Progress Notes (Signed)
Initial Nutrition Assessment  DOCUMENTATION CODES:   Severe malnutrition in context of chronic illness, Underweight  INTERVENTION:   -Monitor magnesium, potassium, and phosphorus daily for at least 3 days, MD to replete as needed, as pt is at risk for refeeding syndrome given severe malnutrition. -Provide Ensure Enlive po BID, each supplement provides 350 kcal and 20 grams of protein -Provide daily snacks -Encourage PO intake of small frequent meals/snacks -RD to continue to monitor for plan  NUTRITION DIAGNOSIS:   Malnutrition related to chronic illness as evidenced by percent weight loss, energy intake < or equal to 75% for > or equal to 1 month, moderate depletion of body fat, severe depletion of muscle mass.  GOAL:   Patient will meet greater than or equal to 90% of their needs  MONITOR:   PO intake, Supplement acceptance, Labs, Weight trends, Skin, I & O's  REASON FOR ASSESSMENT:   Consult Assessment of nutrition requirement/status  ASSESSMENT:   81 y.o. female with multiple myeloma in remission, dementia, hypertension who was recently admitted to the hospital on 11/25 for confusion and dehydration. After being adequately hydrated she was discharged to nursing facility. She returns now because of a cough which has not improved on ciprofloxacin. She is again noted to be severely dehydrated and has continued to have poor by mouth intake. She was started on Megace yesterday by her oncologist.  Patient in room with daughter at bedside. Per daughter, she has eaten very little over the last month. Denies any swallowing or chewing issues. Pt eating ~75% of a meal yesterday. Pt was drinking Ensure compacts PTA. Pt has been ordered Megace. Pt was c/o pain but unable to tell daughter where she hurt. Daughter states that she is considering tube feeding for patient, still considering it at this time. Concern for refeeding at this time d/t severe malnutrition status.   RD to order  Ensure supplements and a variety of snacks to encourage small frequent intakes. Placed care orders as pt's daughter stated that snacks were not provided last admission when they were supposed to be ordered.  Pt has lost 22 lb since 9/15 (21% weight loss x 3 months, significant for time frame).  Labs reviewed: Low K Mg WNL  Diet Order:  Diet regular Room service appropriate?: Yes; Fluid consistency:: Thin  Skin:  Reviewed, no issues  Last BM:  12/11  Height:   Ht Readings from Last 1 Encounters:  09/07/15 5' 2.5" (1.588 m)    Weight:   Wt Readings from Last 1 Encounters:  09/08/15 83 lb 1.8 oz (37.7 kg)    Ideal Body Weight:  52.3 kg  BMI:  Body mass index is 14.95 kg/(m^2).  Estimated Nutritional Needs:   Kcal:  1300-1500  Protein:  60-70g  Fluid:  1.5L/day  EDUCATION NEEDS:   No education needs identified at this time  Clayton Bibles, MS, RD, LDN Pager: [phone] After Hours Pager: [phone]

## 2015-09-10 ENCOUNTER — Encounter: Payer: Self-pay | Admitting: Hematology and Oncology

## 2015-09-10 LAB — BASIC METABOLIC PANEL
ANION GAP: 6 (ref 5–15)
BUN: 17 mg/dL (ref 6–20)
CHLORIDE: 110 mmol/L (ref 101–111)
CO2: 21 mmol/L — ABNORMAL LOW (ref 22–32)
Calcium: 8.5 mg/dL — ABNORMAL LOW (ref 8.9–10.3)
Creatinine, Ser: 0.61 mg/dL (ref 0.44–1.00)
GFR calc non Af Amer: >60 mL/min (ref >60)
Glucose, Bld: 90 mg/dL (ref 65–99)
POTASSIUM: 4.5 mmol/L (ref 3.5–5.1)
SODIUM: 137 mmol/L (ref 135–145)

## 2015-09-10 LAB — CBC
HEMATOCRIT: 29.6 % — AB (ref 36.0–46.0)
HEMOGLOBIN: 9.2 g/dL — AB (ref 12.0–15.0)
MCH: 29.3 pg (ref 26.0–34.0)
MCHC: 31.1 g/dL (ref 30.0–36.0)
MCV: 94.3 fL (ref 78.0–100.0)
Platelets: 231 10*3/uL (ref 150–400)
RBC: 3.14 MIL/uL — AB (ref 3.87–5.11)
RDW: 18 % — ABNORMAL HIGH (ref 11.5–15.5)
WBC: 3.2 10*3/uL — AB (ref 4.0–10.5)

## 2015-09-10 MED ORDER — BISACODYL 10 MG RE SUPP
10.0000 mg | Freq: Once | RECTAL | Status: AC
  Administered 2015-09-10: 10 mg via RECTAL
  Filled 2015-09-10: qty 1

## 2015-09-10 NOTE — Progress Notes (Signed)
[facility] Progress Note   Victoria [name]  [id number]  DOB: May 07, 1932  DOA: 09/07/2015 PCP: Heath Lark, MD  Brief narrative: Victoria [name] is a 81 y.o. female with multiple myeloma in remission, dementia, hypertension with severe malnutrition who was recently admitted to the hospital on 11/25 for confusion and dehydration. After being adequately hydrated she was discharged to nursing facility. She returns now because her daughter is concerned about a cough which has not improved on ciprofloxacin. She is again noted to be severely dehydrated and has continued to have poor by mouth intake. She was started on Megace a day before admission by her oncologist. Unfortunately, despite many discussions between Dr Alvy Bimler, Palliative care RNs and myself with the patient's daughter and son, they continue to decline palliative care for their mother who is not eating enough to meet her body's nutritional needs.    Subjective: Continues to be confused.   Assessment/Plan: Principal Problem:   Respiratory failure with hypoxia- acute bronchitis -Oxygen level 99% on 3 L in the ER-was weaned off of O2 the next day- 98% on room air today -"Minimal right lower lobe infiltrate" noted on chest x-ray on 12/8 which was obtained at the nursing facility -Chest x-ray on 12/9 revealed low lung volumes and interstitial disease without any obvious infiltrate -Started on HCAP coverage on admission for possible pneumonia however, after hydration her repeat chest x-ray is morning is still negative for infiltrate - initially on Vancomycin and Azactam- as CXR was negative for infiltrate x 2 and therefore transitioned to Zithromax only for treatment of acute bronchitis - CXR re-ordered last night as she sounded congested- CXR reveal mild fluid overload and RLL infiltrate vs atelectasis- lungs also very hypo inflated on CXR- will cont to treat as acute bronchitis- cont Zpak- no fever, no leukocytosis    Active  Problems: Normocytic Anemia-  anemia due to multiple myeloma and anemia of chronic disease -With hydration, hemoglobin dropped to 6.8- transfused one unit of packed red blood cells  - Hb up to 8.5 -anemia panel on 4/16 was consistent with anemia of chronic disease -she had a normal folate level and a B-12 of 596 couple of weeks ago  Dehydration - hydrated now mildly fluid overloaded- stopped IVF- PO intake near nothing and therefore will have recurrent dehydration quite soon- explained to daughter today  [name]  -She had short runs of V. tach on 12/9/ and 12/10--this may have been secondary to anemia, hypokalemia and dehydration -Obtained 2-D echo- has mod MR -replace potassium and transfuse blood-checked magnesium level (1.7) and replaced- now > 2 -Has been evaluated by Dr. Debara Pickett in the past-Stress test in the spring of this year did not reveal any reversible defects - no further V-tach   Hypernatremia/hyperchloremia - resolved when switched normal saline to D5 water   Hypokalemia - replaced  Severe protein calorie malnutrition/failure to thrive  -Thyroid function tests last week were normal -Started on Megace couple of days ago- will continue- increasing the dose -Daughter was curious about a feeding tube - explained the pros and cons of this and explained that she has relapsing Multiple Myeloma for which her oncologist is not recommending further chemo and a PEG is not a good option for her.     Dementia with behavioral disturbance -Increased Seroquel to 50 mg     Multiple myeloma in relapse  -Oncology recommending hospice due to anorexia and frailty- they are not wanting DNR  Constipation - give Dulcolax suppository today -  follow  Disposition: Due to ongoing failure to thrive with weight loss and poor by mouth intake, palliative care has been consulted however, both daughter and son would like to continue to make attempts to increase her by mouth intake and would like her to  continue to be a full code. Discussed with Dr Alvy Bimler today who tells me that she has told the daughter recently that her mother has only a few months to live.    Code Status:     Code Status Orders        Start     Ordered   09/07/15 1828  Full code   Continuous     09/07/15 1828    Advance Directive Documentation        Most Recent Value   Type of Advance Directive  Healthcare Power of Attorney   Pre-existing out of facility DNR order (yellow form or pink MOST form)     "MOST" Form in Place?       Family Communication: Daughter at bedside Disposition Plan: Continue to follow on telemetry- SW finding another nursing home for patient as daughter unsatisfied will pervious facility- explained to daughter that her mother will be discharged home tomorrow DVT prophylaxis: Heparin Consultants: Palliative care Procedures: None  Antibiotics: Anti-infectives    Start     Dose/Rate Route Frequency Ordered Stop   09/09/15 1000  azithromycin (ZITHROMAX) tablet 250 mg     250 mg Oral Daily 09/08/15 1144 09/13/15 0959   09/08/15 2000  vancomycin (VANCOCIN) 500 mg in sodium chloride 0.9 % 100 mL IVPB  Status:  Discontinued     500 mg 100 mL/hr over 60 Minutes Intravenous Every 24 hours 09/07/15 1848 09/07/15 2108   09/08/15 1300  azithromycin (ZITHROMAX) tablet 500 mg     500 mg Oral Daily 09/08/15 1144 09/08/15 1420   09/08/15 1000  acyclovir (ZOVIRAX) tablet 400 mg     400 mg Oral Daily 09/07/15 1816     09/08/15 0400  aztreonam (AZACTAM) 1 g in dextrose 5 % 50 mL IVPB  Status:  Discontinued     1 g 100 mL/hr over 30 Minutes Intravenous Every 8 hours 09/07/15 1849 09/08/15 1144   09/07/15 2200  aztreonam (AZACTAM) 2 g in dextrose 5 % 50 mL IVPB  Status:  Discontinued     2 g 100 mL/hr over 30 Minutes Intravenous 3 times per day 09/07/15 1828 09/07/15 1849   09/07/15 2200  vancomycin (VANCOCIN) 500 mg in sodium chloride 0.9 % 100 mL IVPB  Status:  Discontinued     500 mg 100 mL/hr  over 60 Minutes Intravenous Every 24 hours 09/07/15 2108 09/08/15 1144   09/07/15 1900  vancomycin (VANCOCIN) 500 mg in sodium chloride 0.9 % 100 mL IVPB  Status:  Discontinued     500 mg 100 mL/hr over 60 Minutes Intravenous  Once 09/07/15 1848 09/07/15 2225   09/07/15 1900  aztreonam (AZACTAM) 1 g in dextrose 5 % 50 mL IVPB     1 g 100 mL/hr over 30 Minutes Intravenous  Once 09/07/15 1849 09/07/15 2112      Objective: Filed Weights   09/07/15 2005 09/08/15 0400  Weight: 37.7 kg (83 lb 1.8 oz) 37.7 kg (83 lb 1.8 oz)    Intake/Output Summary (Last 24 hours) at 09/10/15 1227 Last data filed at 09/10/15 1037  Gross per 24 hour  Intake 2916.33 ml  Output      0 ml  Net 2916.33 ml  Vitals Filed Vitals:   09/09/15 0546 09/09/15 1443 09/09/15 2029 09/10/15 0534  BP: 147/73 114/52 134/71 123/51  Pulse: 66 84 76 71  Temp: 97.8 F (36.6 C) 97.3 F (36.3 C) 98.1 F (36.7 C) 98.3 F (36.8 C)  TempSrc: Axillary Axillary Oral Oral  Resp: $Remo'20 20 20 22  'QGOJu$ Height:      Weight:      SpO2: 98% 99% 96% 98%    Exam:  General:  Pt is alert, confused, not in acute distress, cachectic   HEENT: No icterus, No thrush, oral mucosa moist  Cardiovascular: regular rate and rhythm, S1/S2 No murmur  Respiratory: clear to auscultation bilaterally   Abdomen: Soft, +Bowel sounds, non tender, non distended, no guarding  MSK: No LE edema, cyanosis or clubbing  Data Reviewed: Basic Metabolic Panel:  Recent Labs Lab 09/05/15 0904 09/07/15 1610 09/08/15 0818 09/08/15 1115 09/09/15 0500 09/10/15 0200  NA 144 146* 147*  --  142 137  K 3.6 3.5 3.1*  --  3.3* 4.5  CL  --  113* 121*  --  114* 110  CO2 $Re'22 25 23  'lBv$ --  22 21*  GLUCOSE 73 128* 87  --  90 90  BUN 26.3* 30* 25*  --  20 17  CREATININE 0.8 0.89 0.69  --  0.68 0.61  CALCIUM 9.4 9.1 8.3*  --  8.4* 8.5*  MG  --   --   --  1.7 2.1  --    Liver Function Tests:  Recent Labs Lab 09/05/15 0904 09/07/15 1610  AST 14 20  ALT 10  16  ALKPHOS 61 57  BILITOT 0.96 0.9  PROT 8.6* 8.3*  ALBUMIN 2.2* 2.6*   No results for input(s): LIPASE, AMYLASE in the last 168 hours. No results for input(s): AMMONIA in the last 168 hours. CBC:  Recent Labs Lab 09/05/15 0903 09/07/15 1610 09/08/15 0818 09/09/15 0500 09/10/15 0200  WBC 2.7* 5.5 3.4* 4.2 3.2*  NEUTROABS 2.1 4.6  --   --   --   HGB 8.5* 8.2* 6.8* 8.5* 9.2*  HCT 27.5* 26.3* 21.9* 27.3* 29.6*  MCV 96.5 96.0 96.1 93.5 94.3  PLT 298 279 217 210 231   Cardiac Enzymes: No results for input(s): CKTOTAL, CKMB, CKMBINDEX, TROPONINI in the last 168 hours. BNP (last 3 results)  Recent Labs  09/09/15 2115  BNP 345.6*    ProBNP (last 3 results) No results for input(s): PROBNP in the last 8760 hours.  CBG: No results for input(s): GLUCAP in the last 168 hours.  Recent Results (from the past 240 hour(s))  MRSA PCR Screening     Status: None   Collection Time: 09/07/15  8:17 PM  Result Value Ref Range Status   MRSA by PCR NEGATIVE NEGATIVE Final    Comment:        The GeneXpert MRSA Assay (FDA approved for NASAL specimens only), is one component of a comprehensive MRSA colonization surveillance program. It is not intended to diagnose MRSA infection nor to guide or monitor treatment for MRSA infections.      Studies: Dg Chest Port 1 View  09/09/2015  CLINICAL DATA:  Dyspnea, cough. EXAM: PORTABLE CHEST 1 VIEW COMPARISON:  September 08, 2015. FINDINGS: Stable cardiomediastinal silhouette. Mild central pulmonary vascular congestion is noted. Hypoinflation of the lungs is noted. No pneumothorax or pleural effusion is noted. Right internal jugular Port-A-Cath is unchanged in position, with distal tip in expected position of cavoatrial junction. Mildly increased right basilar opacity  is noted concerning for subsegmental atelectasis or pneumonia. Left lung is clear. Old right rib fracture is noted. IMPRESSION: Mild central pulmonary vascular congestion. Mildly  increased right basilar opacity concerning for pneumonia or atelectasis. Electronically Signed   By: Marijo Conception, M.D.   On: 09/09/2015 20:55   Dg Abd Portable 1v  09/09/2015  CLINICAL DATA:  Upper abdominal pain. Personal history of diverticulosis and bowel perforation. EXAM: PORTABLE ABDOMEN - 1 VIEW COMPARISON:  One-view abdomen 08/24/2015. FINDINGS: The bowel gas pattern is normal. There is no obstruction or free air. Moderate stool is present at the rectum. Multiple lytic lesions are again noted. A large lytic lesion is again seen at the right iliac wing. Scoliosis is stable. IMPRESSION: 1. Normal bowel gas pattern. 2. Moderate stool at the rectum. 3. Multiple lytic lesions compatible with multiple myeloma. The largest is at the right iliac wing. Electronically Signed   By: San Morelle M.D.   On: 09/09/2015 20:58    Scheduled Meds:  Scheduled Meds: . sodium chloride   Intravenous Once  . acyclovir  400 mg Oral Daily  . azithromycin  250 mg Oral Daily  . bisacodyl  10 mg Rectal Once  . feeding supplement (ENSURE ENLIVE)  237 mL Oral BID BM  . guaiFENesin  600 mg Oral BID  . heparin subcutaneous  5,000 Units Subcutaneous 3 times per day  . megestrol  320 mg/day Oral BID  . pantoprazole  40 mg Oral Daily  . QUEtiapine  50 mg Oral QHS   Continuous Infusions:    Time spent on care of this patient: 30 min   [place], MD 09/10/2015, 12:27 PM  LOS: 3 days   [facility] Office  [phone] Pager - Text Page per [email or web address] If 7PM-7AM, please contact night-coverage [email or web address]

## 2015-09-10 NOTE — Care Management Important Message (Signed)
Important Message  Patient Details  Name: Victoria [name] MRN: [id number] Date of Birth: [birth year]   Medicare Important Message Given:  Yes    Camillo Flaming 09/10/2015, 1:46 PMImportant Message  Patient Details  Name: Victoria [name] MRN: [id number] Date of Birth: Feb 20, 1932   Medicare Important Message Given:  Yes    Camillo Flaming 09/10/2015, 1:46 PM

## 2015-09-10 NOTE — Progress Notes (Signed)
Faxed megestrol pa form to [facility] °

## 2015-09-10 NOTE — Progress Notes (Signed)
PT Cancellation Note  Patient Details Name: CATRINIA RIGGINS MRN: [id number] DOB: Nov 27, 1931   Cancelled Treatment:    Reason Eval/Treat Not Completed: Fatigue/lethargy limiting ability to participate. Attempted PT eval-daughter requested PT check back later. Will check back as schedule permits.    Weston Anna, MPT Pager: [phone]

## 2015-09-10 NOTE — Progress Notes (Signed)
PT Cancellation Note  Patient Details Name: Victoria [name] MRN: [id number] DOB: Jan 13, 1932   Cancelled Treatment:    Reason Eval/Treat Not Completed: 2nd attempt to evaluate pt on today. Son preferred PT work with pt another time. Will check back on tomorrow. Thanks.    Weston Anna, MPT Pager: [phone]

## 2015-09-10 NOTE — Clinical Social Work Placement (Signed)
   CLINICAL SOCIAL WORK PLACEMENT  NOTE  Date:  09/10/2015  Patient Details  Name: Victoria [name] MRN: [id number] Date of Birth: 1932/01/08  Clinical Social Work is seeking post-discharge placement for this patient at the [place] level of care (*CSW will initial, date and re-position this form in  chart as items are completed):  Yes   Patient/family provided with [place] Work Department's list of facilities offering this level of care within the geographic area requested by the patient (or if unable, by the patient's family).  Yes   Patient/family informed of their freedom to choose among providers that offer the needed level of care, that participate in Medicare, Medicaid or managed care program needed by the patient, have an available bed and are willing to accept the patient.  Yes   Patient/family informed of [facility]'s ownership interest in [facility] and [facility], as well as of the fact that they are under no obligation to receive care at these facilities.  PASRR submitted to EDS on       PASRR number received on       Existing PASRR number confirmed on 09/09/15     FL2 transmitted to all facilities in geographic area requested by pt/family on 09/09/15     FL2 transmitted to all facilities within larger geographic area on       Patient informed that his/her managed care company has contracts with or will negotiate with certain facilities, including the following:        Yes   Patient/family informed of bed offers received.  Patient chooses bed at [facility]     Physician recommends and patient chooses bed at      Patient [facility] on  .  Patient to be transferred to facility by       Patient family notified on   of transfer.  Name of family member notified:        PHYSICIAN Please sign FL2     Additional Comment:    _______________________________________________ Ladell Pier,  LCSW 09/10/2015, 12:19 PM

## 2015-09-10 NOTE — Evaluation (Signed)
Physical Therapy Evaluation Patient Details Name: Victoria [name] MRN: [id number] DOB: [birth year] Today's Date: 09/10/2015   History of Present Illness  81 yo female admitted with respiratory failure, FTT. Hx of multiple myeloma, HTN, chronic anemia, dementia.   Clinical Impression  Bed level eval only-Max assist for bed mobility. Pt is very weak. C/o head and bottom pain during session. Pt able to sit EOB ~1 minute before needing to return to supine. Recommend return to SNF.    Follow Up Recommendations SNF;Supervision/Assistance - 24 hour    Equipment Recommendations  None recommended by PT    Recommendations for Other Services       Precautions / Restrictions Precautions Precautions: Fall Restrictions Weight Bearing Restrictions: No      Mobility  Bed Mobility Overal bed mobility: Needs Assistance Bed Mobility: Rolling;Supine to Sit;Sit to Supine Rolling: Min assist   Supine to sit: Max assist;HOB elevated Sit to supine: Max assist;HOB elevated   General bed mobility comments: Increased time. Cues/encouragement for pt to do as much as she could. Assist for trunk and bil LEs. Utilized bedpad for scooting, positioning.   Transfers                 General transfer comment: NT-pt unable on today  Ambulation/Gait             General Gait Details: NT-pt unable on today  Stairs            Wheelchair Mobility    Modified Rankin (Stroke Patients Only)       Balance Overall balance assessment: Needs assistance Sitting-balance support: Bilateral upper extremity supported;Feet unsupported Sitting balance-Leahy Scale: Poor Sitting balance - Comments: Pt sat EOB ~1 mintue during session. Assist needed to stabilize.                                      Pertinent Vitals/Pain Pain Assessment: Faces Faces Pain Scale: Hurts whole lot Pain Location: bottom, head Pain Descriptors / Indicators: Aching;Sore Pain Intervention(s): Monitored  during session;Repositioned;Limited activity within patient's tolerance    Home Living Family/patient expects to be discharged to:: Unsure Living Arrangements: Children Available Help at Discharge: Family Type of Home: House Home Access: Stairs to enter Entrance Stairs-Rails: Can reach both Entrance Stairs-Number of Steps: 4 Home Layout: One level Home Equipment: Walker - 2 wheels;Cane - single point;Bedside commode Additional Comments: info taken from previous admission ~ 2 weeks ago. No family present during session (son stepped out)    Prior Function Level of Independence: Needs assistance   Gait / Transfers Assistance Needed: uses walker           Hand Dominance        Extremity/Trunk Assessment   Upper Extremity Assessment: Generalized weakness           Lower Extremity Assessment: Generalized weakness      Cervical / Trunk Assessment: Kyphotic  Communication   Communication: No difficulties  Cognition Arousal/Alertness: Awake/alert Behavior During Therapy: WFL for tasks assessed/performed Overall Cognitive Status: History of cognitive impairments - at baseline                      General Comments      Exercises        Assessment/Plan    PT Assessment Patient needs continued PT services  PT Diagnosis Difficulty walking;Generalized weakness;Acute pain   PT Problem List Decreased strength;Decreased activity  tolerance;Decreased balance;Decreased mobility;Decreased knowledge of use of DME;Pain  PT Treatment Interventions Functional mobility training;Therapeutic activities;Patient/family education;Therapeutic exercise;Balance training;DME instruction   PT Goals (Current goals can be found in the Care Plan section) Acute Rehab PT Goals Patient Stated Goal: to feel better. less pain. PT Goal Formulation: With patient Time For Goal Achievement: 09/24/15 Potential to Achieve Goals: Fair    Frequency Min 2X/week   Barriers to discharge         Co-evaluation               End of Session   Activity Tolerance: Patient limited by fatigue;Patient limited by pain Patient left: in bed;with call bell/phone within reach;with bed alarm set           Time: 0990-6893 PT Time Calculation (min) (ACUTE ONLY): 8 min   Charges:   PT Evaluation $Initial PT Evaluation Tier I: 1 Procedure     PT G Codes:        Weston Anna, MPT Pager: [phone]

## 2015-09-10 NOTE — Progress Notes (Signed)
CSW continuing to follow.   Pt admitted from [facility], but pt family wanting another option for SNF.   CSW followed up with pt son and pt daughter at bedside to discuss alternative SNF options.   CSW provided list of SNF bed offers.  Pt daughter discussed that she was working with [facility] and [facility] to transfer pt to [place] from [place], but transfer had not yet happened. Pt daughter and pt son agreeable to [facility] and [facility] upon discharge from the hospital.  [place] contacted [facility] and [facility] who confirmed that facility could offer pt a bed and accept pt when medically ready from the hospital. [place] notified pt daughter and pt son.   Per MD, anticipate discharge tomorrow. Pt daughter and pt son aware of anticipation of discharge tomorrow.   CSW to continue to follow to provide support and assist with pt discharge to [facility] tomorrow.  Alison Murray, MSW, [place] Work [phone]

## 2015-09-11 ENCOUNTER — Encounter: Payer: Self-pay | Admitting: Hematology and Oncology

## 2015-09-11 DIAGNOSIS — F0391 Unspecified dementia with behavioral disturbance: Secondary | ICD-10-CM

## 2015-09-11 DIAGNOSIS — R627 Adult failure to thrive: Secondary | ICD-10-CM

## 2015-09-11 DIAGNOSIS — J209 Acute bronchitis, unspecified: Principal | ICD-10-CM

## 2015-09-11 DIAGNOSIS — J9691 Respiratory failure, unspecified with hypoxia: Secondary | ICD-10-CM

## 2015-09-11 LAB — CBC
HCT: 29.4 % — ABNORMAL LOW (ref 36.0–46.0)
Hemoglobin: 9.2 g/dL — ABNORMAL LOW (ref 12.0–15.0)
MCH: 28.9 pg (ref 26.0–34.0)
MCHC: 31.3 g/dL (ref 30.0–36.0)
MCV: 92.5 fL (ref 78.0–100.0)
PLATELETS: 219 10*3/uL (ref 150–400)
RBC: 3.18 MIL/uL — AB (ref 3.87–5.11)
RDW: 17.3 % — ABNORMAL HIGH (ref 11.5–15.5)
WBC: 3.6 10*3/uL — AB (ref 4.0–10.5)

## 2015-09-11 LAB — BASIC METABOLIC PANEL
Anion gap: 4 — ABNORMAL LOW (ref 5–15)
BUN: 15 mg/dL (ref 6–20)
CALCIUM: 8.8 mg/dL — AB (ref 8.9–10.3)
CHLORIDE: 110 mmol/L (ref 101–111)
CO2: 22 mmol/L (ref 22–32)
CREATININE: 0.7 mg/dL (ref 0.44–1.00)
Glucose, Bld: 74 mg/dL (ref 65–99)
Potassium: 3.8 mmol/L (ref 3.5–5.1)
SODIUM: 136 mmol/L (ref 135–145)

## 2015-09-11 MED ORDER — ENSURE ENLIVE PO LIQD: 237.0000 mL | Freq: Two times a day (BID) | ORAL | Status: DC

## 2015-09-11 MED ORDER — GUAIFENESIN ER 600 MG PO TB12: 600.0000 mg | ORAL_TABLET | Freq: Two times a day (BID) | ORAL | Status: DC

## 2015-09-11 MED ORDER — PANTOPRAZOLE SODIUM 40 MG PO TBEC: 40.0000 mg | DELAYED_RELEASE_TABLET | Freq: Every day | ORAL | Status: DC

## 2015-09-11 MED ORDER — IPRATROPIUM-ALBUTEROL 0.5-2.5 (3) MG/3ML IN SOLN: 3.0000 mL | Freq: Four times a day (QID) | RESPIRATORY_TRACT | Status: DC | PRN

## 2015-09-11 MED ORDER — LORAZEPAM 0.5 MG PO TABS: 0.5000 mg | ORAL_TABLET | Freq: Three times a day (TID) | ORAL | Status: DC | PRN

## 2015-09-11 MED ORDER — HEPARIN SOD (PORK) LOCK FLUSH 100 UNIT/ML IV SOLN
500.0000 [IU] | INTRAVENOUS | Status: AC | PRN
  Administered 2015-09-11: 500 [IU]

## 2015-09-11 MED ORDER — QUETIAPINE FUMARATE 50 MG PO TABS: 50.0000 mg | ORAL_TABLET | Freq: Every day | ORAL | Status: DC

## 2015-09-11 MED ORDER — AZITHROMYCIN 250 MG PO TABS: ORAL_TABLET | ORAL | Status: DC

## 2015-09-11 NOTE — Progress Notes (Signed)
[facility] [id number] denied megestrol due to patient's age and it being a high risk medication for her; they believe norethidrone acetate should be sufficient, informed nurse.

## 2015-09-11 NOTE — Progress Notes (Signed)
Report called to [facility]. Hospital course reviewed and all questions answered. Callie Fielding RN

## 2015-09-11 NOTE — Discharge Summary (Signed)
Physician Discharge Summary  EBONYE READE MHD:[id number] DOB: [birth year] DOA: 09/07/2015  PCP: Heath Lark, MD  Admit date: 09/07/2015 Discharge date: 09/11/2015  Time spent: 35 minutes  Recommendations for Outpatient Follow-up:  1. azithro through 12/14 2. Incentive spirometry   Discharge Diagnoses:  Principal Problem:   Respiratory failure with hypoxia (HCC) Active Problems:   Dementia with behavioral disturbance   Multiple myeloma in relapse (HCC)   Protein-calorie malnutrition, severe (HCC)   FTT (failure to thrive) in adult   Acute bronchitis   Discharge Condition: stable  Diet recommendation: regular  Filed Weights   09/07/15 2005 09/08/15 0400  Weight: 37.7 kg (83 lb 1.8 oz) 37.7 kg (83 lb 1.8 oz)    History of present illness:  AVANNA SOWDER is a 81 y.o. female with multiple myeloma in remission, dementia, hypertension with severe malnutrition who was recently admitted to the hospital on 11/25 for confusion and dehydration. After being adequately hydrated she was discharged to nursing facility. She returns now because her daughter is concerned about a cough which has not improved on ciprofloxacin. She is again noted to be severely dehydrated and has continued to have poor by mouth intake. She was started on Megace a day before admission by her oncologist. Unfortunately, despite many discussions between Dr Alvy Bimler, Palliative care RNs and myself with the patient's daughter and son, they continue to decline palliative care for their mother who is not eating enough to meet her body's nutritional needs.   Hospital Course:  Respiratory failure with hypoxia- acute bronchitis -Oxygen level 99% on 3 L in the ER-was weaned off of O2 the next day- 98% on room air today -"Minimal right lower lobe infiltrate" noted on chest x-ray on 12/8 which was obtained at the nursing facility -Chest x-ray on 12/9 revealed low lung volumes and interstitial disease without any obvious  infiltrate -Started on HCAP coverage on admission for possible pneumonia however, after hydration her repeat chest x-ray is morning is still negative for infiltrate - initially on Vancomycin and Azactam- as CXR was negative for infiltrate x 2 and therefore transitioned to Zithromax only for treatment of acute bronchitis - CXR re-ordered last night as she sounded congested- CXR reveal mild fluid overload and RLL infiltrate vs atelectasis- lungs also very hypo inflated on CXR- will cont to treat as acute bronchitis- cont Zpak- no fever, no leukocytosis   Normocytic Anemia- anemia due to multiple myeloma and anemia of chronic disease -With hydration, hemoglobin dropped to 6.8- transfused one unit of packed red blood cells - Hb up to 8.5 -anemia panel on 4/16 was consistent with anemia of chronic disease -she had a normal folate level and a B-12 of 596 couple of weeks ago  Dehydration - hydrated now mildly fluid overloaded- stopped IVF- PO intake near nothing and therefore will have recurrent dehydration quite soon- explained to daughter by Dr. Phineas Semen. Loralee Pacas  -She had short runs of V. tach on 12/9/ and 12/10--this may have been secondary to anemia, hypokalemia and dehydration -Obtained 2-D echo- has mod MR -replace potassium and transfuse blood-checked magnesium level (1.7) and replaced- now > 2 -Has been evaluated by Dr. Debara Pickett in the past-Stress test in the spring of this year did not reveal any reversible defects - no further V-tach   Hypernatremia/hyperchloremia - resolved when switched normal saline to D5 water  -encourage PO entake of water  Hypokalemia - replaced  Severe protein calorie malnutrition/failure to thrive -Thyroid function tests last week were normal -Started  on Megace couple of days ago- will continue- increasing the dose -Daughter was curious about a feeding tube - explained the pros and cons of this and explained that she has relapsing Multiple Myeloma for which  her oncologist is not recommending further chemo and a PEG is not a good option for her.    Dementia with behavioral disturbance -Increased Seroquel to 50 mg QHS   Multiple myeloma in relapse  -Oncology recommending hospice due to anorexia and frailty- they are not wanting DNR  Constipation - give Dulcolax suppository today - follow  Procedures:    Consultations:    Discharge Exam: Filed Vitals:   09/10/15 1323   BP: 125/59   Pulse: 78   Temp: 98.6 F (37 C)   Resp: 18     General: awake,daughter states ate better yesterday than today   Discharge Instructions   Discharge Instructions    Diet general    Complete by:  As directed      Discharge instructions    Complete by:  As directed   Incentive spirometry Azithromycin through 12/14     Increase activity slowly    Complete by:  As directed           Current Discharge Medication List    START taking these medications   Details  azithromycin (ZITHROMAX) 250 MG tablet Daily through 12/14 Qty: 6 each, Refills: 0    feeding supplement, ENSURE ENLIVE, (ENSURE ENLIVE) LIQD Take 237 mLs by mouth 2 (two) times daily between meals. Qty: 237 mL, Refills: 12    guaiFENesin (MUCINEX) 600 MG 12 hr tablet Take 1 tablet (600 mg total) by mouth 2 (two) times daily.    ipratropium-albuterol (DUONEB) 0.5-2.5 (3) MG/3ML SOLN Take 3 mLs by nebulization every 6 (six) hours as needed. Qty: 360 mL    pantoprazole (PROTONIX) 40 MG tablet Take 1 tablet (40 mg total) by mouth daily.      CONTINUE these medications which have CHANGED   Details  LORazepam (ATIVAN) 0.5 MG tablet Take 1 tablet (0.5 mg total) by mouth every 8 (eight) hours as needed. for anxiety Qty: 10 tablet, Refills: 0    QUEtiapine (SEROQUEL) 50 MG tablet Take 1 tablet (50 mg total) by mouth at bedtime.      CONTINUE these medications which have NOT CHANGED   Details  acetaminophen (TYLENOL) 325 MG tablet Take 2 tablets (650 mg total) by mouth every 6  (six) hours as needed for mild pain (or Fever >/= 101).    acyclovir (ZOVIRAX) 400 MG tablet Take 1 tablet (400 mg total) by mouth daily. Qty: 60 tablet, Refills: 11   Associated Diagnoses: Multiple myeloma in relapse (HCC)    cholecalciferol (VITAMIN D) 1000 UNITS tablet Take 2,000 Units by mouth daily.    guaiFENesin (ROBITUSSIN) 100 MG/5ML SOLN Take 20 mLs (400 mg total) by mouth every 6 (six) hours as needed for cough or to loosen phlegm. Qty: 1200 mL, Refills: 0   Associated Diagnoses: HAP (hospital-acquired pneumonia)    megestrol (MEGACE) 40 MG tablet Take 1 tablet (40 mg total) by mouth 2 (two) times daily. Qty: 28 tablet, Refills: 0   Associated Diagnoses: Multiple myeloma in relapse ([place]); Protein malnutrition (HCC)    Multiple Vitamins-Minerals (MULTIVITAMIN & MINERAL PO) Take 1 tablet by mouth daily.      STOP taking these medications     ciprofloxacin (CIPRO) 500 MG tablet      dexamethasone (DECADRON) 4 MG tablet  lidocaine-prilocaine (EMLA) cream        Allergies  Allergen Reactions  . Amoxicillin Hives and Itching    Has patient had a PCN reaction causing immediate rash, facial/tongue/throat swelling, SOB or lightheadedness with hypotension: No Has patient had a PCN reaction causing severe rash involving mucus membranes or skin necrosis: No Has patient had a PCN reaction that required hospitalization No Has patient had a PCN reaction occurring within the last 10 years: No If all of the above answers are "NO", then may proceed with Cephalosporin use.  . Aspirin Other (See Comments)    Child hood allergy   . Atenolol     Hair loss  . Donepezil Other (See Comments)    agitation   . Doxycycline     Altered mental status   . Nortriptyline     agitation  . Amiodarone Rash  . Latex Dermatitis  . Zoloft [Sertraline Hcl] Anxiety    Increased agitation   Follow-up Information    Follow up with [facility].   Specialty:  [place] information:   [address] [place] [phone]      Follow up with [facility], NI, MD.   Specialty:  Hematology and Oncology   Why:  As needed   Contact information:   [place] [address] [phone]        The results of significant diagnostics from this hospitalization (including imaging, microbiology, ancillary and laboratory) are listed below for reference.    Significant Diagnostic Studies: Dg Chest 2 View  09/07/2015  CLINICAL DATA:  Dry cough for 2-3 months. EXAM: CHEST  2 VIEW COMPARISON:  08/24/2015 FINDINGS: There are low lung volumes. There is bilateral interstitial thickening. There is no focal consolidation. There is no pleural effusion or pneumothorax. There is stable cardiomegaly. There is thoracic aortic atherosclerosis. There is a right-sided Port-A-Cath in satisfactory position. There is a healing right posterior sixth rib fracture. IMPRESSION: Stable cardiomegaly with mild pulmonary vascular congestion likely superimposed upon chronic interstitial disease. Electronically Signed   By: Kathreen Devoid   On: 09/07/2015 16:00   Ct Head Wo Contrast  08/24/2015  CLINICAL DATA:  Altered mental status.  Multiple myeloma EXAM: CT HEAD WITHOUT CONTRAST TECHNIQUE: Contiguous axial images were obtained from the base of the skull through the vertex without intravenous contrast. COMPARISON:  CT head 06/15/2015 FINDINGS: Generalized atrophy. Hypodensity in the cerebral white matter bilaterally unchanged compatible chronic microvascular ischemia. Negative for acute infarct.  Negative for hemorrhage or mass. Multiple low-density lesions throughout the calvarium consistent with multiple myeloma. These are unchanged. IMPRESSION: Atrophy and chronic microvascular ischemia.  No acute abnormality. Electronically Signed   By: Franchot Gallo M.D.   On: 08/24/2015 15:32   Dg Chest Port 1 View  09/09/2015  CLINICAL DATA:  Dyspnea, cough.  EXAM: PORTABLE CHEST 1 VIEW COMPARISON:  September 08, 2015. FINDINGS: Stable cardiomediastinal silhouette. Mild central pulmonary vascular congestion is noted. Hypoinflation of the lungs is noted. No pneumothorax or pleural effusion is noted. Right internal jugular Port-A-Cath is unchanged in position, with distal tip in expected position of cavoatrial junction. Mildly increased right basilar opacity is noted concerning for subsegmental atelectasis or pneumonia. Left lung is clear. Old right rib fracture is noted. IMPRESSION: Mild central pulmonary vascular congestion. Mildly increased right basilar opacity concerning for pneumonia or atelectasis. Electronically Signed   By: Marijo Conception, M.D.   On: 09/09/2015 20:55   Dg  Chest Port 1 View  09/08/2015  CLINICAL DATA:  Short of breath and cough for 5 days EXAM: PORTABLE CHEST 1 VIEW COMPARISON:  Radiograph 09/06/2005 FINDINGS: Left-sided port overlies stable cardiac silhouette. Low lung volumes. There is chronic interstitial thickening. No focal consolidation. Remote RIGHT rib fracture. IMPRESSION: Low lung volumes and chronic interstitial lung disease. Electronically Signed   By: Suzy Bouchard M.D.   On: 09/08/2015 10:49   Dg Chest Portable 1 View  08/24/2015  CLINICAL DATA:  Altered mental status today upon waking up. Loss of appetite for 2 days. Headache. EXAM: PORTABLE CHEST 1 VIEW COMPARISON:  06/14/2015 FINDINGS: Power injectable Port-A-Cath tip: 3 vertebral body levels below the carina, just into the right atrium. Tortuosity and atherosclerotic calcification of the aortic arch. Reverse lordotic projection with low lung volumes. Bibasilar subsegmental atelectasis or scarring. Stable mildly indistinct pulmonary vasculature. Upper normal heart size. No overt edema. IMPRESSION: 1. Low lung volumes with mild bibasilar atelectasis or scarring. 2. Atherosclerotic aortic arch. 3. Right power injectable Port-A-Cath tip: Just into the right atrium, 3  vertebral levels below the carina. Electronically Signed   By: Van Clines M.D.   On: 08/24/2015 15:16   Dg Abd Portable 1v  09/09/2015  CLINICAL DATA:  Upper abdominal pain. Personal history of diverticulosis and bowel perforation. EXAM: PORTABLE ABDOMEN - 1 VIEW COMPARISON:  One-view abdomen 08/24/2015. FINDINGS: The bowel gas pattern is normal. There is no obstruction or free air. Moderate stool is present at the rectum. Multiple lytic lesions are again noted. A large lytic lesion is again seen at the right iliac wing. Scoliosis is stable. IMPRESSION: 1. Normal bowel gas pattern. 2. Moderate stool at the rectum. 3. Multiple lytic lesions compatible with multiple myeloma. The largest is at the right iliac wing. Electronically Signed   By: San Morelle M.D.   On: 09/09/2015 20:58   Dg Abd Portable 1v  08/24/2015  CLINICAL DATA:  81 year old female with multiple myeloma and altered mental status. EXAM: PORTABLE ABDOMEN - 1 VIEW COMPARISON:  None. FINDINGS: Nonobstructed bowel gas pattern. Colonic stool burden does not appear excessive. Multifocal lytic lesions throughout the skeletal structures the largest of which has largely replaced the right iliac wing. Findings are consistent with the clinical history of multiple myeloma. No large free air on this single supine radiograph. No abnormal calcifications. IMPRESSION: 1. Unremarkable bowel gas pattern. 2. Known multiple myeloma with large lytic lesion replacing the majority of the right iliac wing. Electronically Signed   By: Jacqulynn Cadet M.D.   On: 08/24/2015 16:44    Microbiology: Recent Results (from the past 240 hour(s))  MRSA PCR Screening     Status: None   Collection Time: 09/07/15  8:17 PM  Result Value Ref Range Status   MRSA by PCR NEGATIVE NEGATIVE Final    Comment:        The GeneXpert MRSA Assay (FDA approved for NASAL specimens only), is one component of a comprehensive MRSA colonization surveillance program. It  is not intended to diagnose MRSA infection nor to guide or monitor treatment for MRSA infections.      Labs: Basic Metabolic Panel:  Recent Labs Lab 09/07/15 1610 09/08/15 0818 09/08/15 1115 09/09/15 0500 09/10/15 0200 09/11/15 0433  NA 146* 147*  --  142 137 136  K 3.5 3.1*  --  3.3* 4.5 3.8  CL 113* 121*  --  114* 110 110  CO2 25 23  --  22 21* 22  GLUCOSE 128* 87  --  90 90 74  BUN 30* 25*  --  $R'20 17 15  'db$ CREATININE 0.89 0.69  --  0.68 0.61 0.70  CALCIUM 9.1 8.3*  --  8.4* 8.5* 8.8*  MG  --   --  1.7 2.1  --   --    Liver Function Tests:  Recent Labs Lab 09/05/15 0904 09/07/15 1610  AST 14 20  ALT 10 16  ALKPHOS 61 57  BILITOT 0.96 0.9  PROT 8.6* 8.3*  ALBUMIN 2.2* 2.6*   No results for input(s): LIPASE, AMYLASE in the last 168 hours. No results for input(s): AMMONIA in the last 168 hours. CBC:  Recent Labs Lab 09/05/15 0903 09/07/15 1610 09/08/15 0818 09/09/15 0500 09/10/15 0200 09/11/15 0433  WBC 2.7* 5.5 3.4* 4.2 3.2* 3.6*  NEUTROABS 2.1 4.6  --   --   --   --   HGB 8.5* 8.2* 6.8* 8.5* 9.2* 9.2*  HCT 27.5* 26.3* 21.9* 27.3* 29.6* 29.4*  MCV 96.5 96.0 96.1 93.5 94.3 92.5  PLT 298 279 217 210 231 219   Cardiac Enzymes: No results for input(s): CKTOTAL, CKMB, CKMBINDEX, TROPONINI in the last 168 hours. BNP: BNP (last 3 results)  Recent Labs  09/09/15 2115  BNP 345.6*    ProBNP (last 3 results) No results for input(s): PROBNP in the last 8760 hours.  CBG: No results for input(s): GLUCAP in the last 168 hours.     Signed:  Undray Allman [facility] Emmery Seiler  [facility] 09/11/2015, 11:24 AM

## 2015-09-11 NOTE — Progress Notes (Signed)
Patient for d/c today to SNF bed at [facility]- Daughter and patient agreeable to this plan- plan transfer via EMS. Eduard Clos, MSW, [place]

## 2015-09-12 ENCOUNTER — Encounter: Payer: Self-pay | Admitting: Nutrition

## 2015-09-12 ENCOUNTER — Ambulatory Visit: Payer: Self-pay

## 2015-09-12 ENCOUNTER — Other Ambulatory Visit: Payer: Self-pay

## 2015-09-12 LAB — TYPE AND SCREEN
ABO/RH(D): O POS
ANTIBODY SCREEN: POSITIVE
DAT, IgG: NEGATIVE
UNIT DIVISION: 0
Unit division: 0

## 2015-09-19 ENCOUNTER — Other Ambulatory Visit: Payer: Self-pay | Admitting: Hematology and Oncology

## 2015-09-19 ENCOUNTER — Other Ambulatory Visit: Payer: Self-pay

## 2015-09-19 ENCOUNTER — Ambulatory Visit: Payer: Self-pay

## 2015-09-20 ENCOUNTER — Other Ambulatory Visit: Payer: Self-pay | Admitting: Hematology and Oncology

## 2015-09-20 ENCOUNTER — Telehealth: Payer: Self-pay | Admitting: *Deleted

## 2015-09-20 NOTE — Telephone Encounter (Signed)
Daughter left VM requesting pt scheduled for IVFs next week.  OK w/ Dr. Alvy Bimler,  She placed orders under Supportive Therapy.  NO need for labs,  Supportive care only.  Checked w/ Sharyn Lull in Infusion and she says available times tomorrow at 8 am or 2:15 pm otherwise some availability next week on Wed or Friday.   Called Daughter back and left VM w/ above information.  Asked her to call nurse back to let us know when she wants to bring pt for IVFs.

## 2015-09-21 ENCOUNTER — Telehealth: Payer: Self-pay | Admitting: *Deleted

## 2015-09-21 NOTE — Telephone Encounter (Signed)
Daughter left message stating she was returning call about scheduling IVF for next week. LM for daughter to call us back with best day to bring her in Wednesday or Friday

## 2015-09-21 NOTE — Telephone Encounter (Signed)
Notified of appt for IVF on 12/28 and 12/30 @ 1445

## 2015-09-26 ENCOUNTER — Other Ambulatory Visit: Payer: Self-pay

## 2015-09-26 ENCOUNTER — Ambulatory Visit: Payer: Self-pay

## 2015-09-26 ENCOUNTER — Ambulatory Visit (HOSPITAL_BASED_OUTPATIENT_CLINIC_OR_DEPARTMENT_OTHER): Payer: Medicare Other

## 2015-09-26 VITALS — BP 169/72 | HR 83 | Temp 98.6°F | Resp 18

## 2015-09-26 DIAGNOSIS — E860 Dehydration: Secondary | ICD-10-CM

## 2015-09-26 MED ORDER — HEPARIN SOD (PORK) LOCK FLUSH 100 UNIT/ML IV SOLN
500.0000 [IU] | Freq: Once | INTRAVENOUS | Status: AC | PRN
  Administered 2015-09-26: 500 [IU]
  Filled 2015-09-26: qty 5

## 2015-09-26 MED ORDER — SODIUM CHLORIDE 0.9 % IV SOLN
Freq: Once | INTRAVENOUS | Status: AC
  Administered 2015-09-26: 15:00:00 via INTRAVENOUS

## 2015-09-26 MED ORDER — SODIUM CHLORIDE 0.9 % IJ SOLN
10.0000 mL | INTRAMUSCULAR | Status: DC | PRN
  Administered 2015-09-26: 10 mL
  Filled 2015-09-26: qty 10

## 2015-09-26 NOTE — Patient Instructions (Signed)

## 2015-09-26 NOTE — Progress Notes (Signed)
Pt states she does not need IV zofran at this time. Will let RN know if she changes her mind while she is getting IVF today.

## 2015-09-27 ENCOUNTER — Telehealth: Payer: Self-pay | Admitting: *Deleted

## 2015-09-27 NOTE — Telephone Encounter (Signed)
Victoria [name] called to "cancel tomorrow's appointment and re schedule for next week.  I do not think she needs to come in tomorrow.  Call me at [phone]."

## 2015-09-28 ENCOUNTER — Telehealth: Payer: Self-pay | Admitting: Hematology and Oncology

## 2015-09-28 ENCOUNTER — Ambulatory Visit: Payer: Self-pay

## 2015-09-28 ENCOUNTER — Other Ambulatory Visit: Payer: Self-pay | Admitting: *Deleted

## 2015-09-28 NOTE — Telephone Encounter (Signed)
Called and left a message with added ivf appointments per pof

## 2015-10-02 ENCOUNTER — Ambulatory Visit: Payer: Medicare Other | Admitting: Licensed Clinical Social Worker

## 2015-10-05 ENCOUNTER — Ambulatory Visit: Payer: Self-pay | Admitting: Internal Medicine

## 2015-10-05 ENCOUNTER — Ambulatory Visit: Payer: Self-pay

## 2015-10-05 ENCOUNTER — Telehealth: Payer: Self-pay | Admitting: *Deleted

## 2015-10-05 NOTE — Telephone Encounter (Signed)
Moved appt from today to Monday. Daughter aware

## 2015-10-08 ENCOUNTER — Ambulatory Visit: Payer: Self-pay

## 2015-10-09 ENCOUNTER — Ambulatory Visit (HOSPITAL_BASED_OUTPATIENT_CLINIC_OR_DEPARTMENT_OTHER): Payer: Medicare Other

## 2015-10-09 VITALS — BP 125/61 | HR 71 | Temp 98.0°F | Resp 16

## 2015-10-09 DIAGNOSIS — C9002 Multiple myeloma in relapse: Secondary | ICD-10-CM

## 2015-10-09 DIAGNOSIS — E860 Dehydration: Secondary | ICD-10-CM

## 2015-10-09 MED ORDER — HEPARIN SOD (PORK) LOCK FLUSH 100 UNIT/ML IV SOLN
500.0000 [IU] | Freq: Once | INTRAVENOUS | Status: AC | PRN
  Administered 2015-10-09: 500 [IU]
  Filled 2015-10-09: qty 5

## 2015-10-09 MED ORDER — SODIUM CHLORIDE 0.9 % IJ SOLN
10.0000 mL | INTRAMUSCULAR | Status: DC | PRN
  Administered 2015-10-09: 10 mL
  Filled 2015-10-09: qty 10

## 2015-10-09 MED ORDER — ACETAMINOPHEN 325 MG PO TABS
ORAL_TABLET | ORAL | Status: AC
  Filled 2015-10-09: qty 2

## 2015-10-09 MED ORDER — SODIUM CHLORIDE 0.9 % IV SOLN
Freq: Once | INTRAVENOUS | Status: AC
  Administered 2015-10-09: 15:00:00 via INTRAVENOUS

## 2015-10-09 MED ORDER — ACETAMINOPHEN 325 MG PO TABS
650.0000 mg | ORAL_TABLET | Freq: Once | ORAL | Status: AC
Start: 2015-10-09 — End: 2015-10-09
  Administered 2015-10-09: 650 mg via ORAL

## 2015-10-09 NOTE — Patient Instructions (Signed)

## 2015-10-11 ENCOUNTER — Telehealth: Payer: Self-pay | Admitting: *Deleted

## 2015-10-11 NOTE — Telephone Encounter (Signed)
Received message from Shaune Spittle [facility] ) re:  Daughter would like to have referral to either [facility] or [facility] for pt. [name]   Phone     [phone].

## 2015-10-11 NOTE — Telephone Encounter (Signed)
Received call from [place], [place] @ [facility] re:  Pt has been discharged home from rehab center.  Ebony Hail would like to request verbal order from Dr. Alvy Bimler to resume home PT for pt -  Twice per week  For  4  Weeks  -  For strengthening and conditioning, and transferring to help ease burden on the caregiver. [name]    Phone      [phone].

## 2015-10-11 NOTE — Telephone Encounter (Signed)
Daughter requests Hospice Referral.   Called [facility] and made referral for Hospice.   Dr. Alvy Bimler to be Attending and requests Hospice MD to provide symptom management orders.

## 2015-10-12 ENCOUNTER — Telehealth: Payer: Self-pay

## 2015-10-12 ENCOUNTER — Telehealth: Payer: Self-pay | Admitting: *Deleted

## 2015-10-12 NOTE — Telephone Encounter (Signed)
We are referring patient to hospice I do not think PT would be appropriate

## 2015-10-12 NOTE — Telephone Encounter (Signed)
Patients daughter called Ebony Hail) she stated that her mother was prescribed Seroquel while in the hospital. She is now back at her facility where she stays and she is out of this medication. Daughter was wanting to know if Dr. Alain Marion can refill this medication so that the patient does not go through withdrawals. Medication would need to be sent to [facility] please advise.

## 2015-10-12 NOTE — Telephone Encounter (Signed)
I'm OK to renew it unless the facility has it's own supervising doctor. If it has a doctor - this request should go to the facility doctor. Thx

## 2015-10-12 NOTE — Telephone Encounter (Signed)
Called [place], [facility] w/ [facility] and informed that pt has been referred to Hospice. No need for PHT referral at this time.  We will call them back if anything changes.

## 2015-10-15 ENCOUNTER — Telehealth: Payer: Self-pay | Admitting: *Deleted

## 2015-10-15 NOTE — Telephone Encounter (Signed)
Call from [place], Victoria [name], states she saw pt earlier today.  Pt was was doing ok w/o any respiratory distress at time of visit.  Pt's daughter called her later today to report pt having "labored breathing"  "shortness of breath" and "congestion."   She asks if Dr. Alvy Bimler ok to order Albuterol Nebulizer?   Informed ok per Dr. Alvy Bimler for Albuterol Nebulizer.   Also informed her to please request symptom management orders from Hospice Physician if possible.  She agreed.

## 2015-10-17 ENCOUNTER — Ambulatory Visit (INDEPENDENT_AMBULATORY_CARE_PROVIDER_SITE_OTHER): Payer: Medicare Other | Admitting: Podiatry

## 2015-10-17 ENCOUNTER — Encounter: Payer: Self-pay | Admitting: Podiatry

## 2015-10-17 DIAGNOSIS — B351 Tinea unguium: Secondary | ICD-10-CM | POA: Diagnosis not present

## 2015-10-17 DIAGNOSIS — M79676 Pain in unspecified toe(s): Secondary | ICD-10-CM | POA: Diagnosis not present

## 2015-10-17 NOTE — Progress Notes (Signed)
Patient ID: Victoria [name], female   DOB: [birth year], 82 y.o.   MRN: [id number] Complaint:  Visit Type: Patient returns to my office for continued preventative foot care services. Complaint: Patient states" my nails have grown long and thick and become painful to walk and wear shoes" . The patient presents for preventative foot care services. No changes to ROS  Podiatric Exam: Vascular: dorsalis pedis and posterior tibial pulses are palpable bilateral. Capillary return is immediate. Temperature gradient is WNL. Skin turgor WNL  Sensorium: Normal Semmes Weinstein monofilament test. Normal tactile sensation bilaterally. Nail Exam: Pt has thick disfigured discolored nails with subungual debris noted bilateral entire nail hallux through fifth toenails Ulcer Exam: There is no evidence of ulcer or pre-ulcerative changes or infection. Orthopedic Exam: Muscle tone and strength are WNL. No limitations in general ROM. No crepitus or effusions noted. Foot type and digits show no abnormalities. Bony prominences are unremarkable. Skin: No Porokeratosis. No infection or ulcers  Diagnosis:  Onychomycosis, , Pain in right toe, pain in left toes  Treatment & Plan Procedures and Treatment: Consent by patient was obtained for treatment procedures. The patient understood the discussion of treatment and procedures well. All questions were answered thoroughly reviewed. Debridement of mycotic and hypertrophic toenails, 1 through 5 bilateral and clearing of subungual debris. No ulceration, no infection noted.  Return Visit-Office Procedure: Patient instructed to return to the office for a follow up visit 3 months for continued evaluation and treatment.   Gardiner Barefoot DPM

## 2015-10-22 ENCOUNTER — Ambulatory Visit (INDEPENDENT_AMBULATORY_CARE_PROVIDER_SITE_OTHER): Payer: Medicare Other | Admitting: Internal Medicine

## 2015-10-22 ENCOUNTER — Encounter: Payer: Self-pay | Admitting: Internal Medicine

## 2015-10-22 VITALS — BP 160/80 | HR 92 | Wt 86.0 lb

## 2015-10-22 DIAGNOSIS — R531 Weakness: Secondary | ICD-10-CM

## 2015-10-22 DIAGNOSIS — F062 Psychotic disorder with delusions due to known physiological condition: Secondary | ICD-10-CM

## 2015-10-22 DIAGNOSIS — C9002 Multiple myeloma in relapse: Secondary | ICD-10-CM

## 2015-10-22 DIAGNOSIS — R279 Unspecified lack of coordination: Secondary | ICD-10-CM

## 2015-10-22 DIAGNOSIS — E46 Unspecified protein-calorie malnutrition: Secondary | ICD-10-CM

## 2015-10-22 DIAGNOSIS — F411 Generalized anxiety disorder: Secondary | ICD-10-CM

## 2015-10-22 MED ORDER — MEGESTROL ACETATE 40 MG PO TABS: 40.0000 mg | ORAL_TABLET | Freq: Two times a day (BID) | ORAL | Status: AC

## 2015-10-22 MED ORDER — LORAZEPAM 1 MG PO TABS
1.0000 mg | ORAL_TABLET | Freq: Two times a day (BID) | ORAL | Status: AC | PRN
Start: 2015-10-22 — End: ?

## 2015-10-22 MED ORDER — TRIAMCINOLONE ACETONIDE 0.025 % EX OINT: 1.0000 "application " | TOPICAL_OINTMENT | Freq: Two times a day (BID) | CUTANEOUS | Status: AC

## 2015-10-22 MED ORDER — QUETIAPINE FUMARATE 50 MG PO TABS: 50.0000 mg | ORAL_TABLET | Freq: Every day | ORAL | Status: DC

## 2015-10-22 NOTE — Assessment & Plan Note (Signed)
Home PT

## 2015-10-22 NOTE — Progress Notes (Signed)
Subjective:  Patient ID: Victoria [name], female    DOB: 09/08/1932  Age: 82 y.o. MRN: [id number]  CC: No chief complaint on file.   HPI Victoria [name] presents for anxiety, ataxia, wt loss, MM f/up.   Outpatient Prescriptions Prior to Visit  Medication Sig Dispense Refill  . acetaminophen (TYLENOL) 325 MG tablet Take 2 tablets (650 mg total) by mouth every 6 (six) hours as needed for mild pain (or Fever >/= 101).    Marland Kitchen azithromycin (ZITHROMAX) 250 MG tablet Daily through 12/14 6 each 0  . cholecalciferol (VITAMIN D) 1000 UNITS tablet Take 2,000 Units by mouth daily.    . feeding supplement, ENSURE ENLIVE, (ENSURE ENLIVE) LIQD Take 237 mLs by mouth 2 (two) times daily between meals. 237 mL 12  . guaiFENesin (MUCINEX) 600 MG 12 hr tablet Take 1 tablet (600 mg total) by mouth 2 (two) times daily.    Marland Kitchen guaiFENesin (ROBITUSSIN) 100 MG/5ML SOLN Take 20 mLs (400 mg total) by mouth every 6 (six) hours as needed for cough or to loosen phlegm. 1200 mL 0  . ipratropium-albuterol (DUONEB) 0.5-2.5 (3) MG/3ML SOLN Take 3 mLs by nebulization every 6 (six) hours as needed. 360 mL   . LORazepam (ATIVAN) 0.5 MG tablet Take 1 tablet (0.5 mg total) by mouth every 8 (eight) hours as needed. for anxiety 10 tablet 0  . megestrol (MEGACE) 40 MG tablet Take 1 tablet (40 mg total) by mouth 2 (two) times daily. 28 tablet 0  . Multiple Vitamins-Minerals (MULTIVITAMIN & MINERAL PO) Take 1 tablet by mouth daily.    . pantoprazole (PROTONIX) 40 MG tablet Take 1 tablet (40 mg total) by mouth daily.    . QUEtiapine (SEROQUEL) 50 MG tablet Take 1 tablet (50 mg total) by mouth at bedtime.     Facility-Administered Medications Prior to Visit  Medication Dose Route Frequency Provider Last Rate Last Dose  . sodium chloride 0.9 % injection 10 mL  10 mL Intravenous PRN Ni Gorsuch, MD   10 mL at 08/01/15 1405    ROS Review of Systems  Constitutional: Negative for chills, activity change, appetite change, fatigue and  unexpected weight change.  HENT: Negative for congestion, mouth sores and sinus pressure.   Eyes: Negative for visual disturbance.  Respiratory: Negative for cough and chest tightness.   Gastrointestinal: Negative for nausea and abdominal pain.  Genitourinary: Negative for frequency, difficulty urinating and vaginal pain.  Musculoskeletal: Positive for arthralgias and gait problem. Negative for back pain and joint swelling.  Skin: Negative for pallor and rash.  Neurological: Negative for dizziness, tremors, weakness, numbness and headaches.  Psychiatric/Behavioral: Positive for confusion and decreased concentration. Negative for suicidal ideas, sleep disturbance and agitation. The patient is nervous/anxious.     Objective:  BP 160/80 mmHg  Pulse 92  Wt 86 lb (39.009 kg)  SpO2 96%  BP Readings from Last 3 Encounters:  10/22/15 160/80  10/09/15 125/61  09/26/15 169/72    Wt Readings from Last 3 Encounters:  10/22/15 86 lb (39.009 kg)  09/08/15 83 lb 1.8 oz (37.7 kg)  09/06/15 89 lb (40.37 kg)    Physical Exam  Constitutional: She appears well-developed. No distress.  HENT:  Head: Normocephalic.  Right Ear: External ear normal.  Left Ear: External ear normal.  Nose: Nose normal.  Mouth/Throat: Oropharynx is clear and moist.  Eyes: Conjunctivae are normal. Pupils are equal, round, and reactive to light. Right eye exhibits no discharge. Left eye exhibits no discharge.  Neck: Normal range of motion. Neck supple. No JVD present. No tracheal deviation present. No thyromegaly present.  Cardiovascular: Normal rate, regular rhythm and normal heart sounds.   Pulmonary/Chest: No stridor. No respiratory distress. She has no wheezes.  Abdominal: Soft. Bowel sounds are normal. She exhibits no distension and no mass. There is no tenderness. There is no rebound and no guarding.  Musculoskeletal: She exhibits tenderness. She exhibits no edema.  Lymphadenopathy:    She has no cervical  adenopathy.  Neurological: She displays normal reflexes. No cranial nerve deficit. She exhibits normal muscle tone. Coordination abnormal.  Skin: No rash noted. No erythema.  cracked lips In a w/c Cranky - "I want to go home!"  Lab Results  Component Value Date   WBC 3.6* 09/11/2015   HGB 9.2* 09/11/2015   HCT 29.4* 09/11/2015   PLT 219 09/11/2015   GLUCOSE 74 09/11/2015   CHOL 158 03/09/2007   TRIG 37 03/09/2007   HDL 58.8 03/09/2007   LDLCALC 92 03/09/2007   ALT 16 09/07/2015   AST 20 09/07/2015   NA 136 09/11/2015   K 3.8 09/11/2015   CL 110 09/11/2015   CREATININE 0.70 09/11/2015   BUN 15 09/11/2015   CO2 22 09/11/2015   TSH 1.640 08/28/2015   INR 1.08 07/16/2015   HGBA1C 5.3 03/09/2007    Dg Chest 2 View  09/07/2015  CLINICAL DATA:  Dry cough for 2-3 months. EXAM: CHEST  2 VIEW COMPARISON:  08/24/2015 FINDINGS: There are low lung volumes. There is bilateral interstitial thickening. There is no focal consolidation. There is no pleural effusion or pneumothorax. There is stable cardiomegaly. There is thoracic aortic atherosclerosis. There is a right-sided Port-A-Cath in satisfactory position. There is a healing right posterior sixth rib fracture. IMPRESSION: Stable cardiomegaly with mild pulmonary vascular congestion likely superimposed upon chronic interstitial disease. Electronically Signed   By: Kathreen Devoid   On: 09/07/2015 16:00   Dg Chest Port 1 View  09/08/2015  CLINICAL DATA:  Short of breath and cough for 5 days EXAM: PORTABLE CHEST 1 VIEW COMPARISON:  Radiograph 09/06/2005 FINDINGS: Left-sided port overlies stable cardiac silhouette. Low lung volumes. There is chronic interstitial thickening. No focal consolidation. Remote RIGHT rib fracture. IMPRESSION: Low lung volumes and chronic interstitial lung disease. Electronically Signed   By: Suzy Bouchard M.D.   On: 09/08/2015 10:49    Assessment & Plan:   Diagnoses and all orders for this visit:  Multiple myeloma  in relapse ([place]) -     megestrol (MEGACE) 40 MG tablet; Take 1 tablet (40 mg total) by mouth 2 (two) times daily.  Protein malnutrition ([facility]) -     megestrol (MEGACE) 40 MG tablet; Take 1 tablet (40 mg total) by mouth 2 (two) times daily.  Other orders -     QUEtiapine (SEROQUEL) 50 MG tablet; Take 1 tablet (50 mg total) by mouth at bedtime.   I am having Victoria [name] maintain her Multiple Vitamins-Minerals (MULTIVITAMIN & MINERAL PO), cholecalciferol, acetaminophen, megestrol, guaiFENesin, azithromycin, feeding supplement (ENSURE ENLIVE), guaiFENesin, ipratropium-albuterol, QUEtiapine, pantoprazole, LORazepam, and LORazepam.  Meds ordered this encounter  Medications  . LORazepam (ATIVAN) 1 MG tablet    Sig: Take 1 tablet by mouth at bedtime.     Follow-up: No Follow-up on file.  Walker Kehr, MD

## 2015-10-22 NOTE — Progress Notes (Signed)
Pre visit review using our clinic review tool, if applicable. No additional management support is needed unless otherwise documented below in the visit note. 

## 2015-10-22 NOTE — Assessment & Plan Note (Signed)
PT at home - [place]

## 2015-10-22 NOTE — Assessment & Plan Note (Signed)
Megace po

## 2015-10-22 NOTE — Assessment & Plan Note (Signed)
Progressing general dementia issues

## 2015-10-26 ENCOUNTER — Telehealth: Payer: Self-pay | Admitting: *Deleted

## 2015-10-26 NOTE — Telephone Encounter (Signed)
Received call from ptdaughter she states fail to ask if mom need to take B12 injections if so would like rx sent to [facility].../lmb

## 2015-10-27 NOTE — Telephone Encounter (Signed)
Her last Vit B12 was nl Was she B12 defficient? Thx

## 2015-10-29 NOTE — Telephone Encounter (Signed)
Called pt daughter Ebony Hail) no answer LMOM RTC...Johny Chess

## 2015-10-31 ENCOUNTER — Ambulatory Visit (HOSPITAL_COMMUNITY): Payer: Self-pay | Admitting: Psychiatry

## 2015-10-31 NOTE — Telephone Encounter (Signed)
Called daughter again still no answer LMOM with md response...Johny Chess

## 2015-11-01 ENCOUNTER — Other Ambulatory Visit (INDEPENDENT_AMBULATORY_CARE_PROVIDER_SITE_OTHER)

## 2015-11-01 ENCOUNTER — Telehealth: Payer: Self-pay | Admitting: *Deleted

## 2015-11-01 DIAGNOSIS — N3000 Acute cystitis without hematuria: Secondary | ICD-10-CM | POA: Diagnosis not present

## 2015-11-01 LAB — URINALYSIS, ROUTINE W REFLEX MICROSCOPIC
BILIRUBIN URINE: NEGATIVE
HGB URINE DIPSTICK: NEGATIVE
Ketones, ur: NEGATIVE
LEUKOCYTES UA: NEGATIVE
NITRITE: NEGATIVE
Specific Gravity, Urine: 1.025 (ref 1.000–1.030)
TOTAL PROTEIN, URINE-UPE24: 100 — AB
Urine Glucose: NEGATIVE
Urobilinogen, UA: 0.2 (ref 0.0–1.0)
pH: 7.5 (ref 5.0–8.0)

## 2015-11-01 NOTE — Telephone Encounter (Signed)
Daughter called stating mom is having UTI sxs. Wanting md to ok to have urine check.Called daughter back md ok UA order has been placed....Johny Chess

## 2015-11-16 ENCOUNTER — Other Ambulatory Visit: Payer: Self-pay | Admitting: Hematology and Oncology

## 2015-11-28 ENCOUNTER — Ambulatory Visit: Payer: Medicare Other | Admitting: Family Medicine

## 2015-11-28 ENCOUNTER — Telehealth: Payer: Self-pay | Admitting: Family Medicine

## 2015-11-29 NOTE — Telephone Encounter (Signed)
Pt was no show 11/28/15 4:30pm for acute appt, pt has not rescheduled, charge or no charge?

## 2015-11-29 NOTE — Telephone Encounter (Signed)
-----   Message from Darreld Mclean, MD sent at 11/29/2015 11:43 AM EST ----- No charge- I spoke to her daughter

## 2015-12-04 ENCOUNTER — Telehealth: Payer: Self-pay

## 2015-12-04 NOTE — Telephone Encounter (Signed)
Patient's daughter called today stating that patient has had diarrhea for 4 days.  Msg routed to Dr. Calton Dach nurse.

## 2015-12-19 ENCOUNTER — Other Ambulatory Visit: Payer: Self-pay | Admitting: *Deleted

## 2015-12-19 ENCOUNTER — Telehealth: Payer: Self-pay | Admitting: *Deleted

## 2015-12-19 NOTE — Telephone Encounter (Signed)
Hospice nurse, Stanton Kidney called to say they ran a UA and culture on patient. Culture is pending, but will start antibiotics. Dr Alvy Bimler is fine with that.

## 2015-12-28 MED FILL — *NINLARO 4 MG CAPSULE: 4 | 28 days supply | Qty: 3 | Fill #6

## 2016-02-01 ENCOUNTER — Telehealth: Payer: Self-pay | Admitting: *Deleted

## 2016-02-01 ENCOUNTER — Encounter (HOSPITAL_COMMUNITY): Payer: Self-pay | Admitting: *Deleted

## 2016-02-01 ENCOUNTER — Emergency Department (HOSPITAL_COMMUNITY)

## 2016-02-01 ENCOUNTER — Inpatient Hospital Stay (HOSPITAL_COMMUNITY)
Admission: EM | Admit: 2016-02-01 | Discharge: 2016-02-28 | DRG: 296 | Disposition: E | Attending: Internal Medicine | Admitting: Internal Medicine

## 2016-02-01 DIAGNOSIS — Z515 Encounter for palliative care: Secondary | ICD-10-CM | POA: Diagnosis not present

## 2016-02-01 DIAGNOSIS — J9601 Acute respiratory failure with hypoxia: Secondary | ICD-10-CM | POA: Diagnosis not present

## 2016-02-01 DIAGNOSIS — I129 Hypertensive chronic kidney disease with stage 1 through stage 4 chronic kidney disease, or unspecified chronic kidney disease: Secondary | ICD-10-CM | POA: Diagnosis present

## 2016-02-01 DIAGNOSIS — C9001 Multiple myeloma in remission: Secondary | ICD-10-CM | POA: Diagnosis present

## 2016-02-01 DIAGNOSIS — D638 Anemia in other chronic diseases classified elsewhere: Secondary | ICD-10-CM | POA: Diagnosis present

## 2016-02-01 DIAGNOSIS — G4700 Insomnia, unspecified: Secondary | ICD-10-CM | POA: Diagnosis present

## 2016-02-01 DIAGNOSIS — I469 Cardiac arrest, cause unspecified: Principal | ICD-10-CM | POA: Diagnosis present

## 2016-02-01 DIAGNOSIS — Z681 Body mass index (BMI) 19 or less, adult: Secondary | ICD-10-CM | POA: Diagnosis not present

## 2016-02-01 DIAGNOSIS — F0390 Unspecified dementia without behavioral disturbance: Secondary | ICD-10-CM | POA: Diagnosis present

## 2016-02-01 DIAGNOSIS — L8990 Pressure ulcer of unspecified site, unspecified stage: Secondary | ICD-10-CM | POA: Insufficient documentation

## 2016-02-01 DIAGNOSIS — E870 Hyperosmolality and hypernatremia: Secondary | ICD-10-CM | POA: Diagnosis present

## 2016-02-01 DIAGNOSIS — F419 Anxiety disorder, unspecified: Secondary | ICD-10-CM | POA: Diagnosis present

## 2016-02-01 DIAGNOSIS — R627 Adult failure to thrive: Secondary | ICD-10-CM | POA: Diagnosis present

## 2016-02-01 DIAGNOSIS — N189 Chronic kidney disease, unspecified: Secondary | ICD-10-CM | POA: Diagnosis present

## 2016-02-01 DIAGNOSIS — E872 Acidosis: Secondary | ICD-10-CM | POA: Diagnosis present

## 2016-02-01 DIAGNOSIS — E875 Hyperkalemia: Secondary | ICD-10-CM | POA: Diagnosis present

## 2016-02-01 DIAGNOSIS — Z79899 Other long term (current) drug therapy: Secondary | ICD-10-CM

## 2016-02-01 DIAGNOSIS — E162 Hypoglycemia, unspecified: Secondary | ICD-10-CM | POA: Diagnosis present

## 2016-02-01 DIAGNOSIS — Z8249 Family history of ischemic heart disease and other diseases of the circulatory system: Secondary | ICD-10-CM

## 2016-02-01 DIAGNOSIS — Z881 Allergy status to other antibiotic agents status: Secondary | ICD-10-CM

## 2016-02-01 DIAGNOSIS — J690 Pneumonitis due to inhalation of food and vomit: Secondary | ICD-10-CM | POA: Diagnosis present

## 2016-02-01 DIAGNOSIS — R64 Cachexia: Secondary | ICD-10-CM | POA: Diagnosis present

## 2016-02-01 DIAGNOSIS — Z66 Do not resuscitate: Secondary | ICD-10-CM | POA: Diagnosis not present

## 2016-02-01 DIAGNOSIS — J9600 Acute respiratory failure, unspecified whether with hypoxia or hypercapnia: Secondary | ICD-10-CM | POA: Diagnosis not present

## 2016-02-01 DIAGNOSIS — N179 Acute kidney failure, unspecified: Secondary | ICD-10-CM | POA: Diagnosis present

## 2016-02-01 DIAGNOSIS — Z22322 Carrier or suspected carrier of Methicillin resistant Staphylococcus aureus: Secondary | ICD-10-CM

## 2016-02-01 DIAGNOSIS — Z91040 Latex allergy status: Secondary | ICD-10-CM

## 2016-02-01 DIAGNOSIS — Z886 Allergy status to analgesic agent status: Secondary | ICD-10-CM

## 2016-02-01 DIAGNOSIS — Z888 Allergy status to other drugs, medicaments and biological substances status: Secondary | ICD-10-CM

## 2016-02-01 DIAGNOSIS — Z993 Dependence on wheelchair: Secondary | ICD-10-CM

## 2016-02-01 LAB — I-STAT ARTERIAL BLOOD GAS, ED
Acid-Base Excess: 2 mmol/L (ref 0.0–2.0)
Bicarbonate: 27.1 meq/L — ABNORMAL HIGH (ref 20.0–24.0)
O2 Saturation: 100 %
Patient temperature: 98.6
TCO2: 28 mmol/L (ref 0–100)
pCO2 arterial: 41.4 mmHg (ref 35.0–45.0)
pH, Arterial: 7.424 (ref 7.350–7.450)
pO2, Arterial: 371 mmHg — ABNORMAL HIGH (ref 80.0–100.0)

## 2016-02-01 LAB — CBC
HCT: 26.7 % — ABNORMAL LOW (ref 36.0–46.0)
HEMOGLOBIN: 7.5 g/dL — AB (ref 12.0–15.0)
MCH: 27.8 pg (ref 26.0–34.0)
MCHC: 28.1 g/dL — AB (ref 30.0–36.0)
MCV: 98.9 fL (ref 78.0–100.0)
Platelets: 244 10*3/uL (ref 150–400)
RBC: 2.7 MIL/uL — ABNORMAL LOW (ref 3.87–5.11)
RDW: 20.9 % — ABNORMAL HIGH (ref 11.5–15.5)
WBC: 7.3 10*3/uL (ref 4.0–10.5)

## 2016-02-01 LAB — URINALYSIS, ROUTINE W REFLEX MICROSCOPIC
Bilirubin Urine: NEGATIVE
Glucose, UA: NEGATIVE mg/dL
KETONES UR: NEGATIVE mg/dL
LEUKOCYTES UA: NEGATIVE
NITRITE: NEGATIVE
Protein, ur: 100 mg/dL — AB
SPECIFIC GRAVITY, URINE: 1.018 (ref 1.005–1.030)
pH: 6 (ref 5.0–8.0)

## 2016-02-01 LAB — PROTIME-INR
INR: 1.64 — ABNORMAL HIGH (ref 0.00–1.49)
PROTHROMBIN TIME: 19.5 s — AB (ref 11.6–15.2)

## 2016-02-01 LAB — URINE MICROSCOPIC-ADD ON

## 2016-02-01 LAB — BASIC METABOLIC PANEL
Anion gap: 14 (ref 5–15)
BUN: 56 mg/dL — AB (ref 6–20)
CHLORIDE: 115 mmol/L — AB (ref 101–111)
CO2: 22 mmol/L (ref 22–32)
Calcium: 12.1 mg/dL — ABNORMAL HIGH (ref 8.9–10.3)
Creatinine, Ser: 1.09 mg/dL — ABNORMAL HIGH (ref 0.44–1.00)
GFR calc non Af Amer: 46 mL/min — ABNORMAL LOW (ref >60)
GFR, EST AFRICAN AMERICAN: 53 mL/min — AB (ref >60)
GLUCOSE: 130 mg/dL — AB (ref 65–99)
Potassium: 4.1 mmol/L (ref 3.5–5.1)
SODIUM: 151 mmol/L — AB (ref 135–145)

## 2016-02-01 LAB — I-STAT TROPONIN, ED: Troponin i, poc: 0.09 ng/mL (ref 0.00–0.08)

## 2016-02-01 LAB — CBG MONITORING, ED: Glucose-Capillary: 131 mg/dL — ABNORMAL HIGH (ref 65–99)

## 2016-02-01 LAB — I-STAT CG4 LACTIC ACID, ED: LACTIC ACID, VENOUS: 7.93 mmol/L — AB (ref 0.5–2.0)

## 2016-02-01 LAB — APTT: APTT: 30 s (ref 24–37)

## 2016-02-01 MED ORDER — ANTISEPTIC ORAL RINSE SOLUTION (CORINZ)
7.0000 mL | Freq: Four times a day (QID) | OROMUCOSAL | Status: DC
  Administered 2016-02-02: 7 mL via OROMUCOSAL

## 2016-02-01 MED ORDER — SODIUM CHLORIDE 0.9% FLUSH
10.0000 mL | INTRAVENOUS | Status: DC | PRN
Start: 2016-02-01 — End: 2016-02-09

## 2016-02-01 MED ORDER — PANTOPRAZOLE SODIUM 40 MG IV SOLR
40.0000 mg | INTRAVENOUS | Status: DC
  Administered 2016-02-02 – 2016-02-06 (×5): 40 mg via INTRAVENOUS
  Filled 2016-02-01 (×5): qty 40

## 2016-02-01 MED ORDER — FENTANYL CITRATE (PF) 100 MCG/2ML IJ SOLN
50.0000 ug | INTRAMUSCULAR | Status: DC | PRN
  Administered 2016-02-02 – 2016-02-04 (×4): 50 ug via INTRAVENOUS
  Administered 2016-02-05 (×2): 25 ug via INTRAVENOUS
  Filled 2016-02-01 (×7): qty 2

## 2016-02-01 MED ORDER — MIDAZOLAM HCL 2 MG/2ML IJ SOLN
1.0000 mg | INTRAMUSCULAR | Status: DC | PRN
  Administered 2016-02-01: 1 mg via INTRAVENOUS
  Filled 2016-02-01: qty 2

## 2016-02-01 MED ORDER — THIAMINE HCL 100 MG/ML IJ SOLN
100.0000 mg | Freq: Every day | INTRAMUSCULAR | Status: DC
  Administered 2016-02-02 – 2016-02-05 (×5): 100 mg via INTRAVENOUS
  Filled 2016-02-01 (×5): qty 2

## 2016-02-01 MED ORDER — CHLORHEXIDINE GLUCONATE 0.12% ORAL RINSE (MEDLINE KIT)
15.0000 mL | Freq: Two times a day (BID) | OROMUCOSAL | Status: DC
  Administered 2016-02-02 – 2016-02-08 (×14): 15 mL via OROMUCOSAL

## 2016-02-01 MED ORDER — VITAMIN K1 10 MG/ML IJ SOLN
10.0000 mg | Freq: Once | INTRAVENOUS | Status: AC
  Administered 2016-02-02: 10 mg via INTRAVENOUS
  Filled 2016-02-01 (×2): qty 1

## 2016-02-01 MED ORDER — SODIUM CHLORIDE 0.9 % IV SOLN: 250.0000 mL | INTRAVENOUS | Status: DC | PRN

## 2016-02-01 MED ORDER — FENTANYL CITRATE (PF) 100 MCG/2ML IJ SOLN
50.0000 ug | INTRAMUSCULAR | Status: AC | PRN
  Administered 2016-02-01 – 2016-02-03 (×3): 50 ug via INTRAVENOUS
  Filled 2016-02-01 (×2): qty 2

## 2016-02-01 MED ORDER — MIDAZOLAM HCL 2 MG/2ML IJ SOLN
1.0000 mg | INTRAMUSCULAR | Status: DC | PRN
  Administered 2016-02-02: 1 mg via INTRAVENOUS
  Filled 2016-02-01: qty 2

## 2016-02-01 MED ORDER — HEPARIN SODIUM (PORCINE) 5000 UNIT/ML IJ SOLN
5000.0000 [IU] | Freq: Three times a day (TID) | INTRAMUSCULAR | Status: DC
  Administered 2016-02-02 – 2016-02-05 (×11): 5000 [IU] via SUBCUTANEOUS
  Filled 2016-02-01 (×10): qty 1

## 2016-02-01 NOTE — ED Notes (Signed)
Patient presents via EMS after called for resp arrest witnessed while at home.  Original call was at [employer].

## 2016-02-01 NOTE — ED Notes (Signed)
Patient was a witnessed resp arrest at home by the home health aide.  EMS called  and upon their arrival SB narrow complex on the monitor.  Then a wide complex for approx 5 mins then they were transferring her to the truck and she cardiac arrested.  2 rounds of CPR with 1 amp epi CBG 52 and amp D50.  1 min of CPR afterwards EMS reported CBG 173 BP 110/50.  IO in left lower leg removed by this nurse

## 2016-02-01 NOTE — Progress Notes (Signed)
Pharmacy Antibiotic Note  Victoria [name] is a 82 y.o. female who is hospice pt admitted on 02/15/2016 with cardiac arrest. Pharmacy has been consulted for Vancomycin and Cefepime dosing for sepsis. Pt with allergy to amoxicillin of hives/itching.  Plan: Cefepime 1 gm IV now then 500mg  IV q24h Vancomycin 500mg  IV q48h Will f/u micro data, renal function, and pt's clinical condition Vanc trough prn  Height: 5\' 1"  (154.9 cm) Weight: 71 lb 10.4 oz (32.5 kg) IBW/kg (Calculated) : 47.8  Temp (24hrs), Avg:96.4 F (35.8 C), Min:95.9 F (35.5 C), Max:97 F (36.1 C)   Recent Labs Lab 02/12/2016 1924 02/11/2016 1936  WBC 7.3  --   CREATININE 1.09*  --   LATICACIDVEN  --  7.93*    Estimated Creatinine Clearance: 20.4 mL/min (by C-G formula based on Cr of 1.09).    Allergies  Allergen Reactions  . Amoxicillin Hives and Itching    Has patient had a PCN reaction causing immediate rash, facial/tongue/throat swelling, SOB or lightheadedness with hypotension: No Has patient had a PCN reaction causing severe rash involving mucus membranes or skin necrosis: No Has patient had a PCN reaction that required hospitalization No Has patient had a PCN reaction occurring within the last 10 years: No If all of the above answers are "NO", then may proceed with Cephalosporin use.  . Aspirin Other (See Comments)    Child hood allergy   . Atenolol     Hair loss  . Donepezil Other (See Comments)    agitation   . Doxycycline     Altered mental status   . Nortriptyline     agitation  . Soap Other (See Comments)    [place] soap is better.  Needs a gentle cleanser that is hypoallergenic.  Baby products are OK  . Amiodarone Rash    IV is OK  . Latex Dermatitis  . Zoloft [Sertraline Hcl] Anxiety    Increased agitation    Antimicrobials this admission: 5/6 Vanc >>  5/6 Cefepime >>   Dose adjustments this admission: n/a  Microbiology results:  BCx:  5/5 MRSA PCR:  Thank you for allowing pharmacy  to be a part of this patient's care.  Sherlon Handing, PharmD, BCPS Clinical pharmacist, pager [phone] 02/20/2016 11:31 PM

## 2016-02-01 NOTE — H&P (Signed)
PULMONARY / CRITICAL CARE MEDICINE HISTORY AND PHYSICAL EXAMINATION   Name: Victoria [name] MRN: [id number] DOB: 1931-12-11    ADMISSION DATE:  02/21/2016  PRIMARY SERVICE: PCCM  CHIEF COMPLAINT:  Cardiac arrest  BRIEF PATIENT DESCRIPTION: 82 y/o woman with dementia, MM, FTT, on hospice presenting w/ respiratory -> Cardiac arrest 2/2 aspiration  SIGNIFICANT EVENTS / STUDIES:  - Bradycardia -> arrest in field, ROSC obtained after 6 mins, epi x1 - Intubated in ED  LINES / TUBES: - ETT 5/5 - OGT 5/5 - Portacath (Oct '16)  CULTURES: Blood 5/5 (pending) Urine 5/5 (in process)  ANTIBIOTICS: Vanc 5/5>> Zosyn  5/5 >>  HISTORY OF PRESENT ILLNESS:   Victoria [name] is a 82 y/o woman with advanced dementia, MM, and failure to thrive who presents to the ED via EMS after a cardiac arrest at home. The patient's daughter and caregiver report that over the past week her functional status has declined, and has remained bed ridden for the last day or so. Yesterday she was unable to take food by mouth, and when she tried to swallow soft foods, they didn't go down, and came out of her mouth (without a cough) after a minute or so. Today her family tried to give her liquids (broths, ensure, etc) but she didn't swallow much. She was minimally responsive to family. She made some gurgling sounds, and then later was found to be apnic. EMS was called, and the family member began rescue breathing. Upon arrival, the patient was in a wide complex rhythm, and then developed bradycardia and then PEA. CPR was begun, and after six minutes of CPR with epi x1, ROSC was obtained. She was taken to the ED and PCCM was called for admission.  PAST MEDICAL HISTORY :  Past Medical History  Diagnosis Date  . Hypertension   . Insomnia   . Anxiety   . Anemia   . Diverticulosis 04/2001  . Perforation bowel ([place])   . Ringing in ears     Wears hearing aides to drown out   . Rectal bleeding 02/03/2014  . Anemia in chronic illness  01/24/2015  . Multiple myeloma     In remission   Past Surgical History  Procedure Laterality Date  . Sigmoid resection / rectopexy  01/2011    perforation/stoma  . Colonoscopy    . Partial hysterectomy    . Cesarean section    . Colostomy takedown  06/27/11  . Cholecystectomy  2012    laparoscopic   Prior to Admission medications   Medication Sig Start Date End Date Taking? Authorizing Provider  cholecalciferol (VITAMIN D) 1000 UNITS tablet Take 2,000 Units by mouth daily.   Yes Historical Provider, MD  Control Gel Formula Dressing (DUODERM CGF BORDER DRESSING EX) Apply 1 application topically daily.   Yes Historical Provider, MD  LORazepam (ATIVAN) 1 MG tablet Take 1 tablet (1 mg total) by mouth 2 (two) times daily as needed for anxiety. Patient taking differently: Take 1 mg by mouth daily as needed for anxiety.  10/22/15  Yes Aleksei Plotnikov V, MD  megestrol (MEGACE) 40 MG tablet Take 1 tablet (40 mg total) by mouth 2 (two) times daily. 10/22/15  Yes Aleksei Plotnikov V, MD  Multiple Vitamins-Minerals (MULTIVITAMIN & MINERAL PO) Take 1 tablet by mouth daily.   Yes Historical Provider, MD  OVER THE COUNTER MEDICATION Take 1 Bottle by mouth 2 (two) times daily. [place] protein drink   Yes Historical Provider, MD  triamcinolone (KENALOG) 0.025 % ointment  Apply 1 application topically 2 (two) times daily. Use tid prn dry skin/lips 10/22/15  Yes Aleksei Plotnikov V, MD   Allergies  Allergen Reactions  . Amoxicillin Hives and Itching    Has patient had a PCN reaction causing immediate rash, facial/tongue/throat swelling, SOB or lightheadedness with hypotension: No Has patient had a PCN reaction causing severe rash involving mucus membranes or skin necrosis: No Has patient had a PCN reaction that required hospitalization No Has patient had a PCN reaction occurring within the last 10 years: No If all of the above answers are "NO", then may proceed with Cephalosporin use.  . Aspirin Other  (See Comments)    Child hood allergy   . Atenolol     Hair loss  . Donepezil Other (See Comments)    agitation   . Doxycycline     Altered mental status   . Nortriptyline     agitation  . Soap Other (See Comments)    [place] soap is better.  Needs a gentle cleanser that is hypoallergenic.  Baby products are OK  . Amiodarone Rash    IV is OK  . Latex Dermatitis  . Zoloft [Sertraline Hcl] Anxiety    Increased agitation    FAMILY HISTORY:  Family History  Problem Relation Age of Onset  . Hypertension Other   . Prostate cancer Father   . Colitis Neg Hx   . Esophageal cancer Neg Hx   . Stomach cancer Neg Hx    SOCIAL HISTORY:  reports that she has never smoked. She has never used smokeless tobacco. She reports that she does not drink alcohol or use illicit drugs.  REVIEW OF SYSTEMS:  Unable to obtain.  SUBJECTIVE:   VITAL SIGNS: Temp:  [95.9 F (35.5 C)-97 F (36.1 C)] 97 F (36.1 C) (05/05 2200) Pulse Rate:  [102-112] 102 (05/05 2200) Resp:  [16-24] 20 (05/05 2200) BP: (83-124)/(61-95) 96/71 mmHg (05/05 2200) SpO2:  [97 %-100 %] 98 % (05/05 2200) FiO2 (%):  [40 %] 40 % (05/05 2230) Weight:  [93 lb (42.185 kg)] 93 lb (42.185 kg) (05/05 2019) HEMODYNAMICS:   VENTILATOR SETTINGS: Vent Mode:  [-] PRVC FiO2 (%):  [40 %] 40 % Set Rate:  [14 bmp] 14 bmp Vt Set:  [450 mL] 450 mL PEEP:  [5 cmH20] 5 cmH20 Plateau Pressure:  [25 cmH20] 25 cmH20 INTAKE / OUTPUT: Intake/Output      05/05 0701 - 05/06 0700   I.V. (mL/kg) 1000 (23.7)   Total Intake(mL/kg) 1000 (23.7)   Urine (mL/kg/hr) 50   Total Output 50   Net +950         PHYSICAL EXAMINATION: General:  Extremely cachectic woman on ventilator, no response. Neuro:  Unresponsive, weak corneal reflexes, gag HEENT:  Dry MM Neck: No LAD Cardiovascular:  Heart sounds dual and normal. Lungs:  CTAB. Abdomen:  Soft, sacphoid Musculoskeletal:  Extremely reduced muscle bulk Skin:  No rashes  LABS:  CBC  Recent  Labs Lab 02/05/2016 1924  WBC 7.3  HGB 7.5*  HCT 26.7*  PLT 244   Coag's  Recent Labs Lab 02/11/2016 1924  APTT 30  INR 1.64*   BMET  Recent Labs Lab 02/09/2016 1924  NA 151*  K 4.1  CL 115*  CO2 22  BUN 56*  CREATININE 1.09*  GLUCOSE 130*   Electrolytes  Recent Labs Lab 01/29/2016 1924  CALCIUM 12.1*   Sepsis Markers  Recent Labs Lab 02/21/2016 1936  LATICACIDVEN 7.93*   ABG  Recent  Labs Lab 02/25/2016 1929  PHART 7.424  PCO2ART 41.4  PO2ART 371.0*   Liver Enzymes No results for input(s): AST, ALT, ALKPHOS, BILITOT, ALBUMIN in the last 168 hours. Cardiac Enzymes No results for input(s): TROPONINI, PROBNP in the last 168 hours. Glucose  Recent Labs Lab 02/03/2016 1946  GLUCAP 131*    Imaging Dg Chest Port 1 View  02/12/2016  CLINICAL DATA:  Hypoxia.  Status post cardiac arrest EXAM: PORTABLE CHEST 1 VIEW COMPARISON:  September 09, 2015 FINDINGS: Endotracheal tube tip is in the right main bronchus. Port-A-Cath tip is in the superior vena cava. No pneumothorax. No edema or consolidation. The heart size and pulmonary vascularity are normal. No adenopathy. There is atherosclerotic calcification throughout the aorta. There is an old fracture of the right seventh rib posteriorly, healed. IMPRESSION: Endotracheal tube tip is in the right main bronchus. Advise withdrawing endotracheal tube approximately 8 cm. No edema or consolidation. No pneumothorax. Critical Value/emergent results were called by telephone at the time of interpretation on 02/06/2016 at 7:42 pm to Dr. Gerhard Munch , who verbally acknowledged these results. Electronically Signed   By: Bretta Bang III M.D.   On: 02/02/2016 19:44    EKG: Sinus rhythm CXR: R mainstem ETT, no infiltrate. Elev. R hemidiaphragm.   ASSESSMENT / PLAN:  Active Problems:   Cardiac arrest (HCC)   PULMONARY A: Aspiration leading to hypoxia, then cardiac arrest. Need for mechanical ventilation P:   S/p intubation,  normal A-A gradient, suspect aspiration into airway, with plug. High risk for prolonged / failed extubation.  CARDIOVASCULAR A: S/p cardiac arrest, suspected etiology of hypoxia P:   Not a candidate for cooling due to co-morbidities.  RENAL A: Significant renal insufficiency Hyperkalemia P:   Has had very poor PO intake for some time Gentle hydration, starting MIV of NS at 50/hr, plus free water of 100 q6h, will likely need to increase.  GASTROINTESTINAL A: Marked malnutrition P:   Will need TF regimen, mentioned PEG with daughter.  HEMATOLOGIC A: Anemia P:   Suspect chronic disease, malnutrition, etc. Dominant factors.  INFECTIOUS A: Possible infection P:   Unclear if has infection, unclear source. CXR and U/A not particularly convincing, but clinical situation is compatible with subacute infection causing decline.  ENDOCRINE A: Hypoglycemia P:   Resolved  NEUROLOGIC A: Dementia P:   Causing major concerns about prognosis / goals of care  BEST PRACTICE / DISPOSITION Level of Care:  ICU Primary Service:  PCCM Consultants:  None Code Status:  Full Diet:  NPO DVT Px:  Heparin GI Px:  None Skin Integrity:  Intact Social / Family:  See below  TODAY'S SUMMARY: 82 y/o woman s/p respiratory and cardiac arrest, resolved.   Family discussion: I discussed at length (60 minutes or more) the patient's situation with her daughter, who was acting somewhat odd -- speaking in a smooth, conciliatory manner, clearly projecting an image of one who was undisturbed by the poor prognosis for her mother. She openly admitted that she was not letting "any negativity into the room" because if she allowed herself to do so, she would "fall apart." I spent some time exploring the daughters emotions and motivations for the decisions which brought her mother to where she is tonight, and she stated that she believed her mother hadn't given up on life yet, and that she would keep  fighting for her mother to live until she (the patient) had given up the fight. I tried to tease out what that  may look like, but the daughter was unable to converse further on this. I explained that regardless of outlook, attitude, or action, she and the MD teams have no choice but to determine what the end of her life will be like (ICU vs palliative). She seemed to recognize this, and indicated she was unafraid of the ICU, and I explained that she was likely to receive a lot of concern from the medical and nursing staff on the human dignity costs of her decision to continue aggressive care. Still, no plans were made. The daughter did relate multiple stories indicating her desire to be calm and work with the medical teams, but occasional made statements that sounded threatening ("if you had said that [something negative about prognosis], you'd wind up here [in the emergency room"). I cautioned her not to make threatening statements, or ones that could be perceived as threatening, as it would greatly interfere with her ability to work with the medical team to provide good care for her mother. She denied making any threats, and tried unconvincingly to explain the threat as a misunderstanding about seeing someone tripping in the hallway.  I have personally obtained a history, examined the patient, evaluated laboratory and imaging results, formulated the assessment and plan and placed orders.  CRITICAL CARE: The patient is critically ill with multiple organ systems failure and requires high complexity decision making for assessment and support, frequent evaluation and titration of therapies, application of advanced monitoring technologies and extensive interpretation of multiple databases. Critical Care Time devoted to patient care services described in this note is 120 minutes.    Luz Brazen, MD Pulmonary and [place] Pager: [phone]   02/07/2016, 11:08  PM

## 2016-02-01 NOTE — Telephone Encounter (Signed)
Called daughter inform her before any test can be order pt will need appt. Made appt for Tues @ 2:30. In the meantime she is wanting to know can mom have refill on Tramadol & Decadron until appt...Johny Chess

## 2016-02-01 NOTE — Telephone Encounter (Signed)
Received note from [facility] from [facility] nurse. States family wants Dr Alvy Bimler to order an X Ray for a lump in patient's clavicle. Dr Alvy Bimler states patient is with Hospice, not on active treatment, and disease is progressing. Will not order X ray for this reason. LM for Hospice RN.  Pt's daughter also left message on [facility] triage line stating they think Victoria [name] has broken her arm and they would like an X Ray on this new lump. RN called Hospice again and let them know patient would need to got he ED if they feel she has a broken arm,

## 2016-02-01 NOTE — ED Provider Notes (Signed)
CSN: [id number]     Arrival date & time 02/21/2016  1914 History   First MD Initiated Contact with Patient 01/31/2016 1915     Chief Complaint  Patient presents with  . Post CPR     The history is provided by a relative, a caregiver and the EMS personnel. The history is limited by the condition of the patient. No language interpreter was used.   19F with PMH multiple myeloma (in remission), previous episodes of Vtach, dementia, failure to thrive, presents via EMS after witnessed arrest at home. Per EMS patient was witnessed to stop breathing by family. She received a few rescue breaths by daughter, but was noted to have a pulse at that time. On EMS arrival about 10 minutes later, patient was noted to be bradycardic to 20s, with pulse at that time. Subsequently developed a wide complex rhythm for about 5 minutes. Upon transfer into ambulance patient lost pulses and was noted to be in cardiac arrest. Received CPR for about 4 minutes w/ epi x1, poct glucose 52 and 1amp D50 given. Additional 1 minute CPR, epi x1 given until ROSC achieved. Repeat blood glucose 173. BP 110/50 just prior to arrival.   Past Medical History  Diagnosis Date  . Hypertension   . Insomnia   . Anxiety   . Anemia   . Diverticulosis 04/2001  . Perforation bowel ([place])   . Ringing in ears     Wears hearing aides to drown out   . Rectal bleeding 02/03/2014  . Anemia in chronic illness 01/24/2015  . Multiple myeloma     In remission   Past Surgical History  Procedure Laterality Date  . Sigmoid resection / rectopexy  01/2011    perforation/stoma  . Colonoscopy    . Partial hysterectomy    . Cesarean section    . Colostomy takedown  06/27/11  . Cholecystectomy  2012    laparoscopic   Family History  Problem Relation Age of Onset  . Hypertension Other   . Prostate cancer Father   . Colitis Neg Hx   . Esophageal cancer Neg Hx   . Stomach cancer Neg Hx    Social History  Substance Use Topics  . Smoking status: Never  Smoker   . Smokeless tobacco: Never Used  . Alcohol Use: No   OB History    No data available     Review of Systems  Unable to perform ROS: Acuity of condition      Allergies  Amoxicillin; Aspirin; Atenolol; Donepezil; Doxycycline; Nortriptyline; Amiodarone; Latex; and Zoloft  Home Medications   Prior to Admission medications   Medication Sig Start Date End Date Taking? Authorizing Provider  acetaminophen (TYLENOL) 325 MG tablet Take 2 tablets (650 mg total) by mouth every 6 (six) hours as needed for mild pain (or Fever >/= 101). 08/28/15   [name], MD  cholecalciferol (VITAMIN D) 1000 UNITS tablet Take 2,000 Units by mouth daily.    Historical Provider, MD  feeding supplement, ENSURE ENLIVE, (ENSURE ENLIVE) LIQD Take 237 mLs by mouth 2 (two) times daily between meals. 09/11/15   Geradine Girt, DO  ipratropium-albuterol (DUONEB) 0.5-2.5 (3) MG/3ML SOLN Take 3 mLs by nebulization every 6 (six) hours as needed. 09/11/15   Geradine Girt, DO  LORazepam (ATIVAN) 1 MG tablet Take 1 tablet (1 mg total) by mouth 2 (two) times daily as needed for anxiety. 10/22/15   Aleksei Plotnikov V, MD  megestrol (MEGACE) 40 MG tablet Take 1 tablet (  40 mg total) by mouth 2 (two) times daily. 10/22/15   Aleksei Plotnikov V, MD  Multiple Vitamins-Minerals (MULTIVITAMIN & MINERAL PO) Take 1 tablet by mouth daily.    Historical Provider, MD  pantoprazole (PROTONIX) 40 MG tablet Take 1 tablet (40 mg total) by mouth daily. 09/11/15   Joseph Art, DO  QUEtiapine (SEROQUEL) 50 MG tablet Take 1 tablet (50 mg total) by mouth at bedtime. 10/22/15   Aleksei Plotnikov V, MD  triamcinolone (KENALOG) 0.025 % ointment Apply 1 application topically 2 (two) times daily. Use tid prn dry skin/lips 10/22/15   Aleksei Plotnikov V, MD   BP 84/59 mmHg  Pulse 100  Temp(Src) 97 F (36.1 C) (Other (Comment))  Resp 16  Ht 5\' 1"  (1.549 m)  Wt 32.5 kg  BMI 13.55 kg/m2  SpO2 100% Physical Exam  Constitutional:  Elderly,  chronically ill appearing AAF. Appears severely cachectic.   HENT:  Head: Normocephalic and atraumatic.  Temporal wasting bilaterally  Neck: No tracheal deviation present.  Cardiovascular: Regular rhythm and intact distal pulses.  Tachycardia present.   Pulmonary/Chest: She has no wheezes. She has no rales.  ETT in place. Bilateral breath sounds diminished L>R. Portacath in right upper chest.   Abdominal:  Extremely thin. No distension or masses palpated.  Musculoskeletal:  Very thin extremities. Unable to participate in strength and ROM testing due to mental status/intubation.   Neurological:  Intubated. Initially biting on ETT with minimal movements of extremities.     ED Course  Procedures (including critical care time) Labs Review Labs Reviewed  BASIC METABOLIC PANEL - Abnormal; Notable for the following:    Sodium 151 (*)    Chloride 115 (*)    Glucose, Bld 130 (*)    BUN 56 (*)    Creatinine, Ser 1.09 (*)    Calcium 12.1 (*)    GFR calc non Af Amer 46 (*)    GFR calc Af Amer 53 (*)    All other components within normal limits  CBC - Abnormal; Notable for the following:    RBC 2.70 (*)    Hemoglobin 7.5 (*)    HCT 26.7 (*)    MCHC 28.1 (*)    RDW 20.9 (*)    All other components within normal limits  PROTIME-INR - Abnormal; Notable for the following:    Prothrombin Time 19.5 (*)    INR 1.64 (*)    All other components within normal limits  URINALYSIS, ROUTINE W REFLEX MICROSCOPIC (NOT AT [facility]) - Abnormal; Notable for the following:    APPearance TURBID (*)    Hgb urine dipstick TRACE (*)    Protein, ur 100 (*)    All other components within normal limits  URINE MICROSCOPIC-ADD ON - Abnormal; Notable for the following:    Squamous Epithelial / LPF 6-30 (*)    Bacteria, UA MANY (*)    Casts HYALINE CASTS (*)    All other components within normal limits  I-STAT CG4 LACTIC ACID, ED - Abnormal; Notable for the following:    Lactic Acid, Venous 7.93 (*)    All  other components within normal limits  I-STAT TROPOININ, ED - Abnormal; Notable for the following:    Troponin i, poc 0.09 (*)    All other components within normal limits  CBG MONITORING, ED - Abnormal; Notable for the following:    Glucose-Capillary 131 (*)    All other components within normal limits  I-STAT ARTERIAL BLOOD GAS, ED - Abnormal; Notable  for the following:    pO2, Arterial 371.0 (*)    Bicarbonate 27.1 (*)    All other components within normal limits  MRSA PCR SCREENING  CULTURE, BLOOD (ROUTINE X 2)  CULTURE, BLOOD (ROUTINE X 2)  APTT  BLOOD GAS, ARTERIAL  CREATININE, SERUM  CBC  BASIC METABOLIC PANEL  MAGNESIUM  PHOSPHORUS    Imaging Review No results found. I have personally reviewed and evaluated these images and lab results as part of my medical decision-making.   EKG Interpretation   Date/Time:  Friday Feb 01 2016 19:19:35 EDT Ventricular Rate:  97 PR Interval:  128 QRS Duration: 76 QT Interval:  392 QTC Calculation: 498 R Axis:   71 Text Interpretation:  Fast sinus arrhythmia Probable left atrial  enlargement Borderline T wave abnormalities Borderline prolonged QT  interval Sinus rhythm Artifact nonsustained ventricular beats T wave  abnormality Abnormal ekg Confirmed by Carmin Muskrat  MD ([id number]) on  02/07/2016 7:26:54 PM      MDM   Final diagnoses:  Cardiac arrest ([place])   P is an 16F with PMH as above who presents post cardiac arrest. Received 5 minutes CPR, Epi x2, 1 amp D50, and was intubated prior to arrival. See HPI for additional details.   On presentation, patient had pulses w/ initial BP normotensive, O2 sats 100% on ventilatory support. Appears extremely cachectic on exam. Transferred onto the ventilator. Placed on the cardiac monitor and IV access established x2. EKG abnormal with borderline T wave abnormalities and borderline long QT but no acute ST elevations. Post arrest labs sent. Was noted to be clamping teeth on the tube, with  minimal movements in upper extremities, and intermittent sedation with fentanyl/versed ordered.  Repeat POC glucose 131. ABG without significant abnormalities. POC lactate elevated c/w receiving CPR. POC troponin elevated at 0.09. CXR obtained and revealed ETT in R mainstem; subsequently withdrawn 8cm. No focal signs of infection on CXR.   Daughter arrived and was updated on plan of care. Reports she is primary decision maker for her mother (there is a brother who is on his way as well) and that Ms. [place] is Full code. They would like chest compressions, intubation, and central line/pressors as necessary. Reports baseline mental status is poor, and patient is minimally verbal, but will track visually and sometimes call her daughter's name. She has also had some difficulty swallowing recently with poor po intake. She is wheelchair bound at baseline. Per medical records, palliative care has been discussed with family in the past and so far they have declined.   Labs as above. Worsening anemia c/w previous. No leukocytosis to indicate infection. Hypernatremia with elevated BUN/Cr, GFR 53. This is consistent with reported poor po intake.   Given post-arrest and respiratory failure requiring intubation, intensivist consulted for admission to ICU for further management. Admitted without further acute issues during my care.   Discussed with Dr. Vanita Panda.  Gibson Ramp, MD 02/02/16 [id number]  Carmin Muskrat, MD 02/07/16 [id number]

## 2016-02-01 NOTE — ED Notes (Signed)
Peri care done prior to foley insertion.  Patient had stool and was cleaned with soap and water

## 2016-02-01 NOTE — ED Notes (Signed)
Nurse unable to take report at this time.  Will call this nurse back

## 2016-02-01 NOTE — Progress Notes (Signed)
Chaplain escorted pt's daughter and friend to the consultation room where they met with MD.  Per RN agreement, they came to the pt's room.   Will follow.  Rev. Clois Dupes [phone]

## 2016-02-01 NOTE — Telephone Encounter (Signed)
Left msg on triage stating think mom has a fracture & lipoma on her chest wanting md to order imaging to check to see whats going on...Victoria [name]

## 2016-02-01 NOTE — Progress Notes (Signed)
eLink Physician-Brief Progress Note Patient Name: REANA NICKLESON DOB: 04/16/1932 MRN: [id number]   Date of Service  02/26/2016  HPI/Events of Note  Best Practice  eICU Interventions  PPI for stress ulcer propy while intubated     Intervention Category Intermediate Interventions: Best-practice therapies (e.g. DVT, beta blocker, etc.)  Seward Coran 02/06/2016, 11:45 PM

## 2016-02-02 ENCOUNTER — Inpatient Hospital Stay (HOSPITAL_COMMUNITY)

## 2016-02-02 DIAGNOSIS — I469 Cardiac arrest, cause unspecified: Principal | ICD-10-CM

## 2016-02-02 DIAGNOSIS — L8990 Pressure ulcer of unspecified site, unspecified stage: Secondary | ICD-10-CM | POA: Insufficient documentation

## 2016-02-02 DIAGNOSIS — J690 Pneumonitis due to inhalation of food and vomit: Secondary | ICD-10-CM

## 2016-02-02 LAB — POCT I-STAT 3, ART BLOOD GAS (G3+)
Acid-Base Excess: 5 mmol/L — ABNORMAL HIGH (ref 0.0–2.0)
Bicarbonate: 28.4 mEq/L — ABNORMAL HIGH (ref 20.0–24.0)
O2 Saturation: 98 %
PCO2 ART: 37.3 mmHg (ref 35.0–45.0)
PH ART: 7.49 — AB (ref 7.350–7.450)
TCO2: 30 mmol/L (ref 0–100)
pO2, Arterial: 101 mmHg — ABNORMAL HIGH (ref 80.0–100.0)

## 2016-02-02 LAB — GLUCOSE, CAPILLARY
GLUCOSE-CAPILLARY: 73 mg/dL (ref 65–99)
GLUCOSE-CAPILLARY: 224 mg/dL — AB (ref 65–99)
GLUCOSE-CAPILLARY: 72 mg/dL (ref 65–99)
Glucose-Capillary: 54 mg/dL — ABNORMAL LOW (ref 65–99)
Glucose-Capillary: 82 mg/dL (ref 65–99)

## 2016-02-02 LAB — BASIC METABOLIC PANEL
ANION GAP: 10 (ref 5–15)
BUN: 73 mg/dL — ABNORMAL HIGH (ref 6–20)
CHLORIDE: 110 mmol/L (ref 101–111)
CO2: 28 mmol/L (ref 22–32)
Calcium: 11.5 mg/dL — ABNORMAL HIGH (ref 8.9–10.3)
Creatinine, Ser: 1.18 mg/dL — ABNORMAL HIGH (ref 0.44–1.00)
GFR calc non Af Amer: 42 mL/min — ABNORMAL LOW (ref >60)
GFR, EST AFRICAN AMERICAN: 48 mL/min — AB (ref >60)
Glucose, Bld: 108 mg/dL — ABNORMAL HIGH (ref 65–99)
POTASSIUM: 4 mmol/L (ref 3.5–5.1)
SODIUM: 148 mmol/L — AB (ref 135–145)

## 2016-02-02 LAB — CREATININE, SERUM
Creatinine, Ser: 1.15 mg/dL — ABNORMAL HIGH (ref 0.44–1.00)
GFR calc non Af Amer: 43 mL/min — ABNORMAL LOW (ref >60)
GFR, EST AFRICAN AMERICAN: 50 mL/min — AB (ref >60)

## 2016-02-02 LAB — CBC
HEMATOCRIT: 25 % — AB (ref 36.0–46.0)
HEMOGLOBIN: 7.1 g/dL — AB (ref 12.0–15.0)
MCH: 27.7 pg (ref 26.0–34.0)
MCHC: 28.4 g/dL — AB (ref 30.0–36.0)
MCV: 97.7 fL (ref 78.0–100.0)
Platelets: 291 10*3/uL (ref 150–400)
RBC: 2.56 MIL/uL — ABNORMAL LOW (ref 3.87–5.11)
RDW: 20.6 % — AB (ref 11.5–15.5)
WBC: 1.7 10*3/uL — AB (ref 4.0–10.5)

## 2016-02-02 LAB — PHOSPHORUS: PHOSPHORUS: 5.8 mg/dL — AB (ref 2.5–4.6)

## 2016-02-02 LAB — MRSA PCR SCREENING: MRSA BY PCR: POSITIVE — AB

## 2016-02-02 LAB — MAGNESIUM: MAGNESIUM: 1.7 mg/dL (ref 1.7–2.4)

## 2016-02-02 MED ORDER — FREE WATER
100.0000 mL | Freq: Four times a day (QID) | Status: DC
  Administered 2016-02-02 – 2016-02-05 (×12): 100 mL

## 2016-02-02 MED ORDER — ANTISEPTIC ORAL RINSE SOLUTION (CORINZ)
7.0000 mL | OROMUCOSAL | Status: DC
  Administered 2016-02-02 – 2016-02-08 (×53): 7 mL via OROMUCOSAL

## 2016-02-02 MED ORDER — VANCOMYCIN HCL 500 MG IV SOLR: 500.0000 mg | INTRAVENOUS | Status: DC

## 2016-02-02 MED ORDER — FREE WATER
100.0000 mL | Freq: Four times a day (QID) | Status: DC
  Administered 2016-02-02 (×3): 100 mL

## 2016-02-02 MED ORDER — DEXTROSE 5 % IV SOLN
500.0000 mg | INTRAVENOUS | Status: DC
  Administered 2016-02-02 – 2016-02-03 (×2): 500 mg via INTRAVENOUS
  Filled 2016-02-02 (×4): qty 0.5

## 2016-02-02 MED ORDER — SODIUM CHLORIDE 0.9 % IV SOLN
500.0000 mg | INTRAVENOUS | Status: AC
  Administered 2016-02-02: 500 mg via INTRAVENOUS
  Filled 2016-02-02: qty 500

## 2016-02-02 MED ORDER — CHLORHEXIDINE GLUCONATE CLOTH 2 % EX PADS
6.0000 | MEDICATED_PAD | Freq: Every day | CUTANEOUS | Status: AC
  Administered 2016-02-02 – 2016-02-06 (×5): 6 via TOPICAL

## 2016-02-02 MED ORDER — DEXTROSE 50 % IV SOLN
INTRAVENOUS | Status: AC
  Administered 2016-02-02: 50 mL via INTRAVENOUS
  Filled 2016-02-02: qty 50

## 2016-02-02 MED ORDER — DEXTROSE 5 % IV SOLN
1.0000 g | INTRAVENOUS | Status: AC
  Administered 2016-02-02: 1 g via INTRAVENOUS
  Filled 2016-02-02 (×2): qty 1

## 2016-02-02 MED ORDER — SODIUM CHLORIDE 0.9 % IV SOLN
INTRAVENOUS | Status: DC
  Administered 2016-02-02 (×2): via INTRAVENOUS

## 2016-02-02 MED ORDER — DEXTROSE 50 % IV SOLN
50.0000 mL | Freq: Once | INTRAVENOUS | Status: AC
  Administered 2016-02-02: 50 mL via INTRAVENOUS

## 2016-02-02 MED ORDER — VITAL HIGH PROTEIN PO LIQD: 1000.0000 mL | ORAL | Status: DC

## 2016-02-02 MED ORDER — MUPIROCIN 2 % EX OINT
1.0000 "application " | TOPICAL_OINTMENT | Freq: Two times a day (BID) | CUTANEOUS | Status: AC
  Administered 2016-02-02 – 2016-02-06 (×10): 1 via NASAL
  Filled 2016-02-02 (×3): qty 22

## 2016-02-02 MED ORDER — PRO-STAT SUGAR FREE PO LIQD
30.0000 mL | Freq: Two times a day (BID) | ORAL | Status: DC
  Administered 2016-02-02 – 2016-02-05 (×7): 30 mL
  Filled 2016-02-02 (×7): qty 30

## 2016-02-02 MED ORDER — MAGNESIUM SULFATE 2 GM/50ML IV SOLN
2.0000 g | Freq: Once | INTRAVENOUS | Status: AC
  Administered 2016-02-02: 2 g via INTRAVENOUS
  Filled 2016-02-02: qty 50

## 2016-02-02 MED ORDER — FREE WATER: 60.0000 mL | Freq: Three times a day (TID) | Status: DC

## 2016-02-02 MED ORDER — VITAL AF 1.2 CAL PO LIQD
1000.0000 mL | ORAL | Status: DC
  Administered 2016-02-02 – 2016-02-04 (×3): 1000 mL

## 2016-02-02 NOTE — Progress Notes (Signed)
PULMONARY / CRITICAL CARE MEDICINE HISTORY AND PHYSICAL EXAMINATION   Name: Victoria [name] MRN: [id number] DOB: 1932/01/21    ADMISSION DATE:  02/22/2016  PRIMARY SERVICE: PCCM  CHIEF COMPLAINT:  Cardiac arrest  BRIEF PATIENT DESCRIPTION: 82 y/o woman with dementia, MM, FTT, on hospice presenting w/ respiratory -> Cardiac arrest 2/2 aspiration  SIGNIFICANT EVENTS / STUDIES:  - Bradycardia -> arrest in field, ROSC obtained after 6 mins, epi x1 - Intubated in ED  LINES / TUBES: - ETT 5/5 - OGT 5/5 - Portacath (Oct '16)  CULTURES: Blood 5/5 (pending) Urine 5/5 (in process)  ANTIBIOTICS: Vanc 5/5>> Zosyn  5/5 >>  HISTORY OF PRESENT ILLNESS:   Victoria [name] is a 82 y/o woman with advanced dementia, MM, and failure to thrive who presents to the ED via EMS after a cardiac arrest at home. The patient's daughter and caregiver report that over the past week her functional status has declined, and has remained bed ridden for the last day or so. Yesterday she was unable to take food by mouth, and when she tried to swallow soft foods, they didn't go down, and came out of her mouth (without a cough) after a minute or so. Today her family tried to give her liquids (broths, ensure, etc) but she didn't swallow much. She was minimally responsive to family. She made some gurgling sounds, and then later was found to be apnic. EMS was called, and the family member began rescue breathing. Upon arrival, the patient was in a wide complex rhythm, and then developed bradycardia and then PEA. CPR was begun, and after six minutes of CPR with epi x1, ROSC was obtained. She was taken to the ED and PCCM was called for admission.  SUBJECTIVE / interval events:  Pt remains unresponsive, may have looked at her son to voice  VITAL SIGNS: Temp:  [95.9 F (35.5 C)-98.3 F (36.8 C)] 98.3 F (36.8 C) (05/06 0800) Pulse Rate:  [88-112] 99 (05/06 1100) Resp:  [16-29] 17 (05/06 1100) BP: (83-124)/(55-95) 112/67 mmHg  (05/06 1100) SpO2:  [97 %-100 %] 100 % (05/06 1100) FiO2 (%):  [40 %] 40 % (05/06 1100) Weight:  [32.4 kg (71 lb 6.9 oz)-42.185 kg (93 lb)] 32.4 kg (71 lb 6.9 oz) (05/06 0409) HEMODYNAMICS:   VENTILATOR SETTINGS: Vent Mode:  [-] PRVC FiO2 (%):  [40 %] 40 % Set Rate:  [10 bmp-14 bmp] 10 bmp Vt Set:  [340 mL-450 mL] 340 mL PEEP:  [5 cmH20] 5 cmH20 Plateau Pressure:  [14 cmH20-25 cmH20] 14 cmH20 INTAKE / OUTPUT: Intake/Output      05/05 0701 - 05/06 0700 05/06 0701 - 05/07 0700   I.V. (mL/kg) 1309.2 (40.4) 200 (6.2)   NG/GT 100    IV Piggyback 200    Total Intake(mL/kg) 1609.2 (49.7) 200 (6.2)   Urine (mL/kg/hr) 170 30 (0.2)   Stool 0 0 (0)   Total Output 170 30   Net +1439.2 +170        Stool Occurrence 1 x 1 x     PHYSICAL EXAMINATION: General:  Extremely cachectic woman on ventilator, no response. Neuro:  Unresponsive, weak corneal reflexes, + gag and cough HEENT:  Dry MM Neck: No LAD Cardiovascular:  Heart sounds dual and normal. Lungs:  CTAB. Abdomen:  Soft, sacphoid Musculoskeletal:  Extremely reduced muscle bulk Skin:  No rashes  LABS:  CBC  Recent Labs Lab 01/31/2016 1924 02/02/16 0440  WBC 7.3 1.7*  HGB 7.5* 7.1*  HCT 26.7* 25.0*  PLT 244 291   Coag's  Recent Labs Lab 01/31/2016 1924  APTT 30  INR 1.64*   BMET  Recent Labs Lab 02/15/2016 1924 02/14/2016 2142 02/02/16 0440  NA 151*  --  148*  K 4.1  --  4.0  CL 115*  --  110  CO2 22  --  28  BUN 56*  --  73*  CREATININE 1.09* 1.15* 1.18*  GLUCOSE 130*  --  108*   Electrolytes  Recent Labs Lab 02/21/2016 1924 02/02/16 0440  CALCIUM 12.1* 11.5*  MG  --  1.7  PHOS  --  5.8*   Sepsis Markers  Recent Labs Lab 01/31/2016 1936  LATICACIDVEN 7.93*   ABG  Recent Labs Lab 02/12/2016 1929 02/02/16 0327  PHART 7.424 7.490*  PCO2ART 41.4 37.3  PO2ART 371.0* 101.0*   Liver Enzymes No results for input(s): AST, ALT, ALKPHOS, BILITOT, ALBUMIN in the last 168 hours. Cardiac Enzymes No  results for input(s): TROPONINI, PROBNP in the last 168 hours. Glucose  Recent Labs Lab 02/14/2016 1946 02/02/16 0837  GLUCAP 131* 73    Imaging Dg Chest Port 1 View  02/02/2016  CLINICAL DATA:  Hypoxia EXAM: PORTABLE CHEST 1 VIEW COMPARISON:  Feb 01, 2016 FINDINGS: Endotracheal tube tip is 4.5 cm above the carina. Nasogastric tube tip and side port are in the stomach. Port-A-Cath tip is at the cavoatrial junction. No pneumothorax. There is patchy airspace opacity in the left lower lobe. Lungs elsewhere are clear. Heart size and pulmonary vascular normal. No adenopathy. IMPRESSION: Tube and catheter positions as described without pneumothorax. Patchy airspace opacity left base, likely pneumonia. Lungs elsewhere clear. Stable cardiac silhouette. Electronically Signed   By: Lowella Grip III M.D.   On: 02/02/2016 08:07   Dg Chest Port 1 View  02/06/2016  CLINICAL DATA:  Hypoxia.  Status post cardiac arrest EXAM: PORTABLE CHEST 1 VIEW COMPARISON:  September 09, 2015 FINDINGS: Endotracheal tube tip is in the right main bronchus. Port-A-Cath tip is in the superior vena cava. No pneumothorax. No edema or consolidation. The heart size and pulmonary vascularity are normal. No adenopathy. There is atherosclerotic calcification throughout the aorta. There is an old fracture of the right seventh rib posteriorly, healed. IMPRESSION: Endotracheal tube tip is in the right main bronchus. Advise withdrawing endotracheal tube approximately 8 cm. No edema or consolidation. No pneumothorax. Critical Value/emergent results were called by telephone at the time of interpretation on 02/16/2016 at 7:42 pm to Dr. Carmin Muskrat , who verbally acknowledged these results. Electronically Signed   By: Lowella Grip III M.D.   On: 02/09/2016 19:44    EKG: Sinus rhythm CXR: R mainstem ETT, no infiltrate. Elev. R hemidiaphragm.   ASSESSMENT / PLAN:  Active Problems:   Cardiac arrest (HCC)   Pressure  ulcer   PULMONARY A: Aspiration leading to hypoxia, then cardiac arrest. Need for mechanical ventilation P:   S/p intubation, normal A-A gradient, suspect aspiration into airway, with plug. High risk for prolonged / failed extubation.  CARDIOVASCULAR A: S/p cardiac arrest, suspected etiology of hypoxia P:   Not a candidate for cooling due to co-morbidities.  RENAL A: Significant renal insufficiency Hyperkalemia P:   Has had very poor PO intake for some time Gentle hydration, starting MIV of NS at 50/hr, plus free water of 100 q6h  GASTROINTESTINAL A: Marked malnutrition P:   Start TF  HEMATOLOGIC A: Anemia P:   Suspect chronic disease, malnutrition, etc. Dominant factors.  INFECTIOUS A: Possible LLL aspiration  PNA P:   - empiric abx as above  ENDOCRINE A: Hypoglycemia P:   SSI coverage on TF  NEUROLOGIC A: Dementia P:   Causing major concerns about prognosis / goals of care    TODAY'S SUMMARY: 82 y/o woman s/p respiratory and cardiac arrest, resolved.   Family discussion: Dr Ancil Linsey >> 5/5/ discussed at length (60 minutes or more) the patient's situation with her daughter, who was acting somewhat odd -- speaking in a smooth, conciliatory manner, clearly projecting an image of one who was undisturbed by the poor prognosis for her mother. She openly admitted that she was not letting "any negativity into the room" because if she allowed herself to do so, she would "fall apart." I spent some time exploring the daughters emotions and motivations for the decisions which brought her mother to where she is tonight, and she stated that she believed her mother hadn't given up on life yet, and that she would keep fighting for her mother to live until she (the patient) had given up the fight. I tried to tease out what that may look like, but the daughter was unable to converse further on this. I explained that regardless of outlook, attitude, or action, she and the MD  teams have no choice but to determine what the end of her life will be like (ICU vs palliative). She seemed to recognize this, and indicated she was unafraid of the ICU, and I explained that she was likely to receive a lot of concern from the medical and nursing staff on the human dignity costs of her decision to continue aggressive care. Still, no plans were made. The daughter did relate multiple stories indicating her desire to be calm and work with the medical teams, but occasional made statements that sounded threatening ("if you had said that [something negative about prognosis], you'd wind up here [in the emergency room"). I cautioned her not to make threatening statements, or ones that could be perceived as threatening, as it would greatly interfere with her ability to work with the medical team to provide good care for her mother. She denied making any threats, and tried unconvincingly to explain the threat as a misunderstanding about seeing someone tripping in the hallway.  Family 5/6: RB discussed status, guarded prognosis with daughter and son at bedside. Somewhat strange affect. Informed them that neuro recovery appears absent but that we need another 24h to assess for interval improvement. I did explain that if she is unable to follow commands 5/7 then chances for recovery to baseline are poor. Did not broach DNR status given wishes expresses to Dr Ancil Linsey last night. I will update them regarding MS and the options for further care including palliation after eval on 5/7.   Independent CC time 45 minutes  Baltazar Apo, MD, PhD 02/02/2016, 12:17 PM [facility] [phone] or if no answer [phone]

## 2016-02-02 NOTE — Progress Notes (Signed)
Hypoglycemic Event  CBG:54 Treatment: D50 IV 50 mL  Symptoms: None  Follow-up CBG: Time:1241 CBG Result:224  Possible Reasons for Event: Unknown  Comments/MD notified:Dr. Byrum notified, orders received.    Victoria [name]

## 2016-02-02 NOTE — Progress Notes (Addendum)
Initial Nutrition Assessment  DOCUMENTATION CODES:  Underweight, Severe malnutrition in context of chronic illness   Pt meets criteria for SEVERE MALNUTRITION in the context of CHRONIC ILLNESS as evidenced by severe muscle/fat wasting and loss of >14 % bw in 6 months.  INTERVENTION:   Per H&P, PEG was mentioned. Important to note that PEG has been shown, in general, NOT to decrease risk of aspiration, prevent development/or healing of existant PU, nor prolong life in end stage dementia. Furthermore given pt's deplorable state, PEG could potentially shorten life. Family has been seen by palliative in past and still wanted FULL CODE. A peg is strongly contraindicated in this patient. Recommend continue discussion of [place].   During ongoing discussion of [place], If indicated, Short term nutrition support via NG/OGT can be provided. However, Note pt is at High risk for refeeding syndrome. Recommend monitoring Mg, K, Ph daily, MD to replete as needed. TF should be Initiated at low rate  If tf to be initiated. Recs as follow: Vital 1.2@ 15 ml/hr via NGT/OGTand increase by 10 ml after 8 hours to goal rate of 25 ml/hr. Ensure electrolytes stable before advancing  30 ml Prostat BID.    Tube feeding regimen provides 920 kcal (103% of needs), 75 grams of protein, and 487 ml of H2O.   NUTRITION DIAGNOSIS:  Inadequate oral intake related to inability to eat as evidenced by NPO status.  GOAL:  Patient will meet greater than or equal to 90% of their needs  MONITOR:  Vent status, Labs, TF tolerance, Skin, I & O's, Goals of care  REASON FOR ASSESSMENT:  Consult Assessment of nutrition requirement/status+reccomendations for tube feeding  ASSESSMENT:  82 y/o female w/ dementia, HTN, Diverticulitis, anxiety, MM, FTT,  presenting w/ respiratory/cardiac arrest 2/2 aspiration.   Spoke with daughter. She was borderline condescending. Very minimal attention was paid to RD.   She thinks the patient weighs 97  lbs now. Her bedweight today was 84 lbs. Given the amount of objects on the bed, could very well be the 71 lbs documented on admission. That would mark a very significant weight loss of >14% in 6 months. Daughter was taken aback by this weight and does not think it is accurate. She states she carries the pt daily and she feels no lighter to her.   She reports the patient was eating solid food up until Wednesday. After that, she started experiencing trouble swallowing and subsequently only take fluids. She reports items such as broth, soup, protein drinks. She was taking a mvi until Thursday.   Denies any chronic diarrhea or constipation.   Daughter had some questions about the tube feeding such as what was in the formula and how it would be provided.   NFPE: Pt is severely cachectic/emaciated, contracted into the fetal position. All fat/muscle stores that were assessed showed severe depletion. Did not pursue entire physical exam due to blatant severe wasting and daughters disconcerting expression.   Other family members in the room did not speak throughout conversation  Patient is currently intubated on ventilator support MV: 8.4 L/min Temp (24hrs), Avg:96.8 F (36 C), Min:95.9 F (35.5 C), Max:98.3 F (36.8 C)  Labs reviewed: Hyperphospatemia, h/h: 7.1/25, rbc: 2.56   Recent Labs Lab 02/04/2016 1924 02/22/2016 2142 02/02/16 0440  NA 151*  --  148*  K 4.1  --  4.0  CL 115*  --  110  CO2 22  --  28  BUN 56*  --  73*  CREATININE 1.09* 1.15*  1.18*  CALCIUM 12.1*  --  11.5*  MG  --   --  1.7  PHOS  --   --  5.8*  GLUCOSE 130*  --  108*   Diet Order:  Diet NPO time specified  Skin: Full stage thickness PU to sacrum, PU stage 1 to heel as well as other PU stage 1 to shoulder  Last BM:  Unknown  Height:  Ht Readings from Last 1 Encounters:  01/28/2016 5\' 1"  (1.549 m)   Weight:  Wt Readings from Last 1 Encounters:  02/02/16 71 lb 6.9 oz (32.4 kg)   Wt Readings from Last 10  Encounters:  02/02/16 71 lb 6.9 oz (32.4 kg)  10/22/15 86 lb (39.009 kg)  09/08/15 83 lb 1.8 oz (37.7 kg)  09/06/15 89 lb (40.37 kg)  09/05/15 83 lb 11.2 oz (37.966 kg)  09/07/15 84 lb (38.102 kg)  08/28/15 95 lb 10.9 oz (43.4 kg)  08/08/15 93 lb 8 oz (42.411 kg)  07/25/15 94 lb 4 oz (42.752 kg)  07/02/15 95 lb (43.092 kg)   Ideal Body Weight:  47.73 kg  BMI:  Body mass index is 13.5 kg/(m^2).  Estimated Nutritional Needs:  Kcal:  892 kcals Protein:  65 g Pro (2 g/kg bw) Fluid:  Per MD  EDUCATION NEEDS:  No education needs identified at this time  Burtis Junes RD, LDN Clinical Nutrition Pager: [phone] 02/02/2016 10:41 AM

## 2016-02-02 NOTE — Progress Notes (Signed)
While advising daughter of unit rules/visitation policy she became irritated and wanted to know why it was different from [facility]. She also asked if we did transfer to [facility]. I advised her not in her mom's case (cardiac arrest).

## 2016-02-02 NOTE — Progress Notes (Signed)
eLink Physician-Brief Progress Note Patient Name: Victoria [name] DOB: Mar 01, 1932 MRN: [id number]   Date of Service  02/02/2016  HPI/Events of Note  Hypomag  eICU Interventions  Mag replaced     Intervention Category Intermediate Interventions: Electrolyte abnormality - evaluation and management  DETERDING,ELIZABETH 02/02/2016, 5:46 AM

## 2016-02-03 ENCOUNTER — Inpatient Hospital Stay (HOSPITAL_COMMUNITY)

## 2016-02-03 DIAGNOSIS — J9601 Acute respiratory failure with hypoxia: Secondary | ICD-10-CM

## 2016-02-03 LAB — CBC
HCT: 24.8 % — ABNORMAL LOW (ref 36.0–46.0)
HEMATOCRIT: 21.2 % — AB (ref 36.0–46.0)
HEMATOCRIT: 19.1 % — AB (ref 36.0–46.0)
HEMOGLOBIN: 6.1 g/dL — AB (ref 12.0–15.0)
HEMOGLOBIN: 5.7 g/dL — AB (ref 12.0–15.0)
Hemoglobin: 7.6 g/dL — ABNORMAL LOW (ref 12.0–15.0)
MCH: 27.9 pg (ref 26.0–34.0)
MCH: 27.9 pg (ref 26.0–34.0)
MCH: 28.6 pg (ref 26.0–34.0)
MCHC: 28.8 g/dL — AB (ref 30.0–36.0)
MCHC: 29.8 g/dL — ABNORMAL LOW (ref 30.0–36.0)
MCHC: 30.6 g/dL (ref 30.0–36.0)
MCV: 96.8 fL (ref 78.0–100.0)
MCV: 93.6 fL (ref 78.0–100.0)
MCV: 93.2 fL (ref 78.0–100.0)
Platelets: 234 10*3/uL (ref 150–400)
Platelets: 241 10*3/uL (ref 150–400)
Platelets: 233 10*3/uL (ref 150–400)
RBC: 2.19 MIL/uL — ABNORMAL LOW (ref 3.87–5.11)
RBC: 2.04 MIL/uL — ABNORMAL LOW (ref 3.87–5.11)
RBC: 2.66 MIL/uL — AB (ref 3.87–5.11)
RDW: 20.3 % — AB (ref 11.5–15.5)
RDW: 20.3 % — ABNORMAL HIGH (ref 11.5–15.5)
RDW: 19.6 % — AB (ref 11.5–15.5)
WBC: 7.1 10*3/uL (ref 4.0–10.5)
WBC: 7.4 10*3/uL (ref 4.0–10.5)
WBC: 7.4 10*3/uL (ref 4.0–10.5)

## 2016-02-03 LAB — GLUCOSE, CAPILLARY
GLUCOSE-CAPILLARY: 93 mg/dL (ref 65–99)
GLUCOSE-CAPILLARY: 102 mg/dL — AB (ref 65–99)
GLUCOSE-CAPILLARY: 89 mg/dL (ref 65–99)
GLUCOSE-CAPILLARY: 75 mg/dL (ref 65–99)
Glucose-Capillary: 112 mg/dL — ABNORMAL HIGH (ref 65–99)
Glucose-Capillary: 73 mg/dL (ref 65–99)
Glucose-Capillary: 96 mg/dL (ref 65–99)

## 2016-02-03 LAB — BASIC METABOLIC PANEL
Anion gap: 9 (ref 5–15)
BUN: 102 mg/dL — AB (ref 6–20)
CALCIUM: 9.1 mg/dL (ref 8.9–10.3)
CO2: 19 mmol/L — AB (ref 22–32)
CREATININE: 1.62 mg/dL — AB (ref 0.44–1.00)
Chloride: 117 mmol/L — ABNORMAL HIGH (ref 101–111)
GFR, EST AFRICAN AMERICAN: 33 mL/min — AB (ref >60)
GFR, EST NON AFRICAN AMERICAN: 28 mL/min — AB (ref >60)
Glucose, Bld: 83 mg/dL (ref 65–99)
Potassium: 3.8 mmol/L (ref 3.5–5.1)
SODIUM: 145 mmol/L (ref 135–145)

## 2016-02-03 LAB — PREPARE RBC (CROSSMATCH)

## 2016-02-03 MED ORDER — SODIUM CHLORIDE 0.9 % IV SOLN: Freq: Once | INTRAVENOUS | Status: DC

## 2016-02-03 MED ORDER — VANCOMYCIN HCL 500 MG IV SOLR
500.0000 mg | INTRAVENOUS | Status: DC
  Administered 2016-02-04: 500 mg via INTRAVENOUS
  Filled 2016-02-03: qty 500

## 2016-02-03 NOTE — Progress Notes (Signed)
eLink Physician-Brief Progress Note Patient Name: Victoria [name] DOB: [birth year] MRN: [id number]   Date of Service  02/03/2016  HPI/Events of Note   Recent Labs Lab 02/04/2016 1924 02/02/16 0440 02/03/16 0415  HGB 7.5* 7.1* 6.1*    Recent Labs Lab 02/22/2016 1924 02/02/16 0440 02/03/16 0415  HGB 7.5* 7.1* 6.1*  HCT 26.7* 25.0* 21.2*  WBC 7.3 1.7* 7.1  PLT 244 291 234    Anemia of critical illness. No active bleed per rn  eICU Interventions  1 unit prbc (family goals of care  Notes noted)     Intervention Category Major Interventions: Other:  [name] 02/03/2016, 5:10 AM

## 2016-02-03 NOTE — Progress Notes (Signed)
PULMONARY / CRITICAL CARE MEDICINE HISTORY AND PHYSICAL EXAMINATION   Name: Victoria [name] MRN: [id number] DOB: [birth year]    ADMISSION DATE:  02/19/2016  PRIMARY SERVICE: PCCM  CHIEF COMPLAINT:  Cardiac arrest  BRIEF PATIENT DESCRIPTION: 82 y/o woman with dementia, MM, FTT, on hospice presenting w/ respiratory -> Cardiac arrest 2/2 aspiration  SIGNIFICANT EVENTS / STUDIES:  - Bradycardia -> arrest in field, ROSC obtained after 6 mins, epi x1 - Intubated in ED  LINES / TUBES: - ETT 5/5 - OGT 5/5 - Portacath (Oct '16)  CULTURES: Blood 5/5 (pending) Urine 5/5 (in process)  ANTIBIOTICS: Vanc 5/5>> Zosyn  5/5 >>  HISTORY OF PRESENT ILLNESS:   Victoria [name] is a 82 y/o woman with advanced dementia, MM, and failure to thrive who presents to the ED via EMS after a cardiac arrest at home. The patient's daughter and caregiver report that over the past week her functional status has declined, and has remained bed ridden for the last day or so. Yesterday she was unable to take food by mouth, and when she tried to swallow soft foods, they didn't go down, and came out of her mouth (without a cough) after a minute or so. Today her family tried to give her liquids (broths, ensure, etc) but she didn't swallow much. She was minimally responsive to family. She made some gurgling sounds, and then later was found to be apnic. EMS was called, and the family member began rescue breathing. Upon arrival, the patient was in a wide complex rhythm, and then developed bradycardia and then PEA. CPR was begun, and after six minutes of CPR with epi x1, ROSC was obtained. She was taken to the ED and PCCM was called for admission.  SUBJECTIVE / interval events:  Improved MS - has followed some commands today Tolerating PSV for > 3hours  VITAL SIGNS: Temp:  [98.1 F (36.7 C)-100.1 F (37.8 C)] 100.1 F (37.8 C) (05/07 1014) Pulse Rate:  [77-121] 121 (05/07 1200) Resp:  [18-35] 24 (05/07 1200) BP:  (96-158)/(51-84) 158/80 mmHg (05/07 1200) SpO2:  [99 %-100 %] 100 % (05/07 1200) FiO2 (%):  [40 %] 40 % (05/07 1014) Weight:  [33.3 kg (73 lb 6.6 oz)] 33.3 kg (73 lb 6.6 oz) (05/07 0500) HEMODYNAMICS:   VENTILATOR SETTINGS: Vent Mode:  [-] PSV;CPAP FiO2 (%):  [40 %] 40 % Set Rate:  [10 bmp] 10 bmp Vt Set:  [340 mL] 340 mL PEEP:  [5 cmH20] 5 cmH20 Plateau Pressure:  [11 cmH20-16 cmH20] 16 cmH20 INTAKE / OUTPUT: Intake/Output      05/06 0701 - 05/07 0700 05/07 0701 - 05/08 0700   I.V. (mL/kg) 1200 (36) 200 (6)   Blood  335   NG/GT 687.3 100   IV Piggyback 50    Total Intake(mL/kg) 1937.3 (58.2) 635 (19.1)   Urine (mL/kg/hr) 155 (0.2) 80 (0.5)   Stool 0 (0)    Total Output 155 80   Net +1782.3 +555        Stool Occurrence 3 x      PHYSICAL EXAMINATION: General:  Extremely cachectic woman on ventilator Neuro:  Wakes to voice, follows commands, +corneal reflexes, + gag and cough HEENT:  Dry MM Neck: No LAD Cardiovascular:  Heart sounds dual and normal. Lungs:  CTAB. Abdomen:  Soft, sacphoid Musculoskeletal:  Extremely reduced muscle bulk Skin:  No rashes  LABS:  CBC  Recent Labs Lab 02/02/16 0440 02/03/16 0415 02/03/16 0545  WBC 1.7* 7.1 7.4  HGB 7.1*  6.1* 5.7*  HCT 25.0* 21.2* 19.1*  PLT 291 234 241   Coag's  Recent Labs Lab 02/06/2016 1924  APTT 30  INR 1.64*   BMET  Recent Labs Lab 02/19/2016 1924 02/12/2016 2142 02/02/16 0440 02/03/16 0415  NA 151*  --  148* 145  K 4.1  --  4.0 3.8  CL 115*  --  110 117*  CO2 22  --  28 19*  BUN 56*  --  73* 102*  CREATININE 1.09* 1.15* 1.18* 1.62*  GLUCOSE 130*  --  108* 83   Electrolytes  Recent Labs Lab 01/28/2016 1924 02/02/16 0440 02/03/16 0415  CALCIUM 12.1* 11.5* 9.1  MG  --  1.7  --   PHOS  --  5.8*  --    Sepsis Markers  Recent Labs Lab 02/04/2016 1936  LATICACIDVEN 7.93*   ABG  Recent Labs Lab 02/27/2016 1929 02/02/16 0327  PHART 7.424 7.490*  PCO2ART 41.4 37.3  PO2ART 371.0* 101.0*    Liver Enzymes No results for input(s): AST, ALT, ALKPHOS, BILITOT, ALBUMIN in the last 168 hours. Cardiac Enzymes No results for input(s): TROPONINI, PROBNP in the last 168 hours. Glucose  Recent Labs Lab 02/02/16 1943 02/02/16 2340 02/03/16 0419 02/03/16 0821 02/03/16 1115 02/03/16 1149  GLUCAP 72 112* 73 96 93 102*    Imaging Dg Chest Port 1 View  02/03/2016  CLINICAL DATA:  Hypoxia EXAM: PORTABLE CHEST 1 VIEW COMPARISON:  Feb 02, 2016 FINDINGS: Endotracheal tube tip is 4.5 cm above the carina. Nasogastric tube tip and side port are in the stomach. Port-A-Cath tip is in the superior vena cava. No pneumothorax. There is a new focal area of consolidation in the right base. Patchy airspace opacity in the left lower lobe is stable. The heart size and pulmonary vascularity are normal. There is atherosclerotic calcification in the aorta. No adenopathy. IMPRESSION: Tube and catheter positions as described without pneumothorax. New area of focal consolidation right base. Stable airspace opacity left lower lobe. Suspect bibasilar pneumonia. Stable cardiac silhouette. Electronically Signed   By: Lowella Grip III M.D.   On: 02/03/2016 07:23   Dg Chest Port 1 View  02/02/2016  CLINICAL DATA:  Hypoxia EXAM: PORTABLE CHEST 1 VIEW COMPARISON:  Feb 01, 2016 FINDINGS: Endotracheal tube tip is 4.5 cm above the carina. Nasogastric tube tip and side port are in the stomach. Port-A-Cath tip is at the cavoatrial junction. No pneumothorax. There is patchy airspace opacity in the left lower lobe. Lungs elsewhere are clear. Heart size and pulmonary vascular normal. No adenopathy. IMPRESSION: Tube and catheter positions as described without pneumothorax. Patchy airspace opacity left base, likely pneumonia. Lungs elsewhere clear. Stable cardiac silhouette. Electronically Signed   By: Lowella Grip III M.D.   On: 02/02/2016 08:07   Dg Chest Port 1 View  02/26/2016  CLINICAL DATA:  Hypoxia.  Status post  cardiac arrest EXAM: PORTABLE CHEST 1 VIEW COMPARISON:  September 09, 2015 FINDINGS: Endotracheal tube tip is in the right main bronchus. Port-A-Cath tip is in the superior vena cava. No pneumothorax. No edema or consolidation. The heart size and pulmonary vascularity are normal. No adenopathy. There is atherosclerotic calcification throughout the aorta. There is an old fracture of the right seventh rib posteriorly, healed. IMPRESSION: Endotracheal tube tip is in the right main bronchus. Advise withdrawing endotracheal tube approximately 8 cm. No edema or consolidation. No pneumothorax. Critical Value/emergent results were called by telephone at the time of interpretation on 02/14/2016 at 7:42 pm to Dr.  Carmin Muskrat , who verbally acknowledged these results. Electronically Signed   By: Lowella Grip III M.D.   On: 02/07/2016 19:44    EKG: Sinus rhythm CXR: R mainstem ETT, no infiltrate. Elev. R hemidiaphragm.   ASSESSMENT / PLAN:  Active Problems:   Cardiac arrest (HCC)   Pressure ulcer   PULMONARY A: Aspiration leading to hypoxia, then cardiac arrest. Need for mechanical ventilation P:   S/p intubation, normal A-A gradient, suspect aspiration into airway, with plug. High risk for prolonged / failed extubation due to deconditioning but MS is improved. Suspect that we will be able to push for an extubation next 24h - need to address w family goals if she were to fail. She is a poor candidate for reintubation or long term support.   CARDIOVASCULAR A: S/p cardiac arrest, suspected etiology of hypoxia P:   Cooling deferred   RENAL A: Acute on chronic renal failure Hyperkalemia P:   Has had very poor PO intake for some time Gentle hydration, starting MIV of NS at 50/hr, plus free water of 100 q6h  GASTROINTESTINAL A: Marked malnutrition P:   Start TF  HEMATOLOGIC A: Anemia P:   Suspect chronic disease, malnutrition, etc. Dominant factors. S/p PRBC 1u on 5/7 am Follow  CBC  INFECTIOUS A: Possible LLL aspiration PNA P:   empiric abx as above  ENDOCRINE A: Hypoglycemia P:   SSI coverage on TF  NEUROLOGIC A: Dementia P:   Causing major concerns about prognosis / goals of care    TODAY'S SUMMARY: 82 y/o woman s/p respiratory and cardiac arrest, resolved.   Family discussion: Dr Ancil Linsey >> 5/5/ discussed at length (60 minutes or more) the patient's situation with her daughter, who was acting somewhat odd -- speaking in a smooth, conciliatory manner, clearly projecting an image of one who was undisturbed by the poor prognosis for her mother. She openly admitted that she was not letting "any negativity into the room" because if she allowed herself to do so, she would "fall apart." I spent some time exploring the daughters emotions and motivations for the decisions which brought her mother to where she is tonight, and she stated that she believed her mother hadn't given up on life yet, and that she would keep fighting for her mother to live until she (the patient) had given up the fight. I tried to tease out what that may look like, but the daughter was unable to converse further on this. I explained that regardless of outlook, attitude, or action, she and the MD teams have no choice but to determine what the end of her life will be like (ICU vs palliative). She seemed to recognize this, and indicated she was unafraid of the ICU, and I explained that she was likely to receive a lot of concern from the medical and nursing staff on the human dignity costs of her decision to continue aggressive care. Still, no plans were made. The daughter did relate multiple stories indicating her desire to be calm and work with the medical teams, but occasional made statements that sounded threatening ("if you had said that [something negative about prognosis], you'd wind up here [in the emergency room"). I cautioned her not to make threatening statements, or ones that could be  perceived as threatening, as it would greatly interfere with her ability to work with the medical team to provide good care for her mother. She denied making any threats, and tried unconvincingly to explain the threat as a misunderstanding  about seeing someone tripping in the hallway.  Family 5/6: RB discussed status, guarded prognosis with daughter and son at bedside. Somewhat strange affect. Informed them that neuro recovery appears absent but that we need another 24h to assess for interval improvement. I did explain that if she is unable to follow commands 5/7 then chances for recovery to baseline are poor. Did not broach DNR status given wishes expresses to Dr Ancil Linsey last night.    5/7 > explained to her son at bedside that she was improved, that I believe she has a chance for a possible successful extubation. I advocated an extubation without plan for reintubation if she fails. Not clear to me that he would be ready to commit to that plan. We would need to discuss with him and his sister.   Independent CC time 35 minutes  Baltazar Apo, MD, PhD 02/03/2016, 12:20 PM [facility] [phone] or if no answer [phone]

## 2016-02-03 NOTE — Telephone Encounter (Signed)
Ok to renew meds Thx 

## 2016-02-04 ENCOUNTER — Encounter (HOSPITAL_COMMUNITY): Payer: Self-pay

## 2016-02-04 DIAGNOSIS — L8990 Pressure ulcer of unspecified site, unspecified stage: Secondary | ICD-10-CM

## 2016-02-04 DIAGNOSIS — J9600 Acute respiratory failure, unspecified whether with hypoxia or hypercapnia: Secondary | ICD-10-CM | POA: Insufficient documentation

## 2016-02-04 LAB — GLUCOSE, CAPILLARY
GLUCOSE-CAPILLARY: 104 mg/dL — AB (ref 65–99)
GLUCOSE-CAPILLARY: 121 mg/dL — AB (ref 65–99)
GLUCOSE-CAPILLARY: 94 mg/dL (ref 65–99)
GLUCOSE-CAPILLARY: 96 mg/dL (ref 65–99)
Glucose-Capillary: 88 mg/dL (ref 65–99)
Glucose-Capillary: 111 mg/dL — ABNORMAL HIGH (ref 65–99)

## 2016-02-04 LAB — TYPE AND SCREEN
ABO/RH(D): O POS
ANTIBODY SCREEN: NEGATIVE
UNIT DIVISION: 0

## 2016-02-04 LAB — CBC
HCT: 24.5 % — ABNORMAL LOW (ref 36.0–46.0)
HEMOGLOBIN: 7.7 g/dL — AB (ref 12.0–15.0)
MCH: 29.2 pg (ref 26.0–34.0)
MCHC: 31.4 g/dL (ref 30.0–36.0)
MCV: 92.8 fL (ref 78.0–100.0)
Platelets: 209 10*3/uL (ref 150–400)
RBC: 2.64 MIL/uL — ABNORMAL LOW (ref 3.87–5.11)
RDW: 20.4 % — AB (ref 11.5–15.5)
WBC: 7.2 10*3/uL (ref 4.0–10.5)

## 2016-02-04 LAB — BASIC METABOLIC PANEL
ANION GAP: 14 (ref 5–15)
BUN: 147 mg/dL — ABNORMAL HIGH (ref 6–20)
CALCIUM: 9.4 mg/dL (ref 8.9–10.3)
CO2: 19 mmol/L — ABNORMAL LOW (ref 22–32)
Chloride: 116 mmol/L — ABNORMAL HIGH (ref 101–111)
Creatinine, Ser: 1.98 mg/dL — ABNORMAL HIGH (ref 0.44–1.00)
GFR calc Af Amer: 26 mL/min — ABNORMAL LOW (ref >60)
GFR, EST NON AFRICAN AMERICAN: 22 mL/min — AB (ref >60)
GLUCOSE: 104 mg/dL — AB (ref 65–99)
Potassium: 3.7 mmol/L (ref 3.5–5.1)
SODIUM: 149 mmol/L — AB (ref 135–145)

## 2016-02-04 LAB — ABO/RH: ABO/RH(D): O POS

## 2016-02-04 MED ORDER — TRAMADOL HCL 50 MG PO TABS: ORAL_TABLET | ORAL | Status: AC

## 2016-02-04 MED ORDER — LORAZEPAM 1 MG PO TABS
1.0000 mg | ORAL_TABLET | Freq: Once | ORAL | Status: AC
  Administered 2016-02-04: 1 mg via ORAL
  Filled 2016-02-04: qty 1

## 2016-02-04 MED ORDER — DEXTROSE 5 % IV SOLN
2.0000 g | INTRAVENOUS | Status: DC
  Administered 2016-02-04 – 2016-02-05 (×2): 2 g via INTRAVENOUS
  Filled 2016-02-04 (×4): qty 2

## 2016-02-04 MED ORDER — DEXAMETHASONE 4 MG PO TABS: 4.0000 mg | ORAL_TABLET | Freq: Every day | ORAL | Status: AC

## 2016-02-04 NOTE — Progress Notes (Signed)
Pt bedpad noted to residual urine from leaking foley catheter. Adjusted foley at this time and reinstilled 10cc sterile water. Will continue to monitor pt.

## 2016-02-04 NOTE — Progress Notes (Signed)
PULMONARY / CRITICAL CARE MEDICINE HISTORY AND PHYSICAL EXAMINATION   Name: Victoria [name] MRN: [id number] DOB: June 10, 1932    ADMISSION DATE:  02/24/2016  PRIMARY SERVICE: PCCM  CHIEF COMPLAINT:  Cardiac arrest  BRIEF PATIENT DESCRIPTION: 82 y/o woman with dementia, MM, FTT, on hospice presenting w/ respiratory -> Cardiac arrest 2/2 aspiration  SIGNIFICANT EVENTS / STUDIES:  - Bradycardia -> arrest in field, ROSC obtained after 6 mins, epi x1 - Intubated in ED  LINES / TUBES: - ETT 5/5 - OGT 5/5 - Portacath (Oct '16)  CULTURES: Blood 5/5>>no growth 5/7 MRSA PCR 5/6>>positive  ANTIBIOTICS: Vanc 5/5>> Cefepime 5/6>> Zosyn  5/5 >> 5/6  HISTORY OF PRESENT ILLNESS:   Victoria [name] is a 82 y/o woman with advanced dementia, MM, and failure to thrive who presents to the ED via EMS after a cardiac arrest at home. The patient's daughter and caregiver report that over the past week her functional status has declined, and has remained bed ridden for the last day or so. Yesterday she was unable to take food by mouth, and when she tried to swallow soft foods, they didn't go down, and came out of her mouth (without a cough) after a minute or so. Today her family tried to give her liquids (broths, ensure, etc) but she didn't swallow much. She was minimally responsive to family. She made some gurgling sounds, and then later was found to be apnic. EMS was called, and the family member began rescue breathing. Upon arrival, the patient was in a wide complex rhythm, and then developed bradycardia and then PEA. CPR was begun, and after six minutes of CPR with epi x1, ROSC was obtained. She was taken to the ED and PCCM was called for admission.  SUBJECTIVE / interval events:   Pt intubated facial grimacing with painful stimulation. Spoke with RN pt does not follow simple commands Tolerating PSV  VITAL SIGNS: Temp:  [98.8 F (37.1 C)-100.1 F (37.8 C)] 99.3 F (37.4 C) (05/08 0720) Pulse Rate:  [90-134]  124 (05/08 0908) Resp:  [17-48] 24 (05/08 0908) BP: (104-170)/(57-83) 170/70 mmHg (05/08 0900) SpO2:  [80 %-100 %] 94 % (05/08 0908) FiO2 (%):  [40 %] 40 % (05/08 0800) Weight:  [78 lb 4.2 oz (35.5 kg)] 78 lb 4.2 oz (35.5 kg) (05/08 0600) HEMODYNAMICS:   VENTILATOR SETTINGS: Vent Mode:  [-] PSV;CPAP FiO2 (%):  [40 %] 40 % Set Rate:  [10 bmp] 10 bmp Vt Set:  [340 mL] 340 mL PEEP:  [5 cmH20-12 cmH20] 5 cmH20 Plateau Pressure:  [15 cmH20] 15 cmH20 INTAKE / OUTPUT: Intake/Output      05/07 0701 - 05/08 0700 05/08 0701 - 05/09 0700   I.V. (mL/kg) 1200 (33.8) 100 (2.8)   Blood 335    NG/GT 900 80   IV Piggyback 150    Total Intake(mL/kg) 2585 (72.8) 180 (5.1)   Urine (mL/kg/hr) 810 (1)    Stool 0 (0)    Total Output 810     Net +1775 +180        Stool Occurrence 3 x      PHYSICAL EXAMINATION: General:  Extremely cachectic woman on ventilator Neuro:  Withdraws from painful stimulation, not following commands, +corneal reflexes, + gag and cough HEENT:  Dry MM Neck: No LAD Cardiovascular:  S1S2, RRR, sinus tach  Lungs: diminished breath sounds throughout, even non labored Abdomen:  Soft, sacphoid Musculoskeletal:  Extremely reduced muscle bulk Skin:  No rashes  LABS:  CBC  Recent  Labs Lab 02/03/16 0545 02/03/16 1248 02/04/16 0515  WBC 7.4 7.4 7.2  HGB 5.7* 7.6* 7.7*  HCT 19.1* 24.8* 24.5*  PLT 241 233 209   Coag's  Recent Labs Lab 02/16/2016 1924  APTT 30  INR 1.64*   BMET  Recent Labs Lab 02/02/16 0440 02/03/16 0415 02/04/16 0515  NA 148* 145 149*  K 4.0 3.8 3.7  CL 110 117* 116*  CO2 28 19* 19*  BUN 73* 102* 147*  CREATININE 1.18* 1.62* 1.98*  GLUCOSE 108* 83 104*   Electrolytes  Recent Labs Lab 02/02/16 0440 02/03/16 0415 02/04/16 0515  CALCIUM 11.5* 9.1 9.4  MG 1.7  --   --   PHOS 5.8*  --   --    Sepsis Markers  Recent Labs Lab 02/03/2016 1936  LATICACIDVEN 7.93*   ABG  Recent Labs Lab 01/30/2016 1929 02/02/16 0327  PHART  7.424 7.490*  PCO2ART 41.4 37.3  PO2ART 371.0* 101.0*   Liver Enzymes No results for input(s): AST, ALT, ALKPHOS, BILITOT, ALBUMIN in the last 168 hours. Cardiac Enzymes No results for input(s): TROPONINI, PROBNP in the last 168 hours. Glucose  Recent Labs Lab 02/03/16 1149 02/03/16 1541 02/03/16 2003 02/03/16 2351 02/04/16 0343 02/04/16 0742  GLUCAP 102* 89 75 121* 94 88    Imaging Dg Chest Port 1 View  02/03/2016  CLINICAL DATA:  Hypoxia EXAM: PORTABLE CHEST 1 VIEW COMPARISON:  Feb 02, 2016 FINDINGS: Endotracheal tube tip is 4.5 cm above the carina. Nasogastric tube tip and side port are in the stomach. Port-A-Cath tip is in the superior vena cava. No pneumothorax. There is a new focal area of consolidation in the right base. Patchy airspace opacity in the left lower lobe is stable. The heart size and pulmonary vascularity are normal. There is atherosclerotic calcification in the aorta. No adenopathy. IMPRESSION: Tube and catheter positions as described without pneumothorax. New area of focal consolidation right base. Stable airspace opacity left lower lobe. Suspect bibasilar pneumonia. Stable cardiac silhouette. Electronically Signed   By: Lowella Grip III M.D.   On: 02/03/2016 07:23    EKG: Sinus rhythm CXR: R mainstem ETT, no infiltrate. Elev. R hemidiaphragm.   ASSESSMENT / PLAN:  Active Problems:   Cardiac arrest (HCC)   Pressure ulcer   PULMONARY A: Aspiration leading to hypoxia, then cardiac arrest. Need for mechanical ventilation P:   Wean cpap 5 ps 5, goal 30 min Reintubation would be futile and will NOT be offered No pcxr required  CARDIOVASCULAR A: S/p cardiac arrest, suspected etiology of hypoxia P:   Tele crp , acls would be futile and medically ineffective  RENAL A: Acute on chronic renal failure Hyperkalemia-resolved P:   Has had very poor PO intake for some time Gentle hydration MIV of NS at 50/hr, plus free water of 100 q6h Monitor  uop Follow BMP's  GASTROINTESTINAL A: Marked malnutrition P:   Continue TF  HEMATOLOGIC A: Anemia dvt prevention P:   Suspect chronic disease no signs of active bleeding, malnutrition, etc. Dominant factors S/p PRBC 1u on 5/7 am  INFECTIOUS A: Possible LLL aspiration PNA P:   Narrow abx Dc vanc Dc cefepime Add ceftraixone , total 7 days   ENDOCRINE A: Hypoglycemia P:   SSI coverage on TF  NEUROLOGIC A: Dementia terminal P:   Causing major concerns about prognosis / goals of care    TODAY'S SUMMARY: 82 y/o woman s/p respiratory and cardiac arrest, resolved. Will discuss goals of treatment with family once  they arrive   Marda Stalker, [place]  P[facility]/Critical Care   STAFF NOTE: I, Merrie Roof, MD FACP have personally reviewed patient's available data, including medical history, events of note, physical examination and test results as part of my evaluation. I have discussed with resident/NP and other care providers such as pharmacist, RN and RRT. In addition, I personally evaluated patient and elicited key findings of: WD pain stimulation, per, ronchi exam, pcxr c/w chronic changes and asp pna, terminal dementia, MM, hospice care, now arrest, poor prognosis, ACLS, HD, escalation of vent, pressors, heroics of any kind would be futile and medically ineffective therapy, ARF, add free water d5w, bmet in am for na, no need repeat cbc pcxr, weaning 8/5, to goal ps 5/5, will meet family and recommend comfort care, if they refuse i will institute futility policy and offer transfer, if unabel then will seek ethic s and pall care consults, narrow abx as culture neg, give 7 days abx coevarge The patient is critically ill with multiple organ systems failure and requires high complexity decision making for assessment and support, frequent evaluation and titration of therapies, application of advanced monitoring technologies and extensive interpretation of multiple databases.    Critical Care Time devoted to patient care services described in this note is 30 Minutes. This time reflects time of care of this signee: Merrie Roof, MD FACP. This critical care time does not reflect procedure time, or teaching time or supervisory time of PA/NP/Med student/Med Resident etc but could involve care discussion time. Rest per NP/medical resident whose note is outlined above and that I agree with   Lavon Paganini. Titus Mould, MD, [place] Pgr: [facility] 02/04/2016 10:05 AM

## 2016-02-04 NOTE — Progress Notes (Signed)
[facility] Work note Patient was admitted to hospital and has been intubated. LCSW and liaison RNs, Lanetta Inch and Collie Siad met briefly with daughter, Ebony Hail as she arrived to see patient. LCSW reviewed role of hospice during hospital stay and daughter voiced surprise that agency knew her mother was at the hospital. She questioned role and reason for visit. Daughter voiced no concerns and stated that her brother was en route to the hospital and he is unaware that patient is on hospice. LCSW voiced plan for ongoing follow up as needed.  Katherina Right, [place]

## 2016-02-04 NOTE — Progress Notes (Signed)
eLink Physician-Brief Progress Note Patient Name: MAKINZEY SIMPKINS DOB: May 10, 1932 MRN: [id number]   Date of Service  02/04/2016  HPI/Events of Note  RN calls as pt's daughter was adamant that pt receives Ativan 1 mg at [facility] which is her usual dose.  Pt is on prn versed and fenatnyl. Nurse has been explaining to daughter re: versed use rather than ativan. Daughter does not understand reasoning. SHE INSISTS PT BE PLACED ON ATIVAN. Daughter states fentanyl is too much for pt and versed is too much as well.   eICU Interventions  Will keep fentanyl and versed prn as ordered. Will give 1 time dose of Ativan 1 mg now.  Pt is being weaned so difficult to place on scheduled ativan. Per nurse, pt easily gets agitated and fentanyl makes her sleepy.   Need to address sedation and goals of care in am. ? Maybe keep him on ativan 1 mg at HS and keep versed prn.      Intervention Category Intermediate Interventions: Other:  [place] 02/04/2016, 1:19 AM

## 2016-02-04 NOTE — Telephone Encounter (Signed)
Called Victoria [name]  No answer LMOM MD ok refill have been sent to [facility].../lmb

## 2016-02-04 NOTE — Progress Notes (Signed)
2H-06-[facility]-GIP RN Visit  This is a related, covered GIP admission from 02/06/2016 to [facility] diagnosis of Multiple Myeloma, per Dr. Tomasa Hosteller.  Patient is a FULL CODE.  Patient admitted after a cardiac arrest, ROSC obtained after 6 minutes and epi x 1.  Patient was intubated in ED and remains intubated. Patient seen in room, laying in bed.  Patient unresponsive to verbal stimuli.  Patient remains intubated.  Per staff RN, patient has been unable to follow simple commands.  Patient currently receiving IVF via a Rt chest port.  She is receiving IV Rocephin Q 24 hours.  Per chart review, patient has received 2 doses of 50 mcg Fentanyl IV to maintain RASS goal. Patient also receiving tube feedings at rate of 25 ml/hr via a Lt nare NG tube inserted 5/5. Spoke to daughter, Ebony Hail at bedside to explain role of hospice during hospital stay and offer support. Daughter stated she would be meeting with hospital MD tomorrow to further discuss plan of care.  She stated the reason her mother is not responding at this time is because she is tired. Daughter inquired how hospice knew her mother was hospitalized and stated she was unaware we would follow the patient during the hospital stay.  Explained that the home care RN asked for [place] to follow up, and we would be available for any hospice-related questions or concerns.  Provided contact information for daughter.    Updated medication list and transfer summary placed on chart.  Please call with any hospice-related questions or concerns.  Thank You, [facility] Liaison [phone]

## 2016-02-04 NOTE — Progress Notes (Signed)
Pt bathed at this time, found saturated bedpad. Pt foley is leaking, 2 cc water added. Urine output has been minimal but most likely due to foley leaking. RN will continue to monitor.

## 2016-02-04 NOTE — Progress Notes (Signed)
Paged pt daughter, Rettie Barreto for a family meeting per Dr Titus Mould. Ebony Hail will arrive to hospital in 1 hour. Will page MD when daughter Ebony Hail arrives.

## 2016-02-05 ENCOUNTER — Ambulatory Visit: Payer: Self-pay | Admitting: Internal Medicine

## 2016-02-05 ENCOUNTER — Telehealth: Payer: Self-pay | Admitting: *Deleted

## 2016-02-05 LAB — BASIC METABOLIC PANEL
ANION GAP: 14 (ref 5–15)
BUN: 163 mg/dL — ABNORMAL HIGH (ref 6–20)
CO2: 19 mmol/L — AB (ref 22–32)
Calcium: 9 mg/dL (ref 8.9–10.3)
Chloride: 117 mmol/L — ABNORMAL HIGH (ref 101–111)
Creatinine, Ser: 1.98 mg/dL — ABNORMAL HIGH (ref 0.44–1.00)
GFR calc Af Amer: 26 mL/min — ABNORMAL LOW (ref >60)
GFR calc non Af Amer: 22 mL/min — ABNORMAL LOW (ref >60)
GLUCOSE: 109 mg/dL — AB (ref 65–99)
POTASSIUM: 3.4 mmol/L — AB (ref 3.5–5.1)
Sodium: 150 mmol/L — ABNORMAL HIGH (ref 135–145)

## 2016-02-05 LAB — CBC
HCT: 24.9 % — ABNORMAL LOW (ref 36.0–46.0)
HEMOGLOBIN: 7.6 g/dL — AB (ref 12.0–15.0)
MCH: 27.8 pg (ref 26.0–34.0)
MCHC: 30.5 g/dL (ref 30.0–36.0)
MCV: 91.2 fL (ref 78.0–100.0)
Platelets: 199 10*3/uL (ref 150–400)
RBC: 2.73 MIL/uL — AB (ref 3.87–5.11)
RDW: 20.1 % — ABNORMAL HIGH (ref 11.5–15.5)
WBC: 4.3 10*3/uL (ref 4.0–10.5)

## 2016-02-05 LAB — GLUCOSE, CAPILLARY
GLUCOSE-CAPILLARY: 137 mg/dL — AB (ref 65–99)
Glucose-Capillary: 107 mg/dL — ABNORMAL HIGH (ref 65–99)
Glucose-Capillary: 111 mg/dL — ABNORMAL HIGH (ref 65–99)
Glucose-Capillary: 112 mg/dL — ABNORMAL HIGH (ref 65–99)

## 2016-02-05 MED ORDER — MORPHINE SULFATE 25 MG/ML IV SOLN
10.0000 mg/h | INTRAVENOUS | Status: DC
  Administered 2016-02-05: 10 mg/h via INTRAVENOUS
  Filled 2016-02-05 (×2): qty 10

## 2016-02-05 MED ORDER — MORPHINE BOLUS VIA INFUSION
5.0000 mg | INTRAVENOUS | Status: DC | PRN
  Filled 2016-02-05: qty 20

## 2016-02-05 MED ORDER — POTASSIUM CHLORIDE 20 MEQ/15ML (10%) PO SOLN
40.0000 meq | Freq: Once | ORAL | Status: AC
  Administered 2016-02-05: 40 meq via ORAL
  Filled 2016-02-05: qty 30

## 2016-02-05 MED ORDER — DEXTROSE 5 % IV SOLN
INTRAVENOUS | Status: DC
Start: 2016-02-05 — End: 2016-02-09
  Administered 2016-02-07: 1 mL via INTRAVENOUS

## 2016-02-05 MED ORDER — POTASSIUM CHLORIDE CRYS ER 20 MEQ PO TBCR: 40.0000 meq | EXTENDED_RELEASE_TABLET | Freq: Once | ORAL | Status: DC

## 2016-02-05 MED ORDER — DEXTROSE 5 % IV SOLN
INTRAVENOUS | Status: DC
  Administered 2016-02-05: 100 mL via INTRAVENOUS

## 2016-02-05 NOTE — Progress Notes (Signed)
[facility] Work note LCSW met with daughter, Ebony Hail to discuss revocation from hospice services. Daughter no longer wants hospice involved and wants no further visits. Daughter signed Revocation paperwork and hospice will no longer be following patient as of 02/05/16. Katherina Right, [place]

## 2016-02-05 NOTE — Progress Notes (Signed)
Chaplain presented to the patient's room to provide spiritual care support for the patient and family.  Upon entering the room, introduction as Chaplain was made, and the daughter who was present introduced herself as well. The daughter informed this Chaplain that her pastor had just left visiting them providing prayer, and encouragement.  Chaplain offered a listening presence as the daughter spoke of her's mother's present condition, and having unanswered questions.  Chaplain asked the daughter if she is seeking answers from God regarding her Mother's condition, which she considered it a though provoking  Question, which she did not answer. Chaplain informed her Chaplain's are in the hospital should she need any assistance. Follow up will continue as needed. Chaplain Yaakov Guthrie [phone]

## 2016-02-05 NOTE — Progress Notes (Signed)
PULMONARY / CRITICAL CARE MEDICINE HISTORY AND PHYSICAL EXAMINATION   Name: Victoria [name] MRN: [id number] DOB: Jan 15, 1932    ADMISSION DATE:  02/17/2016  PRIMARY SERVICE: PCCM  CHIEF COMPLAINT:  Cardiac arrest  BRIEF PATIENT DESCRIPTION: 82 y/o woman with dementia, MM (oncology recommended full comfort), FTT, on hospice presenting w/ respiratory -> Cardiac arrest 2/2 aspiration  SIGNIFICANT EVENTS / STUDIES:  - Bradycardia -> arrest in field, ROSC obtained after 6 mins, epi x1 - Intubated in ED -remains agitated   LINES / TUBES: - ETT 5/5 - OGT 5/5 - Portacath (Oct '16)  CULTURES: Blood 5/5>>no growth 5/7 MRSA PCR 5/6>>positive  ANTIBIOTICS: Vanc 5/5>> Cefepime 5/6>> Zosyn  5/5 >> 5/6  HISTORY OF PRESENT ILLNESS:   Ms. Victoria [name] is a 82 y/o woman with advanced dementia, MM, and failure to thrive who presents to the ED via EMS after a cardiac arrest at home. The patient's daughter and caregiver report that over the past week her functional status has declined, and has remained bed ridden for the last day or so. Yesterday she was unable to take food by mouth, and when she tried to swallow soft foods, they didn't go down, and came out of her mouth (without a cough) after a minute or so. Today her family tried to give her liquids (broths, ensure, etc) but she didn't swallow much. She was minimally responsive to family. She made some gurgling sounds, and then later was found to be apnic. EMS was called, and the family member began rescue breathing. Upon arrival, the patient was in a wide complex rhythm, and then developed bradycardia and then PEA. CPR was begun, and after six minutes of CPR with epi x1, ROSC was obtained. She was taken to the ED and PCCM was called for admission.  SUBJECTIVE / interval events:   not awake, responds to pain  VITAL SIGNS: Temp:  [97.5 F (36.4 C)-100.2 F (37.9 C)] 97.5 F (36.4 C) (05/09 0800) Pulse Rate:  [71-113] 94 (05/09 0800) Resp:  [15-32] 18  (05/09 0800) BP: (111-157)/(61-85) 156/72 mmHg (05/09 0800) SpO2:  [97 %-100 %] 100 % (05/09 0800) FiO2 (%):  [40 %] 40 % (05/09 0711) Weight:  [34 kg (74 lb 15.3 oz)] 34 kg (74 lb 15.3 oz) (05/09 0331) HEMODYNAMICS:   VENTILATOR SETTINGS: Vent Mode:  [-] CPAP;PSV FiO2 (%):  [40 %] 40 % Set Rate:  [10 bmp] 10 bmp Vt Set:  [340 mL] 340 mL PEEP:  [5 cmH20-8 cmH20] 5 cmH20 Pressure Support:  [8 cmH20] 8 cmH20 Plateau Pressure:  [9 cmH20-15 cmH20] 9 cmH20 INTAKE / OUTPUT: Intake/Output      05/08 0701 - 05/09 0700 05/09 0701 - 05/10 0700   P.O.  0   I.V. (mL/kg) 1200 (35.3) 50 (1.5)   Blood     NG/GT 1040 25   IV Piggyback 50    Total Intake(mL/kg) 2290 (67.4) 75 (2.2)   Urine (mL/kg/hr) 910 (1.1)    Stool 0 (0)    Total Output 910     Net +1380 +75        Urine Occurrence 2 x    Stool Occurrence 1 x 1 x     PHYSICAL EXAMINATION: General:  Extremely cachectic woman on ventilator Neuro:  Withdraws from painful stimulation, not following commands, +corneal reflexes, + gag and cough- no changes HEENT:  Dry MM Neck: ett, secretions thick Cardiovascular:  S1S2, RRR, sinus tach mild Lungs: cta anterior, reduced bases Abdomen:  Soft, sacphoid, no  r/g Musculoskeletal:  Extremely reduced muscle bulk Skin:  No rashes  LABS:  CBC  Recent Labs Lab 02/03/16 1248 02/04/16 0515 02/05/16 0350  WBC 7.4 7.2 4.3  HGB 7.6* 7.7* 7.6*  HCT 24.8* 24.5* 24.9*  PLT 233 209 199   Coag's  Recent Labs Lab 02/25/2016 1924  APTT 30  INR 1.64*   BMET  Recent Labs Lab 02/03/16 0415 02/04/16 0515 02/05/16 0347  NA 145 149* 150*  K 3.8 3.7 3.4*  CL 117* 116* 117*  CO2 19* 19* 19*  BUN 102* 147* 163*  CREATININE 1.62* 1.98* 1.98*  GLUCOSE 83 104* 109*   Electrolytes  Recent Labs Lab 02/02/16 0440 02/03/16 0415 02/04/16 0515 02/05/16 0347  CALCIUM 11.5* 9.1 9.4 9.0  MG 1.7  --   --   --   PHOS 5.8*  --   --   --    Sepsis Markers  Recent Labs Lab 01/28/2016 1936   LATICACIDVEN 7.93*   ABG  Recent Labs Lab 02/06/2016 1929 02/02/16 0327  PHART 7.424 7.490*  PCO2ART 41.4 37.3  PO2ART 371.0* 101.0*   Liver Enzymes No results for input(s): AST, ALT, ALKPHOS, BILITOT, ALBUMIN in the last 168 hours. Cardiac Enzymes No results for input(s): TROPONINI, PROBNP in the last 168 hours. Glucose  Recent Labs Lab 02/04/16 1122 02/04/16 1647 02/04/16 2013 02/05/16 0022 02/05/16 0345 02/05/16 0803  GLUCAP 104* 111* 96 107* 111* 112*    Imaging No results found.  EKG: Sinus rhythm CXR: R mainstem ETT, no infiltrate. Elev. R hemidiaphragm.   ASSESSMENT / PLAN:  Active Problems:   Cardiac arrest ([place])   Pressure ulcer   Acute respiratory failure (HCC)   PULMONARY A: Aspiration leading to hypoxia, then cardiac arrest. Need for mechanical ventilation P:   Wean cpap 5 ps 5, goal 30 min Reintubation would be futile and will NOT be offered Suction secretions Required PS higher then 10  CARDIOVASCULAR A: S/p cardiac arrest, suspected etiology of hypoxia P:   Tele crp , acls would be futile and medically ineffective  RENAL A: Acute on chronic renal failure Hyperkalemia hypernatremia P:   d5w increase, k supp Monitor uop Follow BMP's  GASTROINTESTINAL A: Marked malnutrition P:   Continue TF Ppi Monitor BM  HEMATOLOGIC A: Anemia dvt prevention P:   scd limit phlebotomy  INFECTIOUS A: Possible LLL aspiration PNA P:   ceftraixone , total 7 days then allow this to dc  ENDOCRINE A: Hypoglycemia P:   SSI coverage on TF  NEUROLOGIC A: Dementia terminal P:   Causing major concerns about prognosis / goals of care fent prn She requires morphine and comfort  See additional notes from meeting  Ccm time 35 min without meeting time  Lavon Paganini. Titus Mould, MD, [place] Pgr: [facility] 02/05/2016 10:43 AM

## 2016-02-05 NOTE — Progress Notes (Signed)
[facility] Progress Note Patient Name: Victoria [name] DOB: 27-Dec-1931 MRN: [id number]   Date of Service  02/05/2016  HPI/Events of Note  k 3.4  eICU Interventions  kdur 40 meqs     Intervention Category Intermediate Interventions: Electrolyte abnormality - evaluation and management  [place] 02/05/2016, 7:07 AM

## 2016-02-05 NOTE — Progress Notes (Signed)
I have had extensive discussions with family kids. We discussed patients current circumstances and organ failures. We also discussed patient's prior wishes under circumstances such as this. Family has decided to NOT perform resuscitation and comfort wed evening  No escallation They also agree to start comfort meds now [place] Titus Mould, MD, [place] Pgr: [facility]

## 2016-02-05 NOTE — Telephone Encounter (Signed)
Collie Siad from [facility] called to notify Dr. Alvy Bimler that pt's daughter has revoked Hospice Services.  Daughter called 9 to have pt taken to hospital and pt is currently intubated with feeding tube.

## 2016-02-05 NOTE — Progress Notes (Signed)
Patient's daughter insisted that patient was very agitated. Upon assessment, patient did appear to be trying to reach up for the tube. Daughter adamant about only giving patient 25 mcg of Fentanyl at this time. Later, patient's daughter requested 38mcg of fentanyl again for increased agitation. Will continue to monitor patient. Gildardo Cranker, RN

## 2016-02-05 NOTE — Progress Notes (Signed)
CRITICAL VALUE ALERT  Critical value received:  Potassium  Date of notification:  02/05/2016  Time of notification:  0600  Critical value read back:yes  Nurse who received alert: Carmell Austria RN  MD notified (1st page):  Elink  Time of first page:  0635  Responding MD: Warren Lacy  Time MD responded:  [phone]

## 2016-02-06 ENCOUNTER — Telehealth: Payer: Self-pay | Admitting: *Deleted

## 2016-02-06 MED ORDER — MORPHINE SULFATE (PF) 2 MG/ML IV SOLN
1.0000 mg | INTRAVENOUS | Status: DC | PRN
  Filled 2016-02-06: qty 1

## 2016-02-06 NOTE — Progress Notes (Signed)
Nutrition Brief Note  Noted TF d/c'ed 5/9. Chart reviewed. Per MD notes plan to transition to comfort care with one-way extubation this afternoon after family arrives.  No further nutrition interventions warranted at this time.  Please re-consult as needed.   [place], [place], [place] Pager [phone] After Hours Pager

## 2016-02-06 NOTE — Progress Notes (Signed)
Previous plan discussed with the healthcare team was to extubate and withdraw care during the day today. Pt's daughter did not set a specific time and stated that she was waiting for family members to get off work.  This evening the pt's daughter stated, "I am not comfortable with my mother being extubated tonight because they did not listen to me about titrating the morphine and only stopped giving it at 10am this morning. With the research I have done that is not enough time for it to fully exit her system. On top of that she is smaller, so it takes about twice as long for it to be flushed out. Everyone is going home now, and I am not comfortable extubating tonight because I want to give her the best chance possible."   Discussed past plans and current opinion of daughter with MD in the box. Plan now is to wait until morning, and only give IV morphine if the patient displays discomfort. MD rounding in the morning will have another discussion with the family.   It appears that the daughter is confused about the plan of care and what extubation will mean for her mother at this point.

## 2016-02-06 NOTE — Progress Notes (Signed)
PULMONARY / CRITICAL CARE MEDICINE HISTORY AND PHYSICAL EXAMINATION   Name: Victoria [name] MRN: [id number] DOB: 1932-07-29    ADMISSION DATE:  02/10/2016  PRIMARY SERVICE: PCCM  CHIEF COMPLAINT:  Cardiac arrest  BRIEF PATIENT DESCRIPTION: 82 y/o woman with dementia, MM (oncology recommended full comfort), FTT, on hospice presenting w/ respiratory -> Cardiac arrest 2/2 aspiration  SIGNIFICANT EVENTS / STUDIES:  - Bradycardia -> arrest in field, ROSC obtained after 6 mins, epi x1 - Intubated in ED -remains agitated   LINES / TUBES: - ETT 5/5 - OGT 5/5 - Portacath (Oct '16)  CULTURES: Blood 5/5>>no growth 5/9 MRSA PCR 5/6>>positive  ANTIBIOTICS: Vanc 5/5>> Cefepime 5/6>> Zosyn  5/5 >> 5/6  HISTORY OF PRESENT ILLNESS:   Victoria [name] is a 82 y/o woman with advanced dementia, MM, and failure to thrive who presents to the ED via EMS after a cardiac arrest at home. The patient's daughter and caregiver report that over the past week her functional status has declined, and has remained bed ridden for the last day or so. Yesterday she was unable to take food by mouth, and when she tried to swallow soft foods, they didn't go down, and came out of her mouth (without a cough) after a minute or so. Today her family tried to give her liquids (broths, ensure, etc) but she didn't swallow much. She was minimally responsive to family. She made some gurgling sounds, and then later was found to be apnic. EMS was called, and the family member began rescue breathing. Upon arrival, the patient was in a wide complex rhythm, and then developed bradycardia and then PEA. CPR was begun, and after six minutes of CPR with epi x1, ROSC was obtained. She was taken to the ED and PCCM was called for admission.  SUBJECTIVE / interval events:  Pt responds to pain only.  Spoke with RN she stated pt has rested comfortably on Morphine drip at 10 mg/hr.  Daughter at bedside she is concerned her mother is too sedated and would  like to discuss morphine dose.  She also would like tube feedings to be reinitiated until terminal wean is inititated  VITAL SIGNS: Temp:  [97.8 F (36.6 C)-99.5 F (37.5 C)] 99.1 F (37.3 C) (05/10 0900) Pulse Rate:  [67-112] 72 (05/10 0900) Resp:  [9-26] 11 (05/10 0900) BP: (78-144)/(44-72) 84/45 mmHg (05/10 0900) SpO2:  [97 %-100 %] 100 % (05/10 0900) FiO2 (%):  [40 %] 40 % (05/10 0843) Weight:  [83 lb 5.3 oz (37.8 kg)] 83 lb 5.3 oz (37.8 kg) (05/10 0500) HEMODYNAMICS:   VENTILATOR SETTINGS: Vent Mode:  [-] PRVC FiO2 (%):  [40 %] 40 % Set Rate:  [10 bmp] 10 bmp Vt Set:  [340 mL] 340 mL PEEP:  [5 cmH20] 5 cmH20 Pressure Support:  [8 cmH20] 8 cmH20 Plateau Pressure:  [10 cmH20-19 cmH20] 10 cmH20 INTAKE / OUTPUT: Intake/Output      05/09 0701 - 05/10 0700 05/10 0701 - 05/11 0700   P.O. 0    I.V. (mL/kg) 550 (14.6) 40 (1.1)   NG/GT 125    IV Piggyback     Total Intake(mL/kg) 675 (17.9) 40 (1.1)   Urine (mL/kg/hr) 475 (0.5)    Stool 0 (0)    Total Output 475     Net +200 +40        Stool Occurrence 1 x      PHYSICAL EXAMINATION: General:  Extremely cachectic woman on ventilator Neuro:  Withdraws from painful stimulation, not  following commands, +corneal reflexes, + gag and cough- no changes HEENT:  Dry MM Neck: ett, secretions thick Cardiovascular:  S1S2, RRR, sinus rhythm  Lungs: cta anterior, diminished throughout Abdomen:  Soft, sacphoid, no r/g Musculoskeletal:  Extremely reduced muscle bulk Skin:  No rashes  LABS:  CBC  Recent Labs Lab 02/03/16 1248 02/04/16 0515 02/05/16 0350  WBC 7.4 7.2 4.3  HGB 7.6* 7.7* 7.6*  HCT 24.8* 24.5* 24.9*  PLT 233 209 199   Coag's  Recent Labs Lab 02/07/2016 1924  APTT 30  INR 1.64*   BMET  Recent Labs Lab 02/03/16 0415 02/04/16 0515 02/05/16 0347  NA 145 149* 150*  K 3.8 3.7 3.4*  CL 117* 116* 117*  CO2 19* 19* 19*  BUN 102* 147* 163*  CREATININE 1.62* 1.98* 1.98*  GLUCOSE 83 104* 109*    Electrolytes  Recent Labs Lab 02/02/16 0440 02/03/16 0415 02/04/16 0515 02/05/16 0347  CALCIUM 11.5* 9.1 9.4 9.0  MG 1.7  --   --   --   PHOS 5.8*  --   --   --    Sepsis Markers  Recent Labs Lab 02/21/2016 1936  LATICACIDVEN 7.93*   ABG  Recent Labs Lab 01/31/2016 1929 02/02/16 0327  PHART 7.424 7.490*  PCO2ART 41.4 37.3  PO2ART 371.0* 101.0*   Liver Enzymes No results for input(s): AST, ALT, ALKPHOS, BILITOT, ALBUMIN in the last 168 hours. Cardiac Enzymes No results for input(s): TROPONINI, PROBNP in the last 168 hours. Glucose  Recent Labs Lab 02/04/16 1647 02/04/16 2013 02/05/16 0022 02/05/16 0345 02/05/16 0803 02/05/16 1116  GLUCAP 111* 96 107* 111* 112* 137*    Imaging No results found.  EKG: Sinus rhythm CXR: R mainstem ETT, no infiltrate. Elev. R hemidiaphragm.   ASSESSMENT / PLAN:  Active Problems:   Cardiac arrest ([place])   Pressure ulcer   Acute respiratory failure (HCC)   PULMONARY A: Aspiration leading to hypoxia, then cardiac arrest. Need for mechanical ventilation P:   Full vent support-PRVC 10, 340,PEEP 5, 30% Reintubation would be futile and will NOT be offered Suction secretions  CARDIOVASCULAR A: S/p cardiac arrest, suspected etiology of hypoxia P:   Tele crp , acls would be futile and medically ineffective  RENAL A: Acute on chronic renal failure Hyperkalemia hypernatremia P:   d5w, k supp Monitor uop   GASTROINTESTINAL A: Marked malnutrition P:   Ppi Monitor BM  HEMATOLOGIC A: Anemia dvt prevention P:   scd limit phlebotomy  INFECTIOUS A: Possible LLL aspiration PNA P:   ceftraixone , total 7 days then allow this to dc  ENDOCRINE A: Hypoglycemia-resolved P:    NEUROLOGIC A: Dementia terminal  P:   Administer morphine q1hr as needed for pain or dyspnea Consider restarting morphine drip if prn morphine is not sufficient in maintaining pt comfort   Prognosis and quality of life  poor she requires morphine and comfort discussed with pts daughter plan of care she states she is still in agreement with comfort care measures today. Daughter inquired about restarting tube feeds informed daughter I did not agree with restarting tube feeds due to risk of aspiration during terminal extubation.  However, she is unsure of a specific time when the terminal extubation will occur.  She is waiting for other family members to arrive.  Instructed daughter to notify nursing staff once all family arrived at bedside  Marda Stalker, [place]

## 2016-02-06 NOTE — Telephone Encounter (Signed)
TC received from patient's daughter requesting re-referral for hospice services. Daughter had revoked services yesterday and pt was subsequently admitted to ICU, intubated and on vent. Pt apparently now has DNR status.

## 2016-02-07 LAB — CULTURE, BLOOD (ROUTINE X 2)
CULTURE: NO GROWTH
Culture: NO GROWTH

## 2016-02-07 MED ORDER — FENTANYL BOLUS VIA INFUSION
25.0000 ug | INTRAVENOUS | Status: DC | PRN
  Filled 2016-02-07: qty 25

## 2016-02-07 MED ORDER — FENTANYL CITRATE (PF) 100 MCG/2ML IJ SOLN
50.0000 ug | Freq: Once | INTRAMUSCULAR | Status: DC
Start: 2016-02-07 — End: 2016-02-09

## 2016-02-07 MED ORDER — SODIUM CHLORIDE 0.9 % IV SOLN
25.0000 ug/h | INTRAVENOUS | Status: DC
  Filled 2016-02-07: qty 50

## 2016-02-07 MED ORDER — FENTANYL CITRATE (PF) 100 MCG/2ML IJ SOLN
25.0000 ug | INTRAMUSCULAR | Status: DC | PRN
Start: 2016-02-07 — End: 2016-02-09

## 2016-02-07 NOTE — Plan of Care (Signed)
Fentanyl gtt not hung on patient at this time per daughters request. Drip given to [facility] during handoff

## 2016-02-07 NOTE — Progress Notes (Signed)
Chaplain responded to the request by the Nursing Unit to provide spiritual care support for the family as the patient is removed from the ventilator for withdrawal of life support. The family is present and stayed as all  The withdrawal was completed.  Chaplain offered a prayer of comfort and peace for the family. Chaplain follow up will be provided as needed. Chaplain Yaakov Guthrie [phone]

## 2016-02-07 NOTE — Telephone Encounter (Signed)
Victoria [name], Victoria [name] has communicated with hospice

## 2016-02-07 NOTE — Telephone Encounter (Signed)
Call from Hospice yesterday states daughter is requesting Hospice services again.  They ask if Dr. Alvy Bimler willing to be the Attending MD?   I informed Hospice Dr. Alvy Bimler has declined to be the Hospice MD for pt due to conflict of goals of care with pt's daughter.   Dr. Alvy Bimler suggests Hospice MD or pt's PCP be the Attending for Hospice if pt is d/c'd back home.

## 2016-02-07 NOTE — Progress Notes (Signed)
Responded to spiritual care  consult to provide support to patient family at bedside. Patient is failing to stride and family is planning withdraw life support at [facility] per nurse. During visit daughter indicate that they were OK and had already call their church and would be supported by them. I offered Chaplain services if needed.  Supported Biochemist, clinical.  Will follow as needed.   02/07/16 0900  Clinical Encounter Type  Visited With Patient and family together;Health care provider  Visit Type Initial;Spiritual support;Patient actively dying  Referral From Nurse  Spiritual Encounters  Spiritual Needs Emotional;Grief support  Stress Factors  Family Stress Factors [name] life changes  Victoria [name], [place]

## 2016-02-07 NOTE — Plan of Care (Signed)
Problem: Activity: Goal: Ability to tolerate increased activity will improve Outcome: Not Progressing Pt extremities unresponsive.   Problem: Nutritional: Goal: Intake of prescribed amount of daily calories will improve Outcome: Not Progressing Patient nutrition stopped.  Problem: Role Relationship: Goal: Method of communication will improve Outcome: Not Progressing Patient unable to communicate.  Problem: Skin Integrity Impairment Risk: Goal: Skin integrity will improve Outcome: Not Progressing Patient turned every two hours but sacral and shoulder pressure ulcers remain.

## 2016-02-07 NOTE — Progress Notes (Signed)
Rounding on patient and family has taken place hourly on my shift. Daughter and her spouse at bedside. Daughter with concerns as to why I keep checking on her mother. I informed her that staff likes to make sure the patients needs are met, including mouth care, turning and to provide skin checks to ensure the patients skin integrity is maintained. Daughter is also asking what is next, since she states the doctor told her that her mother would pass quickly and she has not. She wants to know what "brain scans" or "feeding" might be started. I informed her that every person/case is different and its hard to know when a person would pass. Further education with the family is needed when physician rounding is done tomorrow.

## 2016-02-07 NOTE — Progress Notes (Signed)
Victoria [name] was terminally extubated earlier today. I was called to the bedside as the daughter Victoria [name] has become increasingly concerned as to why the patient has not passed away quickly. Since her mother still alive she wants to initiate feeding, get brain scans check neural activity and she referred to the Derrill Kay case to the nursing staff. She has already called the rapid response team and the code team from the bedside by herself.  I explained that it is hard to predict when her mother would pass away as each patient has a different course. Our goal remains to keep the patient as comfortable as possible. I reiterated that we would not reverse her current code status from comfort measures or initiate feeds. The daughter is not happy with this explanation but is accepting of it now.  The patient will be moved from the ICU to 6N for greater family privacy and patient comfort.  Marshell Garfinkel MD [facility] Pager [phone] If no answer or after 3pm call: [phone] 02/07/2016, 6:50 PM

## 2016-02-07 NOTE — Progress Notes (Addendum)
PULMONARY / CRITICAL CARE MEDICINE HISTORY AND PHYSICAL EXAMINATION   Name: Victoria [name] MRN: [id number] DOB: October 27, 1931    ADMISSION DATE:  01/31/2016  PRIMARY SERVICE: PCCM  CHIEF COMPLAINT:  Cardiac arrest  BRIEF PATIENT DESCRIPTION: 82 y/o woman with dementia, MM (oncology recommended full comfort), FTT, on hospice presenting w/ respiratory -> Cardiac arrest 2/2 aspiration  SIGNIFICANT EVENTS / STUDIES:  - Bradycardia -> arrest in field, ROSC obtained after 6 mins, epi x1 - Intubated in ED -remains agitated   LINES / TUBES: - ETT 5/5 - OGT 5/5 - Portacath (Oct '16)  CULTURES: Blood 5/5>>no growth 5/9 MRSA PCR 5/6>>positive  ANTIBIOTICS: Vanc 5/5>> Cefepime 5/6>> Zosyn  5/5 >> 5/6  HISTORY OF PRESENT ILLNESS:   Victoria [name] is a 82 y/o woman with advanced dementia, MM, and failure to thrive who presents to the ED via EMS after a cardiac arrest at home. The patient's daughter and caregiver report that over the past week her functional status has declined, and has remained bed ridden for the last day or so. Yesterday she was unable to take food by mouth, and when she tried to swallow soft foods, they didn't go down, and came out of her mouth (without a cough) after a minute or so. Today her family tried to give her liquids (broths, ensure, etc) but she didn't swallow much. She was minimally responsive to family. She made some gurgling sounds, and then later was found to be apnic. EMS was called, and the family member began rescue breathing. Upon arrival, the patient was in a wide complex rhythm, and then developed bradycardia and then PEA. CPR was begun, and after six minutes of CPR with epi x1, ROSC was obtained. She was taken to the ED and PCCM was called for admission.  SUBJECTIVE / interval events:  Pt responds to pain only.  No improvement in mental status after morphine was held.   VITAL SIGNS: Temp:  [98.6 F (37 C)-100.2 F (37.9 C)] 98.6 F (37 C) (05/11 0600) Pulse  Rate:  [59-72] 60 (05/11 0759) Resp:  [7-15] 11 (05/11 0759) BP: (78-96)/(42-52) 88/46 mmHg (05/11 0759) SpO2:  [99 %-100 %] 100 % (05/11 0800) FiO2 (%):  [30 %-40 %] 30 % (05/11 0800) Weight:  [82 lb 3.7 oz (37.3 kg)] 82 lb 3.7 oz (37.3 kg) (05/11 0500) HEMODYNAMICS:   VENTILATOR SETTINGS: Vent Mode:  [-] PRVC FiO2 (%):  [30 %-40 %] 30 % Set Rate:  [10 bmp] 10 bmp Vt Set:  [340 mL] 340 mL PEEP:  [5 cmH20] 5 cmH20 Plateau Pressure:  [6 cmH20-15 cmH20] 14 cmH20 INTAKE / OUTPUT: Intake/Output      05/10 0701 - 05/11 0700 05/11 0701 - 05/12 0700   P.O.     I.V. (mL/kg) 260 (7)    NG/GT     Total Intake(mL/kg) 260 (7)    Urine (mL/kg/hr) 193 (0.2)    Stool     Total Output 193     Net +67            PHYSICAL EXAMINATION: General:  Extremely malnourished woman. Neuro:  Does not follow commands, Minimal pupillary response.  HEENT:  Dry MM Neck: ETT in place Cardiovascular:  S1S2, RRR,  Lungs: Clear, no wheeze or crackles Abdomen:  Soft, + BS Musculoskeletal:  Extremely reduced muscle bulk Skin:  No rashes  LABS:  CBC  Recent Labs Lab 02/03/16 1248 02/04/16 0515 02/05/16 0350  WBC 7.4 7.2 4.3  HGB 7.6* 7.7* 7.6*  HCT 24.8* 24.5* 24.9*  PLT 233 209 199   Coag's  Recent Labs Lab 01/28/2016 1924  APTT 30  INR 1.64*   BMET  Recent Labs Lab 02/03/16 0415 02/04/16 0515 02/05/16 0347  NA 145 149* 150*  K 3.8 3.7 3.4*  CL 117* 116* 117*  CO2 19* 19* 19*  BUN 102* 147* 163*  CREATININE 1.62* 1.98* 1.98*  GLUCOSE 83 104* 109*   Electrolytes  Recent Labs Lab 02/02/16 0440 02/03/16 0415 02/04/16 0515 02/05/16 0347  CALCIUM 11.5* 9.1 9.4 9.0  MG 1.7  --   --   --   PHOS 5.8*  --   --   --    Sepsis Markers  Recent Labs Lab 02/19/2016 1936  LATICACIDVEN 7.93*   ABG  Recent Labs Lab 02/13/2016 1929 02/02/16 0327  PHART 7.424 7.490*  PCO2ART 41.4 37.3  PO2ART 371.0* 101.0*   Liver Enzymes No results for input(s): AST, ALT, ALKPHOS,  BILITOT, ALBUMIN in the last 168 hours. Cardiac Enzymes No results for input(s): TROPONINI, PROBNP in the last 168 hours. Glucose  Recent Labs Lab 02/04/16 1647 02/04/16 2013 02/05/16 0022 02/05/16 0345 02/05/16 0803 02/05/16 1116  GLUCAP 111* 96 107* 111* 112* 137*    Imaging No results found.   EKG: Sinus rhythm CXR: No new imaging  ASSESSMENT / PLAN:  Active Problems:   Cardiac arrest (HCC)   Pressure ulcer   Acute respiratory failure (HCC)   PULMONARY A: Aspiration leading to hypoxia, then cardiac arrest. Need for mechanical ventilation P:   Full vent support Terminal extubation today  CARDIOVASCULAR A: S/p cardiac arrest, suspected etiology of hypoxia P:   Tele DNR  RENAL A: Acute on chronic renal failure Hyperkalemia hypernatremia P:   Stable urine output.   GASTROINTESTINAL A: Marked malnutrition P:    HEMATOLOGIC A: Anemia dvt prevention P:   SCDs  INFECTIOUS A: Possible LLL aspiration PNA P:   Will D/C antibiotics  ENDOCRINE A: Hypoglycemia-resolved P:    NEUROLOGIC A: Dementia terminal P:   Administer fentanyl q1hr as needed for pain or dyspnea, use fentanyl drip if needed   Interdisciplinary Goals of Care Family Meeting Date carried out:: 02/07/2016 Location of the meeting: Bedside   Member's involved: Physician, Bedside Registered Nurse, Family Member or next of kin and Other: daughter who is DPOA.  Durable Power of Tour manager: Daughter who is the DOPA and brother, grandson were in the room.   Discussion: We re discussed goals of care for [facility] . I agree with Dr. Janit Pagan assessment that further care is futile. I addressed the daughter's concerns that the morphine was making mother unresponsive. She was upset that the morphine drip was not titrated down yesterday. Now that her mom has been off the drip for 24 hrs, and mental status has not changed, she feels better. We will  proceed with the terminal extubation at 10:30 am. I will change the morphine order to fentanyl PRN and drip if needed as the daughter is still not comfortable with morphine.  Code status: Limited Code or DNR with short term. Plan for terminal extubation today.  Disposition: In-patient comfort care  Time spent for the meeting: 25 mins  Total critical care time- 35 mins.   Marshell Garfinkel MD  Pulmonary and Critical Care Pager [phone] If no answer or after 3pm call: [phone] 02/07/2016, 8:50 AM

## 2016-02-07 NOTE — Progress Notes (Signed)
Pt extubated per order using withdrawal protocol.  RN at bedside.

## 2016-02-07 NOTE — Progress Notes (Signed)
   02/07/16 2002  Vitals  Temp 98.2 F (36.8 C)  BP (!) 107/59 mmHg  MAP (mmHg) 73  Pulse Rate 67  ECG Heart Rate 69  Resp (!) 8  Oxygen Therapy  SpO2 100 %  vital signs prior to transfer to 6N16. Report given to receiving RN. Family aware and at bedside.

## 2016-02-09 NOTE — Progress Notes (Signed)
Pt been picked up by the [employer] funeral home personnel

## 2016-02-12 ENCOUNTER — Telehealth: Payer: Self-pay

## 2016-02-12 NOTE — Telephone Encounter (Signed)
On 02/12/2016 I received a death certificate from [place] (original). The death certificate is for burial. The patient is a patient of Doctor Maneem. The death certificate will be taken to [facility]) tomorrow am for signature. On 03/03/2016 I received the death certificate back from Doctor Maneem. I got the death certificate ready and called the funeral home to let them know the death certificate is ready for pickup.

## 2016-02-27 NOTE — Discharge Summary (Signed)
Name:Victoria [name] [id number] DOB:15-May-1932   ADMISSION DATE: 03-02-2016 DATE OF DEATH: 2016/03/09  [name] is a 82 y/o woman with advanced dementia, MM, and failure to thrive who presents to the ED via EMS after a cardiac arrest at home. The patient's daughter and caregiver report that over the past week prior to admission her functional status has declined, and has remained bed ridden for the last day or so. She was unable to take food by mouth, and when she tried to swallow soft foods, they didn't go down, and came out of her mouth (without a cough) after a minute or so. Her family tried to give her liquids (broths, ensure, etc) but she didn't swallow much. She was minimally responsive to family. She made some gurgling sounds, and then later was found to be apneic. EMS was called, and the family member began rescue breathing. Upon arrival, the patient was in a wide complex rhythm, and then developed bradycardia and then PEA. CPR was begun, and after six minutes of CPR with epi x1, ROSC was obtained. She was taken to the ED and PCCM was called for admission.  She was on hospice prior to admission but the family decided to reverse the code status and wanted everything done initially. Over the course of her hospital stay she was treated for aspiration with a course of antibiotics. Her mental status did not improve even after sedation was held. Multiple family discussions were held with the daughter and son. The decision was made initially to make her DNR and then she was transitioned to comfort care with extubation on 5/11.   Marshell Garfinkel MD [place] [facility] Pager [phone] If no answer or after 3pm call: [phone] 02/27/2016, 1:06 AM

## 2016-02-28 NOTE — Progress Notes (Signed)
Pt now showing signs of deterioration from this morning 's condition. respirations more shallow and fewer in between. Pt however not showing any signs of pain or discomfort. Daughter and her husband at bedside

## 2016-02-28 NOTE — Progress Notes (Signed)
Pt resting in bed with eyes closed with no signs of pain noted. Sacral foam changed and pt provided with mouthcare as ordered.

## 2016-02-28 NOTE — Progress Notes (Signed)
PULMONARY / CRITICAL CARE MEDICINE HISTORY AND PHYSICAL EXAMINATION   Name: Victoria [name] MRN: [id number] DOB: Mar 29, 1932    ADMISSION DATE:  01/31/2016  PRIMARY SERVICE: PCCM  CHIEF COMPLAINT:  Cardiac arrest  BRIEF PATIENT DESCRIPTION: 82 y/o woman with dementia, MM (oncology recommended full comfort), FTT, on hospice presenting w/ respiratory -> Cardiac arrest 2/2 aspiration  SIGNIFICANT EVENTS / STUDIES:  Terminal extubation on 02-20-2023. Comfort care. Transferred to 6N  LINES / TUBES: - ETT 5/5 >> 02-20-23 - OGT 5/5 >> 5/10 - Portacath (Oct '16)  CULTURES: Blood 5/5>>no growth 5/9 MRSA PCR 5/6>>positive  ANTIBIOTICS: Vanc 5/5>> 5/7 Cefepime 5/6>> 5/8 Zosyn  5/5 >> 5/6 Ceftriaxone 5/6 >> 2023/02/20  HISTORY OF PRESENT ILLNESS:   Victoria [name] is a 82 y/o woman with advanced dementia, MM, and failure to thrive who presents to the ED via EMS after a cardiac arrest at home. The patient's daughter and caregiver report that over the past week her functional status has declined, and has remained bed ridden for the last day or so. Yesterday she was unable to take food by mouth, and when she tried to swallow soft foods, they didn't go down, and came out of her mouth (without a cough) after a minute or so. Today her family tried to give her liquids (broths, ensure, etc) but she didn't swallow much. She was minimally responsive to family. She made some gurgling sounds, and then later was found to be apnic. EMS was called, and the family member began rescue breathing. Upon arrival, the patient was in a wide complex rhythm, and then developed bradycardia and then PEA. CPR was begun, and after six minutes of CPR with epi x1, ROSC was obtained. She was taken to the ED and PCCM was called for admission.  SUBJECTIVE / interval events:  Pt responds to pain only. Still unresponsive.  VITAL SIGNS: Temp:  [97.8 F (36.6 C)-98.4 F (36.9 C)] 98.1 F (36.7 C) (05/12 [id number]) Pulse Rate:  [59-71] 59 (05/12  0613) Resp:  [6-15] 12 (05/12 0613) BP: (104-114)/(49-59) 104/49 mmHg (05/12 0613) SpO2:  [100 %] 100 % (05/12 [id number]) Weight:  [79 lb 2.3 oz (35.9 kg)] 79 lb 2.3 oz (35.9 kg) 02/20/23 2054) HEMODYNAMICS:   VENTILATOR SETTINGS:   INTAKE / OUTPUT: Intake/Output      02-20-23 0701 - 05/12 0700 05/12 0701 - 05/13 0700   P.O. 0    I.V. (mL/kg) 110 (3.1)    Total Intake(mL/kg) 110 (3.1)    Urine (mL/kg/hr) 500 (0.6)    Total Output 500     Net -390            PHYSICAL EXAMINATION: General:  Extremely malnourished woman. Neuro:  Does not follow commands, Minimal pupillary response.  HEENT:  Dry MM Neck: Supple, No JVD Cardiovascular:  S1S2, RRR,  Lungs: Clear, no wheeze or crackles Abdomen:  Soft, + BS Musculoskeletal:  Extremely reduced muscle bulk Skin:  No rashes  LABS:  CBC  Recent Labs Lab 02/03/16 1248 02/04/16 0515 02/05/16 0350  WBC 7.4 7.2 4.3  HGB 7.6* 7.7* 7.6*  HCT 24.8* 24.5* 24.9*  PLT 233 209 199   Coag's  Recent Labs Lab 02/07/2016 1924  APTT 30  INR 1.64*   BMET  Recent Labs Lab 02/03/16 0415 02/04/16 0515 02/05/16 0347  NA 145 149* 150*  K 3.8 3.7 3.4*  CL 117* 116* 117*  CO2 19* 19* 19*  BUN 102* 147* 163*  CREATININE 1.62* 1.98* 1.98*  GLUCOSE  83 104* 109*   Electrolytes  Recent Labs Lab 02/02/16 0440 02/03/16 0415 02/04/16 0515 02/05/16 0347  CALCIUM 11.5* 9.1 9.4 9.0  MG 1.7  --   --   --   PHOS 5.8*  --   --   --    Sepsis Markers  Recent Labs Lab 02/25/2016 1936  LATICACIDVEN 7.93*   ABG  Recent Labs Lab 02/13/2016 1929 02/02/16 0327  PHART 7.424 7.490*  PCO2ART 41.4 37.3  PO2ART 371.0* 101.0*   Liver Enzymes No results for input(s): AST, ALT, ALKPHOS, BILITOT, ALBUMIN in the last 168 hours. Cardiac Enzymes No results for input(s): TROPONINI, PROBNP in the last 168 hours. Glucose  Recent Labs Lab 02/04/16 1647 02/04/16 2013 02/05/16 0022 02/05/16 0345 02/05/16 0803 02/05/16 1116  GLUCAP 111* 96 107*  111* 112* 137*    Imaging No results found.   CXR: No new imaging  ASSESSMENT / PLAN:  Active Problems:   Cardiac arrest ([place])   Pressure ulcer   Acute respiratory failure (HCC)   PULMONARY A: Aspiration leading to hypoxia, then cardiac arrest. Need for mechanical ventilation P:   Comfort care. O2 for comfort.   CARDIOVASCULAR A: S/p cardiac arrest, suspected etiology of hypoxia P:   DNR  RENAL A: Acute on chronic renal failure Hyperkalemia hypernatremia P:   Stable urine output.   GASTROINTESTINAL A: Marked malnutrition P:    HEMATOLOGIC A: Anemia dvt prevention P:   SCDs  INFECTIOUS A: Possible LLL aspiration PNA P:   Off all antibiotics  ENDOCRINE A: Hypoglycemia-resolved P:    NEUROLOGIC A: Dementia terminal P:   Administer fentanyl q1hr as needed for pain or dyspnea, use fentanyl drip if needed  Family meeting: 5/12 - Reviewed Victoria [name] status and care plan with the daughter, Ebony Hail. She has no new concerns or questions. Her mother appears comfortable. We discussed getting the palliative care service formally involved but decided to hold off as they would not do anything different that what we are doing now.  Marshell Garfinkel MD [place] [facility] Pager [phone] If no answer or after 3pm call: [phone] 03/03/2016, 10:27 AM

## 2016-02-28 DEATH — deceased

## 2017-05-04 IMAGING — NM NM MISC PROCEDURE
6 series · 36 of 36 positions shown · non-contrast
Comparison: none

[Series 1: wbr_r-proj_st wbr rest · 6.40mm/px · 6 of 64 frames shown]
[frame 6/64]
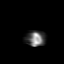
[frame 16/64]
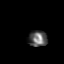
[frame 27/64]
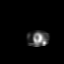
[frame 38/64]
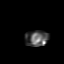
[frame 48/64]
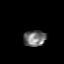
[frame 59/64]
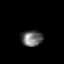

[Series 1: wbr rest · 6.40mm/px · 6 of 64 frames shown]
[frame 6/64]
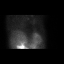
[frame 16/64]
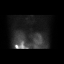
[frame 27/64]
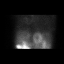
[frame 38/64]
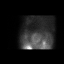
[frame 48/64]
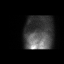
[frame 59/64]
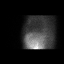

[Series 2: wbr stress-gsp · 6.40mm/px · 6 of 512 frames shown]
[frame 43/512]
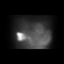
[frame 128/512]
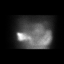
[frame 214/512]
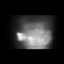
[frame 299/512]
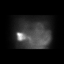
[frame 384/512]
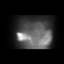
[frame 470/512]
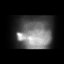

[Series 2: wbr_s-proj_st wbr stress-gsp · 6.40mm/px · 6 of 512 frames shown]
[frame 43/512]
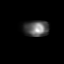
[frame 128/512]
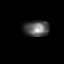
[frame 214/512]
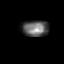
[frame 299/512]
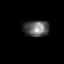
[frame 384/512]
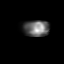
[frame 470/512]
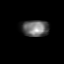

[Series 3: wbr_s-proj_st wbr stress-sum-em · 6.40mm/px · 6 of 64 frames shown]
[frame 6/64]
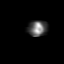
[frame 16/64]
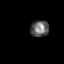
[frame 27/64]
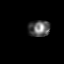
[frame 38/64]
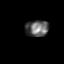
[frame 48/64]
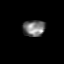
[frame 59/64]
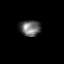

[Series 3: wbr stress-sum-em · 6.40mm/px · 6 of 64 frames shown]
[frame 6/64]
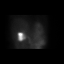
[frame 16/64]
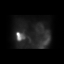
[frame 27/64]
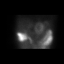
[frame 38/64]
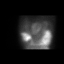
[frame 48/64]
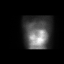
[frame 59/64]
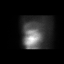

[36 of 36 positions shown; findings below may reference images not displayed]

Canned report from images found in remote index.

Refer to host system for actual result text.

## 2017-07-25 IMAGING — XA IR FLUORO GUIDE CV LINE*R*
1 series · 3 of 3 positions shown · non-contrast
Comparison: none

CLINICAL DATA: Multiple myeloma

[Series 300: line placements · 3 of 3 slices shown]
[im 1/3]
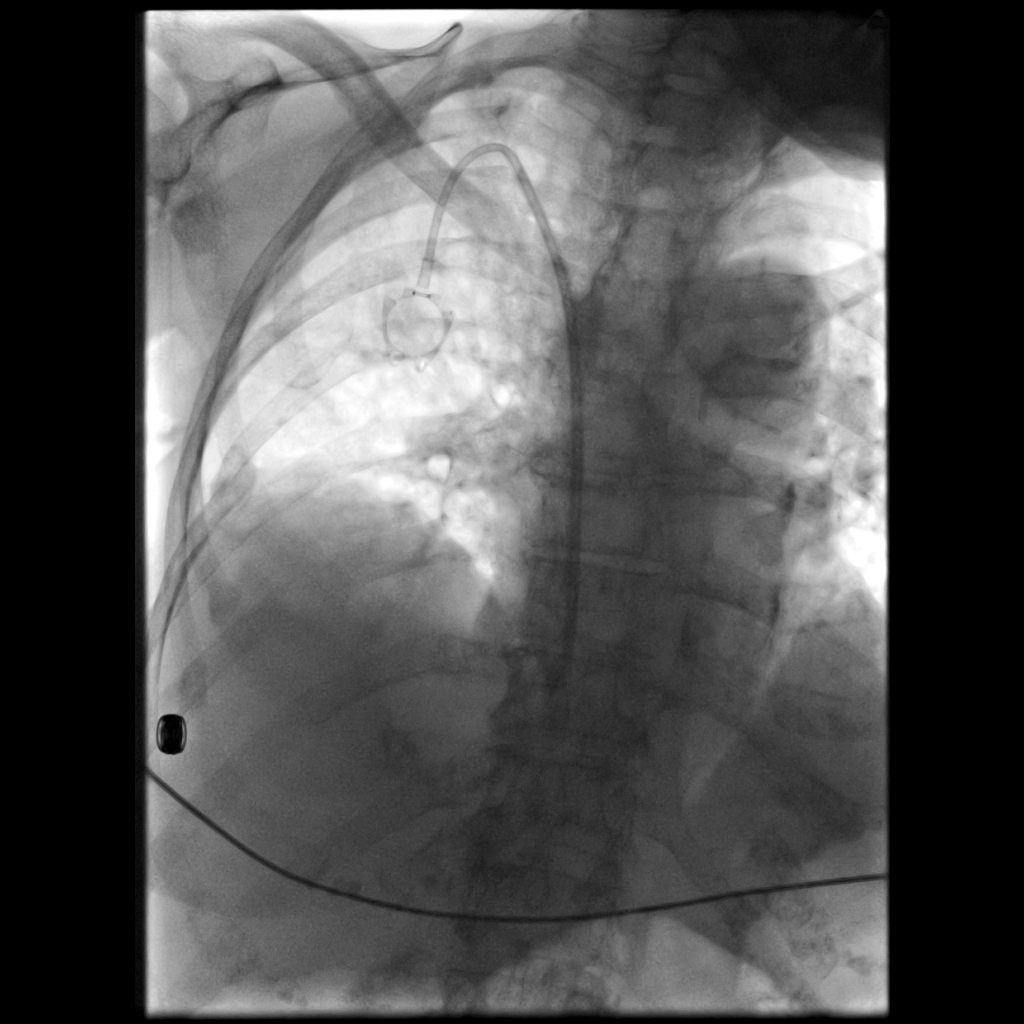
[im 2/3]
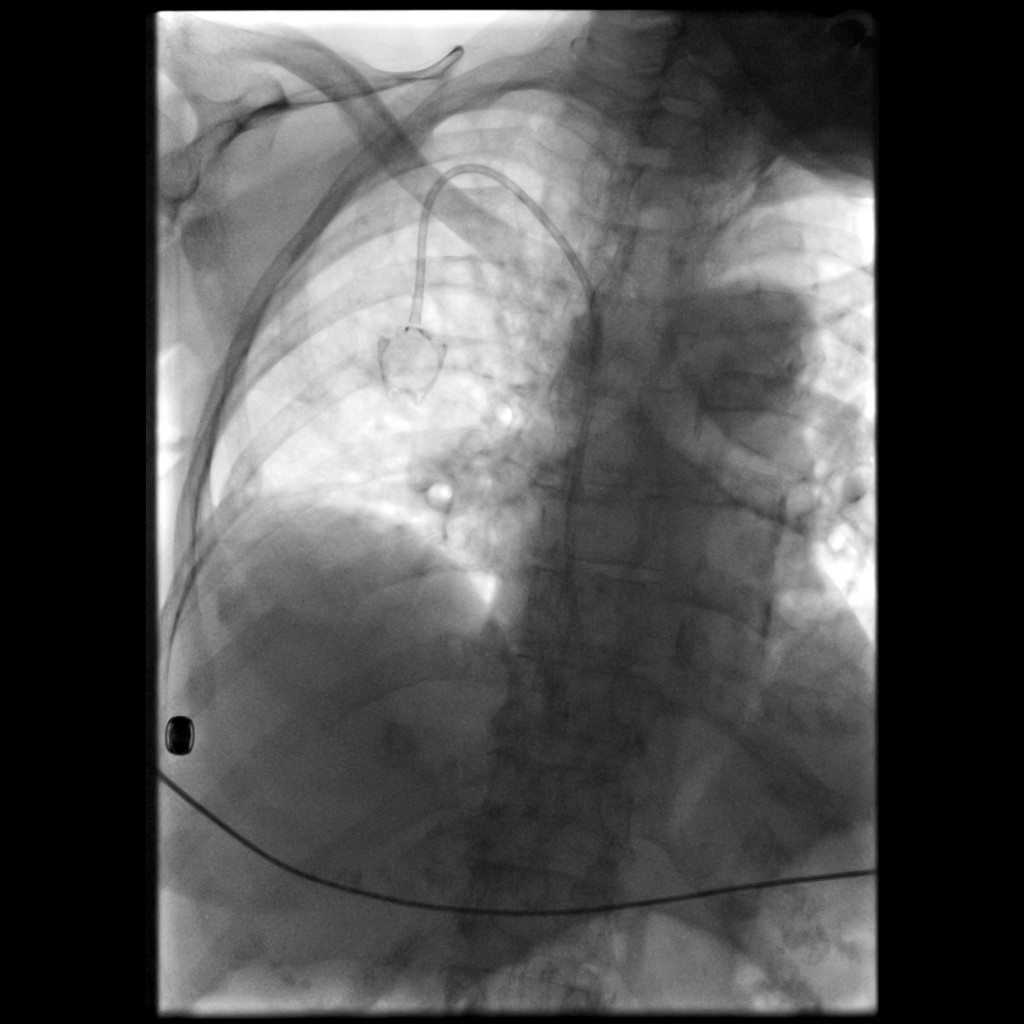
[im 3/3]
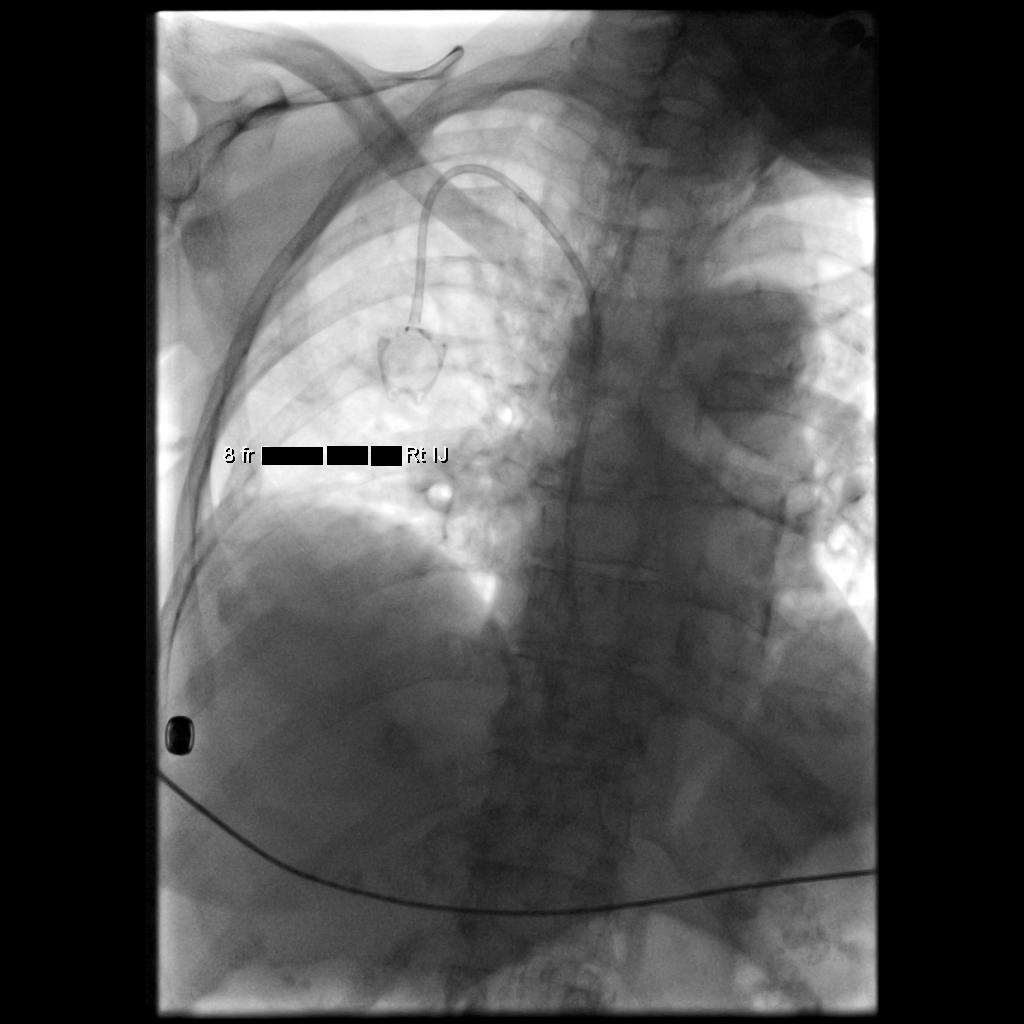

[3 of 3 positions shown; findings below may reference images not displayed]

EXAM:
RIGHT IJ SINGLE LUMEN POWER PORT CATHETER INSERTION

Date:  [DATE] [DATE]

Radiologist:  Neitola, [place]

Guidance:  ULTRASOUND AND FLUOROSCOPIC

FLUOROSCOPY TIME:  42 SECONDS, 3 MGY minutes

MEDICATIONS AND MEDICAL HISTORY:
1 G VANCOMYCINadministered within 1 hour of the procedure.0.5 MG
VERSED, 25 MCG FENTANYL

ANESTHESIA/SEDATION:
32 minutes

CONTRAST:  None.

COMPLICATIONS:
None immediate

PROCEDURE:
Informed consent was obtained from the patient following explanation
of the procedure, risks, benefits and alternatives. The patient
understands, agrees and consents for the procedure. All questions
were addressed. A time out was performed.

Maximal barrier sterile technique utilized including caps, mask,
sterile gowns, sterile gloves, large sterile drape, hand hygiene,
and 2% chlorhexidine scrub.

Under sterile conditions and local anesthesia, right internal
jugular micropuncture venous access was performed. Access was
performed with ultrasound. Images were obtained for documentation. A
guide wire was inserted followed by a transitional dilator. This
allowed insertion of a guide wire and catheter into the IVC.
Measurements were obtained from the SVC / RA junction back to the
right IJ venotomy site. In the right infraclavicular chest, a
subcutaneous pocket was created over the second anterior rib. This
was done under sterile conditions and local anesthesia. 1% lidocaine
with epinephrine was utilized for this. A 2.5 cm incision was made
in the skin. Blunt dissection was performed to create a subcutaneous
pocket over the right pectoralis major muscle. The pocket was
flushed with saline vigorously. There was adequate hemostasis. The
port catheter was assembled and checked for leakage. The port
catheter was secured in the pocket with two retention sutures. The
tubing was tunneled subcutaneously to the right venotomy site and
inserted into the SVC/RA junction through a valved peel-away sheath.
Position was confirmed with fluoroscopy. Images were obtained for
documentation. The patient tolerated the procedure well. No
immediate complications. Incisions were closed in a two layer
fashion with 4 - 0 Vicryl suture. Dermabond was applied to the skin.
The port catheter was accessed, blood was aspirated followed by
saline and heparin flushes. Needle was removed. A dry sterile
dressing was applied.
IMPRESSION: Ultrasound and fluoroscopically guided right internal jugular single
lumen power port catheter insertion. Tip in the SVC/RA junction.
Catheter ready for use.
